# Patient Record
Sex: Male | Born: 1940 | Race: White | Hispanic: No | Marital: Married | Smoking: Never smoker
Health system: Southern US, Community
[De-identification: ages and names within clinical notes are randomized; demographics above are authoritative.]

## PROBLEM LIST (undated history)

## (undated) DIAGNOSIS — E7800 Pure hypercholesterolemia, unspecified: Secondary | ICD-10-CM

## (undated) DIAGNOSIS — L97921 Non-pressure chronic ulcer of unspecified part of left lower leg limited to breakdown of skin: Secondary | ICD-10-CM

## (undated) DIAGNOSIS — S81802D Unspecified open wound, left lower leg, subsequent encounter: Secondary | ICD-10-CM

## (undated) DIAGNOSIS — Z9289 Personal history of other medical treatment: Secondary | ICD-10-CM

## (undated) DIAGNOSIS — R609 Edema, unspecified: Secondary | ICD-10-CM

## (undated) DIAGNOSIS — I447 Left bundle-branch block, unspecified: Secondary | ICD-10-CM

## (undated) DIAGNOSIS — I10 Essential (primary) hypertension: Secondary | ICD-10-CM

## (undated) DIAGNOSIS — M545 Low back pain, unspecified: Secondary | ICD-10-CM

## (undated) DIAGNOSIS — M51369 Other intervertebral disc degeneration, lumbar region without mention of lumbar back pain or lower extremity pain: Secondary | ICD-10-CM

## (undated) DIAGNOSIS — I872 Venous insufficiency (chronic) (peripheral): Secondary | ICD-10-CM

## (undated) DIAGNOSIS — I4891 Unspecified atrial fibrillation: Secondary | ICD-10-CM

## (undated) DIAGNOSIS — IMO0001 Reserved for inherently not codable concepts without codable children: Secondary | ICD-10-CM

## (undated) DIAGNOSIS — N5082 Scrotal pain: Principal | ICD-10-CM

## (undated) DIAGNOSIS — M479 Spondylosis, unspecified: Secondary | ICD-10-CM

## (undated) DIAGNOSIS — M5136 Other intervertebral disc degeneration, lumbar region: Secondary | ICD-10-CM

## (undated) DIAGNOSIS — C801 Malignant (primary) neoplasm, unspecified: Secondary | ICD-10-CM

## (undated) DIAGNOSIS — I472 Ventricular tachycardia: Secondary | ICD-10-CM

## (undated) DIAGNOSIS — I2510 Atherosclerotic heart disease of native coronary artery without angina pectoris: Secondary | ICD-10-CM

## (undated) DIAGNOSIS — G4733 Obstructive sleep apnea (adult) (pediatric): Secondary | ICD-10-CM

## (undated) DIAGNOSIS — IMO0002 Reserved for concepts with insufficient information to code with codable children: Secondary | ICD-10-CM

## (undated) DIAGNOSIS — K5730 Diverticulosis of large intestine without perforation or abscess without bleeding: Secondary | ICD-10-CM

## (undated) DIAGNOSIS — K649 Unspecified hemorrhoids: Secondary | ICD-10-CM

## (undated) DIAGNOSIS — D1800 Hemangioma unspecified site: Secondary | ICD-10-CM

## (undated) DIAGNOSIS — D126 Benign neoplasm of colon, unspecified: Secondary | ICD-10-CM

## (undated) DIAGNOSIS — R05 Cough: Secondary | ICD-10-CM

## (undated) DIAGNOSIS — I679 Cerebrovascular disease, unspecified: Secondary | ICD-10-CM

## (undated) HISTORY — DX: Benign neoplasm of colon, unspecified: D12.6

## (undated) HISTORY — DX: Other intervertebral disc degeneration, lumbar region: M51.36

## (undated) HISTORY — DX: Edema, unspecified: R60.9

## (undated) HISTORY — DX: Pure hypercholesterolemia, unspecified: E78.00

## (undated) HISTORY — DX: Obstructive sleep apnea (adult) (pediatric): G47.33

## (undated) HISTORY — DX: Other intervertebral disc degeneration, lumbar region without mention of lumbar back pain or lower extremity pain: M51.369

## (undated) HISTORY — DX: Non-pressure chronic ulcer of unspecified part of left lower leg limited to breakdown of skin: L97.921

## (undated) HISTORY — DX: Low back pain: M54.5

## (undated) HISTORY — DX: Unspecified atrial fibrillation: I48.91

## (undated) HISTORY — DX: Scrotal pain: N50.82

## (undated) HISTORY — DX: Unspecified open wound, left lower leg, subsequent encounter: S81.802D

## (undated) HISTORY — DX: Cerebrovascular disease, unspecified: I67.9

## (undated) HISTORY — DX: Personal history of other medical treatment: Z92.89

## (undated) HISTORY — PX: COLONOSCOPY W/ BIOPSIES: SHX1374

## (undated) HISTORY — DX: Unspecified hemorrhoids: K64.9

## (undated) HISTORY — DX: Essential (primary) hypertension: I10

## (undated) HISTORY — PX: KNEE ARTHROSCOPY: SHX127

## (undated) HISTORY — DX: Cough: R05

## (undated) HISTORY — DX: Venous insufficiency (chronic) (peripheral): I87.2

## (undated) HISTORY — DX: Hemangioma unspecified site: D18.00

## (undated) HISTORY — DX: Diverticulosis of large intestine without perforation or abscess without bleeding: K57.30

## (undated) HISTORY — DX: Left bundle-branch block, unspecified: I44.7

## (undated) HISTORY — DX: Spondylosis, unspecified: M47.9

## (undated) HISTORY — DX: Atherosclerotic heart disease of native coronary artery without angina pectoris: I25.10

## (undated) HISTORY — DX: Ventricular tachycardia: I47.2

## (undated) HISTORY — DX: Reserved for concepts with insufficient information to code with codable children: IMO0002

## (undated) HISTORY — DX: Low back pain, unspecified: M54.50

---

## 2001-02-21 ENCOUNTER — Ambulatory Visit (HOSPITAL_COMMUNITY): Admission: RE | Admit: 2001-02-21 | Discharge: 2001-02-21 | Payer: Self-pay | Admitting: Cardiology

## 2001-12-13 DIAGNOSIS — D126 Benign neoplasm of colon, unspecified: Secondary | ICD-10-CM

## 2001-12-13 HISTORY — DX: Benign neoplasm of colon, unspecified: D12.6

## 2004-09-04 ENCOUNTER — Emergency Department (HOSPITAL_COMMUNITY): Admission: EM | Admit: 2004-09-04 | Discharge: 2004-09-04 | Payer: Self-pay

## 2005-03-02 ENCOUNTER — Ambulatory Visit: Payer: Self-pay | Admitting: Pulmonary Disease

## 2005-03-05 ENCOUNTER — Ambulatory Visit: Payer: Self-pay | Admitting: Pulmonary Disease

## 2005-06-05 ENCOUNTER — Encounter: Admission: RE | Admit: 2005-06-05 | Discharge: 2005-06-05 | Payer: Self-pay | Admitting: Neurosurgery

## 2005-06-13 ENCOUNTER — Encounter: Admission: RE | Admit: 2005-06-13 | Discharge: 2005-06-13 | Payer: Self-pay | Admitting: Neurosurgery

## 2005-07-07 ENCOUNTER — Ambulatory Visit (HOSPITAL_BASED_OUTPATIENT_CLINIC_OR_DEPARTMENT_OTHER): Admission: RE | Admit: 2005-07-07 | Discharge: 2005-07-07 | Payer: Self-pay | Admitting: Orthopedic Surgery

## 2005-07-07 ENCOUNTER — Ambulatory Visit (HOSPITAL_COMMUNITY): Admission: RE | Admit: 2005-07-07 | Discharge: 2005-07-07 | Payer: Self-pay | Admitting: Orthopedic Surgery

## 2005-12-08 ENCOUNTER — Ambulatory Visit: Payer: Self-pay | Admitting: Internal Medicine

## 2006-01-18 ENCOUNTER — Ambulatory Visit: Payer: Self-pay | Admitting: Pulmonary Disease

## 2006-02-03 ENCOUNTER — Ambulatory Visit: Payer: Self-pay | Admitting: Pulmonary Disease

## 2006-02-10 ENCOUNTER — Ambulatory Visit: Payer: Self-pay | Admitting: Internal Medicine

## 2006-08-13 HISTORY — PX: CARPAL TUNNEL RELEASE: SHX101

## 2006-10-10 ENCOUNTER — Ambulatory Visit: Payer: Self-pay | Admitting: Pulmonary Disease

## 2007-02-13 ENCOUNTER — Ambulatory Visit: Payer: Self-pay | Admitting: Pulmonary Disease

## 2007-02-13 LAB — CONVERTED CEMR LAB
AST: 17 units/L (ref 0–37)
Alkaline Phosphatase: 86 units/L (ref 39–117)
Basophils Absolute: 0 10*3/uL (ref 0.0–0.1)
Bilirubin, Direct: 0.2 mg/dL (ref 0.0–0.3)
Chloride: 107 meq/L (ref 96–112)
Creatinine, Ser: 0.9 mg/dL (ref 0.4–1.5)
Eosinophils Relative: 1.9 % (ref 0.0–5.0)
GFR calc non Af Amer: 89 mL/min
Glucose, Bld: 100 mg/dL — ABNORMAL HIGH (ref 70–99)
HCT: 45.7 % (ref 39.0–52.0)
Hemoglobin: 15.6 g/dL (ref 13.0–17.0)
Ketones, ur: NEGATIVE mg/dL
LDL Cholesterol: 119 mg/dL — ABNORMAL HIGH (ref 0–99)
MCV: 89.7 fL (ref 78.0–100.0)
Neutro Abs: 8.9 10*3/uL — ABNORMAL HIGH (ref 1.4–7.7)
Nitrite: NEGATIVE
PSA: 1.83 ng/mL (ref 0.10–4.00)
RBC: 5.09 M/uL (ref 4.22–5.81)
RDW: 12.6 % (ref 11.5–14.6)
Sodium: 142 meq/L (ref 135–145)
Specific Gravity, Urine: >=1.03 (ref 1.000–1.03)
TSH: 1.31 microintl units/mL (ref 0.35–5.50)
Total Bilirubin: 1.2 mg/dL (ref 0.3–1.2)
Total CHOL/HDL Ratio: 4.4
Total Protein: 6.3 g/dL (ref 6.0–8.3)
Triglycerides: 108 mg/dL (ref 0–149)
Urine Glucose: NEGATIVE mg/dL
Urobilinogen, UA: 0.2 (ref 0.0–1.0)
VLDL: 22 mg/dL (ref 0–40)

## 2007-09-14 ENCOUNTER — Ambulatory Visit: Payer: Self-pay | Admitting: Pulmonary Disease

## 2007-10-31 ENCOUNTER — Ambulatory Visit: Payer: Self-pay | Admitting: Pulmonary Disease

## 2007-12-26 DIAGNOSIS — K649 Unspecified hemorrhoids: Secondary | ICD-10-CM | POA: Insufficient documentation

## 2007-12-26 DIAGNOSIS — D126 Benign neoplasm of colon, unspecified: Secondary | ICD-10-CM

## 2007-12-26 DIAGNOSIS — I10 Essential (primary) hypertension: Secondary | ICD-10-CM

## 2007-12-26 DIAGNOSIS — E669 Obesity, unspecified: Secondary | ICD-10-CM

## 2007-12-26 DIAGNOSIS — M1990 Unspecified osteoarthritis, unspecified site: Secondary | ICD-10-CM | POA: Insufficient documentation

## 2007-12-26 DIAGNOSIS — I2510 Atherosclerotic heart disease of native coronary artery without angina pectoris: Secondary | ICD-10-CM

## 2007-12-26 DIAGNOSIS — E7800 Pure hypercholesterolemia, unspecified: Secondary | ICD-10-CM | POA: Insufficient documentation

## 2007-12-27 ENCOUNTER — Ambulatory Visit: Payer: Self-pay | Admitting: Pulmonary Disease

## 2007-12-27 DIAGNOSIS — K5730 Diverticulosis of large intestine without perforation or abscess without bleeding: Secondary | ICD-10-CM | POA: Insufficient documentation

## 2007-12-31 DIAGNOSIS — M479 Spondylosis, unspecified: Secondary | ICD-10-CM | POA: Insufficient documentation

## 2007-12-31 DIAGNOSIS — I679 Cerebrovascular disease, unspecified: Secondary | ICD-10-CM

## 2007-12-31 DIAGNOSIS — D1809 Hemangioma of other sites: Secondary | ICD-10-CM

## 2007-12-31 LAB — CONVERTED CEMR LAB
AST: 16 units/L (ref 0–37)
Albumin: 3.6 g/dL (ref 3.5–5.2)
Bacteria, UA: NEGATIVE
CO2: 28 meq/L (ref 19–32)
Cholesterol: 153 mg/dL (ref 0–200)
Creatinine, Ser: 0.9 mg/dL (ref 0.4–1.5)
Crystals: NEGATIVE
GFR calc Af Amer: 108 mL/min
Glucose, Bld: 106 mg/dL — ABNORMAL HIGH (ref 70–99)
HDL: 35.3 mg/dL — ABNORMAL LOW (ref >39.0)
Hemoglobin: 15.3 g/dL (ref 13.0–17.0)
LDL Cholesterol: 103 mg/dL — ABNORMAL HIGH (ref 0–99)
Lymphocytes Relative: 8.3 % — ABNORMAL LOW (ref 12.0–46.0)
MCV: 90.3 fL (ref 78.0–100.0)
Monocytes Absolute: 2.2 10*3/uL — ABNORMAL HIGH (ref 0.2–0.7)
Neutro Abs: 10.5 10*3/uL — ABNORMAL HIGH (ref 1.4–7.7)
Nitrite: NEGATIVE
PSA: 1.03 ng/mL (ref 0.10–4.00)
Potassium: 5.3 meq/L — ABNORMAL HIGH (ref 3.5–5.1)
RDW: 12.9 % (ref 11.5–14.6)
Sodium: 140 meq/L (ref 135–145)
Total Protein: 5.9 g/dL — ABNORMAL LOW (ref 6.0–8.3)
Urine Glucose: NEGATIVE mg/dL
WBC: 14.1 10*3/uL — ABNORMAL HIGH (ref 4.5–10.5)
pH: 6.5 (ref 5.0–8.0)

## 2008-02-09 ENCOUNTER — Telehealth: Payer: Self-pay | Admitting: Pulmonary Disease

## 2008-04-05 ENCOUNTER — Ambulatory Visit: Payer: Self-pay | Admitting: Internal Medicine

## 2008-04-13 ENCOUNTER — Emergency Department (HOSPITAL_COMMUNITY): Admission: EM | Admit: 2008-04-13 | Discharge: 2008-04-13 | Payer: Self-pay | Admitting: Emergency Medicine

## 2008-04-14 ENCOUNTER — Ambulatory Visit (HOSPITAL_COMMUNITY): Admission: RE | Admit: 2008-04-14 | Discharge: 2008-04-14 | Payer: Self-pay | Admitting: Emergency Medicine

## 2008-04-14 ENCOUNTER — Encounter (INDEPENDENT_AMBULATORY_CARE_PROVIDER_SITE_OTHER): Payer: Self-pay | Admitting: Emergency Medicine

## 2008-04-14 ENCOUNTER — Ambulatory Visit: Payer: Self-pay | Admitting: Surgery

## 2008-04-22 ENCOUNTER — Encounter: Admission: RE | Admit: 2008-04-22 | Discharge: 2008-04-22 | Payer: Self-pay | Admitting: Orthopedic Surgery

## 2008-08-02 ENCOUNTER — Telehealth (INDEPENDENT_AMBULATORY_CARE_PROVIDER_SITE_OTHER): Payer: Self-pay | Admitting: *Deleted

## 2008-09-13 ENCOUNTER — Encounter: Admission: RE | Admit: 2008-09-13 | Discharge: 2008-09-13 | Payer: Self-pay | Admitting: Orthopedic Surgery

## 2008-10-30 ENCOUNTER — Encounter: Payer: Self-pay | Admitting: Pulmonary Disease

## 2008-11-20 ENCOUNTER — Encounter: Payer: Self-pay | Admitting: Pulmonary Disease

## 2008-12-02 ENCOUNTER — Telehealth (INDEPENDENT_AMBULATORY_CARE_PROVIDER_SITE_OTHER): Payer: Self-pay | Admitting: *Deleted

## 2009-01-13 ENCOUNTER — Ambulatory Visit: Payer: Self-pay | Admitting: Pulmonary Disease

## 2009-01-13 DIAGNOSIS — M545 Low back pain: Secondary | ICD-10-CM

## 2009-01-15 ENCOUNTER — Ambulatory Visit: Payer: Self-pay | Admitting: Pulmonary Disease

## 2009-01-15 LAB — CONVERTED CEMR LAB
Fecal Occult Blood: NEGATIVE
OCCULT 1: NEGATIVE
OCCULT 3: NEGATIVE

## 2009-01-18 LAB — CONVERTED CEMR LAB
Albumin: 3.3 g/dL — ABNORMAL LOW (ref 3.5–5.2)
Alkaline Phosphatase: 70 units/L (ref 39–117)
Basophils Relative: 0 % (ref 0.0–3.0)
CO2: 28 meq/L (ref 19–32)
Calcium: 9.2 mg/dL (ref 8.4–10.5)
Cholesterol: 155 mg/dL (ref 0–200)
Creatinine, Ser: 0.8 mg/dL (ref 0.4–1.5)
Eosinophils Absolute: 0.1 10*3/uL (ref 0.0–0.7)
GFR calc Af Amer: 123 mL/min
Glucose, Bld: 101 mg/dL — ABNORMAL HIGH (ref 70–99)
Lymphocytes Relative: 13.6 % (ref 12.0–46.0)
MCHC: 33.4 g/dL (ref 30.0–36.0)
MCV: 91.7 fL (ref 78.0–100.0)
Monocytes Absolute: 0.5 10*3/uL (ref 0.1–1.0)
Monocytes Relative: 3.8 % (ref 3.0–12.0)
Neutro Abs: 10.3 10*3/uL — ABNORMAL HIGH (ref 1.4–7.7)
PSA: 0.81 ng/mL (ref 0.10–4.00)
Potassium: 4.7 meq/L (ref 3.5–5.1)
Sodium: 142 meq/L (ref 135–145)
TSH: 1.26 microintl units/mL (ref 0.35–5.50)
Total Bilirubin: 0.9 mg/dL (ref 0.3–1.2)
Total Protein: 5.6 g/dL — ABNORMAL LOW (ref 6.0–8.3)
Urine Glucose: NEGATIVE mg/dL
VLDL: 15 mg/dL (ref 0–40)

## 2009-02-03 ENCOUNTER — Encounter: Payer: Self-pay | Admitting: Pulmonary Disease

## 2009-05-21 ENCOUNTER — Encounter: Payer: Self-pay | Admitting: Pulmonary Disease

## 2009-06-19 ENCOUNTER — Encounter: Payer: Self-pay | Admitting: Pulmonary Disease

## 2009-08-12 ENCOUNTER — Telehealth: Payer: Self-pay | Admitting: Internal Medicine

## 2009-08-14 ENCOUNTER — Ambulatory Visit: Payer: Self-pay | Admitting: Internal Medicine

## 2009-08-14 DIAGNOSIS — K921 Melena: Secondary | ICD-10-CM | POA: Insufficient documentation

## 2009-08-19 ENCOUNTER — Ambulatory Visit: Payer: Self-pay | Admitting: Internal Medicine

## 2009-09-19 ENCOUNTER — Ambulatory Visit: Payer: Self-pay | Admitting: Pulmonary Disease

## 2009-09-19 DIAGNOSIS — R079 Chest pain, unspecified: Secondary | ICD-10-CM | POA: Insufficient documentation

## 2009-09-24 ENCOUNTER — Ambulatory Visit: Payer: Self-pay | Admitting: Cardiology

## 2009-09-24 ENCOUNTER — Encounter: Payer: Self-pay | Admitting: Cardiology

## 2009-09-29 LAB — CONVERTED CEMR LAB: Pro B Natriuretic peptide (BNP): 119.9 pg/mL — ABNORMAL HIGH (ref 0.0–100.0)

## 2009-10-08 ENCOUNTER — Telehealth (INDEPENDENT_AMBULATORY_CARE_PROVIDER_SITE_OTHER): Payer: Self-pay | Admitting: *Deleted

## 2009-10-09 ENCOUNTER — Encounter: Payer: Self-pay | Admitting: Cardiology

## 2009-10-09 ENCOUNTER — Ambulatory Visit: Payer: Self-pay

## 2009-10-09 ENCOUNTER — Ambulatory Visit: Payer: Self-pay | Admitting: Cardiovascular Disease

## 2009-10-09 ENCOUNTER — Ambulatory Visit (HOSPITAL_COMMUNITY): Admission: RE | Admit: 2009-10-09 | Discharge: 2009-10-09 | Payer: Self-pay | Admitting: Cardiology

## 2009-10-09 ENCOUNTER — Encounter (HOSPITAL_COMMUNITY): Admission: RE | Admit: 2009-10-09 | Discharge: 2009-12-12 | Payer: Self-pay | Admitting: Cardiology

## 2009-10-15 ENCOUNTER — Encounter: Payer: Self-pay | Admitting: Cardiology

## 2009-10-15 ENCOUNTER — Ambulatory Visit (HOSPITAL_BASED_OUTPATIENT_CLINIC_OR_DEPARTMENT_OTHER): Admission: RE | Admit: 2009-10-15 | Discharge: 2009-10-15 | Payer: Self-pay | Admitting: Cardiology

## 2009-10-15 ENCOUNTER — Ambulatory Visit: Payer: Self-pay | Admitting: Interventional Radiology

## 2009-10-15 ENCOUNTER — Encounter (INDEPENDENT_AMBULATORY_CARE_PROVIDER_SITE_OTHER): Payer: Self-pay | Admitting: *Deleted

## 2009-10-15 ENCOUNTER — Ambulatory Visit: Payer: Self-pay | Admitting: Cardiology

## 2009-10-16 ENCOUNTER — Inpatient Hospital Stay (HOSPITAL_BASED_OUTPATIENT_CLINIC_OR_DEPARTMENT_OTHER): Admission: RE | Admit: 2009-10-16 | Discharge: 2009-10-16 | Payer: Self-pay | Admitting: Cardiovascular Disease

## 2009-10-16 ENCOUNTER — Ambulatory Visit: Payer: Self-pay | Admitting: Cardiovascular Disease

## 2009-10-17 LAB — CONVERTED CEMR LAB
CO2: 25 meq/L (ref 19–32)
Calcium: 9.5 mg/dL (ref 8.4–10.5)
Eosinophils Relative: 1 % (ref 0–5)
HCT: 45.4 % (ref 39.0–52.0)
Hemoglobin: 14.4 g/dL (ref 13.0–17.0)
Lymphs Abs: 1.7 10*3/uL (ref 0.7–4.0)
Neutro Abs: 10.6 10*3/uL — ABNORMAL HIGH (ref 1.7–7.7)
Potassium: 4.3 meq/L (ref 3.5–5.3)
RBC: 4.76 M/uL (ref 4.22–5.81)
Sodium: 144 meq/L (ref 135–145)
WBC: 13.6 10*3/uL — ABNORMAL HIGH (ref 4.0–10.5)

## 2009-10-20 ENCOUNTER — Telehealth: Payer: Self-pay | Admitting: Cardiology

## 2009-10-20 ENCOUNTER — Ambulatory Visit: Payer: Self-pay | Admitting: Pulmonary Disease

## 2009-10-20 DIAGNOSIS — R0602 Shortness of breath: Secondary | ICD-10-CM

## 2009-10-21 ENCOUNTER — Ambulatory Visit: Payer: Self-pay | Admitting: Cardiology

## 2009-10-23 ENCOUNTER — Ambulatory Visit (HOSPITAL_COMMUNITY): Admission: RE | Admit: 2009-10-23 | Discharge: 2009-10-23 | Payer: Self-pay | Admitting: Pulmonary Disease

## 2009-10-29 ENCOUNTER — Telehealth: Payer: Self-pay | Admitting: Pulmonary Disease

## 2009-10-29 ENCOUNTER — Encounter: Payer: Self-pay | Admitting: Cardiology

## 2009-10-29 ENCOUNTER — Ambulatory Visit: Payer: Self-pay | Admitting: Cardiology

## 2009-11-11 ENCOUNTER — Encounter: Payer: Self-pay | Admitting: Cardiology

## 2009-11-11 ENCOUNTER — Ambulatory Visit (HOSPITAL_BASED_OUTPATIENT_CLINIC_OR_DEPARTMENT_OTHER): Admission: RE | Admit: 2009-11-11 | Discharge: 2009-11-11 | Payer: Self-pay | Admitting: Pulmonary Disease

## 2009-11-11 ENCOUNTER — Encounter: Payer: Self-pay | Admitting: Pulmonary Disease

## 2009-11-11 ENCOUNTER — Encounter: Payer: Self-pay | Admitting: Internal Medicine

## 2009-11-14 DIAGNOSIS — D72829 Elevated white blood cell count, unspecified: Secondary | ICD-10-CM | POA: Insufficient documentation

## 2009-11-14 LAB — CONVERTED CEMR LAB
Albumin ELP: 61.9 % (ref 55.8–66.1)
Albumin: 3.7 g/dL (ref 3.5–5.2)
Alkaline Phosphatase: 87 units/L (ref 39–117)
Alpha-1-Globulin: 5 % — ABNORMAL HIGH (ref 2.9–4.9)
BUN: 16 mg/dL (ref 6–23)
Calcium: 8.8 mg/dL (ref 8.4–10.5)
Chloride: 107 meq/L (ref 96–112)
Ferritin: 141 ng/mL (ref 22–322)
Gamma Globulin: 7.5 % — ABNORMAL LOW (ref 11.1–18.8)
Glucose, Bld: 118 mg/dL — ABNORMAL HIGH (ref 70–99)
HCT: 42.3 % (ref 39.0–52.0)
Hemoglobin: 13.8 g/dL (ref 13.0–17.0)
Leukocytes, UA: NEGATIVE
MCHC: 32.6 g/dL (ref 30.0–36.0)
Nitrite: NEGATIVE
Platelets: 236 10*3/uL (ref 150–400)
Protein, ur: NEGATIVE mg/dL
RBC: 4.6 M/uL (ref 4.22–5.81)
RDW: 13.3 % (ref 11.5–15.5)
Sodium: 143 meq/L (ref 135–145)
Total Bilirubin: 0.8 mg/dL (ref 0.3–1.2)
Total Protein: 5.3 g/dL — ABNORMAL LOW (ref 6.0–8.3)
Urobilinogen, UA: 0.2 (ref 0.0–1.0)
WBC: 13.4 10*3/uL — ABNORMAL HIGH (ref 4.0–10.5)
pH: 5.5 (ref 5.0–8.0)

## 2009-11-18 ENCOUNTER — Ambulatory Visit: Payer: Self-pay | Admitting: Oncology

## 2009-11-24 LAB — CONVERTED CEMR LAB
Alpha 1, Urine: DETECTED % — AB
Free Kappa Lt Chains,Ur: 297 mg/dL — ABNORMAL HIGH (ref 0.04–1.51)
Free Kappa/Lambda Ratio: 2475 — ABNORMAL HIGH (ref 0.46–4.00)
Free Lambda Lt Chains,Ur: 0.12 mg/dL (ref 0.08–1.01)
Time: 24

## 2009-11-26 ENCOUNTER — Telehealth (INDEPENDENT_AMBULATORY_CARE_PROVIDER_SITE_OTHER): Payer: Self-pay | Admitting: *Deleted

## 2009-11-27 ENCOUNTER — Ambulatory Visit: Payer: Self-pay | Admitting: Pulmonary Disease

## 2009-11-28 ENCOUNTER — Telehealth: Payer: Self-pay | Admitting: Pulmonary Disease

## 2009-11-28 ENCOUNTER — Ambulatory Visit: Payer: Self-pay | Admitting: Cardiology

## 2009-12-01 ENCOUNTER — Telehealth (INDEPENDENT_AMBULATORY_CARE_PROVIDER_SITE_OTHER): Payer: Self-pay | Admitting: *Deleted

## 2009-12-01 ENCOUNTER — Encounter: Payer: Self-pay | Admitting: Cardiology

## 2009-12-01 LAB — CBC WITH DIFFERENTIAL/PLATELET
BASO%: 0.7 % (ref 0.0–2.0)
HCT: 45.4 % (ref 38.4–49.9)
HGB: 15 g/dL (ref 13.0–17.1)
MCHC: 33 g/dL (ref 32.0–36.0)
MONO#: 0.8 10*3/uL (ref 0.1–0.9)
NEUT%: 79.1 % — ABNORMAL HIGH (ref 39.0–75.0)
WBC: 13.3 10*3/uL — ABNORMAL HIGH (ref 4.0–10.3)
lymph#: 1.8 10*3/uL (ref 0.9–3.3)

## 2009-12-01 LAB — MORPHOLOGY: PLT EST: ADEQUATE

## 2009-12-01 LAB — CHCC SMEAR

## 2009-12-02 ENCOUNTER — Other Ambulatory Visit: Admission: RE | Admit: 2009-12-02 | Discharge: 2009-12-02 | Payer: Self-pay | Admitting: Oncology

## 2009-12-02 ENCOUNTER — Ambulatory Visit (HOSPITAL_COMMUNITY): Admission: RE | Admit: 2009-12-02 | Discharge: 2009-12-02 | Payer: Self-pay | Admitting: Oncology

## 2009-12-02 LAB — FLOW CYTOMETRY

## 2009-12-03 LAB — COMPREHENSIVE METABOLIC PANEL WITH GFR
ALT: 13 U/L (ref 0–53)
AST: 13 U/L (ref 0–37)
Albumin: 3.7 g/dL (ref 3.5–5.2)
Alkaline Phosphatase: 97 U/L (ref 39–117)
BUN: 20 mg/dL (ref 6–23)
CO2: 29 meq/L (ref 19–32)
Calcium: 9.4 mg/dL (ref 8.4–10.5)
Chloride: 105 meq/L (ref 96–112)
Creatinine, Ser: 0.91 mg/dL (ref 0.40–1.50)
Glucose, Bld: 117 mg/dL — ABNORMAL HIGH (ref 70–99)
Potassium: 4.4 meq/L (ref 3.5–5.3)
Sodium: 143 meq/L (ref 135–145)
Total Bilirubin: 0.7 mg/dL (ref 0.3–1.2)
Total Protein: 5.7 g/dL — ABNORMAL LOW (ref 6.0–8.3)

## 2009-12-03 LAB — SEDIMENTATION RATE: Sed Rate: 5 mm/h (ref 0–16)

## 2009-12-03 LAB — KAPPA/LAMBDA LIGHT CHAINS

## 2009-12-03 LAB — PROTEIN ELECTROPHORESIS, SERUM
Beta 2: 4.6 % (ref 3.2–6.5)
Beta Globulin: 5.9 % (ref 4.7–7.2)
Gamma Globulin: 7.3 % — ABNORMAL LOW (ref 11.1–18.8)

## 2009-12-10 ENCOUNTER — Encounter: Payer: Self-pay | Admitting: Pulmonary Disease

## 2009-12-15 ENCOUNTER — Ambulatory Visit: Payer: Self-pay | Admitting: Oncology

## 2009-12-18 ENCOUNTER — Telehealth: Payer: Self-pay | Admitting: Cardiology

## 2009-12-19 ENCOUNTER — Ambulatory Visit: Payer: Self-pay | Admitting: Cardiology

## 2009-12-19 DIAGNOSIS — E859 Amyloidosis, unspecified: Secondary | ICD-10-CM | POA: Insufficient documentation

## 2010-01-05 ENCOUNTER — Encounter: Payer: Self-pay | Admitting: Cardiology

## 2010-01-05 ENCOUNTER — Encounter (INDEPENDENT_AMBULATORY_CARE_PROVIDER_SITE_OTHER): Payer: Self-pay | Admitting: *Deleted

## 2010-01-05 LAB — CBC WITH DIFFERENTIAL/PLATELET
Basophils Absolute: 0.1 10*3/uL (ref 0.0–0.1)
Eosinophils Absolute: 0 10*3/uL (ref 0.0–0.5)
LYMPH%: 11.7 % — ABNORMAL LOW (ref 14.0–49.0)
MCHC: 32.9 g/dL (ref 32.0–36.0)
MCV: 92.3 fL (ref 79.3–98.0)
NEUT%: 81.9 % — ABNORMAL HIGH (ref 39.0–75.0)
RDW: 13.5 % (ref 11.0–14.6)
WBC: 18.9 10*3/uL — ABNORMAL HIGH (ref 4.0–10.3)

## 2010-01-05 LAB — BASIC METABOLIC PANEL
BUN: 26 mg/dL — ABNORMAL HIGH (ref 6–23)
Calcium: 9.2 mg/dL (ref 8.4–10.5)
Creatinine, Ser: 0.87 mg/dL (ref 0.40–1.50)
Glucose, Bld: 99 mg/dL (ref 70–99)
Potassium: 4.5 mEq/L (ref 3.5–5.3)

## 2010-01-12 ENCOUNTER — Encounter (INDEPENDENT_AMBULATORY_CARE_PROVIDER_SITE_OTHER): Payer: Self-pay | Admitting: *Deleted

## 2010-01-12 ENCOUNTER — Encounter: Payer: Self-pay | Admitting: Cardiology

## 2010-01-12 LAB — CBC WITH DIFFERENTIAL/PLATELET
Basophils Absolute: 0 10*3/uL (ref 0.0–0.1)
EOS%: 0.6 % (ref 0.0–7.0)
Eosinophils Absolute: 0.1 10*3/uL (ref 0.0–0.5)
HCT: 43.6 % (ref 38.4–49.9)
HGB: 14.4 g/dL (ref 13.0–17.1)
MCV: 92 fL (ref 79.3–98.0)
MONO#: 0.7 10*3/uL (ref 0.1–0.9)
NEUT#: 15.9 10*3/uL — ABNORMAL HIGH (ref 1.5–6.5)
Platelets: 186 10*3/uL (ref 140–400)
RBC: 4.74 10*6/uL (ref 4.20–5.82)
RDW: 13.2 % (ref 11.0–14.6)
lymph#: 1.9 10*3/uL (ref 0.9–3.3)

## 2010-01-12 LAB — COMPREHENSIVE METABOLIC PANEL
Alkaline Phosphatase: 76 U/L (ref 39–117)
CO2: 25 mEq/L (ref 19–32)
Calcium: 8.2 mg/dL — ABNORMAL LOW (ref 8.4–10.5)
Chloride: 109 mEq/L (ref 96–112)
Potassium: 4.3 mEq/L (ref 3.5–5.3)
Sodium: 142 mEq/L (ref 135–145)
Total Bilirubin: 0.5 mg/dL (ref 0.3–1.2)
Total Protein: 4.9 g/dL — ABNORMAL LOW (ref 6.0–8.3)

## 2010-01-15 ENCOUNTER — Ambulatory Visit: Payer: Self-pay | Admitting: Oncology

## 2010-01-19 ENCOUNTER — Encounter: Payer: Self-pay | Admitting: Cardiology

## 2010-01-19 LAB — COMPREHENSIVE METABOLIC PANEL
ALT: 17 U/L (ref 0–53)
AST: 18 U/L (ref 0–37)
Albumin: 3.6 g/dL (ref 3.5–5.2)
Alkaline Phosphatase: 78 U/L (ref 39–117)
BUN: 16 mg/dL (ref 6–23)
CO2: 23 mEq/L (ref 19–32)
Calcium: 9.4 mg/dL (ref 8.4–10.5)
Chloride: 106 mEq/L (ref 96–112)
Creatinine, Ser: 0.81 mg/dL (ref 0.40–1.50)
Glucose, Bld: 126 mg/dL — ABNORMAL HIGH (ref 70–99)
Potassium: 4.4 mEq/L (ref 3.5–5.3)
Sodium: 141 mEq/L (ref 135–145)
Total Bilirubin: 0.5 mg/dL (ref 0.3–1.2)
Total Protein: 5.6 g/dL — ABNORMAL LOW (ref 6.0–8.3)

## 2010-01-19 LAB — CBC WITH DIFFERENTIAL/PLATELET
Basophils Absolute: 0 10*3/uL (ref 0.0–0.1)
Eosinophils Absolute: 0 10*3/uL (ref 0.0–0.5)
HCT: 43.2 % (ref 38.4–49.9)
HGB: 14.1 g/dL (ref 13.0–17.1)
MONO#: 0.1 10*3/uL (ref 0.1–0.9)
NEUT%: 95.7 % — ABNORMAL HIGH (ref 39.0–75.0)
WBC: 15.7 10*3/uL — ABNORMAL HIGH (ref 4.0–10.3)
lymph#: 0.6 10*3/uL — ABNORMAL LOW (ref 0.9–3.3)

## 2010-01-26 LAB — CBC WITH DIFFERENTIAL/PLATELET
BASO%: 0.1 % (ref 0.0–2.0)
Basophils Absolute: 0 10*3/uL (ref 0.0–0.1)
EOS%: 0 % (ref 0.0–7.0)
HGB: 14.3 g/dL (ref 13.0–17.1)
MCH: 30.4 pg (ref 27.2–33.4)
RDW: 13.9 % (ref 11.0–14.6)
lymph#: 0.6 10*3/uL — ABNORMAL LOW (ref 0.9–3.3)
nRBC: 0 % (ref 0–0)

## 2010-01-27 ENCOUNTER — Ambulatory Visit: Payer: Self-pay | Admitting: Cardiology

## 2010-01-27 ENCOUNTER — Ambulatory Visit (HOSPITAL_COMMUNITY): Admission: RE | Admit: 2010-01-27 | Discharge: 2010-01-27 | Payer: Self-pay | Admitting: Cardiology

## 2010-01-28 LAB — COMPREHENSIVE METABOLIC PANEL
ALT: 14 U/L (ref 0–53)
AST: 11 U/L (ref 0–37)
Albumin: 4.1 g/dL (ref 3.5–5.2)
Alkaline Phosphatase: 77 U/L (ref 39–117)
Glucose, Bld: 173 mg/dL — ABNORMAL HIGH (ref 70–99)
Potassium: 4.4 mEq/L (ref 3.5–5.3)
Sodium: 139 mEq/L (ref 135–145)
Total Protein: 6 g/dL (ref 6.0–8.3)

## 2010-02-02 LAB — CBC WITH DIFFERENTIAL/PLATELET
Basophils Absolute: 0 10*3/uL (ref 0.0–0.1)
EOS%: 0 % (ref 0.0–7.0)
Eosinophils Absolute: 0 10*3/uL (ref 0.0–0.5)
HCT: 41.8 % (ref 38.4–49.9)
HGB: 13.9 g/dL (ref 13.0–17.1)
MCH: 30.7 pg (ref 27.2–33.4)
MCV: 92.3 fL (ref 79.3–98.0)
MONO%: 0.1 % (ref 0.0–14.0)
NEUT%: 95.6 % — ABNORMAL HIGH (ref 39.0–75.0)
Platelets: 150 10*3/uL (ref 140–400)

## 2010-02-02 LAB — MAGNESIUM: Magnesium: 1.9 mg/dL (ref 1.5–2.5)

## 2010-02-02 LAB — COMPREHENSIVE METABOLIC PANEL
AST: 11 U/L (ref 0–37)
Alkaline Phosphatase: 82 U/L (ref 39–117)
BUN: 20 mg/dL (ref 6–23)
Calcium: 8.3 mg/dL — ABNORMAL LOW (ref 8.4–10.5)
Creatinine, Ser: 0.79 mg/dL (ref 0.40–1.50)
Glucose, Bld: 157 mg/dL — ABNORMAL HIGH (ref 70–99)

## 2010-02-09 LAB — BASIC METABOLIC PANEL
CO2: 25 mEq/L (ref 19–32)
Calcium: 9.4 mg/dL (ref 8.4–10.5)
Creatinine, Ser: 0.85 mg/dL (ref 0.40–1.50)

## 2010-02-09 LAB — CBC WITH DIFFERENTIAL/PLATELET
Basophils Absolute: 0 10*3/uL (ref 0.0–0.1)
EOS%: 0 % (ref 0.0–7.0)
HGB: 13.5 g/dL (ref 13.0–17.1)
MCH: 31 pg (ref 27.2–33.4)
NEUT#: 9.9 10*3/uL — ABNORMAL HIGH (ref 1.5–6.5)
RDW: 14.6 % (ref 11.0–14.6)
lymph#: 0.4 10*3/uL — ABNORMAL LOW (ref 0.9–3.3)

## 2010-02-16 ENCOUNTER — Ambulatory Visit: Payer: Self-pay | Admitting: Oncology

## 2010-02-16 LAB — CBC WITH DIFFERENTIAL/PLATELET
Eosinophils Absolute: 0 10*3/uL (ref 0.0–0.5)
HCT: 42 % (ref 38.4–49.9)
LYMPH%: 4.4 % — ABNORMAL LOW (ref 14.0–49.0)
MCV: 92.9 fL (ref 79.3–98.0)
MONO%: 0.2 % (ref 0.0–14.0)
NEUT#: 9.3 10*3/uL — ABNORMAL HIGH (ref 1.5–6.5)
NEUT%: 95.4 % — ABNORMAL HIGH (ref 39.0–75.0)
Platelets: 232 10*3/uL (ref 140–400)
RBC: 4.52 10*6/uL (ref 4.20–5.82)

## 2010-02-16 LAB — BASIC METABOLIC PANEL
BUN: 19 mg/dL (ref 6–23)
Chloride: 103 mEq/L (ref 96–112)
Glucose, Bld: 182 mg/dL — ABNORMAL HIGH (ref 70–99)
Potassium: 5.3 mEq/L (ref 3.5–5.3)

## 2010-02-23 LAB — CBC WITH DIFFERENTIAL/PLATELET
BASO%: 0.2 % (ref 0.0–2.0)
EOS%: 0.1 % (ref 0.0–7.0)
LYMPH%: 6 % — ABNORMAL LOW (ref 14.0–49.0)
MCH: 31.4 pg (ref 27.2–33.4)
MCHC: 33.3 g/dL (ref 32.0–36.0)
MCV: 94.2 fL (ref 79.3–98.0)
MONO#: 0.2 10*3/uL (ref 0.1–0.9)
MONO%: 1.5 % (ref 0.0–14.0)
NEUT%: 92.2 % — ABNORMAL HIGH (ref 39.0–75.0)
Platelets: 180 10*3/uL (ref 140–400)
RBC: 4.52 10*6/uL (ref 4.20–5.82)
WBC: 10.8 10*3/uL — ABNORMAL HIGH (ref 4.0–10.3)
nRBC: 0 % (ref 0–0)

## 2010-02-25 LAB — COMPREHENSIVE METABOLIC PANEL
ALT: 15 U/L (ref 0–53)
AST: 13 U/L (ref 0–37)
Alkaline Phosphatase: 70 U/L (ref 39–117)
BUN: 22 mg/dL (ref 6–23)
Creatinine, Ser: 0.82 mg/dL (ref 0.40–1.50)
Total Bilirubin: 0.5 mg/dL (ref 0.3–1.2)

## 2010-02-25 LAB — PROTEIN ELECTROPHORESIS, SERUM
Alpha-1-Globulin: 7.9 % — ABNORMAL HIGH (ref 2.9–4.9)
Beta 2: 3.3 % (ref 3.2–6.5)
Gamma Globulin: 6.5 % — ABNORMAL LOW (ref 11.1–18.8)

## 2010-02-25 LAB — IGG, IGA, IGM: IgM, Serum: 15 mg/dL — ABNORMAL LOW (ref 60–263)

## 2010-02-27 LAB — UIFE/LIGHT CHAINS/TP QN, 24-HR UR
Albumin, U: DETECTED
Alpha 1, Urine: DETECTED — AB
Beta, Urine: DETECTED — AB
Free Lambda Excretion/Day: 2.09 mg/d
Free Lambda Lt Chains,Ur: 0.09 mg/dL (ref 0.08–1.01)
Total Protein, Urine-Ur/day: 163 mg/d — ABNORMAL HIGH (ref 10–140)
Volume, Urine: 2325 mL

## 2010-03-09 LAB — CBC WITH DIFFERENTIAL/PLATELET
BASO%: 0.5 % (ref 0.0–2.0)
HCT: 40.8 % (ref 38.4–49.9)
MCHC: 33.3 g/dL (ref 32.0–36.0)
MONO#: 1.1 10*3/uL — ABNORMAL HIGH (ref 0.1–0.9)
RBC: 4.31 10*6/uL (ref 4.20–5.82)
WBC: 9.8 10*3/uL (ref 4.0–10.3)
lymph#: 1.5 10*3/uL (ref 0.9–3.3)

## 2010-03-16 LAB — CBC WITH DIFFERENTIAL/PLATELET
Basophils Absolute: 0 10*3/uL (ref 0.0–0.1)
Eosinophils Absolute: 0 10*3/uL (ref 0.0–0.5)
HGB: 15.3 g/dL (ref 13.0–17.1)
NEUT#: 12.6 10*3/uL — ABNORMAL HIGH (ref 1.5–6.5)
RDW: 14.8 % — ABNORMAL HIGH (ref 11.0–14.6)
lymph#: 0.7 10*3/uL — ABNORMAL LOW (ref 0.9–3.3)

## 2010-03-16 LAB — COMPREHENSIVE METABOLIC PANEL
Albumin: 4.2 g/dL (ref 3.5–5.2)
CO2: 22 mEq/L (ref 19–32)
Glucose, Bld: 134 mg/dL — ABNORMAL HIGH (ref 70–99)
Potassium: 4.4 mEq/L (ref 3.5–5.3)
Sodium: 142 mEq/L (ref 135–145)
Total Protein: 6 g/dL (ref 6.0–8.3)

## 2010-03-19 ENCOUNTER — Ambulatory Visit: Payer: Self-pay | Admitting: Oncology

## 2010-03-23 LAB — CBC WITH DIFFERENTIAL/PLATELET
BASO%: 0 % (ref 0.0–2.0)
Eosinophils Absolute: 0 10*3/uL (ref 0.0–0.5)
MCV: 95.4 fL (ref 79.3–98.0)
MONO#: 0.1 10*3/uL (ref 0.1–0.9)
MONO%: 0.6 % (ref 0.0–14.0)
NEUT#: 20.4 10*3/uL — ABNORMAL HIGH (ref 1.5–6.5)
RBC: 4.81 10*6/uL (ref 4.20–5.82)
RDW: 14.5 % (ref 11.0–14.6)
WBC: 21.2 10*3/uL — ABNORMAL HIGH (ref 4.0–10.3)

## 2010-03-23 LAB — COMPREHENSIVE METABOLIC PANEL
ALT: 27 U/L (ref 0–53)
BUN: 28 mg/dL — ABNORMAL HIGH (ref 6–23)
CO2: 21 mEq/L (ref 19–32)
Calcium: 9.3 mg/dL (ref 8.4–10.5)
Chloride: 104 mEq/L (ref 96–112)
Creatinine, Ser: 0.99 mg/dL (ref 0.40–1.50)
Glucose, Bld: 184 mg/dL — ABNORMAL HIGH (ref 70–99)

## 2010-03-30 ENCOUNTER — Encounter: Payer: Self-pay | Admitting: Pulmonary Disease

## 2010-03-30 LAB — CBC WITH DIFFERENTIAL/PLATELET
Basophils Absolute: 0 10*3/uL (ref 0.0–0.1)
EOS%: 0 % (ref 0.0–7.0)
HCT: 41.2 % (ref 38.4–49.9)
HGB: 13.8 g/dL (ref 13.0–17.1)
LYMPH%: 2.9 % — ABNORMAL LOW (ref 14.0–49.0)
MCH: 32.2 pg (ref 27.2–33.4)
MCV: 96.3 fL (ref 79.3–98.0)
MONO%: 0.3 % (ref 0.0–14.0)
NEUT%: 96.8 % — ABNORMAL HIGH (ref 39.0–75.0)
Platelets: 168 10*3/uL (ref 140–400)

## 2010-03-31 ENCOUNTER — Other Ambulatory Visit: Admission: RE | Admit: 2010-03-31 | Discharge: 2010-03-31 | Payer: Self-pay | Admitting: Oncology

## 2010-04-01 LAB — KAPPA/LAMBDA LIGHT CHAINS: Lambda Free Lght Chn: 0.58 mg/dL (ref 0.57–2.63)

## 2010-04-01 LAB — COMPREHENSIVE METABOLIC PANEL
ALT: 16 U/L (ref 0–53)
CO2: 23 mEq/L (ref 19–32)
Sodium: 141 mEq/L (ref 135–145)
Total Bilirubin: 0.8 mg/dL (ref 0.3–1.2)
Total Protein: 5.9 g/dL — ABNORMAL LOW (ref 6.0–8.3)

## 2010-04-01 LAB — PROTEIN ELECTROPHORESIS, SERUM
Beta 2: 4.7 % (ref 3.2–6.5)
Beta Globulin: 6.3 % (ref 4.7–7.2)
Total Protein, Serum Electrophoresis: 5.9 g/dL — ABNORMAL LOW (ref 6.0–8.3)

## 2010-04-02 ENCOUNTER — Telehealth: Payer: Self-pay | Admitting: Pulmonary Disease

## 2010-04-02 LAB — UIFE/LIGHT CHAINS/TP QN, 24-HR UR
Alpha 1, Urine: DETECTED — AB
Alpha 2, Urine: DETECTED — AB
Free Kappa Lt Chains,Ur: 5.17 mg/dL — ABNORMAL HIGH (ref 0.04–1.51)
Free Kappa/Lambda Ratio: 86.17 ratio — ABNORMAL HIGH (ref 0.46–4.00)
Gamma Globulin, Urine: DETECTED — AB

## 2010-04-08 ENCOUNTER — Ambulatory Visit: Admission: RE | Admit: 2010-04-08 | Discharge: 2010-04-08 | Payer: Self-pay | Admitting: Oncology

## 2010-04-23 ENCOUNTER — Ambulatory Visit: Payer: Self-pay | Admitting: Oncology

## 2010-05-13 HISTORY — PX: BONE MARROW TRANSPLANT: SHX200

## 2010-06-12 ENCOUNTER — Ambulatory Visit: Payer: Self-pay | Admitting: Oncology

## 2010-06-24 ENCOUNTER — Encounter: Payer: Self-pay | Admitting: Cardiology

## 2010-06-24 LAB — COMPREHENSIVE METABOLIC PANEL
ALT: 13 U/L (ref 0–53)
Albumin: 3.2 g/dL — ABNORMAL LOW (ref 3.5–5.2)
Alkaline Phosphatase: 78 U/L (ref 39–117)
CO2: 29 mEq/L (ref 19–32)
Glucose, Bld: 93 mg/dL (ref 70–99)
Potassium: 4.2 mEq/L (ref 3.5–5.3)
Sodium: 139 mEq/L (ref 135–145)
Total Bilirubin: 0.9 mg/dL (ref 0.3–1.2)
Total Protein: 6.1 g/dL (ref 6.0–8.3)

## 2010-06-24 LAB — CBC WITH DIFFERENTIAL/PLATELET
BASO%: 0.2 % (ref 0.0–2.0)
Eosinophils Absolute: 0 10*3/uL (ref 0.0–0.5)
LYMPH%: 25.7 % (ref 14.0–49.0)
MCHC: 33.6 g/dL (ref 32.0–36.0)
MONO#: 0.7 10*3/uL (ref 0.1–0.9)
MONO%: 16.4 % — ABNORMAL HIGH (ref 0.0–14.0)
NEUT#: 2.4 10*3/uL (ref 1.5–6.5)
Platelets: 329 10*3/uL (ref 140–400)
RBC: 3.19 10*6/uL — ABNORMAL LOW (ref 4.20–5.82)
RDW: 13.7 % (ref 11.0–14.6)
WBC: 4.1 10*3/uL (ref 4.0–10.3)

## 2010-07-01 LAB — CBC WITH DIFFERENTIAL/PLATELET
BASO%: 0.5 % (ref 0.0–2.0)
EOS%: 0 % (ref 0.0–7.0)
HCT: 34.4 % — ABNORMAL LOW (ref 38.4–49.9)
LYMPH%: 8.5 % — ABNORMAL LOW (ref 14.0–49.0)
MCH: 32.9 pg (ref 27.2–33.4)
MCHC: 34.1 g/dL (ref 32.0–36.0)
MONO%: 18.3 % — ABNORMAL HIGH (ref 0.0–14.0)
NEUT%: 72.7 % (ref 39.0–75.0)
Platelets: 420 10*3/uL — ABNORMAL HIGH (ref 140–400)
RBC: 3.56 10*6/uL — ABNORMAL LOW (ref 4.20–5.82)

## 2010-07-01 LAB — COMPREHENSIVE METABOLIC PANEL
ALT: <8 U/L (ref 0–53)
AST: 11 U/L (ref 0–37)
Alkaline Phosphatase: 74 U/L (ref 39–117)
Creatinine, Ser: 0.79 mg/dL (ref 0.40–1.50)
Sodium: 144 mEq/L (ref 135–145)
Total Bilirubin: 0.4 mg/dL (ref 0.3–1.2)

## 2010-07-01 LAB — MAGNESIUM: Magnesium: 1.8 mg/dL (ref 1.5–2.5)

## 2010-07-08 LAB — COMPREHENSIVE METABOLIC PANEL
AST: 10 U/L (ref 0–37)
BUN: 21 mg/dL (ref 6–23)
Calcium: 9.3 mg/dL (ref 8.4–10.5)
Chloride: 106 mEq/L (ref 96–112)
Creatinine, Ser: 0.89 mg/dL (ref 0.40–1.50)

## 2010-07-08 LAB — CBC WITH DIFFERENTIAL/PLATELET
Basophils Absolute: 0 10*3/uL (ref 0.0–0.1)
EOS%: 0.1 % (ref 0.0–7.0)
HCT: 37.1 % — ABNORMAL LOW (ref 38.4–49.9)
HGB: 12.4 g/dL — ABNORMAL LOW (ref 13.0–17.1)
LYMPH%: 8.7 % — ABNORMAL LOW (ref 14.0–49.0)
MCH: 32.3 pg (ref 27.2–33.4)
MCV: 97 fL (ref 79.3–98.0)
NEUT%: 72.4 % (ref 39.0–75.0)
Platelets: 243 10*3/uL (ref 140–400)
lymph#: 0.5 10*3/uL — ABNORMAL LOW (ref 0.9–3.3)

## 2010-07-08 LAB — MAGNESIUM: Magnesium: 1.9 mg/dL (ref 1.5–2.5)

## 2010-07-13 ENCOUNTER — Ambulatory Visit: Payer: Self-pay | Admitting: Oncology

## 2010-07-15 LAB — COMPREHENSIVE METABOLIC PANEL
AST: 10 U/L (ref 0–37)
Albumin: 3.6 g/dL (ref 3.5–5.2)
BUN: 15 mg/dL (ref 6–23)
Calcium: 9.1 mg/dL (ref 8.4–10.5)
Chloride: 107 mEq/L (ref 96–112)
Creatinine, Ser: 0.99 mg/dL (ref 0.40–1.50)
Glucose, Bld: 94 mg/dL (ref 70–99)

## 2010-07-15 LAB — CBC WITH DIFFERENTIAL/PLATELET
Basophils Absolute: 0 10*3/uL (ref 0.0–0.1)
EOS%: 0.6 % (ref 0.0–7.0)
Eosinophils Absolute: 0.1 10*3/uL (ref 0.0–0.5)
MCHC: 34.1 g/dL (ref 32.0–36.0)
MONO%: 10.3 % (ref 0.0–14.0)
NEUT%: 83.3 % — ABNORMAL HIGH (ref 39.0–75.0)
Platelets: 175 10*3/uL (ref 140–400)
WBC: 9.4 10*3/uL (ref 4.0–10.3)
lymph#: 0.5 10*3/uL — ABNORMAL LOW (ref 0.9–3.3)

## 2010-07-22 LAB — COMPREHENSIVE METABOLIC PANEL
Albumin: 3.5 g/dL (ref 3.5–5.2)
Alkaline Phosphatase: 89 U/L (ref 39–117)
BUN: 16 mg/dL (ref 6–23)
CO2: 25 mEq/L (ref 19–32)
Calcium: 9.1 mg/dL (ref 8.4–10.5)
Chloride: 111 mEq/L (ref 96–112)
Glucose, Bld: 105 mg/dL — ABNORMAL HIGH (ref 70–99)
Potassium: 4.3 mEq/L (ref 3.5–5.3)
Sodium: 145 mEq/L (ref 135–145)
Total Protein: 5.9 g/dL — ABNORMAL LOW (ref 6.0–8.3)

## 2010-07-22 LAB — CBC WITH DIFFERENTIAL/PLATELET
Basophils Absolute: 0 10*3/uL (ref 0.0–0.1)
Eosinophils Absolute: 0.1 10*3/uL (ref 0.0–0.5)
HGB: 12.8 g/dL — ABNORMAL LOW (ref 13.0–17.1)
MCV: 96.2 fL (ref 79.3–98.0)
MONO#: 1 10*3/uL — ABNORMAL HIGH (ref 0.1–0.9)
MONO%: 10.9 % (ref 0.0–14.0)
NEUT#: 7.8 10*3/uL — ABNORMAL HIGH (ref 1.5–6.5)
RBC: 3.95 10*6/uL — ABNORMAL LOW (ref 4.20–5.82)
RDW: 16.4 % — ABNORMAL HIGH (ref 11.0–14.6)
WBC: 9.6 10*3/uL (ref 4.0–10.3)
lymph#: 0.6 10*3/uL — ABNORMAL LOW (ref 0.9–3.3)

## 2010-07-22 LAB — MAGNESIUM: Magnesium: 1.8 mg/dL (ref 1.5–2.5)

## 2010-08-13 ENCOUNTER — Ambulatory Visit: Payer: Self-pay | Admitting: Oncology

## 2010-08-18 LAB — UIFE/LIGHT CHAINS/TP QN, 24-HR UR
Albumin, U: DETECTED
Alpha 2, Urine: DETECTED — AB
Free Kappa Lt Chains,Ur: 10.2 mg/dL — ABNORMAL HIGH (ref 0.04–1.51)
Free Lambda Excretion/Day: 1.56 mg/d
Free Lt Chn Excr Rate: 198.9 mg/d
Total Protein, Urine-Ur/day: 211 mg/d — ABNORMAL HIGH (ref 10–140)
Total Protein, Urine: 10.8 mg/dL

## 2010-09-14 ENCOUNTER — Ambulatory Visit: Payer: Self-pay | Admitting: Oncology

## 2010-09-17 ENCOUNTER — Encounter: Payer: Self-pay | Admitting: Pulmonary Disease

## 2010-09-17 LAB — CBC WITH DIFFERENTIAL/PLATELET
BASO%: 0.5 % (ref 0.0–2.0)
Basophils Absolute: 0 10*3/uL (ref 0.0–0.1)
Eosinophils Absolute: 0.1 10*3/uL (ref 0.0–0.5)
HGB: 14.2 g/dL (ref 13.0–17.1)
MONO#: 1 10*3/uL — ABNORMAL HIGH (ref 0.1–0.9)
RDW: 13.4 % (ref 11.0–14.6)
lymph#: 0.6 10*3/uL — ABNORMAL LOW (ref 0.9–3.3)
nRBC: 0 % (ref 0–0)

## 2010-09-24 LAB — COMPREHENSIVE METABOLIC PANEL
CO2: 23 mEq/L (ref 19–32)
Calcium: 9.1 mg/dL (ref 8.4–10.5)
Chloride: 107 mEq/L (ref 96–112)
Glucose, Bld: 82 mg/dL (ref 70–99)
Sodium: 140 mEq/L (ref 135–145)
Total Bilirubin: 0.4 mg/dL (ref 0.3–1.2)

## 2010-09-24 LAB — PROTEIN ELECTROPHORESIS, SERUM
Alpha-2-Globulin: 12.8 % — ABNORMAL HIGH (ref 7.1–11.8)
Beta 2: 3.4 % (ref 3.2–6.5)
Beta Globulin: 5.8 % (ref 4.7–7.2)

## 2010-09-24 LAB — KAPPA/LAMBDA LIGHT CHAINS
Kappa free light chain: 2.25 mg/dL — ABNORMAL HIGH (ref 0.33–1.94)
Kappa:Lambda Ratio: 2.92 — ABNORMAL HIGH (ref 0.26–1.65)

## 2010-10-01 ENCOUNTER — Encounter: Payer: Self-pay | Admitting: Pulmonary Disease

## 2010-10-01 LAB — MAGNESIUM: Magnesium: 1.8 mg/dL (ref 1.5–2.5)

## 2010-10-01 LAB — CBC WITH DIFFERENTIAL/PLATELET
HGB: 14.8 g/dL (ref 13.0–17.1)
LYMPH%: 13.4 % — ABNORMAL LOW (ref 14.0–49.0)
MCHC: 33.4 g/dL (ref 32.0–36.0)
MONO#: 0.8 10*3/uL (ref 0.1–0.9)
MONO%: 9.2 % (ref 0.0–14.0)
NEUT#: 6.7 10*3/uL — ABNORMAL HIGH (ref 1.5–6.5)
NEUT%: 76.4 % — ABNORMAL HIGH (ref 39.0–75.0)
WBC: 8.8 10*3/uL (ref 4.0–10.3)

## 2010-10-01 LAB — COMPREHENSIVE METABOLIC PANEL
AST: 13 U/L (ref 0–37)
Albumin: 3.7 g/dL (ref 3.5–5.2)
Alkaline Phosphatase: 77 U/L (ref 39–117)
BUN: 21 mg/dL (ref 6–23)
Chloride: 106 mEq/L (ref 96–112)
Creatinine, Ser: 1.04 mg/dL (ref 0.40–1.50)
Sodium: 140 mEq/L (ref 135–145)
Total Bilirubin: 0.5 mg/dL (ref 0.3–1.2)

## 2010-10-15 ENCOUNTER — Encounter: Payer: Self-pay | Admitting: Pulmonary Disease

## 2010-10-15 ENCOUNTER — Ambulatory Visit: Payer: Self-pay | Admitting: Oncology

## 2010-10-15 LAB — CBC WITH DIFFERENTIAL/PLATELET
Eosinophils Absolute: 0 10*3/uL (ref 0.0–0.5)
LYMPH%: 9.6 % — ABNORMAL LOW (ref 14.0–49.0)
MONO#: 0.7 10*3/uL (ref 0.1–0.9)
NEUT#: 6.5 10*3/uL (ref 1.5–6.5)
Platelets: 179 10*3/uL (ref 140–400)
RBC: 4.65 10*6/uL (ref 4.20–5.82)
RDW: 13.2 % (ref 11.0–14.6)
WBC: 8.1 10*3/uL (ref 4.0–10.3)
lymph#: 0.8 10*3/uL — ABNORMAL LOW (ref 0.9–3.3)

## 2010-10-16 LAB — COMPREHENSIVE METABOLIC PANEL
Albumin: 4 g/dL (ref 3.5–5.2)
CO2: 24 mEq/L (ref 19–32)
Chloride: 105 mEq/L (ref 96–112)
Glucose, Bld: 90 mg/dL (ref 70–99)
Potassium: 5 mEq/L (ref 3.5–5.3)
Sodium: 141 mEq/L (ref 135–145)
Total Protein: 6.3 g/dL (ref 6.0–8.3)

## 2010-10-16 LAB — KAPPA/LAMBDA LIGHT CHAINS
Kappa free light chain: 1.65 mg/dL (ref 0.33–1.94)
Lambda Free Lght Chn: <0.47 mg/dL — ABNORMAL LOW (ref 0.57–2.63)

## 2010-10-29 ENCOUNTER — Encounter: Payer: Self-pay | Admitting: Cardiology

## 2010-10-29 LAB — CBC WITH DIFFERENTIAL/PLATELET
Basophils Absolute: 0 10*3/uL (ref 0.0–0.1)
EOS%: 0.5 % (ref 0.0–7.0)
LYMPH%: 10.3 % — ABNORMAL LOW (ref 14.0–49.0)
MCH: 31.4 pg (ref 27.2–33.4)
MCV: 95 fL (ref 79.3–98.0)
MONO%: 8.3 % (ref 0.0–14.0)
Platelets: 155 10*3/uL (ref 140–400)
RBC: 4.42 10*6/uL (ref 4.20–5.82)
RDW: 13.5 % (ref 11.0–14.6)
nRBC: 0 % (ref 0–0)

## 2010-10-29 LAB — COMPREHENSIVE METABOLIC PANEL
Alkaline Phosphatase: 73 U/L (ref 39–117)
Creatinine, Ser: 0.93 mg/dL (ref 0.40–1.50)
Glucose, Bld: 94 mg/dL (ref 70–99)
Sodium: 142 mEq/L (ref 135–145)
Total Bilirubin: 0.5 mg/dL (ref 0.3–1.2)
Total Protein: 5.6 g/dL — ABNORMAL LOW (ref 6.0–8.3)

## 2010-11-13 ENCOUNTER — Telehealth (INDEPENDENT_AMBULATORY_CARE_PROVIDER_SITE_OTHER): Payer: Self-pay | Admitting: *Deleted

## 2010-11-24 ENCOUNTER — Ambulatory Visit (HOSPITAL_BASED_OUTPATIENT_CLINIC_OR_DEPARTMENT_OTHER): Payer: Medicare Other | Admitting: Oncology

## 2010-12-13 HISTORY — PX: BACK SURGERY: SHX140

## 2011-01-02 ENCOUNTER — Encounter: Payer: Self-pay | Admitting: Internal Medicine

## 2011-01-03 ENCOUNTER — Encounter: Payer: Self-pay | Admitting: Oncology

## 2011-01-12 NOTE — Letter (Signed)
Summary: [facility]  [facility]   Imported By: Lennie Odor 10/23/2010 13:55:17  _____________________________________________________________________  External Attachment:    Type:   Image     Comment:   External Document

## 2011-01-12 NOTE — Letter (Signed)
[facility]   Imported By: Sherian Rein 04/17/2010 08:32:43  _____________________________________________________________________  External Attachment:    Type:   Image     Comment:   External Document

## 2011-01-12 NOTE — Letter (Signed)
[facility]   Imported By: Roderic Ovens 07/13/2010 14:37:38  _____________________________________________________________________  External Attachment:    Type:   Image     Comment:   External Document

## 2011-01-12 NOTE — Letter (Signed)
Summary: [facility]  [facility]   Imported By: Sherian Rein 01/01/2010 15:05:07  _____________________________________________________________________  External Attachment:    Type:   Image     Comment:   External Document

## 2011-01-12 NOTE — Letter (Signed)
[facility]   Imported By: Roderic Ovens 02/06/2010 11:14:59  _____________________________________________________________________  External Attachment:    Type:   Image     Comment:   External Document

## 2011-01-12 NOTE — Letter (Signed)
[facility]   Imported By: Roderic Ovens 02/06/2010 11:14:37  _____________________________________________________________________  External Attachment:    Type:   Image     Comment:   External Document

## 2011-01-12 NOTE — Letter (Signed)
Summary: [facility]  [facility]   Imported By: Sherian Rein 10/23/2010 09:45:58  _____________________________________________________________________  External Attachment:    Type:   Image     Comment:   External Document

## 2011-01-12 NOTE — Miscellaneous (Signed)
Summary: Orders Update  Clinical Lists Changes DR CRENSHAW SPOKE WITH DR HA, PT TO HAVE CARDIAC MRI FOR AMYLOID Deliah Goody, RN  January 05, 2010 2:09 PM  Orders: Added new Referral order of Cardiac MRI (Cardiac MRI) - Signed

## 2011-01-12 NOTE — Letter (Signed)
Summary: Appointment - Cardiac MRI  [facility]     [place], [place]    Phone:   Fax:       January 12, 2010 MRN: [id number]   Johnny Mitchell [address] [place], [place]  [address]   Dear Mr. Lord,   We have scheduled the above patient for an appointment for a Cardiac MRI on 01-16-2010  at 2:00 p.m  .  Please refer to the below information for the location and instructions for this test:  Location:     [facility]       [address]       [place], [place]  [address] Instructions:    Wilmon Arms at [facility] Outpatient Registration 45 minutes prior to your appointment time.  This will ensure you are in the Radiology Department 30 minutes prior to your appointment.    There are no restrictions for this test you may eat and take medications as usual.  If you need to reschedule this appointment please call at the number listed above.     Sincerely,      Lorne Skeens  [facility] Scheduling Team

## 2011-01-12 NOTE — Assessment & Plan Note (Signed)
Summary: ROV/DM   Referring Provider:  Alroy Dust, MD Primary Provider:  Alroy Dust, MD   History of Present Illness: Pleasant gentleman I recently saw for exertional dyspnea.  A Myoview showed poor exercise tolerance and he exercised only for 3 minutes. The perfusion was normal. Ejection fraction was 56%. Echocardiogram performed on October 28 revealed normal LV function, pseudo-normal filling pattern, and biatrial enlargement. A BNP was mildly elevated at 120. I saw the patient back in early November and his dyspnea continued. We added a diuretic as there was edema. We scheduled a cardiac catheterization which revealed normal filling pressures and a 70-80 % OM which is unchanged compared to previous. A CBC showed a mildly elevated white blood cell count. A CT scan revealed small pleural effusions and chest wall edema. I last saw him on November 17. At that time we ordered more tests. 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 A bone marrow biopsy apparently showed amyloid. Since I last saw him there is mild dyspnea on exertion relieved with rest. It is not associated with chest pain. There is no orthopnea or PND. His pedal edema has mildly improved. He has not had syncope.  Current Medications (verified): 1)  Adult Aspirin Ec Low Strength 81 Mg  Tbec (Aspirin) .... Take 1 Tablet By Mouth Once A Day 2)  Atenolol 100 Mg  Tabs (Atenolol) .... Take 1 Tablet By Mouth Once A Day 3)  Amlodipine Besylate 5 Mg  Tabs (Amlodipine Besylate) .... Take 1 Tablet By Mouth Once A Day 4)  Diovan 160 Mg  Tabs (Valsartan) .... Take 1 Tablet By Mouth Once A Day 5)  Furosemide 20 Mg Tabs (Furosemide) .... Take One Tablet By Mouth Daily. 6)  Nitrostat 0.4 Mg Subl (Nitroglycerin) .... Place 1 Tab Under Tongue As Needed For Chest Pain...  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 S/P Knee Arthroscopy S/P Carpal Tunnel Release 9/07 by DrReynolds at [facility]--Bilateral  Social History: Reviewed history from 09/24/2009 and no changes required. Married 2 childern Patient has never smoked.  Alcohol Use - yes Patient does not get regular exercise.  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 Remaining systems are negative.   Vital Signs:  Patient profile:   70 year old male Weight:      258 pounds Pulse rate:   72 / minute Pulse rhythm:   regular BP sitting:   130 / 76  (left arm) Cuff size:   regular  Vitals Entered By: Kem Parkinson (December 19, 2009 4:31 PM)  Physical Exam  General:  Well-developed well-nourished in no acute distress.  Skin is warm and dry.  HEENT is normal.  Neck is supple. No thyromegaly.  Chest is clear to auscultation with normal expansion.  Cardiovascular exam is regular rate and rhythm.  Abdominal exam nontender or distended. No  masses palpated. Extremities show trace edema. 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 He has been seen by oncology. He has been  referred to [facility]. He will undergo chemotherapy and then consideration of bone marrow transplant. We discussed this at length today.  Problem # 2:  HYPERTENSION (ICD-401.9) Blood pressure controlled on present medications. Will continue. His updated medication list for this problem includes:    Adult Aspirin Ec Low Strength 81 Mg Tbec (Aspirin) .Marland Kitchen... Take 1 tablet by mouth once a day    Atenolol 100 Mg Tabs (Atenolol) .Marland Kitchen... Take 1 tablet by mouth once a day    Amlodipine Besylate 5 Mg Tabs (Amlodipine besylate) .Marland Kitchen... Take 1 tablet by mouth once a day    Diovan 160 Mg Tabs (Valsartan) .Marland Kitchen... Take 1 tablet by mouth once a day    Furosemide 20 Mg Tabs (Furosemide) .Marland Kitchen... Take one tablet by mouth daily.  Problem # 3:  CAD (ICD-414.00) Continue aspirin, beta blocker and statin. His updated medication list for this problem includes:    Adult Aspirin Ec Low Strength 81 Mg Tbec (Aspirin) .Marland Kitchen... Take 1 tablet by mouth once a day    Atenolol 100 Mg Tabs (Atenolol) .Marland Kitchen... Take 1 tablet by mouth once a day    Amlodipine Besylate 5 Mg Tabs (Amlodipine besylate) .Marland Kitchen... Take 1 tablet by mouth once a day    Nitrostat 0.4 Mg Subl (Nitroglycerin) .Marland Kitchen... Place 1 tab under tongue as needed for chest pain...  Problem # 4:  HYPERCHOLESTEROLEMIA (ICD-272.0) Continue statin. Followup lipids and liver with his primary care. The following medications were removed from the medication list:    Pravastatin Sodium 40 Mg Tabs (Pravastatin sodium) .Marland Kitchen... Take one tablet by mouth daily at bedtime

## 2011-01-12 NOTE — Progress Notes (Signed)
Summary: pt wants to be seen and wants refills  Phone Note Call from Patient Call back at Home Phone [phone]   Caller: Patient Call For: Leanora Murin Reason for Call: Refill Medication, Talk to Nurse Summary of Call: patient wants to see tammy today to get hayfever shot. patient demanding to be seen today, i informed the patient we have nothing avaliable. i offered him an apt for next week, and he did not want to be seen next week. patient came in but has left wainting on nurses call.  also needs atenolol, diovan amd amlodipine refilled. wanted to talk to nurse about these first.  prescription solutions Initial call taken by: Valinda Hoar,  April 02, 2010 12:57 PM  Follow-up for Phone Call        Spoke with pt.  He c/o watery eyes and runny nose and is requesting "allergy shot"- states that he gets this once a year for his allergy symptoms.  He states that he has not tried anything OTC for these symptoms.  Would like ov today but I again advised him there are no openings today.  Offered ov with TP for next wk but he refused, stating that he would be out of town all wk.  Please advise thanks NKDA Follow-up by: Vernie Murders,  April 02, 2010 1:31 PM  Additional Follow-up for Phone Call Additional follow up Details #1::        ok for pt to have depo---just have pt come up to the office please.  thanks Randell Loop CMA  April 02, 2010 2:22 PM   pt advised. Carron Curie CMA  April 02, 2010 2:35 PM

## 2011-01-12 NOTE — Progress Notes (Signed)
Summary: req to be seen asap today-sn or tp  Phone Note Call from Patient Call back at [facility] Phone [phone]   Caller: Patient Call For: nadel Summary of Call: pt wants to be seen asap today w/ either sn or tp for chest congestion/ cough/ sore throat x 2 days. pt has had a bone marrow transplant and wants to be seen. [facility] Initial call taken by: Tivis Ringer, CNA,  November 13, 2010 11:42 AM  Follow-up for Phone Call        spoke with pt and he c/o productive cough with brown phlegm, sore throat, chest congestion, runny nose, eyes watery x 2 days.  Please advise.Carron Curie CMA  November 13, 2010 12:38 PM allergies: NKDA  Additional Follow-up for Phone Call Additional follow up Details #1::        SN off this afternoon and TP is booked; per SN okay to give Augmentin 875 #20 take 1 by mouth two times a day no refills, Mucinex OTC 2 by mouth two times a day with plenty of fluids, tussionex #4oz. 1 teaspoon every 12 hours as needed cough no refills. Spoke with [facility] verbal and then they called back-Medicare will not cover cough syrups-have pt to get OTC cough syrup instead.Reynaldo Minium CMA  November 13, 2010 2:52 PM

## 2011-01-12 NOTE — Letter (Signed)
[facility]   Imported By: Roderic Ovens 02/06/2010 11:15:14  _____________________________________________________________________  External Attachment:    Type:   Image     Comment:   External Document

## 2011-01-12 NOTE — Letter (Signed)
[facility]   Imported By: Roderic Ovens 01/05/2010 10:14:53  _____________________________________________________________________  External Attachment:    Type:   Image     Comment:   External Document

## 2011-01-12 NOTE — Letter (Signed)
Summary: [facility]  [facility]   Imported By: Sherian Rein 10/05/2010 14:16:35  _____________________________________________________________________  External Attachment:    Type:   Image     Comment:   External Document

## 2011-01-12 NOTE — Letter (Signed)
Summary: [facility]  [facility]   Imported By: Lennie Odor 11/06/2010 11:16:41  _____________________________________________________________________  External Attachment:    Type:   Image     Comment:   External Document

## 2011-01-13 DIAGNOSIS — R000 Tachycardia, unspecified: Secondary | ICD-10-CM

## 2011-01-13 DIAGNOSIS — I472 Ventricular tachycardia: Secondary | ICD-10-CM

## 2011-01-13 HISTORY — DX: Tachycardia, unspecified: R00.0

## 2011-01-13 HISTORY — DX: Ventricular tachycardia: I47.2

## 2011-01-17 ENCOUNTER — Encounter (HOSPITAL_COMMUNITY): Payer: Self-pay | Admitting: Radiology

## 2011-01-17 ENCOUNTER — Emergency Department (HOSPITAL_COMMUNITY): Payer: Medicare Other

## 2011-01-17 ENCOUNTER — Inpatient Hospital Stay (HOSPITAL_COMMUNITY)
Admission: EM | Admit: 2011-01-17 | Discharge: 2011-01-18 | DRG: 281 | Disposition: A | Discharge status: Home / Self Care | Payer: Medicare Other | Attending: Cardiology | Admitting: Cardiology

## 2011-01-17 ENCOUNTER — Encounter: Payer: Self-pay | Admitting: Internal Medicine

## 2011-01-17 DIAGNOSIS — R000 Tachycardia, unspecified: Secondary | ICD-10-CM

## 2011-01-17 DIAGNOSIS — Z96659 Presence of unspecified artificial knee joint: Secondary | ICD-10-CM

## 2011-01-17 DIAGNOSIS — E859 Amyloidosis, unspecified: Secondary | ICD-10-CM | POA: Diagnosis present

## 2011-01-17 DIAGNOSIS — I2510 Atherosclerotic heart disease of native coronary artery without angina pectoris: Secondary | ICD-10-CM | POA: Diagnosis present

## 2011-01-17 DIAGNOSIS — I214 Non-ST elevation (NSTEMI) myocardial infarction: Secondary | ICD-10-CM | POA: Diagnosis present

## 2011-01-17 DIAGNOSIS — Z7982 Long term (current) use of aspirin: Secondary | ICD-10-CM

## 2011-01-17 DIAGNOSIS — I498 Other specified cardiac arrhythmias: Principal | ICD-10-CM | POA: Diagnosis present

## 2011-01-17 DIAGNOSIS — M1990 Unspecified osteoarthritis, unspecified site: Secondary | ICD-10-CM | POA: Diagnosis present

## 2011-01-17 DIAGNOSIS — E785 Hyperlipidemia, unspecified: Secondary | ICD-10-CM | POA: Diagnosis present

## 2011-01-17 DIAGNOSIS — I10 Essential (primary) hypertension: Secondary | ICD-10-CM | POA: Diagnosis present

## 2011-01-17 LAB — CK TOTAL AND CKMB (NOT AT ARMC)
Relative Index: 11.6 — ABNORMAL HIGH (ref 0.0–2.5)
Total CK: 114 U/L (ref 7–232)

## 2011-01-17 LAB — POCT CARDIAC MARKERS
CKMB, poc: 2.5 ng/mL (ref 1.0–8.0)
Troponin i, poc: <0.05 ng/mL (ref 0.00–0.09)

## 2011-01-17 LAB — CBC
HCT: 41.8 % (ref 39.0–52.0)
Hemoglobin: 13.6 g/dL (ref 13.0–17.0)
RDW: 13.5 % (ref 11.5–15.5)
WBC: 10.2 10*3/uL (ref 4.0–10.5)

## 2011-01-17 LAB — COMPREHENSIVE METABOLIC PANEL
ALT: 38 U/L (ref 0–53)
AST: 49 U/L — ABNORMAL HIGH (ref 0–37)
Alkaline Phosphatase: 78 U/L (ref 39–117)
CO2: 26 mEq/L (ref 19–32)
GFR calc Af Amer: >60 mL/min (ref >60)
Glucose, Bld: 125 mg/dL — ABNORMAL HIGH (ref 70–99)
Potassium: 4 mEq/L (ref 3.5–5.1)
Sodium: 142 mEq/L (ref 135–145)
Total Protein: 5.4 g/dL — ABNORMAL LOW (ref 6.0–8.3)

## 2011-01-17 LAB — TROPONIN I: Troponin I: 1.1 ng/mL (ref 0.00–0.06)

## 2011-01-17 LAB — CARDIAC PANEL(CRET KIN+CKTOT+MB+TROPI): Total CK: 87 U/L (ref 7–232)

## 2011-01-17 LAB — TSH: TSH: 1.167 u[IU]/mL (ref 0.350–4.500)

## 2011-01-17 LAB — MRSA PCR SCREENING: MRSA by PCR: NEGATIVE

## 2011-01-18 ENCOUNTER — Encounter: Payer: Self-pay | Admitting: Internal Medicine

## 2011-01-18 DIAGNOSIS — I471 Supraventricular tachycardia, unspecified: Secondary | ICD-10-CM

## 2011-01-18 DIAGNOSIS — I369 Nonrheumatic tricuspid valve disorder, unspecified: Secondary | ICD-10-CM

## 2011-01-18 LAB — CARDIAC PANEL(CRET KIN+CKTOT+MB+TROPI)
CK, MB: 6.4 ng/mL (ref 0.3–4.0)
CK, MB: 2.5 ng/mL (ref 0.3–4.0)
Relative Index: INVALID (ref 0.0–2.5)
Total CK: 69 U/L (ref 7–232)
Total CK: 51 U/L (ref 7–232)
Troponin I: 1.01 ng/mL (ref 0.00–0.06)
Troponin I: 0.64 ng/mL (ref 0.00–0.06)

## 2011-01-21 ENCOUNTER — Encounter: Payer: Self-pay | Admitting: Cardiology

## 2011-01-21 ENCOUNTER — Encounter (HOSPITAL_BASED_OUTPATIENT_CLINIC_OR_DEPARTMENT_OTHER): Payer: Medicare Other | Admitting: Oncology

## 2011-01-21 DIAGNOSIS — E8589 Other amyloidosis: Secondary | ICD-10-CM

## 2011-01-21 DIAGNOSIS — Z5112 Encounter for antineoplastic immunotherapy: Secondary | ICD-10-CM

## 2011-01-21 LAB — CBC WITH DIFFERENTIAL/PLATELET
Basophils Absolute: 0 10*3/uL (ref 0.0–0.1)
EOS%: 0.5 % (ref 0.0–7.0)
HCT: 43.7 % (ref 38.4–49.9)
HGB: 14.7 g/dL (ref 13.0–17.1)
MONO#: 0.8 10*3/uL (ref 0.1–0.9)
NEUT#: 6.6 10*3/uL — ABNORMAL HIGH (ref 1.5–6.5)
NEUT%: 79.5 % — ABNORMAL HIGH (ref 39.0–75.0)
RDW: 13.2 % (ref 11.0–14.6)
WBC: 8.3 10*3/uL (ref 4.0–10.3)
lymph#: 0.8 10*3/uL — ABNORMAL LOW (ref 0.9–3.3)

## 2011-01-25 LAB — COMPREHENSIVE METABOLIC PANEL
AST: 16 U/L (ref 0–37)
Albumin: 3.9 g/dL (ref 3.5–5.2)
Alkaline Phosphatase: 90 U/L (ref 39–117)
BUN: 20 mg/dL (ref 6–23)
Calcium: 9.1 mg/dL (ref 8.4–10.5)
Chloride: 105 mEq/L (ref 96–112)
Glucose, Bld: 104 mg/dL — ABNORMAL HIGH (ref 70–99)
Potassium: 4.8 mEq/L (ref 3.5–5.3)
Sodium: 141 mEq/L (ref 135–145)
Total Protein: 6.1 g/dL (ref 6.0–8.3)

## 2011-01-25 LAB — PROTEIN ELECTROPHORESIS, SERUM
Albumin ELP: 59 % (ref 55.8–66.1)
Alpha-1-Globulin: 3.8 % (ref 2.9–4.9)
Beta 2: 5 % (ref 3.2–6.5)
Beta Globulin: 5.2 % (ref 4.7–7.2)
Gamma Globulin: 13.9 % (ref 11.1–18.8)

## 2011-01-25 LAB — KAPPA/LAMBDA LIGHT CHAINS
Kappa free light chain: 2.25 mg/dL — ABNORMAL HIGH (ref 0.33–1.94)
Lambda Free Lght Chn: 0.86 mg/dL (ref 0.57–2.63)

## 2011-01-27 NOTE — Consult Note (Signed)
NAME:  Johnny Mitchell, Johnny Mitchell NO.:  [id number]  MEDICAL RECORD NO.:  [id number]           PATIENT TYPE:  I  LOCATION:  2927                         FACILITY:  [facility]  PHYSICIAN:  Hillis Range, MD       DATE OF BIRTH:  May 05, 1941  DATE OF CONSULTATION: DATE OF DISCHARGE:  01/18/2011                                CONSULTATION   REQUESTING PHYSICIAN:  Rollene Rotunda, MD, [facility]  REASON FOR CONSULTATION:  Wide complex tachycardia.  HISTORY OF PRESENT ILLNESS:  Johnny Mitchell is a pleasant 70 year old gentleman with a history of medically-managed coronary artery disease, preserved ejection fraction and left bundle-branch block with amyloidosis for which he has previously undergone autologous stem cell transplant at [facility] with subsequent recurrence who now presents with a symptomatic wide complex tachycardia.  The patient reports being in his usual health state until waking in the middle of the night early Sunday morning with symptoms of "indigestion."  He describes chest tightness and diaphoresis.  EMS was called and upon their arrival, he was found to have a wide complex tachycardia at 210 beats per minute.  He was treated with intravenous amiodarone and converted to sinus rhythm.  He was brought to [facility] for further evaluation.  He has been observed without recurrent tachycardia since that time.  Presently, he is resting comfortably.  He denies any further chest pain, shortness of breath, palpitations, presyncope, or syncope.  He is unaware of any prior episodes of arrhythmias.  He is presently returned to his usual health state and like to discharge.  PAST MEDICAL HISTORY: 1. Left bundle-branch block. 2. Coronary artery disease, medically managed.  Last cath on October 16, 2009. 3. Hypertension. 4. Hyperlipidemia. 5. Amyloidosis, status post autologous stem cell transplant at [facility]     with subsequent recurrence, now planning to start  chemotherapy.  PAST SURGICAL HISTORY: 1. New arthroscopy. 2. Carpal tunnels surgery. 3. Bone marrow transplant.  MEDICATIONS: 1. Atenolol 100 mg daily. 2. Acyclovir 800 mg b.i.d. 3. Spironolactone 25 mg daily. 4. Oxycodone p.r.n.  ALLERGIES:  None.  SOCIAL HISTORY:  The patient lives in [place] with his spouse.  He denies tobacco and rarely drinks alcohol.  FAMILY HISTORY:  Notable for no early coronary artery disease.  REVIEW OF SYSTEMS:  All systems were reviewed and negative except as outlined in the HPI above.  PHYSICAL EXAMINATION:  Telemetry reveals sinus rhythm with a left bundle- branch block with no arrhythmias. VITAL SIGNS:  Blood pressure 131/76, heart rate 59, respirations 18, sats 97% on room air, afebrile. GENERAL:  The patient is a well-appearing male in no acute distress.  He is alert and oriented x3. HEENT:  Normocephalic, atraumatic.  Sclerae clear.  Conjunctivae pink. Oropharynx clear. NECK:  Supple.  No thyromegaly, JVD or bruits. LUNGS:  Clear to auscultation bilaterally. HEART:  Regular rate and rhythm.  No murmurs, rubs or gallops. GI:  Soft, nontender, and nondistended.  Positive bowel sounds. EXTREMITIES:  No clubbing, cyanosis or edema. SKIN:  No ecchymoses or lacerations. MUSCULOSKELETAL:  No deformity or atrophy. PSYCHIATRY:  Euthymic mood.  Full affect.  EKG  today reveals sinus rhythm with a left bundle-branch block. Echocardiogram today is pending, though he did have an echocardiogram on October 09, 2009, which revealed a normal left ventricular ejection fraction with no wall motion abnormalities.  The right ventricle was also normal at that time.  LABORATORY DATA:  Reviewed, included TSH of 1.167, peak troponin 1.69, peak CK-MB 10.9, which are both now resolving.  IMPRESSION:  Johnny Mitchell is a pleasant 70 year old gentleman who was admitted with a symptomatic wide-complex tachycardia.  His QRS morphology during tachycardia was  identical to sinus rhythm.  I therefore suspect that he most likely had supraventricular tachycardia with aberrancy over a left bundle-branch block.  This differential could include atrioventricular nodal reentrant tachycardia and an atrial tachycardia.  He has had multiple premature atrial contractions subsequently which are therefore suggestive potentially of an atrial tachycardia.  He also could have bundle branch reentry though I think this is less likely.  The patient has been treated with intravenous amiodarone drip over the past 24 hours and his rhythm remains quiescent. We are presently waiting for his repeat echocardiogram.  He did have a very small elevation in his cardiac markers in the setting of supraventricular tachycardia with known coronary artery disease.  He has no symptoms of ischemia now that his arrhythmia has resolved.  PLAN:  I think that we should treat the patient conservatively at this time.  I would stop intravenous amiodarone.  We will continue oral amiodarone 200 mg twice daily for 4 weeks and then plan to discontinue amiodarone at that time if he has had no further arrhythmias.  We will also continue atenolol long-term.  If he has recurrent symptomatic arrhythmias, then I think that EP study and radiofrequency ablation would be a reasonable strategy at that time.  The patient agrees with this plan and would like to avoid EP study presently.  We will follow the patient during his hospital stay.     Hillis Range, MD     JA/MEDQ  D:  01/18/2011  T:  01/19/2011  Job:  [id number]  cc:   Madolyn Frieze. Jens Som, MD, [facility] Rollene Rotunda, MD, [facility]  Electronically Signed by Hillis Range MD on 01/27/2011 10:21:17 PM

## 2011-01-27 NOTE — Discharge Summary (Signed)
NAME:  Johnny Mitchell, Johnny Mitchell NO.:  [id number]  MEDICAL RECORD NO.:  [id number]           PATIENT TYPE:  I  LOCATION:  2927                         FACILITY:  [facility]  PHYSICIAN:  Hillis Range, MD       DATE OF BIRTH:  21-Nov-1941  DATE OF ADMISSION:  01/17/2011 DATE OF DISCHARGE:  01/18/2011                              DISCHARGE SUMMARY   PRIMARY CARDIOLOGIST:  Madolyn Frieze. Jens Som, MD, [facility]  ELECTROPHYSIOLOGIST:  Hillis Range, MD  PRIMARY CARE PHYSICIAN:  Lonzo Cloud. Kriste Basque, MD  DISCHARGE DIAGNOSES: 1. Supraventricular tachycardia with aberrancy.     a.     Wide complex tachycardia evaluated by Electrophysiology,      deemed most likely to be supraventricular tachycardia with      aberrancy and less likely to be bundle-branch block, reentrant      tachycardia.     b.     Continued beta-blockade therapy with atenolol and 4-week      antiarrhythmic therapy with amiodarone 200 mg p.o. b.i.d.     c.     Follow up with Hillis Range in 4 weeks (the patient to be      contacted). 2. Type 2 non-ST elevation myocardial infarction (secondary to     demand).     a.     Point-of-care markers with troponin of 0.36, first full set      of enzymes, troponin 1.10, third set troponin 1.69, fourth set      1.01, fourth set troponin 0.64, fifth set troponin 0.52.  First      full set of enzymes CK 114, MB 13.2, relative index 11.6, not      significant on followup cardiac enzymes.  SECONDARY DIAGNOSES: 1. Coronary artery disease noncritical, small vessel.     a.     Cardiac catheterization October 16, 2009, with only      significant stenosis in obtuse marginal 2, 70-80% stenosis, 2-mm      vessel, stable from cath in 2002. 2. Hypertension. 3. Hyperlipidemia. 4. Amyloidosis.     a.     Status post stem cell transplant. 5. Degenerative joint disease. 6. History of hemorrhoids. 7. Status post knee arthroscopy. 8. Status post carpal tunnel surgery.  ALLERGIES:   NKDA.  PROCEDURES: 1. EKG January 17, 2011:  NSR, multifocal premature atrial     contractions, LBBB, and LAD. 2. Chest x-ray January 17, 2011:  Stable to slightly improved chest.     Minimal increased prominence in the right hilum.  Cardiomegaly. 3. A 2-D echocardiogram January 18, 2011:  LV with septal dyssynergy,     technically limited study, cavity size normal, moderate LVH, EF     50%, regional wall motion abnormalities cannot be excluded.  RV     size mildly dilated, systolic function mildly reduced, RV systolic     pressure 53 mmHg.  Tricuspid valve with mild regurgitation.  PA and     peak pressure 53 mmHg. 4. EKG January 18, 2011, sinus bradycardia, 55 bpm, otherwise no     significant changes from prior tracing.  HISTORY OF PRESENT ILLNESS:  Johnny Mitchell is  a pleasant 70 year old Caucasian gentleman with the above-noted medical history who was in his usual state of health until he awoke on the morning of presentation with a sensation of epigastric discomfort and heartburn.  He felt clammy and was chilled, sweaty, did not have chest pressure, neck or arm discomfort nor did he notice palpitations, presyncope/syncope.  He was mildly short of breath.  He denies any recent PND or orthopnea.  EMS was contacted and he was noted to be in a wide complex tachycardia at a rate of approximately 220 bpm.  He was treated with IV amiodarone and converted to sinus rhythm with premature atrial contractions.  Of note, he does have a known history of left bundle-branch block.  When he was evaluated by Cardiology in the emergency department, the patient was no longer symptomatic.  HOSPITAL COURSE:  The patient was admitted and due to the question as to the etiology of his wide complex tachycardia that was symptomatic, he received an electrophysiology consult as well as a 2-D echocardiogram. He was noted in the interim to have cardiac enzymes.  They were initially mildly elevated and then  significantly so but then down trending as several sets were checked.  Electrophysiologist saw the patient and determined that if his 2-D echocardiogram was normal, he did not require any further evaluation in the hospital.  It was determination by electrophysiologist Hillis Range that his wide complex tachycardia was likely SVT with aberrancy and less likely to be bundle- branch block reentrant tachycardia.  The plan going forward will be to continue amiodarone initially given IV at 200 mg p.o. b.i.d. x4 weeks. He will then see Dr. Hillis Range in approximately 4 weeks (office closed, message left, office will contact the patient).  Should the patient have recurrent arrhythmias, may consider all caps EPS/RFA. Other than the addition of his amiodarone therapy, no changes were made to his preadmission medications except for low-dose enteric-coated aspirin.  At the time of discharge, the patient received his new medication list, prescriptions, followup instructions, and all questions and concerns were addressed prior to leaving the hospital.  DISCHARGE LABS:  WBC is 10.2, HGB 13.6, HCT 41.8, PLT count 165,000. Sodium 142, potassium 4.0, chloride 106, bicarb 26, BUN 18, creatinine 0.96, glucose 125.  Liver function tests within normal limits except for AST of 49, magnesium 1.9, albumin 3.0, calcium 8.8.  First set of point- of-care markers with troponin of 0.36, second full set CK 104, MB 13.2, relative index 11.6, troponin-I 1.10.  Second full set of enzymes CK 87, MB 10.9, troponin I 1.69.  Third full set CK 69, MB 6.4, troponin 1.01. Fourth full set CK 55, MB 3.9, troponin-I 0.64.  Final set CK 51, MB 2.5, troponin 0.52.  TSH 1.67.  FOLLOWUP PLANS AND APPOINTMENTS:  Please see hospital course.  DISCHARGE MEDICATIONS: 1. Enteric-coated aspirin 81 mg p.o. daily. 2. Amiodarone 200 mg p.o. b.i.d. 3. Acyclovir 800 mg 1 tablet p.o. b.i.d. 4. Atenolol 100 mg p.o. q.a.m. 5. Oxycodone CR 10  mg 1 tablet p.o. q.a.m. 6. Spironolactone 25 mg 1 tablet p.o. q.a.m.  DURATION OF DISCHARGE ENCOUNTER INCLUDING PHYSICIAN TIME:  35 minutes.     Jarrett Ables, PAC   ______________________________ Hillis Range, MD    MS/MEDQ  D:  01/18/2011  T:  01/19/2011  Job:  [id number]  cc:   Madolyn Frieze. Jens Som, MD, [facility] Hillis Range, MD  Electronically Signed by Jarrett Ables PAC on 01/20/2011 03:27:14 PM Electronically Signed by Hillis Range MD on  01/27/2011 10:21:10 PM

## 2011-01-29 ENCOUNTER — Other Ambulatory Visit: Payer: Self-pay | Admitting: Oncology

## 2011-01-29 ENCOUNTER — Encounter (HOSPITAL_BASED_OUTPATIENT_CLINIC_OR_DEPARTMENT_OTHER): Payer: Medicare Other | Admitting: Oncology

## 2011-01-29 DIAGNOSIS — Z5112 Encounter for antineoplastic immunotherapy: Secondary | ICD-10-CM

## 2011-01-29 DIAGNOSIS — E8589 Other amyloidosis: Secondary | ICD-10-CM

## 2011-01-29 LAB — CBC WITH DIFFERENTIAL/PLATELET
BASO%: 0.2 % (ref 0.0–2.0)
Basophils Absolute: 0 10*3/uL (ref 0.0–0.1)
EOS%: 1.5 % (ref 0.0–7.0)
HCT: 42.6 % (ref 38.4–49.9)
HGB: 14.6 g/dL (ref 13.0–17.1)
LYMPH%: 10.1 % — ABNORMAL LOW (ref 14.0–49.0)
MCH: 32.2 pg (ref 27.2–33.4)
MCHC: 34.2 g/dL (ref 32.0–36.0)
MCV: 94.1 fL (ref 79.3–98.0)
MONO%: 9.5 % (ref 0.0–14.0)
NEUT%: 78.7 % — ABNORMAL HIGH (ref 39.0–75.0)

## 2011-01-29 LAB — BASIC METABOLIC PANEL
BUN: 21 mg/dL (ref 6–23)
Calcium: 8.7 mg/dL (ref 8.4–10.5)
Chloride: 105 mEq/L (ref 96–112)
Creatinine, Ser: 0.96 mg/dL (ref 0.40–1.50)

## 2011-01-29 LAB — MAGNESIUM: Magnesium: 1.8 mg/dL (ref 1.5–2.5)

## 2011-02-03 NOTE — Consult Note (Signed)
Summary: [facility] Consultation Report  [facility] Consultation Report   Imported By: Earl Many 01/26/2011 15:00:23  _____________________________________________________________________  External Attachment:    Type:   Image     Comment:   External Document

## 2011-02-03 NOTE — Letter (Signed)
Summary: [facility] Discharge Summary  [facility] Discharge Summary   Imported By: Earl Many 01/26/2011 15:05:39  _____________________________________________________________________  External Attachment:    Type:   Image     Comment:   External Document

## 2011-02-04 ENCOUNTER — Other Ambulatory Visit: Payer: Self-pay | Admitting: Oncology

## 2011-02-04 ENCOUNTER — Encounter (HOSPITAL_BASED_OUTPATIENT_CLINIC_OR_DEPARTMENT_OTHER): Payer: Medicare Other | Admitting: Oncology

## 2011-02-04 DIAGNOSIS — E8589 Other amyloidosis: Secondary | ICD-10-CM

## 2011-02-04 DIAGNOSIS — Z5112 Encounter for antineoplastic immunotherapy: Secondary | ICD-10-CM

## 2011-02-04 LAB — CBC WITH DIFFERENTIAL/PLATELET
Basophils Absolute: 0 10*3/uL (ref 0.0–0.1)
Eosinophils Absolute: 0.1 10*3/uL (ref 0.0–0.5)
HCT: 41.7 % (ref 38.4–49.9)
LYMPH%: 13.5 % — ABNORMAL LOW (ref 14.0–49.0)
MCV: 93.5 fL (ref 79.3–98.0)
MONO#: 0.9 10*3/uL (ref 0.1–0.9)
MONO%: 9.5 % (ref 0.0–14.0)
NEUT#: 7.3 10*3/uL — ABNORMAL HIGH (ref 1.5–6.5)
NEUT%: 76.2 % — ABNORMAL HIGH (ref 39.0–75.0)
Platelets: 196 10*3/uL (ref 140–400)
RBC: 4.46 10*6/uL (ref 4.20–5.82)
WBC: 9.6 10*3/uL (ref 4.0–10.3)
nRBC: 0 % (ref 0–0)

## 2011-02-04 LAB — MAGNESIUM: Magnesium: 2 mg/dL (ref 1.5–2.5)

## 2011-02-04 LAB — COMPREHENSIVE METABOLIC PANEL
ALT: 23 U/L (ref 0–53)
Albumin: 3.8 g/dL (ref 3.5–5.2)
CO2: 24 mEq/L (ref 19–32)
Chloride: 107 mEq/L (ref 96–112)
Potassium: 4.9 mEq/L (ref 3.5–5.3)
Sodium: 139 mEq/L (ref 135–145)
Total Bilirubin: 0.4 mg/dL (ref 0.3–1.2)
Total Protein: 6.3 g/dL (ref 6.0–8.3)

## 2011-02-11 ENCOUNTER — Encounter: Payer: Self-pay | Admitting: Internal Medicine

## 2011-02-11 ENCOUNTER — Encounter (INDEPENDENT_AMBULATORY_CARE_PROVIDER_SITE_OTHER): Payer: Medicare Other | Admitting: Internal Medicine

## 2011-02-11 DIAGNOSIS — I471 Supraventricular tachycardia: Secondary | ICD-10-CM

## 2011-02-11 DIAGNOSIS — I447 Left bundle-branch block, unspecified: Secondary | ICD-10-CM | POA: Insufficient documentation

## 2011-02-11 DIAGNOSIS — I10 Essential (primary) hypertension: Secondary | ICD-10-CM

## 2011-02-11 DIAGNOSIS — I5033 Acute on chronic diastolic (congestive) heart failure: Secondary | ICD-10-CM

## 2011-02-17 ENCOUNTER — Other Ambulatory Visit: Payer: Self-pay | Admitting: Oncology

## 2011-02-17 ENCOUNTER — Telehealth: Payer: Self-pay | Admitting: Cardiology

## 2011-02-17 ENCOUNTER — Encounter (HOSPITAL_BASED_OUTPATIENT_CLINIC_OR_DEPARTMENT_OTHER): Payer: Medicare Other | Admitting: Oncology

## 2011-02-17 ENCOUNTER — Telehealth (INDEPENDENT_AMBULATORY_CARE_PROVIDER_SITE_OTHER): Payer: Self-pay | Admitting: *Deleted

## 2011-02-17 DIAGNOSIS — E8589 Other amyloidosis: Secondary | ICD-10-CM

## 2011-02-17 DIAGNOSIS — G4733 Obstructive sleep apnea (adult) (pediatric): Secondary | ICD-10-CM | POA: Insufficient documentation

## 2011-02-17 DIAGNOSIS — Z5112 Encounter for antineoplastic immunotherapy: Secondary | ICD-10-CM

## 2011-02-17 LAB — CBC WITH DIFFERENTIAL/PLATELET
BASO%: 0 % (ref 0.0–2.0)
EOS%: 0.5 % (ref 0.0–7.0)
HCT: 41.2 % (ref 38.4–49.9)
LYMPH%: 11.7 % — ABNORMAL LOW (ref 14.0–49.0)
MCH: 32 pg (ref 27.2–33.4)
MCHC: 33.9 g/dL (ref 32.0–36.0)
NEUT%: 81.7 % — ABNORMAL HIGH (ref 39.0–75.0)
RBC: 4.36 10*6/uL (ref 4.20–5.82)
lymph#: 1 10*3/uL (ref 0.9–3.3)

## 2011-02-17 LAB — COMPREHENSIVE METABOLIC PANEL
ALT: 26 U/L (ref 0–53)
AST: 25 U/L (ref 0–37)
Chloride: 106 mEq/L (ref 96–112)
Creatinine, Ser: 1.03 mg/dL (ref 0.40–1.50)
Sodium: 140 mEq/L (ref 135–145)
Total Bilirubin: 0.6 mg/dL (ref 0.3–1.2)

## 2011-02-18 NOTE — Assessment & Plan Note (Signed)
Summary: EPH   Visit Type:  Follow-up Referring Provider:  Alroy Dust, MD Primary Provider:  Alroy Dust, MD   History of Present Illness: Mr Johnny Mitchell presents today for follow-up after his recent hospitalization for wide complex tachycardia, felt to most likely be SVT.  He has done well since that time, without any further symptomatic arrhythmias.  He has tolerated amiodarone reasonably well.   He continues to have SOB as well as bilateral lower extremety edema.  He denies chest pain, palpitations, presyncope, or syncope.  Current Medications (verified): 1)  Atenolol 100 Mg  Tabs (Atenolol) .... Take 1 Tablet By Mouth Once A Day 2)  Spironolactone 25 Mg Tabs (Spironolactone) .... Once Daily 3)  Amiodarone Hcl 200 Mg Tabs (Amiodarone Hcl) .... Take One Tablet By Mouth Twice A Day 4)  Oxycodone Hcl 10 Mg Tabs (Oxycodone Hcl) .... As Needed 5)  Acyclovir 800 Mg Tabs (Acyclovir) .... 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 S/P Knee Arthroscopy S/P Carpal Tunnel Release 9/07 by DrReynolds at [facility]--Bilateral  Social History: Reviewed history from 09/24/2009 and no changes required. Married 2 childern Patient has never smoked.  Alcohol Use - yes Patient does not get regular exercise.  Occupation: Semi-Retired, pine [employer]  Review of Systems       All  systems are reviewed and negative except as listed in the HPI.   Vital Signs:  Patient profile:   70 year old male Height:      71 inches Weight:      260 pounds BMI:     36.39 Pulse rate:   51 / minute BP sitting:   130 / 60  (left arm)  Vitals Entered By: Laurance Flatten CMA (February 11, 2011 12:13 PM)  Physical Exam  General:  Well developed, well nourished, in no acute distress. Head:  normocephalic and atraumatic Eyes:  PERRLA/EOM intact; conjunctiva and lids normal. Mouth:  Teeth, gums and palate normal. Oral mucosa normal. Neck:  supple Lungs:  Clear bilaterally to auscultation and percussion. Heart:  RRR, no m/r/g Abdomen:  Bowel sounds positive; abdomen soft and non-tender without masses, organomegaly, or hernias noted. No hepatosplenomegaly. Msk:  Back normal, normal gait. Muscle strength and tone normal. Extremities:  2+ BLE edema Neurologic:  Alert and oriented x 3. Skin:  Intact without lesions or rashes. Psych:  Normal affect.   EKG  Procedure date:  02/11/2011  Findings:      sinus bradycardia 51 bpm, PR 234, LBBB, Qtc 495  Echocardiogram  Procedure date:  01/18/2011  Findings:         - Left ventricle: There is septal dyssynergy. The study is     technically limited. The cavity size was normal. Wall thickness     was increased in a pattern of moderate LVH. The estimated ejection     fraction was 50%. Regional wall motion abnormalities cannot be     excluded.   - Right ventricle: The cavity size was mildly dilated. Systolic     function was mildly reduced.  RV systolic pressure: 53mm Hg (S,     est).   - Tricuspid valve: Mild regurgitation.   - Pulmonary arteries: PA peak pressure: 53mm Hg (S).  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  He was placed on amiodarone and has had no  further arrhythmias since that time. I am concerned about the use of amoidarone longterm.  I will therefore stop amiodarone today and continue atenolol. If he has further tachycardia, then he may require EPS/RFA.  Given his 1st degree AV block and LBBB, risks for requiring a pacemaker would be increased.  We will try to avoid ablation unless his arrhythmias recur.  Problem # 2:  DYSPNEA (ICD-786.05) The patient has progressive dyspnea.  His recent echo reveals EF 50%.  I am concerned that his dypsnea may represent progression of his amyloidosis.  I have instructed him to follow-up with his oncologist at [facility].  In addition, he will return to see Dr Jens Som in the next weeks.  He will contact our office if he worsens in the interim.  Problem # 3:  HYPERTENSION (ICD-401.9) stable  Problem # 4:  LBBB (ICD-426.3) I am concerned that first degree AV block and LBBB may represent cardiac involvement of amyloidosis.  He has no sypmtoms of AV block thus far.  He will follow-up with oncology at [facility].  Depending on his longterm prognosis, pacemaker implantation may be required.  We will discuss this further after he sees his oncologist upon return.  Other Orders: EKG w/ Interpretation (93000)  Patient Instructions: 1)  Your physician recommends that you schedule a follow-up appointment in: 2 months with Dr Johney Frame and 3 weeks Dr Jens Som 2)  Your physician has recommended you make the following change in your medication: stop Amiodarone

## 2011-02-18 NOTE — Progress Notes (Deleted)
Summary: [facility]: Office Visit   [facility]: Office Visit   Imported By: Earl Many 02/12/2011 17:57:25  _____________________________________________________________________  External Attachment:    Type:   Image     Comment:   External Document

## 2011-02-19 LAB — PROTEIN ELECTROPHORESIS, SERUM
Albumin ELP: 58.9 % (ref 55.8–66.1)
Alpha-1-Globulin: 4.1 % (ref 2.9–4.9)
Alpha-2-Globulin: 13 % — ABNORMAL HIGH (ref 7.1–11.8)
Beta Globulin: 5.6 % (ref 4.7–7.2)
Total Protein, Serum Electrophoresis: 6 g/dL (ref 6.0–8.3)

## 2011-02-19 LAB — KAPPA/LAMBDA LIGHT CHAINS: Kappa:Lambda Ratio: 1.94 — ABNORMAL HIGH (ref 0.26–1.65)

## 2011-02-23 ENCOUNTER — Telehealth (INDEPENDENT_AMBULATORY_CARE_PROVIDER_SITE_OTHER): Payer: Self-pay | Admitting: *Deleted

## 2011-02-23 NOTE — Progress Notes (Signed)
Summary: difficulty breathing - go to ED  Phone Note Call from Patient Call back at Home Phone [phone]   Caller: Patient Call For: nadel Summary of Call: pt wants to be seen today- c/o SOB.  Initial call taken by: Tivis Ringer, CNA,  February 17, 2011 10:58 AM  Follow-up for Phone Call        Called, spoke with pt.  States he is having a difficult time getting a deep breath.  Sxs worse when laying down.  Started x 2-3 wks ago but "getting quite worse."  Requesting to be seen today "before I quit breathing."  Advised pt if he feels his sxs are this urgent he needs to call 911 to go to ED for eval.  He declined this stating " I do not want to go to the hospital.  Lets see what Dr. Kriste Basque can do for me first."    [facility] - verified [facility]  **Note:  Pt was last seen 10/2009. Follow-up by: Gweneth Dimitri RN,  February 17, 2011 11:09 AM  Additional Follow-up for Phone Call Additional follow up Details #1::        per SN-----we have no openings today----he will need to go to the ER for eval----could be his heart and needs this evaluated.  thanks Randell Loop CMA  February 17, 2011 11:57 AM   [facility] Gweneth Dimitri RN  February 17, 2011 12:04 PM   New Problems: OBSTRUCTIVE SLEEP APNEA (ICD-327.23)   Additional Follow-up for Phone Call Additional follow up Details #2::    pt called back again. no answer in triage (lunchtime). since i felt pt didn't need to wait for a return call i advised pt re: dr Jodelle Green recs to go to ER or call 911 re: eval-heart as dr Kriste Basque would not be able to see pt today. pt said "alright- thank you" and hung up. Tivis Ringer, CNA  February 17, 2011 1:24 PM    Additional Follow-up for Phone Call Additional follow up Details #3:: Details for Additional Follow-up Action Taken: Spoke with pt and notified that needs to go to ED for eval.  He verbalized understanding.  He is now requesting order for "CPAP to be started now, like, today" for his OSA.  He states that  he had PSG per SN which showed OSA and need for CPAP back in 2010, and he did not think "it was a big deal until now".  He is demanding that this be started today.  I advised will forward to SN.  Pls advise thanks  LMOMTCB Vernie Murders  February 17, 2011 2:25 PM  Spoke with pt and notified of the above. He verbalized understanding. Consult with [facility] sched for 3/23at 10:15 am Vernie Murders  February 17, 2011 3:15 PM  Additional Follow-up by: Vernie Murders,  February 17, 2011 1:40 PM  New Problems: OBSTRUCTIVE SLEEP APNEA (ICD-327.23) per SN----we will send order for cpap set up--setting at 10---this has been sent to [facility]----he will need sleep consult with Dr. Craige Cotta ---thanks Randell Loop CMA  February 17, 2011 2:19 PM

## 2011-02-23 NOTE — Progress Notes (Signed)
Summary: pt SOB and needs a callback asap  Phone Note Call from Patient Call back at Home Phone [phone]   Caller: Patient Reason for Call: Talk to Nurse, Talk to Doctor Summary of Call: Pt request call / pt has SOB and thinks he needs to be admitted to hospital Initial call taken by: Judie Grieve,  February 17, 2011 1:48 PM  Follow-up for Phone Call        [facility] for patient to call back  Patient has been f/u with Dr. Jodelle Green office and it seems that CPAP was ordered per Alroy Dust from reading the phone note. Will try to call patient again but it seems like this has been addressed. Trish and ER notified. Whitney Maeola Sarah RN  February 17, 2011 4:18 PM  Follow-up by: Dessie Coma  LPN,  February 17, 2011 4:10 PM

## 2011-02-24 ENCOUNTER — Encounter: Payer: Self-pay | Admitting: *Deleted

## 2011-02-24 ENCOUNTER — Other Ambulatory Visit: Payer: Self-pay | Admitting: Oncology

## 2011-02-24 ENCOUNTER — Encounter (HOSPITAL_BASED_OUTPATIENT_CLINIC_OR_DEPARTMENT_OTHER): Payer: Medicare Other | Admitting: Oncology

## 2011-02-24 DIAGNOSIS — E8589 Other amyloidosis: Secondary | ICD-10-CM

## 2011-02-24 DIAGNOSIS — Z5112 Encounter for antineoplastic immunotherapy: Secondary | ICD-10-CM

## 2011-02-24 LAB — CBC WITH DIFFERENTIAL/PLATELET
Basophils Absolute: 0 10*3/uL (ref 0.0–0.1)
Eosinophils Absolute: 0 10*3/uL (ref 0.0–0.5)
HCT: 44.1 % (ref 38.4–49.9)
HGB: 14.6 g/dL (ref 13.0–17.1)
LYMPH%: 11.4 % — ABNORMAL LOW (ref 14.0–49.0)
MONO#: 0.9 10*3/uL (ref 0.1–0.9)
NEUT#: 6.7 10*3/uL — ABNORMAL HIGH (ref 1.5–6.5)
Platelets: 178 10*3/uL (ref 140–400)
RBC: 4.69 10*6/uL (ref 4.20–5.82)
WBC: 8.7 10*3/uL (ref 4.0–10.3)
nRBC: 0 % (ref 0–0)

## 2011-02-24 LAB — BASIC METABOLIC PANEL
CO2: 27 mEq/L (ref 19–32)
Chloride: 105 mEq/L (ref 96–112)
Creatinine, Ser: 1.09 mg/dL (ref 0.40–1.50)
Glucose, Bld: 106 mg/dL — ABNORMAL HIGH (ref 70–99)

## 2011-02-25 NOTE — H&P (Signed)
NAME:  Johnny Mitchell, Johnny Mitchell NO.:  [id number]  MEDICAL RECORD NO.:  [id number]           PATIENT TYPE:  E  LOCATION:  [facility]                         FACILITY:  [facility]  PHYSICIAN:  Rollene Rotunda, MD, FACCDATE OF BIRTH:  Jun 01, 1941  DATE OF ADMISSION:  01/17/2011 DATE OF DISCHARGE:                             HISTORY & PHYSICAL   PRIMARY CARE PHYSICIAN:  Lonzo Cloud. Kriste Basque, MD  CARDIOLOGIST:  Madolyn Frieze. Jens Som, MD, [facility]  REASON FOR PRESENTATION:  Evaluate the patient with wide complex tachycardia.  HISTORY OF PRESENT ILLNESS:  The patient is very pleasant 70 year old white gentleman.  He has a history of small vessel coronary disease as described below.  This has been managed medically and has not been symptomatic.  He was evaluated by Dr. Jens Som for dyspnea.  He had a kappa light chain that was elevated and was subsequently found to have amyloidosis.  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  He did have a cardiac workup to include an MRI, which was nondiagnostic for evidence of amyloid.  He had been doing very well since his transplant and has had no cardiac issues.  However, this morning he awoke with a sensation of epigastric discomfort and "heartburn."  He felt clammy.  He was chilled, yet sweaty.  He did not have any chest pressure, neck, or arm discomfort.  He did not notice any palpitations and had no presyncope or syncope.  He was mildly short of breath, but not excessively so.  He has not been having any PND or orthopnea.  He called EMS and was noted to have wide complex tachycardia with a rate of approximately 220.  There is left axis.  He was treated with IV amiodarone.  He converted to sinus rhythm with premature atrial contractions.  He does have an underlying left bundle-branch block.  He previously had an interventricular conduction delay.  He is not quite comfortable.  Point-of-care markers thus far x1 have been negative.  PAST  MEDICAL HISTORY:  Hypertension, coronary artery disease (70-80% OM stenosis 2011), degenerative joint disease, hyperlipidemia, IVCD, amyloidosis, status post stem cell transplant, previous hemorrhoids.  PAST SURGICAL HISTORY:  Knee arthroscopy, carpal tunnel surgery, bone marrow transplant.  ALLERGIES/INTOLERANCES:  None.  MEDICATIONS: 1. Atenolol 100 mg p.o. daily. 2. Acyclovir 800 mg b.i.d. 3. Spironolactone 25 mg daily. 4. Oxycodone.  SOCIAL HISTORY:  The patient is married.  Never smoked cigarettes. Drinks occasional alcohol.  FAMILY HISTORY:  Noncontributory for early coronary disease.  REVIEW OF SYSTEMS:  As stated in the HPI, and otherwise negative for all other systems.  PHYSICAL EXAMINATION:  GENERAL:  The patient is pleasant and in no distress. VITAL SIGNS:  Blood pressure 107/68, heart rate 77 and regular, afebrile. HEENT:  Eyelids unremarkable, pupils equal, round, react to light, fundi not visualized, oral mucosa unremarkable. NECK:  No jugular venous distention at 45 degrees.  Carotid upstrokebrisk and symmetric.  No bruits.  No thyromegaly. LYMPHATICS:  No cervical, axillary, or inguinal adenopathy. LUNGS:  Clear to auscultation bilaterally. BACK:  No costovertebral tenderness. CHEST:  Unremarkable. HEART:  PMI not displaced or  sustained.  S1 and S2 within normal limits. No S3, no S4.  No clicks, no rubs, no murmurs. ABDOMEN:  Obese, positive bowel sounds, normal in frequency and pitch, no bruits, no rebound, no guarding, no midline pulsatile mass, no hepatomegaly, no splenomegaly. SKIN:  No rashes, no nodules. EXTREMITIES:  Pulses 2+ throughout, trace bilateral lower extremity edema. NEURO:  Oriented to person, place, time.  Cranial nerves II through XII grossly intact, motor grossly intact.  EKG, sinus rhythm with multifocal premature atrial contractions, left bundle-branch block, left axis deviation.  LABORATORY DATA:  WBC 10.2, hemoglobin 13.6,  platelets 165.  Sodium 142, potassium 4.0, BUN 18, creatinine 0.96.  ASSESSMENT AND PLAN: 1. Tachycardia.  The patient had a very rapid wide complex rhythm.     Though the morphology is very similar to his underlying conduction     defect, I still suspect ventricular tachycardia.  I will continue     the amiodarone.  I will check TSH and magnesium.  He will get an     echocardiogram.  I will cycle cardiac enzymes.  EP consultation     will be indicated. 2. Amyloidosis.  The patient is concerned because he has had some     elevation of his kappa free light chains, and he is to undergo     chemotherapy on Thursday.  If this evaluation interferes with that,     we will need to coordinate with the oncologist. 3. Hypertension.  His blood pressure is controlled on current regimen.     He will continue this. 4. Coronary disease as above.  I will cycle enzymes, but doubt any     acute coronary involvement.     Rollene Rotunda, MD, [facility]     JH/MEDQ  D:  01/17/2011  T:  01/17/2011  Job:  [id number]  Electronically Signed by Rollene Rotunda MD [facility] on 02/25/2011 09:54:30 AM

## 2011-03-02 LAB — BONE MARROW EXAM

## 2011-03-02 NOTE — Progress Notes (Signed)
Summary: status of cpap set up  Phone Note Call from Patient Call back at Home Phone [phone]   Caller: Patient Call For: nadel Summary of Call: Checking on the status of his cpap set up. Initial call taken by: Darletta Moll,  February 23, 2011 12:48 PM  Follow-up for Phone Call        Order was given to [facility] for CPAP on 02/17/11 and pt ha not heard from them.  Will forward to [facility]. Pls advise thanks Vernie Murders  February 23, 2011 2:32 PM   lmtcb with lecretia! [facility]/pt will be called today for this cpap set up appt  Follow-up by: Oneita Jolly,  February 23, 2011 3:50 PM

## 2011-03-03 ENCOUNTER — Telehealth: Payer: Self-pay | Admitting: Pulmonary Disease

## 2011-03-03 ENCOUNTER — Encounter (HOSPITAL_BASED_OUTPATIENT_CLINIC_OR_DEPARTMENT_OTHER): Payer: Medicare Other | Admitting: Oncology

## 2011-03-03 ENCOUNTER — Other Ambulatory Visit: Payer: Self-pay | Admitting: Oncology

## 2011-03-03 DIAGNOSIS — E8589 Other amyloidosis: Secondary | ICD-10-CM

## 2011-03-03 DIAGNOSIS — Z5112 Encounter for antineoplastic immunotherapy: Secondary | ICD-10-CM

## 2011-03-03 LAB — CREATININE, SERUM
Creatinine, Ser: 1.12 mg/dL (ref 0.4–1.5)
GFR calc Af Amer: >60 mL/min (ref >60)

## 2011-03-03 LAB — CBC WITH DIFFERENTIAL/PLATELET
Basophils Absolute: 0 10*3/uL (ref 0.0–0.1)
Eosinophils Absolute: 0 10*3/uL (ref 0.0–0.5)
HGB: 14.7 g/dL (ref 13.0–17.1)
MCV: 94 fL (ref 79.3–98.0)
MONO#: 0.7 10*3/uL (ref 0.1–0.9)
MONO%: 7.7 % (ref 0.0–14.0)
NEUT#: 7.4 10*3/uL — ABNORMAL HIGH (ref 1.5–6.5)
RBC: 4.66 10*6/uL (ref 4.20–5.82)
RDW: 13.9 % (ref 11.0–14.6)
WBC: 9.4 10*3/uL (ref 4.0–10.3)
lymph#: 1.2 10*3/uL (ref 0.9–3.3)

## 2011-03-03 LAB — MAGNESIUM: Magnesium: 1.8 mg/dL (ref 1.5–2.5)

## 2011-03-03 LAB — BASIC METABOLIC PANEL
Calcium: 9.2 mg/dL (ref 8.4–10.5)
Sodium: 141 mEq/L (ref 135–145)

## 2011-03-03 NOTE — Telephone Encounter (Signed)
Order was given to [facility] 02/19/11 nad they are asking for ff notes pt is aware Johnny Mitchell

## 2011-03-03 NOTE — Telephone Encounter (Signed)
lmomtcb x1 looks like [facility] was sent his information on 02/23/11  [place], [place]

## 2011-03-03 NOTE — Telephone Encounter (Signed)
Pt called back and states [facility] states they never received the paper work for pt to be set up on cpap machine. I advised pt that our records show it was sent on 02/23/11. Pt states he spoke with [facility] on Friday and states they have received anything from our office and requested this information a 2nd time. Will send to [facility] to see if they have received a 2nd request on this patient.  [place], [place]

## 2011-03-05 ENCOUNTER — Encounter: Payer: Self-pay | Admitting: Pulmonary Disease

## 2011-03-05 ENCOUNTER — Ambulatory Visit (INDEPENDENT_AMBULATORY_CARE_PROVIDER_SITE_OTHER): Payer: Medicare Other | Admitting: Pulmonary Disease

## 2011-03-05 VITALS — BP 130/88 | HR 61 | Temp 98.1°F | Ht 71.0 in | Wt 263.8 lb

## 2011-03-05 DIAGNOSIS — G4733 Obstructive sleep apnea (adult) (pediatric): Secondary | ICD-10-CM

## 2011-03-05 NOTE — Progress Notes (Signed)
Subjective:    Patient ID: Johnny Mitchell, male    DOB: September 16, 1941, 70 y.o.   MRN: [id number]  HPI 70 yo male for sleep evaluation.  He has sleep test in 2010 which showed moderate sleep apnea.  He has never been on therapy for this.    He snores, and has been told he stops breathing while asleep.  He wakes up with a choking sensation at times, and has more trouble on his back.  He goes to bed usually around 9pm.  He does not use anything to help sleep.  He wakes up once to use the bathroom.  He gets out of bed at 7am.  He feels okay in the morning and denies headaches.  He does not take naps.  He is not using anything to help him stay awake.  He denies sleep walking, sleep talking, nightmares, or bruxism.  There is no history of restless legs.  He denies sleep hallucinations, sleep paralysis, or cataplexy.  There is no history of thyroid disease.  His weight has been steady.  Past Medical History  Diagnosis Date  . Hypertension   . Coronary atherosclerosis of unspecified type of vessel, native or graft   . Cerebrovascular disease, unspecified   . Pure hypercholesterolemia   . Diverticulosis of colon (without mention of hemorrhage)   . Benign neoplasm of colon   . Unspecified hemorrhoids without mention of complication   . Degenerative disc disease   . 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 Low back pain   . Hemangioma   . Amyloidosis   . LBBB (left bundle branch block)   . 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 OSA (obstructive sleep apnea)      Family History  Problem Relation Age of Onset  . Cancer Mother     Lung  . Cancer Father     Melanoma     History   Social History  . Marital Status: Married    Spouse Name: N/A    Number of Children: 2  . Years of Education: N/A   Occupational History  . PRESIDENT     [employer]   Social History Main Topics  . Smoking status: Never Smoker   .  Smokeless tobacco: Not on file  . Alcohol Use: Yes  . Drug Use: Not on file  . Sexually Active: Not on file   Other Topics Concern  . Not on file   Social History Narrative  . No narrative on file     No Known Allergies   Outpatient Prescriptions Prior to Visit  Medication Sig Dispense Refill  . acyclovir (ZOVIRAX) 800 MG tablet Take 800 mg by mouth 2 (two) times daily.        Marland Kitchen atenolol (TENORMIN) 100 MG tablet Take 100 mg by mouth daily.        Marland Kitchen oxyCODONE (OXYCONTIN) 10 MG 12 hr tablet Take as needed       . spironolactone (ALDACTONE) 25 MG tablet Take 25 mg by mouth daily.             Review of Systems  Constitutional: Negative for fever, appetite change, fatigue and unexpected weight change.  HENT: Negative for congestion, sore throat, rhinorrhea, mouth sores, dental problem and postnasal drip.   Eyes: Negative for itching.  Respiratory: Positive for apnea and shortness of breath. Negative for cough.   Cardiovascular: Negative for chest pain  and leg swelling.  Gastrointestinal: Negative for abdominal pain.  Musculoskeletal: Positive for back pain.  Skin: Negative for rash.  Neurological: Negative for headaches.  Hematological: Negative for adenopathy.  Psychiatric/Behavioral: Positive for sleep disturbance.       Objective:   Physical Exam  Constitutional: He is oriented to person, place, and time. He appears well-developed and well-nourished. No distress.  HENT:  Nose: Nose normal.  Mouth/Throat: Oropharynx is clear and moist. No oropharyngeal exudate.       MP 4  Eyes: EOM are normal. Pupils are equal, round, and reactive to light. Left eye exhibits no discharge. No scleral icterus.  Neck: Neck supple. No JVD present. No tracheal deviation present. No thyromegaly present.  Cardiovascular: Normal rate, regular rhythm, normal heart sounds and intact distal pulses.   No murmur heard. Pulmonary/Chest: Effort normal and breath sounds normal.  Abdominal: Soft.  Bowel sounds are normal.  Musculoskeletal: He exhibits no edema.  Lymphadenopathy:    He has no cervical adenopathy.  Neurological: He is alert and oriented to person, place, and time. No cranial nerve deficit.  Skin: Skin is warm and dry.  Psychiatric: He has a normal mood and affect.          Assessment & Plan:   OBSTRUCTIVE SLEEP APNEA He has moderate sleep apnea.  He has history of sleep disruption and cardiovascular disease.  I reviewed his sleep test.  Treatment options were discussed.  Explained the importance of weight loss and driving precautions were discussed.  Will arrange for auto-CPAP titration at home.  If this is unsuccessful, then he will need an in lab study.    Updated Medication List Outpatient Encounter Prescriptions as of 03/05/2011  Medication Sig Dispense Refill  . acyclovir (ZOVIRAX) 800 MG tablet Take 800 mg by mouth 2 (two) times daily.        Marland Kitchen atenolol (TENORMIN) 100 MG tablet Take 100 mg by mouth daily.        Marland Kitchen oxyCODONE (OXYCONTIN) 10 MG 12 hr tablet Take as needed       . spironolactone (ALDACTONE) 25 MG tablet Take 25 mg by mouth daily.

## 2011-03-05 NOTE — Patient Instructions (Signed)
Will set up CPAP at home.

## 2011-03-05 NOTE — Assessment & Plan Note (Signed)
He has moderate sleep apnea.  He has history of sleep disruption and cardiovascular disease.  I reviewed his sleep test.  Treatment options were discussed.  Explained the importance of weight loss and driving precautions were discussed.  Will arrange for auto-CPAP titration at home.  If this is unsuccessful, then he will need an in lab study.

## 2011-03-10 ENCOUNTER — Telehealth: Payer: Self-pay | Admitting: Pulmonary Disease

## 2011-03-10 DIAGNOSIS — G4733 Obstructive sleep apnea (adult) (pediatric): Secondary | ICD-10-CM

## 2011-03-10 NOTE — Telephone Encounter (Signed)
Dr. Kriste Basque put in order for CPAP 10 cm H2O, but this is not the correct order.  The correct order should be auto-CPAP with pressure range 5 to 20 cm H2O with heated humidity and mask of choice.

## 2011-03-10 NOTE — Telephone Encounter (Signed)
Called, spoke with pt.  States cpap was supposed to be set on auto however the order to [facility] was sent for a set pressure of 10.  Pt requesting correct order to be sent to [facility].  Dr. Craige Cotta, pls clarify if order to [facility] was supposed to be for auto.

## 2011-03-10 NOTE — Telephone Encounter (Signed)
New order sent to [facility]. Left a detailed msg to inform the patient.

## 2011-03-11 NOTE — Telephone Encounter (Signed)
Thanks

## 2011-03-14 ENCOUNTER — Encounter: Payer: Self-pay | Admitting: Cardiology

## 2011-03-15 LAB — CHROMOSOME ANALYSIS, BONE MARROW

## 2011-03-15 LAB — TISSUE HYBRIDIZATION (BONE MARROW)-NCBH

## 2011-03-17 ENCOUNTER — Encounter (HOSPITAL_BASED_OUTPATIENT_CLINIC_OR_DEPARTMENT_OTHER): Payer: Medicare Other | Admitting: Oncology

## 2011-03-17 ENCOUNTER — Other Ambulatory Visit: Payer: Self-pay | Admitting: Oncology

## 2011-03-17 DIAGNOSIS — M545 Low back pain: Secondary | ICD-10-CM

## 2011-03-17 DIAGNOSIS — E8589 Other amyloidosis: Secondary | ICD-10-CM

## 2011-03-17 DIAGNOSIS — Z5112 Encounter for antineoplastic immunotherapy: Secondary | ICD-10-CM

## 2011-03-17 LAB — CBC WITH DIFFERENTIAL/PLATELET
BASO%: 0.4 % (ref 0.0–2.0)
EOS%: 0.7 % (ref 0.0–7.0)
HCT: 45.2 % (ref 38.4–49.9)
LYMPH%: 15.1 % (ref 14.0–49.0)
MCH: 31.5 pg (ref 27.2–33.4)
MCHC: 33.4 g/dL (ref 32.0–36.0)
MONO#: 1.1 10*3/uL — ABNORMAL HIGH (ref 0.1–0.9)
NEUT%: 71.5 % (ref 39.0–75.0)
RBC: 4.79 10*6/uL (ref 4.20–5.82)
WBC: 9.1 10*3/uL (ref 4.0–10.3)
lymph#: 1.4 10*3/uL (ref 0.9–3.3)
nRBC: 0 % (ref 0–0)

## 2011-03-17 LAB — POCT I-STAT 3, ART BLOOD GAS (G3+)
O2 Saturation: 93 %
TCO2: 28 mmol/L (ref 0–100)
pCO2 arterial: 42.5 mmHg (ref 35.0–45.0)
pH, Arterial: 7.41 (ref 7.350–7.450)

## 2011-03-17 LAB — POCT I-STAT 3, VENOUS BLOOD GAS (G3P V)
pCO2, Ven: 42.2 mmHg — ABNORMAL LOW (ref 45.0–50.0)
pH, Ven: 7.367 — ABNORMAL HIGH (ref 7.250–7.300)

## 2011-03-24 ENCOUNTER — Other Ambulatory Visit: Payer: Self-pay | Admitting: Oncology

## 2011-03-24 ENCOUNTER — Encounter (HOSPITAL_BASED_OUTPATIENT_CLINIC_OR_DEPARTMENT_OTHER): Payer: Medicare Other | Admitting: Oncology

## 2011-03-24 DIAGNOSIS — Z5112 Encounter for antineoplastic immunotherapy: Secondary | ICD-10-CM

## 2011-03-24 DIAGNOSIS — D72829 Elevated white blood cell count, unspecified: Secondary | ICD-10-CM

## 2011-03-24 LAB — CBC WITH DIFFERENTIAL/PLATELET
Basophils Absolute: 0 10*3/uL (ref 0.0–0.1)
Eosinophils Absolute: 0 10*3/uL (ref 0.0–0.5)
HCT: 41.1 % (ref 38.4–49.9)
HGB: 13.8 g/dL (ref 13.0–17.1)
LYMPH%: 8.8 % — ABNORMAL LOW (ref 14.0–49.0)
MONO#: 0.8 10*3/uL (ref 0.1–0.9)
NEUT%: 81.2 % — ABNORMAL HIGH (ref 39.0–75.0)
Platelets: 146 10*3/uL (ref 140–400)
WBC: 8.8 10*3/uL (ref 4.0–10.3)
lymph#: 0.8 10*3/uL — ABNORMAL LOW (ref 0.9–3.3)

## 2011-03-24 LAB — BASIC METABOLIC PANEL
BUN: 20 mg/dL (ref 6–23)
CO2: 27 mEq/L (ref 19–32)
Chloride: 106 mEq/L (ref 96–112)
Creatinine, Ser: 0.91 mg/dL (ref 0.40–1.50)
Glucose, Bld: 93 mg/dL (ref 70–99)

## 2011-03-25 ENCOUNTER — Ambulatory Visit (INDEPENDENT_AMBULATORY_CARE_PROVIDER_SITE_OTHER): Payer: Medicare Other | Admitting: Cardiology

## 2011-03-25 ENCOUNTER — Encounter: Payer: Self-pay | Admitting: Cardiology

## 2011-03-25 DIAGNOSIS — I10 Essential (primary) hypertension: Secondary | ICD-10-CM

## 2011-03-25 DIAGNOSIS — E859 Amyloidosis, unspecified: Secondary | ICD-10-CM

## 2011-03-25 DIAGNOSIS — I2510 Atherosclerotic heart disease of native coronary artery without angina pectoris: Secondary | ICD-10-CM

## 2011-03-25 DIAGNOSIS — I471 Supraventricular tachycardia: Secondary | ICD-10-CM

## 2011-03-25 DIAGNOSIS — E7800 Pure hypercholesterolemia, unspecified: Secondary | ICD-10-CM

## 2011-03-25 MED ORDER — PRAVASTATIN SODIUM 40 MG PO TABS: 40.0000 mg | ORAL_TABLET | Freq: Every evening | ORAL | Status: DC

## 2011-03-25 NOTE — Assessment & Plan Note (Signed)
Resume enteric-coated aspirin 81 mg daily. We'll also add statin. Begin Pravachol 40 mg daily and check lipids and liver in 6 weeks.

## 2011-03-25 NOTE — Progress Notes (Signed)
HPI: Pleasant gentleman with h/o SVT, Amyloid (s/p transplant), nonobstructive CAD for f/u. Cardiac catheterization in November of 2010 revealed normal filling pressures and a 70-80 % OM which is unchanged compared to previous. Cardiac MRI in January of 2011 showed an ejection fraction of 57%, mild biatrial enlargement, mild right ventricular enlargement. Cardiac amyloid could not be definitively excluded. Echocardiogram in February of 2012 showed an ejection fraction of 50% and a mildly dilated right ventricle. Patient admitted in February of 2012 with a wide complex tachycardia. Seen by electrophysiology and felt most likely SVT with aberrancy. Treated transiently with amiodarone but now on beta blockade. It was felt that if symptoms recur ablation would be warranted. He also was noted to have some dyspnea at that time. Since he was last seen, the patient has dyspnea with more extreme activities but not with routine activities. It is relieved with rest. It is not associated with chest pain. There is no orthopnea, PND. There is no syncope or palpitations. There is no exertional chest pain. There is mild pedal edema.   Current Outpatient Prescriptions  Medication Sig Dispense Refill  . acyclovir (ZOVIRAX) 800 MG tablet 400 mg. 1/2 tab po qd      . atenolol (TENORMIN) 100 MG tablet Take 100 mg by mouth daily.        Marland Kitchen oxyCODONE (OXYCONTIN) 10 MG 12 hr tablet Take as needed       . spironolactone (ALDACTONE) 25 MG tablet Take 25 mg by mouth daily.        Marland Kitchen aspirin EC 81 MG EC tablet Take 1 tablet (81 mg total) by mouth daily.  150 tablet  2  . pravastatin (PRAVACHOL) 40 MG tablet Take 1 tablet (40 mg total) by mouth every evening.  30 tablet  11     Past Medical History  Diagnosis Date  . Hypertension   . Coronary atherosclerosis of unspecified type of vessel, native or graft   . Cerebrovascular disease, unspecified   . Pure hypercholesterolemia   . Diverticulosis of colon (without mention of  hemorrhage)   . Benign neoplasm of colon   . Unspecified hemorrhoids without mention of complication   . Degenerative disc disease   . 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 Low back pain   . Hemangioma   . Amyloidosis   . LBBB (left bundle branch block)   . 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 OSA (obstructive sleep apnea)     Past Surgical History  Procedure Date  . Knee arthroscopy   . Carpal tunnel release 08/2006    Dr Thad Ranger at [facility] - Bilateral  . 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 Marital Status: Married    Spouse Name: N/A    Number of Children: 2  . Years of Education: N/A   Occupational History  . PRESIDENT     [employer]   Social History Main Topics  . Smoking status: Never Smoker   . Smokeless tobacco: Not on file  . Alcohol Use: Yes  . Drug Use: Not on file  . Sexually Active: Not on file   Other Topics Concern  . Not on file   Social History Narrative  . No narrative on file    ROS: Problems with low back pain but no fevers or chills, productive cough, hemoptysis, dysphasia, odynophagia, melena, hematochezia, dysuria, hematuria, rash, seizure activity,  orthopnea, PND, claudication. Remaining systems are negative.  Physical Exam: Well-developed well-nourished in no acute distress.  Skin is warm and dry.  HEENT is normal.  Neck is supple. No thyromegaly.  Chest is clear to auscultation with normal expansion.  Cardiovascular exam is regular rate and rhythm.  Abdominal exam nontender or distended. No masses palpated. Extremities show 1+ edema. neuro grossly intact

## 2011-03-25 NOTE — Assessment & Plan Note (Signed)
Continue beta blocker. Will refer for ablation if he has recurrent symptoms despite beta blockade.

## 2011-03-25 NOTE — Assessment & Plan Note (Signed)
Blood pressure controlled with present medications. Will continue. 

## 2011-03-25 NOTE — Assessment & Plan Note (Signed)
Resume statin. Lipids and liver in 6 weeks.

## 2011-03-25 NOTE — Patient Instructions (Addendum)
Your physician recommends that you schedule a follow-up appointment in: 6 months  LIPID/LIVER CHECK IN 6 WEEKS=MAY 21ST   RESTART ASPIRIN 81MG  ONCE DAILY  START PRAVASTATIN 40MG  ONCE DAILY

## 2011-03-25 NOTE — Assessment & Plan Note (Signed)
Managed by hematology oncology. 

## 2011-03-31 ENCOUNTER — Encounter: Payer: Self-pay | Admitting: Cardiology

## 2011-03-31 ENCOUNTER — Other Ambulatory Visit: Payer: Self-pay | Admitting: Oncology

## 2011-03-31 ENCOUNTER — Encounter (HOSPITAL_BASED_OUTPATIENT_CLINIC_OR_DEPARTMENT_OTHER): Payer: Medicare Other | Admitting: Oncology

## 2011-03-31 DIAGNOSIS — E8589 Other amyloidosis: Secondary | ICD-10-CM

## 2011-03-31 DIAGNOSIS — Z5112 Encounter for antineoplastic immunotherapy: Secondary | ICD-10-CM

## 2011-03-31 LAB — CBC WITH DIFFERENTIAL/PLATELET
Basophils Absolute: 0 10*3/uL (ref 0.0–0.1)
EOS%: 0.4 % (ref 0.0–7.0)
HCT: 42 % (ref 38.4–49.9)
HGB: 14.1 g/dL (ref 13.0–17.1)
MCH: 32.4 pg (ref 27.2–33.4)
NEUT%: 80.9 % — ABNORMAL HIGH (ref 39.0–75.0)
lymph#: 0.7 10*3/uL — ABNORMAL LOW (ref 0.9–3.3)

## 2011-03-31 LAB — BASIC METABOLIC PANEL
BUN: 18 mg/dL (ref 6–23)
CO2: 28 mEq/L (ref 19–32)
Chloride: 107 mEq/L (ref 96–112)
Creatinine, Ser: 1.09 mg/dL (ref 0.40–1.50)

## 2011-04-01 ENCOUNTER — Encounter: Payer: Medicare Other | Admitting: Oncology

## 2011-04-05 LAB — PROTEIN ELECTROPHORESIS, SERUM
Albumin ELP: 60.4 % (ref 55.8–66.1)
Beta 2: 4.5 % (ref 3.2–6.5)
Gamma Globulin: 13 % (ref 11.1–18.8)

## 2011-04-06 ENCOUNTER — Encounter (HOSPITAL_BASED_OUTPATIENT_CLINIC_OR_DEPARTMENT_OTHER): Payer: Medicare Other | Admitting: Oncology

## 2011-04-06 ENCOUNTER — Other Ambulatory Visit (HOSPITAL_COMMUNITY)
Admission: RE | Admit: 2011-04-06 | Discharge: 2011-04-06 | Disposition: A | Discharge status: Home / Self Care | Payer: Medicare Other | Source: Ambulatory Visit | Attending: Oncology | Admitting: Oncology

## 2011-04-06 ENCOUNTER — Other Ambulatory Visit: Payer: Self-pay | Admitting: Oncology

## 2011-04-06 DIAGNOSIS — E859 Amyloidosis, unspecified: Secondary | ICD-10-CM | POA: Insufficient documentation

## 2011-04-06 LAB — DIFFERENTIAL
Eosinophils Absolute: 0.1 10*3/uL (ref 0.0–0.7)
Eosinophils Relative: 1 % (ref 0–5)
Lymphs Abs: 1.2 10*3/uL (ref 0.7–4.0)
Monocytes Relative: 11 % (ref 3–12)

## 2011-04-06 LAB — CBC
MCH: 31.3 pg (ref 26.0–34.0)
MCV: 96.3 fL (ref 78.0–100.0)
Platelets: 168 10*3/uL (ref 150–400)
RDW: 13.6 % (ref 11.5–15.5)

## 2011-04-14 ENCOUNTER — Other Ambulatory Visit: Payer: Self-pay | Admitting: Oncology

## 2011-04-14 ENCOUNTER — Encounter (HOSPITAL_BASED_OUTPATIENT_CLINIC_OR_DEPARTMENT_OTHER): Payer: Medicare Other | Admitting: Oncology

## 2011-04-14 DIAGNOSIS — Z5112 Encounter for antineoplastic immunotherapy: Secondary | ICD-10-CM

## 2011-04-14 DIAGNOSIS — E8589 Other amyloidosis: Secondary | ICD-10-CM

## 2011-04-14 LAB — CBC WITH DIFFERENTIAL/PLATELET
BASO%: 0 % (ref 0.0–2.0)
EOS%: 0.4 % (ref 0.0–7.0)
HCT: 43.1 % (ref 38.4–49.9)
MCH: 32.5 pg (ref 27.2–33.4)
MCHC: 33.7 g/dL (ref 32.0–36.0)
MONO%: 9.6 % (ref 0.0–14.0)
NEUT%: 80.4 % — ABNORMAL HIGH (ref 39.0–75.0)
RDW: 14.7 % — ABNORMAL HIGH (ref 11.0–14.6)
lymph#: 0.9 10*3/uL (ref 0.9–3.3)

## 2011-04-14 NOTE — Letter (Signed)
Summary: [facility]  [facility]: Office Visit   Imported By: Earl Many 02/12/2011 17:57:25  _____________________________________________________________________  External Attachment:    Type:   Image     Comment:   External Document

## 2011-04-16 LAB — PROTEIN ELECTROPHORESIS, SERUM
Albumin ELP: 58.6 % (ref 55.8–66.1)
Alpha-1-Globulin: 4.1 % (ref 2.9–4.9)
Beta 2: 5 % (ref 3.2–6.5)
Total Protein, Serum Electrophoresis: 6.1 g/dL (ref 6.0–8.3)

## 2011-04-16 LAB — COMPREHENSIVE METABOLIC PANEL
Albumin: 3.9 g/dL (ref 3.5–5.2)
Alkaline Phosphatase: 90 U/L (ref 39–117)
BUN: 27 mg/dL — ABNORMAL HIGH (ref 6–23)
CO2: 22 mEq/L (ref 19–32)
Chloride: 108 mEq/L (ref 96–112)
Glucose, Bld: 92 mg/dL (ref 70–99)
Sodium: 141 mEq/L (ref 135–145)
Total Bilirubin: 0.6 mg/dL (ref 0.3–1.2)

## 2011-04-16 LAB — KAPPA/LAMBDA LIGHT CHAINS
Kappa free light chain: 1.73 mg/dL (ref 0.33–1.94)
Kappa:Lambda Ratio: 1.7 — ABNORMAL HIGH (ref 0.26–1.65)
Lambda Free Lght Chn: 1.02 mg/dL (ref 0.57–2.63)

## 2011-04-21 ENCOUNTER — Other Ambulatory Visit: Payer: Self-pay | Admitting: Oncology

## 2011-04-21 ENCOUNTER — Encounter (HOSPITAL_BASED_OUTPATIENT_CLINIC_OR_DEPARTMENT_OTHER): Payer: Medicare Other | Admitting: Oncology

## 2011-04-21 DIAGNOSIS — E8589 Other amyloidosis: Secondary | ICD-10-CM

## 2011-04-21 DIAGNOSIS — Z5112 Encounter for antineoplastic immunotherapy: Secondary | ICD-10-CM

## 2011-04-21 LAB — CBC WITH DIFFERENTIAL/PLATELET
BASO%: 0.1 % (ref 0.0–2.0)
EOS%: 0.3 % (ref 0.0–7.0)
HCT: 42.1 % (ref 38.4–49.9)
MCH: 32.7 pg (ref 27.2–33.4)
MCHC: 33.6 g/dL (ref 32.0–36.0)
MONO#: 0.6 10*3/uL (ref 0.1–0.9)
RBC: 4.33 10*6/uL (ref 4.20–5.82)
RDW: 14.1 % (ref 11.0–14.6)
WBC: 8.2 10*3/uL (ref 4.0–10.3)
lymph#: 0.6 10*3/uL — ABNORMAL LOW (ref 0.9–3.3)

## 2011-04-21 LAB — BASIC METABOLIC PANEL
Calcium: 8.8 mg/dL (ref 8.4–10.5)
Glucose, Bld: 114 mg/dL — ABNORMAL HIGH (ref 70–99)
Potassium: 4.3 mEq/L (ref 3.5–5.3)
Sodium: 143 mEq/L (ref 135–145)

## 2011-04-28 ENCOUNTER — Encounter (HOSPITAL_BASED_OUTPATIENT_CLINIC_OR_DEPARTMENT_OTHER): Payer: Medicare Other | Admitting: Oncology

## 2011-04-28 ENCOUNTER — Other Ambulatory Visit: Payer: Self-pay | Admitting: Oncology

## 2011-04-28 DIAGNOSIS — Z5111 Encounter for antineoplastic chemotherapy: Secondary | ICD-10-CM

## 2011-04-28 DIAGNOSIS — E8589 Other amyloidosis: Secondary | ICD-10-CM

## 2011-04-28 DIAGNOSIS — E859 Amyloidosis, unspecified: Secondary | ICD-10-CM

## 2011-04-28 DIAGNOSIS — Z5112 Encounter for antineoplastic immunotherapy: Secondary | ICD-10-CM

## 2011-04-28 LAB — CBC WITH DIFFERENTIAL/PLATELET
BASO%: 0.7 % (ref 0.0–2.0)
Eosinophils Absolute: 0 10*3/uL (ref 0.0–0.5)
HCT: 43.3 % (ref 38.4–49.9)
MCHC: 33.2 g/dL (ref 32.0–36.0)
MONO#: 0.1 10*3/uL (ref 0.1–0.9)
NEUT#: 5.5 10*3/uL (ref 1.5–6.5)
NEUT%: 67.9 % (ref 39.0–75.0)
WBC: 8.1 10*3/uL (ref 4.0–10.3)
lymph#: 2.5 10*3/uL (ref 0.9–3.3)

## 2011-04-28 LAB — BASIC METABOLIC PANEL
CO2: 26 mEq/L (ref 19–32)
Chloride: 105 mEq/L (ref 96–112)
Creatinine, Ser: 0.85 mg/dL (ref 0.40–1.50)
Glucose, Bld: 76 mg/dL (ref 70–99)
Sodium: 139 mEq/L (ref 135–145)

## 2011-04-30 NOTE — Op Note (Signed)
NAME:  Johnny Mitchell, Johnny Mitchell NO.:  [id number]   MEDICAL RECORD NO.:  [id number]          PATIENT TYPE:  AMB   LOCATION:  DSC                          FACILITY:  [facility]   PHYSICIAN:  Mila Homer. Sherlean Foot, M.D. DATE OF BIRTH:  13-Dec-1941   DATE OF PROCEDURE:  07/07/2005  DATE OF DISCHARGE:                                 OPERATIVE REPORT   PREOPERATIVE DIAGNOSIS:  Right knee lateral meniscal tear and  osteoarthritis.   POSTOPERATIVE DIAGNOSIS:  Right knee lateral meniscal tear, medial meniscal  tear, and osteoarthritis.   OPERATION PERFORMED:   SURGEON:  Mila Homer. Sherlean Foot, M.D.   ASSISTANT:  None.   ANESTHESIA:  MAC.   INDICATIONS FOR PROCEDURE:  The patient is a 70 year old white male with  _____ symptoms and MRI showing lateral meniscus complex tear.  Informed  consent was obtained.   DESCRIPTION OF PROCEDURE:  The patient was taken to the operating room and  administered MAC anesthesia.  The right lower extremity was prepped and  draped in the usual sterile fashion.  Inferolateral and inferomedial portals  were created with a #11 blade, blunt trocar and cannula.  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  As we went into the medial compartment, there was a 1 x 2 cm  area of grade 2 and 3 chondromalacia.  This was debrided with a small Great  White shaver and a microfracture performed in its trough.  There were a  couple of small radial posterior horn medial meniscal tears and a straight  basket forceps and Great White shaver was used to perform partial medial  meniscectomy.  I then went into the figure-4 position.  There were some  grade 2 and 3 chondromalacia on the lateral femoral condyle and  chondroplasty was performed here with a small Great White shaver.  There was  a loose body in this joint which was the articular surface of the posterior  portion of the lateral tibial plateau approximately 1 x 1 cm  in diameter.  This was removed with a grasping forceps.  Chondroplasty was performed here  as well.  There was a very complex tear of the lateral meniscus.  I  basically had to remove a portion of the lateral meniscus and the popliteus  hiatus.  I then completed the lateral meniscus and then lavaged the knee.  I  closed with interrupted 4-0 nylon sutures, dressed with Xeroform dressing  sponges, sterile Webril and an Ace wrap.   COMPLICATIONS:  None.   DRAINS:  None.     SDL/MEDQ  D:  07/07/2005  T:  07/07/2005  Job:  [id number]

## 2011-04-30 NOTE — Op Note (Signed)
NAME:  Johnny Mitchell, DINI NO.:  [id number]   MEDICAL RECORD NO.:  [id number]          PATIENT TYPE:  OUT   LOCATION:  DFTL                         FACILITY:  [facility]   PHYSICIAN:  Mila Homer. Sherlean Foot, M.D. DATE OF BIRTH:  15-Aug-1941   DATE OF PROCEDURE:  07/07/2005  DATE OF DISCHARGE:  07/07/2005                                 OPERATIVE REPORT   SURGEON:  Mila Homer. Sherlean Foot, M.D.   ASSISTANT:  None.   ANESTHESIA:  MAC.   PREOPERATIVE DIAGNOSIS:  Right knee osteoarthritis and lateral meniscus  tear.   POSTOPERATIVE DIAGNOSIS:  Right knee osteoarthritis and lateral meniscus  tear.   PROCEDURE:  Right knee arthroscopy with partial lateral meniscectomy and  chondroplasty.   INDICATION FOR PROCEDURE:  The patient is a 70 year old with mechanical  symptoms and more evidence of a lateral meniscus tear.  Informed consent was  obtained.   DESCRIPTION OF PROCEDURE:  The patient was laid supine and administered MAC  anesthesia.  The right lower extremity was prepped and draped in the usual  sterile fashion.  Inferolateral and inferomedial portals were created with a  #11 blade, blunt trocar and cannula.  Diagnostic arthroscopy revealed some  chondromalacia of the patella and medial compartments.  Chondroplasty was  performed with a great white shaver.  I then went into the lateral  compartment in the figure-of-four position and found an anterior horn  lateral meniscus tear.  A straight basket forceps and a great white shaver  were used to perform a partial lateral meniscectomy.  I then lavaged, closed  with 4-0 nylon sutures, dressed with Adaptic, 4 x 4's, sterile Webril and an  Ace wrap.   COMPLICATIONS:  None.   DRAINS:  None.           ______________________________  Mila Homer. Sherlean Foot, M.D.     SDL/MEDQ  D:  08/02/2005  T:  08/02/2005  Job:  [id number]

## 2011-04-30 NOTE — Cardiovascular Report (Signed)
[facility]  Patient:    Johnny Mitchell, Johnny Mitchell                     MRN: [id number] Proc. Date: 03/24/01 Adm. Date:  [id number] Disc. Date: [id number] Attending:  Learta Codding CC:         Lonzo Cloud. Kriste Basque, M.D. [facility]   Cardiac Catheterization  REFERRING PHYSICIAN:  Lonzo Cloud. Kriste Basque, M.D.  CARDIOLOGIST:  Lewayne Bunting, M.D.  PROCEDURES PERFORMED:  Left heart catheterization and ventriculography.  DIAGNOSES:  Single-vessel coronary artery disease with a 90% stenosis of a small second obtuse marginal branch.  HISTORY:  The patient is a 70 year old, white male with a history of recurrent substernal chest pain.  Recent noninvasive testing raised the possibility of possible flow-limiting coronary artery disease.  The patient was scheduled for cardiac catheterization to assess coronary anatomy.  DESCRIPTION OF PROCEDURE:  After informed consent was obtained, the patient was brought to the catheterization laboratory.  The right groin was sterilely prepped and draped.  Lidocaine 1% was used to infiltrate the right groin, and a 6 French arterial sheath was placed using the modified Seldinger technique. Subsequently, a 6 [place] and JR4 catheter was used to engage the left and right coronary arteries.  Selective coronary angiography was performed in various projections using manual injections of contrast.  After selective coronary angiography ventriculography was performed using a 6 French pigtail catheter which was advanced into the left ventricular cavity.  Appropriate hemodynamics were obtained.  Subsequently, ventriculography was performed in single plane RAO projections with power injection of contrast.  At the termination of the case, the catheters and sheath were removed and manual pressure applied until adequate hemostasis was achieved.  The patient tolerated the procedure well and was transferred to the floor in  stable condition.  FINDINGS:  HEMODYNAMICS:  Left ventricular pressure 129/17 mmHg, aortic pressure 139/89 mmHg.  VENTRICULOGRAPHY:  Ejection fraction 60% with no segmental wall motion abnormalities.  SELECTIVE CORONARY ANGIOGRAPHY: 1. Left main coronary was a large caliber vessel with no flow-limiting    coronary artery disease. 2. Left anterior descending artery is a large caliber vessel with 20-30%    plaque in the proximal left anterior descending artery.  The remainder    of the vessel was free of disease. 3. Left circumflex coronary artery was, in its proximal portion, a moderate    caliber vessel.  A small second obtuse marginal branch, which was less    than 2 mm in diameter, had a 90% stenosis. 4. Right coronary artery had no evidence of flow-limiting coronary artery    disease, and is a large caliber vessel.  CONCLUSIONS: 1. Single-vessel coronary artery disease of a small second obtuse marginal    branch. 2. Normal left ventricular systolic function.  RECOMMENDATIONS:  The results were discussed with the patient and his wife. Dr. Kriste Basque was notified about these results.  They were also reviewed with Dr. Gerri Spore and the plan was to proceed with medical therapy including aspirin, beta blocker, and ACE inhibitor, as well as aggressive lipid-lowering therapy, and further risk factor modification.  The second obtuse marginal vessel appeared to be to small to ensure successful long-term result with angioplasty. DD:  03/24/01 TD:  03/24/01 Job: [id number]

## 2011-04-30 NOTE — Letter (Signed)
October 13, 2006    Kermit Balo. Conrad La Playa, MD  [facility]  Department of Neurology  [facility], [place] [address]   Via FAX at [phone]   RE:  Johnny Mitchell, Johnny Mitchell  MRN:  [id number]  /  DOB:  10-17-1941   Dear Dr. Conrad La Porte,   Johnny Mitchell is a  70 year old patient of mine whom I see for general  medical purposes.  He has a history of carpal tunnel symptoms worse on the  right side that date back almost a year.  He was seen by orthopedic hand  surgeons here in [place] and had nerve conductions in February and again  in August 2007, and copies of their notes are included for your review.  The  nerve conductions confirm the carpal tunnel syndrome diagnosis and  orthopedic surgeon had recommended surgery for carpal tunnel release.  The  patient, however, preferred to have several carpal tunnel injections first  and these were performed over the summer by Dr. Merlyn Lot here in [place].  His symptoms worsened however, and a followup nerve conduction was abnormal.  He was seen at [facility] by your  surgeons and had a carpal tunnel release on August 18, 2006.  He was  followed up and has had persistent numbness and discomfort in the median  nerve distribution of the right hand.  He has not gotten satisfactory  explanation from the surgeons and was desirous of having a neurological  consultation regarding his persistent symptoms.  Your evaluation is greatly  appreciated.   As noted, I see Johnny Mitchell for general medical purposes.  He has  hypertension and a history of coronary disease.  He is on 1 buffered aspirin  daily, Atenolol 100 mg daily, Avapro 300 mg daily and Norvasc 5 mg daily.  He has a history of diverticulosis, colon polyps and hemorrhoids.  He had a  followup colonoscopy this summer and repeats every 5 years.  He has a  history of  hypercholesterolemia which he  prefers to treat with diet along being  intolerant to Lipitor in the past.  He has known degenerative arthritis with  a previous right knee arthroscopy.   Thank you very much for your evaluation.  I look forward to hearing your  comments.    Sincerely,      Johnny Cloud. Kriste Basque, MD  Electronically Signed    SMN/MedQ  DD: 10/13/2006  DT: 10/13/2006  Job #: [id number]

## 2011-05-03 ENCOUNTER — Telehealth: Payer: Self-pay | Admitting: Pulmonary Disease

## 2011-05-03 ENCOUNTER — Ambulatory Visit: Payer: Medicare Other | Admitting: Internal Medicine

## 2011-05-03 DIAGNOSIS — G4733 Obstructive sleep apnea (adult) (pediatric): Secondary | ICD-10-CM

## 2011-05-03 NOTE — Telephone Encounter (Signed)
Auto-CPAP from 03/17/11 to 03/27/11>>Average use 4hrs .  95% percentile pressure 11 cmH2O with average AHI 6.  Will have my nurse call to schedule ROV to discuss status of sleep apnea.

## 2011-05-04 NOTE — Telephone Encounter (Signed)
Spoke with Johnny Mitchell and he did not want to schedule an appointment to come in to discuss status of sleep apnea. Johnny Mitchell states he would like for Dr. Craige Cotta to call him personally. Please advise Dr. Craige Cotta. Thanks  Carver Fila, CMA

## 2011-05-05 NOTE — Telephone Encounter (Signed)
Pt states he was having trouble with his machine, and didn't think he should come in.  I told him that based on medicare requirements he needs to be evaluated 30 to 90 days after initial CPAP set up.  He understands this, and is now agreeable to come in for evaluation.  Will have my nurse call him to schedule ROV in 2 to 3 weeks.

## 2011-05-06 NOTE — Telephone Encounter (Signed)
Pt is coming in 6/4 at 10:45 to follow up on cpap

## 2011-05-11 ENCOUNTER — Encounter: Payer: Self-pay | Admitting: Pulmonary Disease

## 2011-05-12 ENCOUNTER — Other Ambulatory Visit: Payer: Self-pay | Admitting: Oncology

## 2011-05-12 ENCOUNTER — Encounter (HOSPITAL_BASED_OUTPATIENT_CLINIC_OR_DEPARTMENT_OTHER): Payer: Medicare Other | Admitting: Oncology

## 2011-05-12 DIAGNOSIS — Z5112 Encounter for antineoplastic immunotherapy: Secondary | ICD-10-CM

## 2011-05-12 DIAGNOSIS — E8589 Other amyloidosis: Secondary | ICD-10-CM

## 2011-05-12 LAB — CBC WITH DIFFERENTIAL/PLATELET
BASO%: 0 % (ref 0.0–2.0)
Eosinophils Absolute: 0 10*3/uL (ref 0.0–0.5)
LYMPH%: 9.7 % — ABNORMAL LOW (ref 14.0–49.0)
MCHC: 33.6 g/dL (ref 32.0–36.0)
MCV: 96.9 fL (ref 79.3–98.0)
MONO#: 0.6 10*3/uL (ref 0.1–0.9)
MONO%: 8.6 % (ref 0.0–14.0)
NEUT#: 6 10*3/uL (ref 1.5–6.5)
RBC: 4.31 10*6/uL (ref 4.20–5.82)
RDW: 13.9 % (ref 11.0–14.6)
WBC: 7.4 10*3/uL (ref 4.0–10.3)

## 2011-05-14 LAB — COMPREHENSIVE METABOLIC PANEL
ALT: 14 U/L (ref 0–53)
AST: 16 U/L (ref 0–37)
Albumin: 3.7 g/dL (ref 3.5–5.2)
Alkaline Phosphatase: 77 U/L (ref 39–117)
Glucose, Bld: 96 mg/dL (ref 70–99)
Potassium: 4.6 mEq/L (ref 3.5–5.3)
Sodium: 142 mEq/L (ref 135–145)
Total Bilirubin: 0.7 mg/dL (ref 0.3–1.2)
Total Protein: 5.7 g/dL — ABNORMAL LOW (ref 6.0–8.3)

## 2011-05-14 LAB — PROTEIN ELECTROPHORESIS, SERUM
Beta 2: 4.7 % (ref 3.2–6.5)
Beta Globulin: 5.4 % (ref 4.7–7.2)
Gamma Globulin: 12 % (ref 11.1–18.8)

## 2011-05-14 LAB — KAPPA/LAMBDA LIGHT CHAINS: Kappa:Lambda Ratio: 2.27 — ABNORMAL HIGH (ref 0.26–1.65)

## 2011-05-17 ENCOUNTER — Ambulatory Visit (INDEPENDENT_AMBULATORY_CARE_PROVIDER_SITE_OTHER): Payer: Medicare Other | Admitting: Pulmonary Disease

## 2011-05-17 ENCOUNTER — Encounter: Payer: Self-pay | Admitting: Pulmonary Disease

## 2011-05-17 VITALS — BP 120/78 | HR 63 | Temp 97.6°F | Ht 71.0 in | Wt 265.0 lb

## 2011-05-17 DIAGNOSIS — G4733 Obstructive sleep apnea (adult) (pediatric): Secondary | ICD-10-CM

## 2011-05-17 NOTE — Patient Instructions (Signed)
Follow-up in one year.

## 2011-05-17 NOTE — Assessment & Plan Note (Signed)
He is doing well with CPAP.  Encouraged him to maintain his compliance.

## 2011-05-17 NOTE — Progress Notes (Signed)
Subjective:    Patient ID: Johnny Mitchell, male    DOB: [birth year], 70 y.o.   MRN: [id number]  HPI CC: Johnny Mitchell  70 yo male with OSA using CPAP.  He has been doing well with CPAP.  He is sleeping better, and has more energy during the day.  He has nasal mask.  He gets occasional mouth dryness.  He is using his machine about 6 hours per night.  Past Medical History  Diagnosis Date  . Hypertension   . Coronary atherosclerosis of unspecified type of vessel, native or graft   . Cerebrovascular disease, unspecified   . Pure hypercholesterolemia   . Diverticulosis of colon (without mention of hemorrhage)   . Benign neoplasm of colon   . Unspecified hemorrhoids without mention of complication   . Degenerative disc disease   . 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 Low back pain   . Hemangioma   . Amyloidosis   . LBBB (left bundle branch block)   . 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 OSA (obstructive sleep apnea)      No Known Allergies   Outpatient Prescriptions Prior to Visit  Medication Sig Dispense Refill  . acyclovir (ZOVIRAX) 800 MG tablet 400 mg. 1/2 tab po qd      . atenolol (TENORMIN) 100 MG tablet Take 100 mg by mouth daily.        Marland Kitchen oxyCODONE (OXYCONTIN) 10 MG 12 hr tablet Take as needed       . spironolactone (ALDACTONE) 25 MG tablet Take 25 mg by mouth daily.        Marland Kitchen aspirin EC 81 MG EC tablet Take 1 tablet (81 mg total) by mouth daily.  150 tablet  2  . pravastatin (PRAVACHOL) 40 MG tablet Take 1 tablet (40 mg total) by mouth every evening.  30 tablet  11   Review of Systems     Objective:   Physical Exam  BP 120/78  Pulse 63  Temp(Src) 97.6 F (36.4 C) (Oral)  Ht 5\' 11"  (1.803 m)  Wt 265 lb (120.203 kg)  BMI 36.96 kg/m2  SpO2 95%   Constitutional: He is oriented to person, place, and time. He appears well-developed and well-nourished. No distress.  HENT:  Nose: Nose normal.    Mouth/Throat: Oropharynx is clear and moist. No oropharyngeal exudate.  MP 4  Eyes: EOM are normal. Pupils are equal, round, and reactive to light. Left eye exhibits no discharge. No scleral icterus.  Neck: Neck supple. No JVD present. No tracheal deviation present. No thyromegaly present.  Cardiovascular: Normal rate, regular rhythm, normal heart sounds and intact distal pulses.  No murmur heard.  Pulmonary/Chest: Effort normal and breath sounds normal.  Abdominal: Soft. Bowel sounds are normal.  Musculoskeletal: He exhibits no edema.  Lymphadenopathy:  He has no cervical adenopathy.  Neurological: He is alert and oriented to person, place, and time. No cranial nerve deficit.  Skin: Skin is warm and dry.  Psychiatric: He has a normal mood and affect.       Assessment & Plan:   OBSTRUCTIVE SLEEP APNEA He is doing well with CPAP.  Encouraged him to maintain his compliance.      Updated Medication List Outpatient Encounter Prescriptions as of 05/17/2011  Medication Sig Dispense Refill  . acyclovir (ZOVIRAX) 800 MG tablet 400 mg. 1/2 tab po qd      . atenolol (TENORMIN) 100 MG  tablet Take 100 mg by mouth daily.        Marland Kitchen oxyCODONE (OXYCONTIN) 10 MG 12 hr tablet Take as needed       . spironolactone (ALDACTONE) 25 MG tablet Take 25 mg by mouth daily.        Marland Kitchen DISCONTD: aspirin EC 81 MG EC tablet Take 1 tablet (81 mg total) by mouth daily.  150 tablet  2  . DISCONTD: pravastatin (PRAVACHOL) 40 MG tablet Take 1 tablet (40 mg total) by mouth every evening.  30 tablet  11

## 2011-05-19 ENCOUNTER — Other Ambulatory Visit: Payer: Self-pay | Admitting: Oncology

## 2011-05-19 ENCOUNTER — Encounter (HOSPITAL_BASED_OUTPATIENT_CLINIC_OR_DEPARTMENT_OTHER): Payer: Medicare Other | Admitting: Oncology

## 2011-05-19 DIAGNOSIS — E859 Amyloidosis, unspecified: Secondary | ICD-10-CM

## 2011-05-19 DIAGNOSIS — Z5112 Encounter for antineoplastic immunotherapy: Secondary | ICD-10-CM

## 2011-05-19 DIAGNOSIS — E8589 Other amyloidosis: Secondary | ICD-10-CM

## 2011-05-19 DIAGNOSIS — Z5111 Encounter for antineoplastic chemotherapy: Secondary | ICD-10-CM

## 2011-05-19 LAB — CBC WITH DIFFERENTIAL/PLATELET
BASO%: 0.1 % (ref 0.0–2.0)
Basophils Absolute: 0 10*3/uL (ref 0.0–0.1)
EOS%: 0.6 % (ref 0.0–7.0)
HCT: 44.2 % (ref 38.4–49.9)
HGB: 14.9 g/dL (ref 13.0–17.1)
LYMPH%: 9.4 % — ABNORMAL LOW (ref 14.0–49.0)
MCH: 32.2 pg (ref 27.2–33.4)
MCHC: 33.6 g/dL (ref 32.0–36.0)
NEUT%: 80 % — ABNORMAL HIGH (ref 39.0–75.0)
Platelets: 159 10*3/uL (ref 140–400)
lymph#: 0.9 10*3/uL (ref 0.9–3.3)

## 2011-05-19 LAB — BASIC METABOLIC PANEL
Glucose, Bld: 92 mg/dL (ref 70–99)
Potassium: 4.8 mEq/L (ref 3.5–5.3)
Sodium: 141 mEq/L (ref 135–145)

## 2011-05-26 ENCOUNTER — Other Ambulatory Visit: Payer: Self-pay | Admitting: Oncology

## 2011-05-26 ENCOUNTER — Encounter (HOSPITAL_BASED_OUTPATIENT_CLINIC_OR_DEPARTMENT_OTHER): Payer: Medicare Other | Admitting: Oncology

## 2011-05-26 DIAGNOSIS — Z5112 Encounter for antineoplastic immunotherapy: Secondary | ICD-10-CM

## 2011-05-26 DIAGNOSIS — Z5111 Encounter for antineoplastic chemotherapy: Secondary | ICD-10-CM

## 2011-05-26 DIAGNOSIS — E8589 Other amyloidosis: Secondary | ICD-10-CM

## 2011-05-26 LAB — BASIC METABOLIC PANEL
BUN: 21 mg/dL (ref 6–23)
CO2: 26 mEq/L (ref 19–32)
Calcium: 8.6 mg/dL (ref 8.4–10.5)
Creatinine, Ser: 0.9 mg/dL (ref 0.50–1.35)
Glucose, Bld: 85 mg/dL (ref 70–99)

## 2011-05-26 LAB — CBC WITH DIFFERENTIAL/PLATELET
BASO%: 0 % (ref 0.0–2.0)
Basophils Absolute: 0 10*3/uL (ref 0.0–0.1)
Eosinophils Absolute: 0 10*3/uL (ref 0.0–0.5)
HCT: 42.7 % (ref 38.4–49.9)
HGB: 14.3 g/dL (ref 13.0–17.1)
LYMPH%: 10.1 % — ABNORMAL LOW (ref 14.0–49.0)
MCHC: 33.4 g/dL (ref 32.0–36.0)
MONO#: 0.9 10*3/uL (ref 0.1–0.9)
NEUT%: 79 % — ABNORMAL HIGH (ref 39.0–75.0)
Platelets: 154 10*3/uL (ref 140–400)
WBC: 8.2 10*3/uL (ref 4.0–10.3)

## 2011-05-31 ENCOUNTER — Encounter: Payer: Self-pay | Admitting: Pulmonary Disease

## 2011-06-01 ENCOUNTER — Other Ambulatory Visit: Payer: Self-pay | Admitting: Neurological Surgery

## 2011-06-01 DIAGNOSIS — M545 Low back pain: Secondary | ICD-10-CM

## 2011-06-05 ENCOUNTER — Other Ambulatory Visit: Payer: Medicare Other

## 2011-06-08 ENCOUNTER — Ambulatory Visit
Admission: RE | Admit: 2011-06-08 | Discharge: 2011-06-08 | Disposition: A | Discharge status: Home / Self Care | Payer: Medicare Other | Source: Ambulatory Visit | Attending: Neurological Surgery | Admitting: Neurological Surgery

## 2011-06-08 DIAGNOSIS — M545 Low back pain: Secondary | ICD-10-CM

## 2011-06-09 ENCOUNTER — Other Ambulatory Visit: Payer: Self-pay | Admitting: Oncology

## 2011-06-09 ENCOUNTER — Encounter (HOSPITAL_BASED_OUTPATIENT_CLINIC_OR_DEPARTMENT_OTHER): Payer: Medicare Other | Admitting: Oncology

## 2011-06-09 DIAGNOSIS — E8589 Other amyloidosis: Secondary | ICD-10-CM

## 2011-06-09 DIAGNOSIS — Z5112 Encounter for antineoplastic immunotherapy: Secondary | ICD-10-CM

## 2011-06-09 DIAGNOSIS — Z5111 Encounter for antineoplastic chemotherapy: Secondary | ICD-10-CM

## 2011-06-09 LAB — CBC WITH DIFFERENTIAL/PLATELET
BASO%: 0 % (ref 0.0–2.0)
EOS%: 0.2 % (ref 0.0–7.0)
MCH: 32.3 pg (ref 27.2–33.4)
MCHC: 33.7 g/dL (ref 32.0–36.0)
MONO#: 0.7 10*3/uL (ref 0.1–0.9)
RBC: 4.34 10*6/uL (ref 4.20–5.82)
RDW: 13.5 % (ref 11.0–14.6)
WBC: 8.6 10*3/uL (ref 4.0–10.3)
lymph#: 0.8 10*3/uL — ABNORMAL LOW (ref 0.9–3.3)

## 2011-06-10 ENCOUNTER — Other Ambulatory Visit: Payer: Self-pay | Admitting: *Deleted

## 2011-06-10 MED ORDER — ATENOLOL 100 MG PO TABS: 100.0000 mg | ORAL_TABLET | Freq: Every day | ORAL | Status: DC

## 2011-06-14 LAB — PROTEIN ELECTROPHORESIS, SERUM
Alpha-1-Globulin: 4.3 % (ref 2.9–4.9)
Alpha-2-Globulin: 13.9 % — ABNORMAL HIGH (ref 7.1–11.8)
Beta 2: 4.9 % (ref 3.2–6.5)
Beta Globulin: 5.5 % (ref 4.7–7.2)
Gamma Globulin: 12.6 % (ref 11.1–18.8)

## 2011-06-14 LAB — COMPREHENSIVE METABOLIC PANEL
ALT: 17 U/L (ref 0–53)
AST: 17 U/L (ref 0–37)
Alkaline Phosphatase: 77 U/L (ref 39–117)
BUN: 24 mg/dL — ABNORMAL HIGH (ref 6–23)
Calcium: 8.8 mg/dL (ref 8.4–10.5)
Chloride: 106 mEq/L (ref 96–112)
Creatinine, Ser: 1.09 mg/dL (ref 0.50–1.35)
Total Bilirubin: 0.6 mg/dL (ref 0.3–1.2)

## 2011-06-14 LAB — KAPPA/LAMBDA LIGHT CHAINS: Kappa free light chain: 3.72 mg/dL — ABNORMAL HIGH (ref 0.33–1.94)

## 2011-06-17 ENCOUNTER — Other Ambulatory Visit: Payer: Self-pay | Admitting: Oncology

## 2011-06-17 ENCOUNTER — Encounter (HOSPITAL_BASED_OUTPATIENT_CLINIC_OR_DEPARTMENT_OTHER): Payer: Medicare Other | Admitting: Oncology

## 2011-06-17 DIAGNOSIS — Z5111 Encounter for antineoplastic chemotherapy: Secondary | ICD-10-CM

## 2011-06-17 DIAGNOSIS — Z5112 Encounter for antineoplastic immunotherapy: Secondary | ICD-10-CM

## 2011-06-17 DIAGNOSIS — E8589 Other amyloidosis: Secondary | ICD-10-CM

## 2011-06-17 DIAGNOSIS — R809 Proteinuria, unspecified: Secondary | ICD-10-CM

## 2011-06-17 LAB — CBC WITH DIFFERENTIAL/PLATELET
Basophils Absolute: 0 10*3/uL (ref 0.0–0.1)
Eosinophils Absolute: 0 10*3/uL (ref 0.0–0.5)
HCT: 42.9 % (ref 38.4–49.9)
HGB: 14.1 g/dL (ref 13.0–17.1)
LYMPH%: 10.3 % — ABNORMAL LOW (ref 14.0–49.0)
MCV: 95.3 fL (ref 79.3–98.0)
MONO#: 1.1 10*3/uL — ABNORMAL HIGH (ref 0.1–0.9)
MONO%: 13.4 % (ref 0.0–14.0)
NEUT#: 6.1 10*3/uL (ref 1.5–6.5)
Platelets: 168 10*3/uL (ref 140–400)
RBC: 4.5 10*6/uL (ref 4.20–5.82)
WBC: 8.1 10*3/uL (ref 4.0–10.3)

## 2011-06-24 ENCOUNTER — Encounter (HOSPITAL_BASED_OUTPATIENT_CLINIC_OR_DEPARTMENT_OTHER): Payer: Medicare Other | Admitting: Oncology

## 2011-06-24 ENCOUNTER — Other Ambulatory Visit (HOSPITAL_COMMUNITY): Payer: Self-pay | Admitting: Oncology

## 2011-06-24 DIAGNOSIS — Z5112 Encounter for antineoplastic immunotherapy: Secondary | ICD-10-CM

## 2011-06-24 DIAGNOSIS — Z5111 Encounter for antineoplastic chemotherapy: Secondary | ICD-10-CM

## 2011-06-24 DIAGNOSIS — E8589 Other amyloidosis: Secondary | ICD-10-CM

## 2011-06-24 LAB — CBC WITH DIFFERENTIAL/PLATELET
Eosinophils Absolute: 0.1 10*3/uL (ref 0.0–0.5)
HCT: 44.4 % (ref 38.4–49.9)
LYMPH%: 13.6 % — ABNORMAL LOW (ref 14.0–49.0)
MCHC: 33.6 g/dL (ref 32.0–36.0)
MCV: 94.7 fL (ref 79.3–98.0)
MONO#: 0.9 10*3/uL (ref 0.1–0.9)
MONO%: 9.9 % (ref 0.0–14.0)
NEUT#: 6.7 10*3/uL — ABNORMAL HIGH (ref 1.5–6.5)
NEUT%: 75.5 % — ABNORMAL HIGH (ref 39.0–75.0)
Platelets: 176 10*3/uL (ref 140–400)
WBC: 8.8 10*3/uL (ref 4.0–10.3)

## 2011-07-08 ENCOUNTER — Other Ambulatory Visit: Payer: Self-pay | Admitting: Oncology

## 2011-07-08 ENCOUNTER — Encounter (HOSPITAL_BASED_OUTPATIENT_CLINIC_OR_DEPARTMENT_OTHER): Payer: Medicare Other | Admitting: Oncology

## 2011-07-08 DIAGNOSIS — Z5111 Encounter for antineoplastic chemotherapy: Secondary | ICD-10-CM

## 2011-07-08 DIAGNOSIS — Z5112 Encounter for antineoplastic immunotherapy: Secondary | ICD-10-CM

## 2011-07-08 DIAGNOSIS — E8589 Other amyloidosis: Secondary | ICD-10-CM

## 2011-07-08 LAB — CBC WITH DIFFERENTIAL/PLATELET
BASO%: 0.3 % (ref 0.0–2.0)
EOS%: 0.4 % (ref 0.0–7.0)
Eosinophils Absolute: 0 10*3/uL (ref 0.0–0.5)
LYMPH%: 10.5 % — ABNORMAL LOW (ref 14.0–49.0)
MCH: 31 pg (ref 27.2–33.4)
MCHC: 32.9 g/dL (ref 32.0–36.0)
MCV: 94.1 fL (ref 79.3–98.0)
MONO%: 10.8 % (ref 0.0–14.0)
Platelets: 178 10*3/uL (ref 140–400)
RBC: 4.55 10*6/uL (ref 4.20–5.82)
RDW: 12.8 % (ref 11.0–14.6)

## 2011-07-12 LAB — PROTEIN ELECTROPHORESIS, SERUM
Albumin ELP: 59.9 % (ref 55.8–66.1)
Beta 2: 4.5 % (ref 3.2–6.5)
Gamma Globulin: 13.1 % (ref 11.1–18.8)

## 2011-07-12 LAB — COMPREHENSIVE METABOLIC PANEL
AST: 21 U/L (ref 0–37)
Alkaline Phosphatase: 84 U/L (ref 39–117)
Glucose, Bld: 124 mg/dL — ABNORMAL HIGH (ref 70–99)
Potassium: 4.3 mEq/L (ref 3.5–5.3)
Sodium: 142 mEq/L (ref 135–145)
Total Bilirubin: 0.4 mg/dL (ref 0.3–1.2)
Total Protein: 5.6 g/dL — ABNORMAL LOW (ref 6.0–8.3)

## 2011-07-15 ENCOUNTER — Encounter (HOSPITAL_BASED_OUTPATIENT_CLINIC_OR_DEPARTMENT_OTHER): Payer: Medicare Other | Admitting: Oncology

## 2011-07-15 ENCOUNTER — Encounter: Payer: Self-pay | Admitting: Pulmonary Disease

## 2011-07-15 ENCOUNTER — Other Ambulatory Visit (HOSPITAL_COMMUNITY): Payer: Self-pay | Admitting: Oncology

## 2011-07-15 DIAGNOSIS — Z5111 Encounter for antineoplastic chemotherapy: Secondary | ICD-10-CM

## 2011-07-15 DIAGNOSIS — E8589 Other amyloidosis: Secondary | ICD-10-CM

## 2011-07-15 DIAGNOSIS — Z5112 Encounter for antineoplastic immunotherapy: Secondary | ICD-10-CM

## 2011-07-15 LAB — CBC WITH DIFFERENTIAL/PLATELET
BASO%: 0.3 % (ref 0.0–2.0)
EOS%: 0.5 % (ref 0.0–7.0)
HCT: 43.6 % (ref 38.4–49.9)
LYMPH%: 14.5 % (ref 14.0–49.0)
MCH: 31.7 pg (ref 27.2–33.4)
MCHC: 33.5 g/dL (ref 32.0–36.0)
MCV: 94.8 fL (ref 79.3–98.0)
NEUT%: 75.4 % — ABNORMAL HIGH (ref 39.0–75.0)
Platelets: 147 10*3/uL (ref 140–400)

## 2011-07-19 ENCOUNTER — Other Ambulatory Visit: Payer: Self-pay | Admitting: Oncology

## 2011-07-21 LAB — UIFE/LIGHT CHAINS/TP QN, 24-HR UR
Alpha 1, Urine: DETECTED — AB
Alpha 2, Urine: DETECTED — AB
Free Kappa Lt Chains,Ur: 8.04 mg/dL — ABNORMAL HIGH (ref 0.14–2.42)
Free Kappa/Lambda Ratio: 114.86 ratio — ABNORMAL HIGH (ref 2.04–10.37)
Gamma Globulin, Urine: DETECTED — AB

## 2011-07-22 ENCOUNTER — Encounter (HOSPITAL_BASED_OUTPATIENT_CLINIC_OR_DEPARTMENT_OTHER): Payer: Medicare Other | Admitting: Oncology

## 2011-07-22 ENCOUNTER — Other Ambulatory Visit: Payer: Self-pay | Admitting: Oncology

## 2011-07-22 DIAGNOSIS — Z5111 Encounter for antineoplastic chemotherapy: Secondary | ICD-10-CM

## 2011-07-22 DIAGNOSIS — E8589 Other amyloidosis: Secondary | ICD-10-CM

## 2011-07-22 DIAGNOSIS — Z5112 Encounter for antineoplastic immunotherapy: Secondary | ICD-10-CM

## 2011-07-22 LAB — CBC WITH DIFFERENTIAL/PLATELET
Basophils Absolute: 0 10*3/uL (ref 0.0–0.1)
Eosinophils Absolute: 0 10*3/uL (ref 0.0–0.5)
HGB: 14.3 g/dL (ref 13.0–17.1)
MCV: 97.3 fL (ref 79.3–98.0)
MONO#: 0.9 10*3/uL (ref 0.1–0.9)
MONO%: 10.8 % (ref 0.0–14.0)
NEUT#: 6.2 10*3/uL (ref 1.5–6.5)
Platelets: 165 10*3/uL (ref 140–400)
RDW: 13.2 % (ref 11.0–14.6)
WBC: 8.3 10*3/uL (ref 4.0–10.3)

## 2011-08-05 ENCOUNTER — Other Ambulatory Visit: Payer: Self-pay | Admitting: Oncology

## 2011-08-05 ENCOUNTER — Encounter (HOSPITAL_BASED_OUTPATIENT_CLINIC_OR_DEPARTMENT_OTHER): Payer: Medicare Other | Admitting: Oncology

## 2011-08-05 DIAGNOSIS — Z5111 Encounter for antineoplastic chemotherapy: Secondary | ICD-10-CM

## 2011-08-05 DIAGNOSIS — D72829 Elevated white blood cell count, unspecified: Secondary | ICD-10-CM

## 2011-08-05 DIAGNOSIS — R609 Edema, unspecified: Secondary | ICD-10-CM

## 2011-08-05 DIAGNOSIS — R0989 Other specified symptoms and signs involving the circulatory and respiratory systems: Secondary | ICD-10-CM

## 2011-08-05 DIAGNOSIS — E8589 Other amyloidosis: Secondary | ICD-10-CM

## 2011-08-05 DIAGNOSIS — Z5112 Encounter for antineoplastic immunotherapy: Secondary | ICD-10-CM

## 2011-08-05 LAB — CBC WITH DIFFERENTIAL/PLATELET
Basophils Absolute: 0 10*3/uL (ref 0.0–0.1)
Eosinophils Absolute: 0 10*3/uL (ref 0.0–0.5)
HCT: 39.9 % (ref 38.4–49.9)
HGB: 13.6 g/dL (ref 13.0–17.1)
LYMPH%: 9.1 % — ABNORMAL LOW (ref 14.0–49.0)
MCHC: 34 g/dL (ref 32.0–36.0)
MONO#: 0.7 10*3/uL (ref 0.1–0.9)
NEUT%: 81.1 % — ABNORMAL HIGH (ref 39.0–75.0)
Platelets: 173 10*3/uL (ref 140–400)
WBC: 7.7 10*3/uL (ref 4.0–10.3)
lymph#: 0.7 10*3/uL — ABNORMAL LOW (ref 0.9–3.3)

## 2011-08-05 LAB — COMPREHENSIVE METABOLIC PANEL
ALT: 19 U/L (ref 0–53)
AST: 19 U/L (ref 0–37)
Creatinine, Ser: 0.85 mg/dL (ref 0.50–1.35)
Total Bilirubin: 0.6 mg/dL (ref 0.3–1.2)

## 2011-08-05 LAB — BRAIN NATRIURETIC PEPTIDE: Brain Natriuretic Peptide: 169.4 pg/mL — ABNORMAL HIGH (ref 0.0–100.0)

## 2011-08-05 LAB — MAGNESIUM: Magnesium: 1.8 mg/dL (ref 1.5–2.5)

## 2011-08-06 LAB — KAPPA/LAMBDA LIGHT CHAINS: Kappa:Lambda Ratio: 4.04 — ABNORMAL HIGH (ref 0.26–1.65)

## 2011-08-12 ENCOUNTER — Other Ambulatory Visit: Payer: Self-pay | Admitting: Oncology

## 2011-08-12 ENCOUNTER — Ambulatory Visit (HOSPITAL_COMMUNITY)
Admission: RE | Admit: 2011-08-12 | Discharge: 2011-08-12 | Disposition: A | Discharge status: Home / Self Care | Payer: Medicare Other | Source: Ambulatory Visit | Attending: Oncology | Admitting: Oncology

## 2011-08-12 ENCOUNTER — Encounter (HOSPITAL_BASED_OUTPATIENT_CLINIC_OR_DEPARTMENT_OTHER): Payer: Medicare Other | Admitting: Oncology

## 2011-08-12 DIAGNOSIS — R0602 Shortness of breath: Secondary | ICD-10-CM | POA: Insufficient documentation

## 2011-08-12 DIAGNOSIS — R0989 Other specified symptoms and signs involving the circulatory and respiratory systems: Secondary | ICD-10-CM | POA: Insufficient documentation

## 2011-08-12 DIAGNOSIS — C801 Malignant (primary) neoplasm, unspecified: Secondary | ICD-10-CM

## 2011-08-12 DIAGNOSIS — E8589 Other amyloidosis: Secondary | ICD-10-CM

## 2011-08-12 DIAGNOSIS — Z5112 Encounter for antineoplastic immunotherapy: Secondary | ICD-10-CM

## 2011-08-12 DIAGNOSIS — Z5111 Encounter for antineoplastic chemotherapy: Secondary | ICD-10-CM

## 2011-08-12 LAB — CBC WITH DIFFERENTIAL/PLATELET
Basophils Absolute: 0 10*3/uL (ref 0.0–0.1)
Eosinophils Absolute: 0 10*3/uL (ref 0.0–0.5)
HGB: 14.3 g/dL (ref 13.0–17.1)
LYMPH%: 10.7 % — ABNORMAL LOW (ref 14.0–49.0)
MCV: 95.1 fL (ref 79.3–98.0)
MONO#: 0.8 10*3/uL (ref 0.1–0.9)
MONO%: 10 % (ref 0.0–14.0)
NEUT#: 6.2 10*3/uL (ref 1.5–6.5)
Platelets: 175 10*3/uL (ref 140–400)
RBC: 4.41 10*6/uL (ref 4.20–5.82)
RDW: 13.9 % (ref 11.0–14.6)
WBC: 7.8 10*3/uL (ref 4.0–10.3)

## 2011-08-12 LAB — BASIC METABOLIC PANEL
BUN: 18 mg/dL (ref 6–23)
Calcium: 9.4 mg/dL (ref 8.4–10.5)
Creatinine, Ser: 0.84 mg/dL (ref 0.50–1.35)

## 2011-08-19 ENCOUNTER — Encounter (HOSPITAL_BASED_OUTPATIENT_CLINIC_OR_DEPARTMENT_OTHER): Payer: Medicare Other | Admitting: Oncology

## 2011-08-19 ENCOUNTER — Other Ambulatory Visit: Payer: Self-pay | Admitting: Oncology

## 2011-08-19 DIAGNOSIS — E8589 Other amyloidosis: Secondary | ICD-10-CM

## 2011-08-19 DIAGNOSIS — Z5112 Encounter for antineoplastic immunotherapy: Secondary | ICD-10-CM

## 2011-08-19 DIAGNOSIS — Z5111 Encounter for antineoplastic chemotherapy: Secondary | ICD-10-CM

## 2011-08-19 LAB — BASIC METABOLIC PANEL
CO2: 31 mEq/L (ref 19–32)
Chloride: 101 mEq/L (ref 96–112)
Glucose, Bld: 95 mg/dL (ref 70–99)
Potassium: 4.3 mEq/L (ref 3.5–5.3)
Sodium: 141 mEq/L (ref 135–145)

## 2011-08-19 LAB — CBC WITH DIFFERENTIAL/PLATELET
Eosinophils Absolute: 0.1 10*3/uL (ref 0.0–0.5)
MONO#: 1.1 10*3/uL — ABNORMAL HIGH (ref 0.1–0.9)
MONO%: 12.5 % (ref 0.0–14.0)
NEUT#: 6.3 10*3/uL (ref 1.5–6.5)
RBC: 4.61 10*6/uL (ref 4.20–5.82)
RDW: 13.7 % (ref 11.0–14.6)
WBC: 8.6 10*3/uL (ref 4.0–10.3)
lymph#: 1.2 10*3/uL (ref 0.9–3.3)

## 2011-09-02 ENCOUNTER — Encounter (HOSPITAL_BASED_OUTPATIENT_CLINIC_OR_DEPARTMENT_OTHER): Payer: Medicare Other | Admitting: Oncology

## 2011-09-02 ENCOUNTER — Other Ambulatory Visit (HOSPITAL_COMMUNITY): Payer: Self-pay | Admitting: Oncology

## 2011-09-02 DIAGNOSIS — E859 Amyloidosis, unspecified: Secondary | ICD-10-CM

## 2011-09-02 DIAGNOSIS — Z5112 Encounter for antineoplastic immunotherapy: Secondary | ICD-10-CM

## 2011-09-02 DIAGNOSIS — E8589 Other amyloidosis: Secondary | ICD-10-CM

## 2011-09-02 DIAGNOSIS — Z5111 Encounter for antineoplastic chemotherapy: Secondary | ICD-10-CM

## 2011-09-02 LAB — CBC WITH DIFFERENTIAL/PLATELET
BASO%: 0.2 % (ref 0.0–2.0)
HCT: 44.5 % (ref 38.4–49.9)
LYMPH%: 10.3 % — ABNORMAL LOW (ref 14.0–49.0)
MCHC: 33.5 g/dL (ref 32.0–36.0)
MONO#: 1 10*3/uL — ABNORMAL HIGH (ref 0.1–0.9)
NEUT%: 80.8 % — ABNORMAL HIGH (ref 39.0–75.0)
Platelets: 173 10*3/uL (ref 140–400)
WBC: 12.2 10*3/uL — ABNORMAL HIGH (ref 4.0–10.3)

## 2011-09-02 LAB — TECHNOLOGIST REVIEW

## 2011-09-03 LAB — COMPREHENSIVE METABOLIC PANEL
ALT: 13 U/L (ref 0–53)
AST: 18 U/L (ref 0–37)
Calcium: 9.1 mg/dL (ref 8.4–10.5)
Chloride: 103 mEq/L (ref 96–112)
Creatinine, Ser: 1.05 mg/dL (ref 0.50–1.35)
Potassium: 4.3 mEq/L (ref 3.5–5.3)

## 2011-09-09 ENCOUNTER — Encounter (HOSPITAL_BASED_OUTPATIENT_CLINIC_OR_DEPARTMENT_OTHER): Payer: Medicare Other | Admitting: Oncology

## 2011-09-09 ENCOUNTER — Other Ambulatory Visit: Payer: Self-pay | Admitting: Oncology

## 2011-09-09 DIAGNOSIS — Z5112 Encounter for antineoplastic immunotherapy: Secondary | ICD-10-CM

## 2011-09-09 DIAGNOSIS — E8589 Other amyloidosis: Secondary | ICD-10-CM

## 2011-09-09 DIAGNOSIS — Z5111 Encounter for antineoplastic chemotherapy: Secondary | ICD-10-CM

## 2011-09-09 LAB — BASIC METABOLIC PANEL
BUN: 35 mg/dL — ABNORMAL HIGH (ref 6–23)
CO2: 28 mEq/L (ref 19–32)
Calcium: 9.3 mg/dL (ref 8.4–10.5)
Creatinine, Ser: 1.16 mg/dL (ref 0.50–1.35)
Glucose, Bld: 89 mg/dL (ref 70–99)

## 2011-09-09 LAB — CBC WITH DIFFERENTIAL/PLATELET
Basophils Absolute: 0 10*3/uL (ref 0.0–0.1)
Eosinophils Absolute: 0 10*3/uL (ref 0.0–0.5)
HCT: 43.3 % (ref 38.4–49.9)
LYMPH%: 12.2 % — ABNORMAL LOW (ref 14.0–49.0)
MCHC: 34.6 g/dL (ref 32.0–36.0)
MONO#: 1 10*3/uL — ABNORMAL HIGH (ref 0.1–0.9)
NEUT#: 7.4 10*3/uL — ABNORMAL HIGH (ref 1.5–6.5)
NEUT%: 77.2 % — ABNORMAL HIGH (ref 39.0–75.0)
Platelets: 169 10*3/uL (ref 140–400)
WBC: 9.6 10*3/uL (ref 4.0–10.3)

## 2011-09-16 ENCOUNTER — Other Ambulatory Visit: Payer: Self-pay | Admitting: Oncology

## 2011-09-16 ENCOUNTER — Encounter (HOSPITAL_BASED_OUTPATIENT_CLINIC_OR_DEPARTMENT_OTHER): Payer: Medicare Other | Admitting: Oncology

## 2011-09-16 DIAGNOSIS — Z5112 Encounter for antineoplastic immunotherapy: Secondary | ICD-10-CM

## 2011-09-16 DIAGNOSIS — R809 Proteinuria, unspecified: Secondary | ICD-10-CM

## 2011-09-16 DIAGNOSIS — E8589 Other amyloidosis: Secondary | ICD-10-CM

## 2011-09-16 DIAGNOSIS — Z5111 Encounter for antineoplastic chemotherapy: Secondary | ICD-10-CM

## 2011-09-16 LAB — CBC WITH DIFFERENTIAL/PLATELET
BASO%: 0.1 % (ref 0.0–2.0)
Eosinophils Absolute: 0 10*3/uL (ref 0.0–0.5)
HCT: 42.2 % (ref 38.4–49.9)
HGB: 14.3 g/dL (ref 13.0–17.1)
MCHC: 33.9 g/dL (ref 32.0–36.0)
MONO#: 0.9 10*3/uL (ref 0.1–0.9)
NEUT#: 6.4 10*3/uL (ref 1.5–6.5)
NEUT%: 75.1 % — ABNORMAL HIGH (ref 39.0–75.0)
WBC: 8.5 10*3/uL (ref 4.0–10.3)
lymph#: 1.2 10*3/uL (ref 0.9–3.3)

## 2011-09-16 LAB — BASIC METABOLIC PANEL
CO2: 29 mEq/L (ref 19–32)
Chloride: 102 mEq/L (ref 96–112)
Creatinine, Ser: 1.27 mg/dL (ref 0.50–1.35)
Glucose, Bld: 82 mg/dL (ref 70–99)

## 2011-09-21 ENCOUNTER — Emergency Department (HOSPITAL_COMMUNITY)
Admission: EM | Admit: 2011-09-21 | Discharge: 2011-09-22 | Disposition: A | Discharge status: Home / Self Care | Payer: Medicare Other | Attending: Emergency Medicine | Admitting: Emergency Medicine

## 2011-09-21 DIAGNOSIS — IMO0002 Reserved for concepts with insufficient information to code with codable children: Secondary | ICD-10-CM | POA: Insufficient documentation

## 2011-09-21 DIAGNOSIS — Z79899 Other long term (current) drug therapy: Secondary | ICD-10-CM | POA: Insufficient documentation

## 2011-09-21 DIAGNOSIS — I10 Essential (primary) hypertension: Secondary | ICD-10-CM | POA: Insufficient documentation

## 2011-09-21 DIAGNOSIS — D649 Anemia, unspecified: Secondary | ICD-10-CM | POA: Insufficient documentation

## 2011-09-21 DIAGNOSIS — Y849 Medical procedure, unspecified as the cause of abnormal reaction of the patient, or of later complication, without mention of misadventure at the time of the procedure: Secondary | ICD-10-CM | POA: Insufficient documentation

## 2011-09-22 LAB — COMPREHENSIVE METABOLIC PANEL
Albumin: 2.5 g/dL — ABNORMAL LOW (ref 3.5–5.2)
BUN: 31 mg/dL — ABNORMAL HIGH (ref 6–23)
Calcium: 9 mg/dL (ref 8.4–10.5)
Creatinine, Ser: 1.01 mg/dL (ref 0.50–1.35)
Potassium: 4.4 mEq/L (ref 3.5–5.1)
Total Protein: 6.2 g/dL (ref 6.0–8.3)

## 2011-09-22 LAB — CBC
HCT: 31.6 % — ABNORMAL LOW (ref 39.0–52.0)
MCHC: 32.9 g/dL (ref 30.0–36.0)
MCV: 97.8 fL (ref 78.0–100.0)
RDW: 13.2 % (ref 11.5–15.5)

## 2011-09-22 LAB — DIFFERENTIAL
Basophils Absolute: 0 10*3/uL (ref 0.0–0.1)
Eosinophils Relative: 1 % (ref 0–5)
Lymphocytes Relative: 6 % — ABNORMAL LOW (ref 12–46)
Lymphs Abs: 0.8 10*3/uL (ref 0.7–4.0)
Monocytes Absolute: 1.6 10*3/uL — ABNORMAL HIGH (ref 0.1–1.0)

## 2011-09-23 ENCOUNTER — Other Ambulatory Visit: Payer: Self-pay | Admitting: Oncology

## 2011-09-23 ENCOUNTER — Encounter (HOSPITAL_BASED_OUTPATIENT_CLINIC_OR_DEPARTMENT_OTHER): Payer: Medicare Other | Admitting: Oncology

## 2011-09-23 DIAGNOSIS — E8589 Other amyloidosis: Secondary | ICD-10-CM

## 2011-09-23 DIAGNOSIS — E859 Amyloidosis, unspecified: Secondary | ICD-10-CM

## 2011-09-23 LAB — CBC WITH DIFFERENTIAL/PLATELET
Eosinophils Absolute: 0 10*3/uL (ref 0.0–0.5)
HCT: 32.6 % — ABNORMAL LOW (ref 38.4–49.9)
HGB: 10.8 g/dL — ABNORMAL LOW (ref 13.0–17.1)
LYMPH%: 7.3 % — ABNORMAL LOW (ref 14.0–49.0)
MONO#: 1.1 10*3/uL — ABNORMAL HIGH (ref 0.1–0.9)
NEUT#: 9.6 10*3/uL — ABNORMAL HIGH (ref 1.5–6.5)
NEUT%: 82.9 % — ABNORMAL HIGH (ref 39.0–75.0)
Platelets: 215 10*3/uL (ref 140–400)
WBC: 11.6 10*3/uL — ABNORMAL HIGH (ref 4.0–10.3)

## 2011-09-27 ENCOUNTER — Encounter (HOSPITAL_BASED_OUTPATIENT_CLINIC_OR_DEPARTMENT_OTHER): Payer: Medicare Other | Admitting: Oncology

## 2011-09-27 ENCOUNTER — Other Ambulatory Visit: Payer: Self-pay | Admitting: Oncology

## 2011-09-27 DIAGNOSIS — R609 Edema, unspecified: Secondary | ICD-10-CM

## 2011-09-27 DIAGNOSIS — Z5111 Encounter for antineoplastic chemotherapy: Secondary | ICD-10-CM

## 2011-09-27 DIAGNOSIS — E8589 Other amyloidosis: Secondary | ICD-10-CM

## 2011-09-27 DIAGNOSIS — Z5112 Encounter for antineoplastic immunotherapy: Secondary | ICD-10-CM

## 2011-09-27 LAB — CBC WITH DIFFERENTIAL/PLATELET
BASO%: 0.1 % (ref 0.0–2.0)
Basophils Absolute: 0 10*3/uL (ref 0.0–0.1)
EOS%: 1.2 % (ref 0.0–7.0)
HCT: 33.2 % — ABNORMAL LOW (ref 38.4–49.9)
HGB: 11.3 g/dL — ABNORMAL LOW (ref 13.0–17.1)
LYMPH%: 8.3 % — ABNORMAL LOW (ref 14.0–49.0)
MCH: 32.6 pg (ref 27.2–33.4)
MCHC: 33.8 g/dL (ref 32.0–36.0)
MONO#: 0.7 10*3/uL (ref 0.1–0.9)
NEUT%: 84.1 % — ABNORMAL HIGH (ref 39.0–75.0)
Platelets: 311 10*3/uL (ref 140–400)

## 2011-09-27 LAB — COMPREHENSIVE METABOLIC PANEL
ALT: 35 U/L (ref 0–53)
BUN: 31 mg/dL — ABNORMAL HIGH (ref 6–23)
CO2: 29 mEq/L (ref 19–32)
Calcium: 8.8 mg/dL (ref 8.4–10.5)
Creatinine, Ser: 1.05 mg/dL (ref 0.50–1.35)
Total Bilirubin: 0.8 mg/dL (ref 0.3–1.2)

## 2011-09-27 LAB — PROTEIN ELECTROPHORESIS, SERUM
Alpha-1-Globulin: 9.3 % — ABNORMAL HIGH (ref 2.9–4.9)
Alpha-2-Globulin: 20 % — ABNORMAL HIGH (ref 7.1–11.8)
Beta Globulin: 5.1 % (ref 4.7–7.2)
Total Protein, Serum Electrophoresis: 5.8 g/dL — ABNORMAL LOW (ref 6.0–8.3)

## 2011-09-27 LAB — KAPPA/LAMBDA LIGHT CHAINS
Kappa free light chain: 5.58 mg/dL — ABNORMAL HIGH (ref 0.33–1.94)
Kappa:Lambda Ratio: 5.07 — ABNORMAL HIGH (ref 0.26–1.65)
Lambda Free Lght Chn: 1.1 mg/dL (ref 0.57–2.63)

## 2011-09-29 LAB — UIFE/LIGHT CHAINS/TP QN, 24-HR UR
Free Kappa Lt Chains,Ur: 2.68 mg/dL — ABNORMAL HIGH (ref 0.14–2.42)
Free Kappa/Lambda Ratio: 89.33 ratio — ABNORMAL HIGH (ref 2.04–10.37)
Free Lambda Excretion/Day: 0.9 mg/d
Free Lambda Lt Chains,Ur: 0.03 mg/dL (ref 0.02–0.67)
Free Lt Chn Excr Rate: 80.4 mg/d
Gamma Globulin, Urine: DETECTED — AB
Time: 24 hours
Total Protein, Urine: 3.2 mg/dL
Volume, Urine: 3000 mL

## 2011-09-30 ENCOUNTER — Encounter (HOSPITAL_BASED_OUTPATIENT_CLINIC_OR_DEPARTMENT_OTHER): Payer: Medicare Other | Admitting: Oncology

## 2011-09-30 ENCOUNTER — Other Ambulatory Visit: Payer: Self-pay | Admitting: Oncology

## 2011-09-30 DIAGNOSIS — R0989 Other specified symptoms and signs involving the circulatory and respiratory systems: Secondary | ICD-10-CM

## 2011-09-30 DIAGNOSIS — Z5111 Encounter for antineoplastic chemotherapy: Secondary | ICD-10-CM

## 2011-09-30 DIAGNOSIS — E8589 Other amyloidosis: Secondary | ICD-10-CM

## 2011-09-30 DIAGNOSIS — R0602 Shortness of breath: Secondary | ICD-10-CM

## 2011-09-30 DIAGNOSIS — Z5112 Encounter for antineoplastic immunotherapy: Secondary | ICD-10-CM

## 2011-09-30 DIAGNOSIS — R609 Edema, unspecified: Secondary | ICD-10-CM

## 2011-09-30 DIAGNOSIS — R0609 Other forms of dyspnea: Secondary | ICD-10-CM

## 2011-09-30 LAB — CBC WITH DIFFERENTIAL/PLATELET
EOS%: 0.7 % (ref 0.0–7.0)
Eosinophils Absolute: 0.1 10*3/uL (ref 0.0–0.5)
LYMPH%: 7.6 % — ABNORMAL LOW (ref 14.0–49.0)
MCH: 32.2 pg (ref 27.2–33.4)
MCV: 97.2 fL (ref 79.3–98.0)
MONO%: 6.5 % (ref 0.0–14.0)
NEUT#: 9.7 10*3/uL — ABNORMAL HIGH (ref 1.5–6.5)
Platelets: 341 10*3/uL (ref 140–400)
RBC: 3.55 10*6/uL — ABNORMAL LOW (ref 4.20–5.82)
RDW: 13.5 % (ref 11.0–14.6)

## 2011-10-01 LAB — PROTHROMBIN TIME
INR: 1.02 (ref <1.50)
Prothrombin Time: 13.8 seconds (ref 11.6–15.2)

## 2011-10-01 LAB — COMPREHENSIVE METABOLIC PANEL
AST: 16 U/L (ref 0–37)
Alkaline Phosphatase: 105 U/L (ref 39–117)
BUN: 16 mg/dL (ref 6–23)
Glucose, Bld: 98 mg/dL (ref 70–99)
Potassium: 4.5 mEq/L (ref 3.5–5.3)
Sodium: 141 mEq/L (ref 135–145)
Total Bilirubin: 0.3 mg/dL (ref 0.3–1.2)
Total Protein: 6.2 g/dL (ref 6.0–8.3)

## 2011-10-01 LAB — KAPPA/LAMBDA LIGHT CHAINS
Kappa:Lambda Ratio: 3.43 — ABNORMAL HIGH (ref 0.26–1.65)
Lambda Free Lght Chn: 1.44 mg/dL (ref 0.57–2.63)

## 2011-10-07 ENCOUNTER — Other Ambulatory Visit (HOSPITAL_COMMUNITY): Payer: Self-pay | Admitting: Oncology

## 2011-10-07 ENCOUNTER — Encounter (HOSPITAL_BASED_OUTPATIENT_CLINIC_OR_DEPARTMENT_OTHER): Payer: Medicare Other | Admitting: Oncology

## 2011-10-07 DIAGNOSIS — E859 Amyloidosis, unspecified: Secondary | ICD-10-CM

## 2011-10-07 DIAGNOSIS — E8589 Other amyloidosis: Secondary | ICD-10-CM

## 2011-10-07 DIAGNOSIS — R609 Edema, unspecified: Secondary | ICD-10-CM

## 2011-10-07 DIAGNOSIS — Z5112 Encounter for antineoplastic immunotherapy: Secondary | ICD-10-CM

## 2011-10-07 DIAGNOSIS — Z5111 Encounter for antineoplastic chemotherapy: Secondary | ICD-10-CM

## 2011-10-07 LAB — CBC WITH DIFFERENTIAL/PLATELET
BASO%: 0.6 % (ref 0.0–2.0)
EOS%: 0.2 % (ref 0.0–7.0)
MCH: 30.9 pg (ref 27.2–33.4)
MCHC: 32.6 g/dL (ref 32.0–36.0)
MONO#: 1.1 10*3/uL — ABNORMAL HIGH (ref 0.1–0.9)
RDW: 13.2 % (ref 11.0–14.6)
WBC: 12.6 10*3/uL — ABNORMAL HIGH (ref 4.0–10.3)
lymph#: 1.3 10*3/uL (ref 0.9–3.3)
nRBC: 0 % (ref 0–0)

## 2011-10-15 ENCOUNTER — Other Ambulatory Visit (HOSPITAL_COMMUNITY): Payer: Self-pay | Admitting: Oncology

## 2011-10-15 ENCOUNTER — Encounter (HOSPITAL_BASED_OUTPATIENT_CLINIC_OR_DEPARTMENT_OTHER): Payer: BLUE CROSS/BLUE SHIELD | Admitting: Oncology

## 2011-10-15 DIAGNOSIS — Z5111 Encounter for antineoplastic chemotherapy: Secondary | ICD-10-CM

## 2011-10-15 DIAGNOSIS — E8589 Other amyloidosis: Secondary | ICD-10-CM

## 2011-10-15 DIAGNOSIS — Z5112 Encounter for antineoplastic immunotherapy: Secondary | ICD-10-CM

## 2011-10-15 LAB — CBC WITH DIFFERENTIAL/PLATELET
Eosinophils Absolute: 0.1 10*3/uL (ref 0.0–0.5)
MCV: 95.4 fL (ref 79.3–98.0)
MONO#: 1.2 10*3/uL — ABNORMAL HIGH (ref 0.1–0.9)
MONO%: 10.4 % (ref 0.0–14.0)
NEUT#: 8.5 10*3/uL — ABNORMAL HIGH (ref 1.5–6.5)
RBC: 4.14 10*6/uL — ABNORMAL LOW (ref 4.20–5.82)
RDW: 13.6 % (ref 11.0–14.6)
WBC: 11.2 10*3/uL — ABNORMAL HIGH (ref 4.0–10.3)
lymph#: 1.4 10*3/uL (ref 0.9–3.3)
nRBC: 0 % (ref 0–0)

## 2011-10-28 ENCOUNTER — Ambulatory Visit (HOSPITAL_BASED_OUTPATIENT_CLINIC_OR_DEPARTMENT_OTHER): Payer: Medicare Other

## 2011-10-28 ENCOUNTER — Other Ambulatory Visit: Payer: Self-pay | Admitting: Oncology

## 2011-10-28 VITALS — BP 147/85 | HR 77 | Temp 97.3°F

## 2011-10-28 DIAGNOSIS — Z5112 Encounter for antineoplastic immunotherapy: Secondary | ICD-10-CM

## 2011-10-28 DIAGNOSIS — E859 Amyloidosis, unspecified: Secondary | ICD-10-CM

## 2011-10-28 DIAGNOSIS — E8589 Other amyloidosis: Secondary | ICD-10-CM

## 2011-10-28 LAB — CBC WITH DIFFERENTIAL/PLATELET
BASO%: 0.1 % (ref 0.0–2.0)
HCT: 40.3 % (ref 38.4–49.9)
MCHC: 33 g/dL (ref 32.0–36.0)
MONO#: 0.6 10*3/uL (ref 0.1–0.9)
NEUT#: 7.7 10*3/uL — ABNORMAL HIGH (ref 1.5–6.5)
NEUT%: 83.4 % — ABNORMAL HIGH (ref 39.0–75.0)
WBC: 9.3 10*3/uL (ref 4.0–10.3)
lymph#: 0.9 10*3/uL (ref 0.9–3.3)

## 2011-10-28 MED ORDER — ONDANSETRON HCL 8 MG PO TABS
8.0000 mg | ORAL_TABLET | Freq: Once | ORAL | Status: AC
  Administered 2011-10-28: 8 mg via ORAL

## 2011-10-28 MED ORDER — BORTEZOMIB CHEMO SQ INJECTION 3.5 MG (2.5MG/ML)
3.1000 mg | Freq: Once | INTRAMUSCULAR | Status: AC
  Administered 2011-10-28: 3 mg via SUBCUTANEOUS
  Filled 2011-10-28: qty 3

## 2011-10-29 LAB — KAPPA/LAMBDA LIGHT CHAINS: Kappa free light chain: 3.43 mg/dL — ABNORMAL HIGH (ref 0.33–1.94)

## 2011-10-29 LAB — COMPREHENSIVE METABOLIC PANEL
ALT: 17 U/L (ref 0–53)
Albumin: 3.8 g/dL (ref 3.5–5.2)
CO2: 32 mEq/L (ref 19–32)
Calcium: 9.4 mg/dL (ref 8.4–10.5)
Chloride: 103 mEq/L (ref 96–112)
Creatinine, Ser: 0.98 mg/dL (ref 0.50–1.35)
Potassium: 4 mEq/L (ref 3.5–5.3)
Total Protein: 6.2 g/dL (ref 6.0–8.3)

## 2011-11-05 ENCOUNTER — Other Ambulatory Visit (HOSPITAL_BASED_OUTPATIENT_CLINIC_OR_DEPARTMENT_OTHER): Payer: Medicare Other | Admitting: Lab

## 2011-11-05 ENCOUNTER — Other Ambulatory Visit: Payer: Self-pay | Admitting: Oncology

## 2011-11-05 ENCOUNTER — Ambulatory Visit (HOSPITAL_BASED_OUTPATIENT_CLINIC_OR_DEPARTMENT_OTHER): Payer: Medicare Other

## 2011-11-05 VITALS — BP 138/72 | HR 61 | Temp 97.9°F

## 2011-11-05 DIAGNOSIS — E8589 Other amyloidosis: Secondary | ICD-10-CM

## 2011-11-05 DIAGNOSIS — Z5112 Encounter for antineoplastic immunotherapy: Secondary | ICD-10-CM

## 2011-11-05 DIAGNOSIS — E859 Amyloidosis, unspecified: Secondary | ICD-10-CM

## 2011-11-05 LAB — CBC WITH DIFFERENTIAL/PLATELET
Basophils Absolute: 0 10*3/uL (ref 0.0–0.1)
Eosinophils Absolute: 0.1 10*3/uL (ref 0.0–0.5)
HGB: 13.2 g/dL (ref 13.0–17.1)
MONO#: 1 10*3/uL — ABNORMAL HIGH (ref 0.1–0.9)
NEUT#: 8.1 10*3/uL — ABNORMAL HIGH (ref 1.5–6.5)
RDW: 13.7 % (ref 11.0–14.6)
WBC: 10.8 10*3/uL — ABNORMAL HIGH (ref 4.0–10.3)
lymph#: 1.6 10*3/uL (ref 0.9–3.3)

## 2011-11-05 MED ORDER — BORTEZOMIB CHEMO SQ INJECTION 3.5 MG (2.5MG/ML)
3.1000 mg | Freq: Once | INTRAMUSCULAR | Status: AC
  Administered 2011-11-05: 3 mg via SUBCUTANEOUS
  Filled 2011-11-05: qty 3

## 2011-11-05 MED ORDER — ONDANSETRON HCL 8 MG PO TABS
8.0000 mg | ORAL_TABLET | Freq: Once | ORAL | Status: AC
  Administered 2011-11-05: 8 mg via ORAL

## 2011-11-10 ENCOUNTER — Other Ambulatory Visit: Payer: Self-pay | Admitting: *Deleted

## 2011-11-10 DIAGNOSIS — R600 Localized edema: Secondary | ICD-10-CM

## 2011-11-10 MED ORDER — FUROSEMIDE 40 MG PO TABS: 40.0000 mg | ORAL_TABLET | Freq: Two times a day (BID) | ORAL | Status: DC

## 2011-11-11 ENCOUNTER — Other Ambulatory Visit: Payer: Self-pay | Admitting: *Deleted

## 2011-11-11 ENCOUNTER — Telehealth: Payer: Self-pay | Admitting: Oncology

## 2011-11-11 ENCOUNTER — Ambulatory Visit (HOSPITAL_BASED_OUTPATIENT_CLINIC_OR_DEPARTMENT_OTHER): Payer: Medicare Other

## 2011-11-11 ENCOUNTER — Other Ambulatory Visit: Payer: Self-pay | Admitting: Oncology

## 2011-11-11 ENCOUNTER — Other Ambulatory Visit (HOSPITAL_BASED_OUTPATIENT_CLINIC_OR_DEPARTMENT_OTHER): Payer: Medicare Other | Admitting: Lab

## 2011-11-11 VITALS — BP 133/76 | HR 53 | Temp 97.1°F

## 2011-11-11 DIAGNOSIS — E859 Amyloidosis, unspecified: Secondary | ICD-10-CM

## 2011-11-11 DIAGNOSIS — Z5112 Encounter for antineoplastic immunotherapy: Secondary | ICD-10-CM

## 2011-11-11 DIAGNOSIS — E8589 Other amyloidosis: Secondary | ICD-10-CM

## 2011-11-11 LAB — COMPREHENSIVE METABOLIC PANEL
Albumin: 3.7 g/dL (ref 3.5–5.2)
Alkaline Phosphatase: 87 U/L (ref 39–117)
BUN: 18 mg/dL (ref 6–23)
CO2: 29 mEq/L (ref 19–32)
Glucose, Bld: 100 mg/dL — ABNORMAL HIGH (ref 70–99)
Potassium: 4.6 mEq/L (ref 3.5–5.3)

## 2011-11-11 LAB — CBC WITH DIFFERENTIAL/PLATELET
Basophils Absolute: 0 10*3/uL (ref 0.0–0.1)
Eosinophils Absolute: 0 10*3/uL (ref 0.0–0.5)
HGB: 12.9 g/dL — ABNORMAL LOW (ref 13.0–17.1)
LYMPH%: 10.8 % — ABNORMAL LOW (ref 14.0–49.0)
MCV: 96.9 fL (ref 79.3–98.0)
MONO#: 0.7 10*3/uL (ref 0.1–0.9)
MONO%: 8.6 % (ref 0.0–14.0)
NEUT#: 6.7 10*3/uL — ABNORMAL HIGH (ref 1.5–6.5)
Platelets: 162 10*3/uL (ref 140–400)
RDW: 14.7 % — ABNORMAL HIGH (ref 11.0–14.6)

## 2011-11-11 MED ORDER — BORTEZOMIB CHEMO SQ INJECTION 3.5 MG (2.5MG/ML)
3.1000 mg | Freq: Once | INTRAMUSCULAR | Status: AC
  Administered 2011-11-11: 3 mg via SUBCUTANEOUS
  Filled 2011-11-11: qty 3

## 2011-11-11 MED ORDER — ONDANSETRON HCL 8 MG PO TABS
8.0000 mg | ORAL_TABLET | Freq: Once | ORAL | Status: AC
  Administered 2011-11-11: 8 mg via ORAL

## 2011-11-15 ENCOUNTER — Encounter: Payer: Self-pay | Admitting: *Deleted

## 2011-11-18 ENCOUNTER — Telehealth: Payer: Self-pay | Admitting: Pulmonary Disease

## 2011-11-18 ENCOUNTER — Other Ambulatory Visit (HOSPITAL_BASED_OUTPATIENT_CLINIC_OR_DEPARTMENT_OTHER): Payer: Medicare Other | Admitting: Lab

## 2011-11-18 DIAGNOSIS — E859 Amyloidosis, unspecified: Secondary | ICD-10-CM

## 2011-11-18 LAB — CBC WITH DIFFERENTIAL/PLATELET
Basophils Absolute: 0 10*3/uL (ref 0.0–0.1)
Eosinophils Absolute: 0 10*3/uL (ref 0.0–0.5)
HGB: 13.4 g/dL (ref 13.0–17.1)
LYMPH%: 8.4 % — ABNORMAL LOW (ref 14.0–49.0)
MCV: 96.5 fL (ref 79.3–98.0)
MONO%: 9.9 % (ref 0.0–14.0)
NEUT#: 7.6 10*3/uL — ABNORMAL HIGH (ref 1.5–6.5)
Platelets: 172 10*3/uL (ref 140–400)

## 2011-11-18 MED ORDER — AZITHROMYCIN 250 MG PO TABS: ORAL_TABLET | ORAL | Status: AC

## 2011-11-18 NOTE — Telephone Encounter (Signed)
Rx sent to pharm for zpack and spoke with pt and notified that this was done.

## 2011-11-18 NOTE — Telephone Encounter (Signed)
Spoke with pt. He c/o dry cough, sneezing and sore throat x 2 days- requests zpack. Please advise if okay to send this in thanks! No Known Allergies

## 2011-11-18 NOTE — Telephone Encounter (Signed)
Per SN--ok for zpak #1  Take as directed with no refills.

## 2011-11-19 LAB — COMPREHENSIVE METABOLIC PANEL
Alkaline Phosphatase: 79 U/L (ref 39–117)
BUN: 28 mg/dL — ABNORMAL HIGH (ref 6–23)
Glucose, Bld: 80 mg/dL (ref 70–99)
Sodium: 142 mEq/L (ref 135–145)
Total Bilirubin: 0.5 mg/dL (ref 0.3–1.2)

## 2011-11-25 ENCOUNTER — Ambulatory Visit (HOSPITAL_BASED_OUTPATIENT_CLINIC_OR_DEPARTMENT_OTHER): Payer: Medicare Other | Admitting: Oncology

## 2011-11-25 ENCOUNTER — Ambulatory Visit: Payer: Medicare Other

## 2011-11-25 ENCOUNTER — Other Ambulatory Visit: Payer: Self-pay | Admitting: Oncology

## 2011-11-25 ENCOUNTER — Other Ambulatory Visit (HOSPITAL_BASED_OUTPATIENT_CLINIC_OR_DEPARTMENT_OTHER): Payer: Medicare Other | Admitting: Lab

## 2011-11-25 ENCOUNTER — Ambulatory Visit (HOSPITAL_BASED_OUTPATIENT_CLINIC_OR_DEPARTMENT_OTHER): Payer: Medicare Other

## 2011-11-25 DIAGNOSIS — E8589 Other amyloidosis: Secondary | ICD-10-CM

## 2011-11-25 DIAGNOSIS — E859 Amyloidosis, unspecified: Secondary | ICD-10-CM

## 2011-11-25 DIAGNOSIS — R0609 Other forms of dyspnea: Secondary | ICD-10-CM

## 2011-11-25 DIAGNOSIS — I10 Essential (primary) hypertension: Secondary | ICD-10-CM

## 2011-11-25 DIAGNOSIS — R609 Edema, unspecified: Secondary | ICD-10-CM

## 2011-11-25 DIAGNOSIS — R0989 Other specified symptoms and signs involving the circulatory and respiratory systems: Secondary | ICD-10-CM

## 2011-11-25 DIAGNOSIS — Z5112 Encounter for antineoplastic immunotherapy: Secondary | ICD-10-CM

## 2011-11-25 LAB — CBC WITH DIFFERENTIAL/PLATELET
BASO%: 1.1 % (ref 0.0–2.0)
LYMPH%: 20.9 % (ref 14.0–49.0)
MCHC: 32.5 g/dL (ref 32.0–36.0)
MONO#: 1.1 10*3/uL — ABNORMAL HIGH (ref 0.1–0.9)
MONO%: 17.5 % — ABNORMAL HIGH (ref 0.0–14.0)
Platelets: 175 10*3/uL (ref 140–400)
RBC: 4.34 10*6/uL (ref 4.20–5.82)
WBC: 6.3 10*3/uL (ref 4.0–10.3)
nRBC: 0 % (ref 0–0)

## 2011-11-25 LAB — COMPREHENSIVE METABOLIC PANEL
ALT: 38 U/L (ref 0–53)
AST: 42 U/L — ABNORMAL HIGH (ref 0–37)
Albumin: 3.6 g/dL (ref 3.5–5.2)
Alkaline Phosphatase: 91 U/L (ref 39–117)
BUN: 24 mg/dL — ABNORMAL HIGH (ref 6–23)
Calcium: 8.8 mg/dL (ref 8.4–10.5)
Chloride: 102 mEq/L (ref 96–112)
Potassium: 4.1 mEq/L (ref 3.5–5.3)
Sodium: 141 mEq/L (ref 135–145)
Total Protein: 6 g/dL (ref 6.0–8.3)

## 2011-11-25 MED ORDER — ONDANSETRON HCL 8 MG PO TABS
8.0000 mg | ORAL_TABLET | Freq: Once | ORAL | Status: AC
  Administered 2011-11-25: 8 mg via ORAL

## 2011-11-25 MED ORDER — BORTEZOMIB CHEMO SQ INJECTION 3.5 MG (2.5MG/ML)
3.1000 mg | Freq: Once | INTRAMUSCULAR | Status: AC
  Administered 2011-11-25: 3 mg via SUBCUTANEOUS
  Filled 2011-11-25: qty 3

## 2011-11-25 NOTE — Progress Notes (Signed)
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  Negative cytogenetics and FISH myeloma panel.  Cardiac MRI showed no sign of cardiac dysfunction.  PAST TREATMENT: Velcade, dexamethasone, melphalan x 3 cycles.  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  CURRENT THERAPY:  started salvage Velcade for persistent serum free light chain on 09/17/2010 (3 weeks on, 1 week off).  INTERVAL HISTORY: Johnny Mitchell 70 y.o. male returns for regular follow up.  He recently has URI symptoms with nasal congestion without productive cough, fever, chest pain.  He has recently decreased his Lasix to qday as opposed to BID.  He went to [employer] and just came back yesterday.  He noticed that he had DOE walking more than 2-3 blocks.  He denied CP, PND, orhopnea.  His pedal edema is stable.  His back pain from OA was stable with pain meds.   Patient denies fatigue, headache, visual changes, confusion, drenching night sweats, palpable lymph node swelling, mucositis, odynophagia, dysphagia, nausea vomiting, jaundice, chest pain, palpitation, shortness of breath, dyspnea on exertion, productive cough, gum bleeding, epistaxis, hematemesis, hemoptysis, abdominal pain, abdominal swelling, early satiety, melena, hematochezia, hematuria, skin rash, spontaneous bleeding, joint swelling, joint pain, heat or cold intolerance, bowel bladder incontinence, focal motor weakness, paresthesia, depression, suicidal or homocidal ideation, feeling hopelessness.   MEDICAL HISTORY: Past Medical History  Diagnosis Date  . Hypertension   . Coronary atherosclerosis of unspecified type of vessel, native or graft   . Cerebrovascular disease, unspecified   . Pure hypercholesterolemia   . Diverticulosis of colon  (without mention of hemorrhage)   . Benign neoplasm of colon   . Unspecified hemorrhoids without mention of complication   . Degenerative disc disease   . 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 Low back pain   . Hemangioma   . Amyloidosis   . LBBB (left bundle branch block)   . 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 OSA (obstructive sleep apnea)     SURGICAL HISTORY:  Past Surgical History  Procedure Date  . Knee arthroscopy   . Carpal tunnel release 08/2006    Dr Johnny Mitchell at [facility] - Bilateral  . 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 acyclovir (ZOVIRAX) 400 MG tablet Take 400 mg by mouth 2 (two) times daily.       Marland Kitchen atenolol (TENORMIN) 100 MG tablet Take 1 tablet (100 mg total) by mouth daily.  90 tablet  3  . furosemide (LASIX) 40 MG tablet Take 40 mg by mouth 2 (two) times daily. Once or twice a day        No current facility-administered medications for this visit.   Facility-Administered Medications Ordered in Other Visits  Medication Dose Route Frequency Provider Last Rate Last Dose  . bortezomib SQ (VELCADE) chemo injection 3 mg  3 mg Subcutaneous Once Johnny Bolus, MD      . ondansetron [facility]) tablet 8 mg  8 mg Oral Once Johnny Bolus, MD   8 mg at 11/25/11 1121    ALLERGIES:   has no known allergies.  REVIEW OF SYSTEMS:  The rest of the 14-point review of system was negative.  Filed Vitals:   11/25/11 1032  BP: 128/77  Pulse: 69  Temp: 97 F (36.1 C)   Wt Readings from Last 3 Encounters:  11/25/11 268 lb 9.6 oz (121.836 kg)  09/30/11 266 lb 11.2 oz (120.974 kg)  05/17/11 265 lb (120.203 kg)   ECOG Performance status: 1  PHYSICAL EXAMINATION:   General:  mildly obese man in no acute distress.  Eyes:  no scleral icterus.  ENT:  He had an ulceration in the right oropharynx, however, I do not feel this is consistent with oral  thrush, more in the line of ulcerations from the recent intubation. ENT:  There were no oropharyngeal lesions.  Neck was without thyromegaly.  Lymphatics:  Negative cervical, supraclavicular or axillary adenopathy.  Respiratory: lungs were clear bilaterally without wheezing or crackles.  Cardiovascular:  Regular rate and rhythm, S1/S2, without murmur, rub or gallop.  He has 1+ bilateral pedal edema to mid shin GI:  abdomen was soft, flat, nontender, nondistended, without organomegaly.  Muscoloskeletal:  no spinal tenderness of palpation of vertebral spine.  He had bilateral incisional scars around the lower lumbar area that are well-healed and intact without erythema or pain to palpation.Skin exam was without echymosis, petichae.  Neuro exam was nonfocal.  Patient was able to get on and off exam table without assistance.  Gait was normal.  Patient was alerted and oriented.  Attention was good.   Language was appropriate.  Mood was normal without depression.  Speech was not pressured.  Thought content was not tangential.     LABORATORY/RADIOLOGY DATA:  Lab Results  Component Value Date   WBC 6.3 11/25/2011   HGB 13.5 11/25/2011   HCT 41.6 11/25/2011   PLT 175 11/25/2011   GLUCOSE 80 11/18/2011   CHOL 155 01/15/2009   TRIG 77 01/15/2009   HDL 39.9 01/15/2009   LDLCALC 100* 01/15/2009   ALT 17 11/18/2011   AST 15 11/18/2011   NA 142 11/18/2011   K 4.7 11/18/2011   CL 105 11/18/2011   CREATININE 1.06 11/18/2011   BUN 28* 11/18/2011   CO2 31 11/18/2011   TSH 1.167 01/17/2011   PSA 0.81 01/15/2009   INR 1.02 09/30/2011   INR 1.02 09/30/2011     ASSESSMENT AND PLAN:   1. History of amyloidosis:  I discussed with [name] that both serum light chain slightly increased, but this minimal.  He has not had dose-limiting toxicity such has fatigue, cytopenia, neuropathy. I do not advocate switching to carfilzomib since it's only approved at this time for myeloma; and much less data for amyloidosis.  In addition, the  light chain has been quite stable.  In the future, if his light chain significantly increased, carfilzomib maybe an option.  However, I defer the final decision to Dr. Shellee Milo.  I recommended to continue weekly Velcade SQ 3 wks on, 1 wk off.   2. DOE:  He had a cardiac MRI at [facility] last week.  Result is pending.  Unclear is there is a component of URI and Lasix only daily. 3. Pedal edema.  Possibly secondary to venous stasis.  It's stable today without much symptoms.  I advised him to increase his Lasix back up to BID.  We'll check BMET regularly while he increases his Lasix.  4. Hypertension.  Well controlled on atenolol and Lasix per Dr. Jens Som. 5. Chronic lower back pain:  recent operation with Ortho from [place] with significant improvement.  He is on pain meds sporadically per her report.  6. Follow up:  Weekly CBC, BMET and Velcade SQ.  He will come back on the week off 12/16/11 for light chain assessment.

## 2011-11-26 ENCOUNTER — Telehealth: Payer: Self-pay | Admitting: Oncology

## 2011-11-26 NOTE — Telephone Encounter (Signed)
called pt with 12/20 12/27 1/3 and 1/10 appts,  Also faxed to [phone]/[facility]

## 2011-11-29 ENCOUNTER — Other Ambulatory Visit: Payer: Self-pay | Admitting: Oncology

## 2011-11-29 ENCOUNTER — Telehealth: Payer: Self-pay | Admitting: *Deleted

## 2011-11-29 NOTE — Telephone Encounter (Signed)
Message on voicemail from pt stating that he needs appt on Thursday 12/02/11 moved to Friday 12/03/11 due to having an appt at [facility] on 12/02/11 with Dr. Donnie Coffin. Notified MD who states he will move orders to 12/03/11. Will notify scheduling dept to change appt d/t

## 2011-12-02 ENCOUNTER — Ambulatory Visit: Payer: Medicare Other

## 2011-12-02 ENCOUNTER — Other Ambulatory Visit: Payer: Medicare Other | Admitting: Lab

## 2011-12-03 ENCOUNTER — Ambulatory Visit (HOSPITAL_BASED_OUTPATIENT_CLINIC_OR_DEPARTMENT_OTHER): Payer: Medicare Other

## 2011-12-03 ENCOUNTER — Other Ambulatory Visit (HOSPITAL_BASED_OUTPATIENT_CLINIC_OR_DEPARTMENT_OTHER): Payer: Medicare Other | Admitting: Lab

## 2011-12-03 VITALS — BP 125/72 | HR 65 | Temp 98.0°F

## 2011-12-03 DIAGNOSIS — E8589 Other amyloidosis: Secondary | ICD-10-CM

## 2011-12-03 DIAGNOSIS — E859 Amyloidosis, unspecified: Secondary | ICD-10-CM

## 2011-12-03 DIAGNOSIS — Z5112 Encounter for antineoplastic immunotherapy: Secondary | ICD-10-CM

## 2011-12-03 LAB — CBC WITH DIFFERENTIAL/PLATELET
BASO%: 0.2 % (ref 0.0–2.0)
Basophils Absolute: 0 10e3/uL (ref 0.0–0.1)
EOS%: 0.7 % (ref 0.0–7.0)
Eosinophils Absolute: 0.1 10e3/uL (ref 0.0–0.5)
HCT: 44.3 % (ref 38.4–49.9)
HGB: 14.4 g/dL (ref 13.0–17.1)
LYMPH%: 8.9 % — ABNORMAL LOW (ref 14.0–49.0)
MCH: 31.2 pg (ref 27.2–33.4)
MCHC: 32.5 g/dL (ref 32.0–36.0)
MCV: 95.9 fL (ref 79.3–98.0)
MONO#: 0.7 10e3/uL (ref 0.1–0.9)
MONO%: 6.7 % (ref 0.0–14.0)
NEUT#: 8.7 10e3/uL — ABNORMAL HIGH (ref 1.5–6.5)
NEUT%: 83.5 % — ABNORMAL HIGH (ref 39.0–75.0)
Platelets: 216 10e3/uL (ref 140–400)
RBC: 4.62 10e6/uL (ref 4.20–5.82)
RDW: 14 % (ref 11.0–14.6)
WBC: 10.5 10e3/uL — ABNORMAL HIGH (ref 4.0–10.3)
lymph#: 0.9 10e3/uL (ref 0.9–3.3)

## 2011-12-03 LAB — BASIC METABOLIC PANEL
BUN: 16 mg/dL (ref 6–23)
Creatinine, Ser: 1.12 mg/dL (ref 0.50–1.35)
Potassium: 4.8 mEq/L (ref 3.5–5.3)

## 2011-12-03 MED ORDER — BORTEZOMIB CHEMO SQ INJECTION 3.5 MG (2.5MG/ML)
3.1000 mg | Freq: Once | INTRAMUSCULAR | Status: AC
  Administered 2011-12-03: 3 mg via SUBCUTANEOUS
  Filled 2011-12-03: qty 3

## 2011-12-03 MED ORDER — ONDANSETRON HCL 8 MG PO TABS
8.0000 mg | ORAL_TABLET | Freq: Once | ORAL | Status: AC
  Administered 2011-12-03: 8 mg via ORAL

## 2011-12-09 ENCOUNTER — Other Ambulatory Visit (HOSPITAL_BASED_OUTPATIENT_CLINIC_OR_DEPARTMENT_OTHER): Payer: Medicare Other | Admitting: Lab

## 2011-12-09 ENCOUNTER — Ambulatory Visit (HOSPITAL_BASED_OUTPATIENT_CLINIC_OR_DEPARTMENT_OTHER): Payer: Medicare Other

## 2011-12-09 VITALS — BP 114/69 | HR 67 | Temp 98.0°F

## 2011-12-09 DIAGNOSIS — Z5112 Encounter for antineoplastic immunotherapy: Secondary | ICD-10-CM

## 2011-12-09 DIAGNOSIS — E859 Amyloidosis, unspecified: Secondary | ICD-10-CM

## 2011-12-09 DIAGNOSIS — E8589 Other amyloidosis: Secondary | ICD-10-CM

## 2011-12-09 LAB — CBC WITH DIFFERENTIAL/PLATELET
BASO%: 0.3 % (ref 0.0–2.0)
Basophils Absolute: 0 10*3/uL (ref 0.0–0.1)
EOS%: 0.7 % (ref 0.0–7.0)
HGB: 14.3 g/dL (ref 13.0–17.1)
MCH: 31.9 pg (ref 27.2–33.4)
MCHC: 33.4 g/dL (ref 32.0–36.0)
MCV: 95.3 fL (ref 79.3–98.0)
MONO%: 9.7 % (ref 0.0–14.0)
RBC: 4.49 10*6/uL (ref 4.20–5.82)
RDW: 14.1 % (ref 11.0–14.6)
lymph#: 0.9 10*3/uL (ref 0.9–3.3)

## 2011-12-09 LAB — BASIC METABOLIC PANEL
BUN: 29 mg/dL — ABNORMAL HIGH (ref 6–23)
CO2: 33 mEq/L — ABNORMAL HIGH (ref 19–32)
Calcium: 9.1 mg/dL (ref 8.4–10.5)
Chloride: 102 mEq/L (ref 96–112)
Creatinine, Ser: 1.18 mg/dL (ref 0.50–1.35)

## 2011-12-09 MED ORDER — ONDANSETRON HCL 8 MG PO TABS
8.0000 mg | ORAL_TABLET | Freq: Once | ORAL | Status: AC
  Administered 2011-12-09: 8 mg via ORAL

## 2011-12-09 MED ORDER — BORTEZOMIB CHEMO SQ INJECTION 3.5 MG (2.5MG/ML)
3.1000 mg | Freq: Once | INTRAMUSCULAR | Status: AC
  Administered 2011-12-09: 3 mg via SUBCUTANEOUS
  Filled 2011-12-09: qty 3

## 2011-12-16 ENCOUNTER — Other Ambulatory Visit (HOSPITAL_BASED_OUTPATIENT_CLINIC_OR_DEPARTMENT_OTHER): Payer: Medicare Other | Admitting: Lab

## 2011-12-16 DIAGNOSIS — E8589 Other amyloidosis: Secondary | ICD-10-CM | POA: Diagnosis not present

## 2011-12-17 ENCOUNTER — Other Ambulatory Visit: Payer: Self-pay | Admitting: Oncology

## 2011-12-17 DIAGNOSIS — E859 Amyloidosis, unspecified: Secondary | ICD-10-CM | POA: Diagnosis not present

## 2011-12-17 LAB — KAPPA/LAMBDA LIGHT CHAINS
Kappa free light chain: 3.95 mg/dL — ABNORMAL HIGH (ref 0.33–1.94)
Kappa:Lambda Ratio: 2.6 — ABNORMAL HIGH (ref 0.26–1.65)
Lambda Free Lght Chn: 1.52 mg/dL (ref 0.57–2.63)

## 2011-12-17 LAB — COMPREHENSIVE METABOLIC PANEL
ALT: 20 U/L (ref 0–53)
AST: 28 U/L (ref 0–37)
Alkaline Phosphatase: 88 U/L (ref 39–117)
BUN: 17 mg/dL (ref 6–23)
Calcium: 8.9 mg/dL (ref 8.4–10.5)
Chloride: 107 mEq/L (ref 96–112)
Creatinine, Ser: 0.92 mg/dL (ref 0.50–1.35)
Total Bilirubin: 0.4 mg/dL (ref 0.3–1.2)

## 2011-12-17 LAB — CREATININE CLEARANCE, URINE, 24 HOUR
Collection Interval-CRCL: 24 hours
Creatinine, 24H Ur: 2204 mg/d — ABNORMAL HIGH (ref 800–2000)
Creatinine, Urine: 71.1 mg/dL

## 2011-12-20 ENCOUNTER — Other Ambulatory Visit: Payer: Self-pay | Admitting: Oncology

## 2011-12-21 ENCOUNTER — Telehealth: Payer: Self-pay | Admitting: Oncology

## 2011-12-21 NOTE — Telephone Encounter (Signed)
S/w the pt and he is aware of his appts on 09/2012 and to pick up an appt calendar at that time

## 2011-12-23 ENCOUNTER — Ambulatory Visit (HOSPITAL_BASED_OUTPATIENT_CLINIC_OR_DEPARTMENT_OTHER): Payer: Medicare Other

## 2011-12-23 ENCOUNTER — Other Ambulatory Visit: Payer: Medicare Other | Admitting: Lab

## 2011-12-23 ENCOUNTER — Telehealth: Payer: Self-pay | Admitting: Oncology

## 2011-12-23 ENCOUNTER — Ambulatory Visit: Payer: Medicare Other | Admitting: Oncology

## 2011-12-23 VITALS — BP 125/70 | HR 66 | Temp 97.0°F | Ht 71.0 in | Wt 271.2 lb

## 2011-12-23 DIAGNOSIS — E859 Amyloidosis, unspecified: Secondary | ICD-10-CM | POA: Diagnosis not present

## 2011-12-23 LAB — CBC WITH DIFFERENTIAL/PLATELET
BASO%: 0.4 % (ref 0.0–2.0)
LYMPH%: 12.8 % — ABNORMAL LOW (ref 14.0–49.0)
MCHC: 32.5 g/dL (ref 32.0–36.0)
MONO#: 0.8 10*3/uL (ref 0.1–0.9)
MONO%: 9.3 % (ref 0.0–14.0)
NEUT#: 6.9 10*3/uL — ABNORMAL HIGH (ref 1.5–6.5)
Platelets: 189 10*3/uL (ref 140–400)
RBC: 4.36 10*6/uL (ref 4.20–5.82)
RDW: 13.7 % (ref 11.0–14.6)
WBC: 9 10*3/uL (ref 4.0–10.3)
nRBC: 0 % (ref 0–0)

## 2011-12-23 MED ORDER — BORTEZOMIB CHEMO SQ INJECTION 3.5 MG (2.5MG/ML)
3.1000 mg | Freq: Once | INTRAMUSCULAR | Status: AC
  Administered 2011-12-23: 3 mg via SUBCUTANEOUS
  Filled 2011-12-23: qty 3

## 2011-12-23 MED ORDER — ONDANSETRON HCL 8 MG PO TABS
8.0000 mg | ORAL_TABLET | Freq: Once | ORAL | Status: AC
  Administered 2011-12-23: 8 mg via ORAL

## 2011-12-23 NOTE — Progress Notes (Signed)
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  Negative cytogenetics and FISH myeloma panel.  Cardiac MRI showed no sign of cardiac dysfunction.  PAST TREATMENT: Velcade, dexamethasone, melphalan x 3 cycles.  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  CURRENT THERAPY:  started salvage Velcade for persistent serum free light chain on 09/17/2010 (3 weeks on, 1 week off).  INTERVAL HISTORY: Johnny Mitchell 71 y.o. male returns for regular follow up.  He reports feeling well.  He has lower extremity edema which he takes Lasix 1-2x/day with improvement.  He only has SOB and DOE with very heavy activity.  He still has chronic lower back pain despite ortho surgery last year.  He still takes pain med for that.  He denies worsening of this back pain, lower extremity paresthesia, weakness, or bowel/bladder incontinence.  He is tolerating Velcade well without skin rash, neuropathy, fever, infection, fatigue.    Patient denies fatigue, headache, visual changes, confusion, drenching night sweats, palpable lymph node swelling, mucositis, odynophagia, dysphagia, nausea vomiting, jaundice, chest pain, palpitation, shortness of breath, dyspnea on exertion, productive cough, gum bleeding, epistaxis, hematemesis, hemoptysis, abdominal pain, abdominal swelling, early satiety, melena, hematochezia, hematuria, skin rash, spontaneous bleeding, joint swelling, joint pain, heat or cold intolerance, bowel bladder incontinence, focal motor weakness, paresthesia, depression, suicidal or homocidal ideation, feeling hopelessness.   MEDICAL HISTORY: Past Medical History  Diagnosis Date  . Hypertension   . Coronary atherosclerosis of unspecified type of vessel, native or graft   . Cerebrovascular  disease, unspecified   . Pure hypercholesterolemia   . Diverticulosis of colon (without mention of hemorrhage)   . Benign neoplasm of colon   . Unspecified hemorrhoids without mention of complication   . Degenerative disc disease   . 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 Low back pain   . Hemangioma   . Amyloidosis   . LBBB (left bundle branch block)   . 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 OSA (obstructive sleep apnea)     SURGICAL HISTORY:  Past Surgical History  Procedure Date  . Knee arthroscopy   . Carpal tunnel release 08/2006    Dr Thad Ranger at [facility] - Bilateral  . 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 acyclovir (ZOVIRAX) 400 MG tablet Take 400 mg by mouth daily.       Marland Kitchen atenolol (TENORMIN) 100 MG tablet Take 1 tablet (100 mg total) by mouth daily.  90 tablet  3  . furosemide (LASIX) 40 MG tablet Take 40 mg by mouth 2 (two) times daily. Once or twice a day        No current facility-administered medications for this visit.   Facility-Administered Medications Ordered in Other Visits  Medication Dose Route Frequency Provider Last Rate Last Dose  . bortezomib SQ (VELCADE) chemo injection 3 mg  3 mg Subcutaneous Once Jethro Bolus, MD   3 mg at 12/23/11 1521  . ondansetron (ZOFRAN) tablet 8 mg  8 mg Oral Once Jethro Bolus, MD   8 mg at 12/23/11 1500    ALLERGIES:   has no known allergies.  REVIEW OF SYSTEMS:  The rest of the 14-point  review of system was negative.   Filed Vitals:   12/23/11 1408  BP: 125/70  Pulse: 66  Temp: 97 F (36.1 C)   Wt Readings from Last 3 Encounters:  12/23/11 271 lb 3.2 oz (123.016 kg)  11/25/11 268 lb 9.6 oz (121.836 kg)  09/30/11 266 lb 11.2 oz (120.974 kg)   ECOG Performance status: 1  PHYSICAL EXAMINATION:   General:  mildly obese man in no acute distress.  Eyes:  no scleral icterus.  ENT:  He had  an ulceration in the right oropharynx, however, I do not feel this is consistent with oral thrush, more in the line of ulcerations from the recent intubation. ENT:  There were no oropharyngeal lesions.  Neck was without thyromegaly.  Lymphatics:  Negative cervical, supraclavicular or axillary adenopathy.  Respiratory: lungs were clear bilaterally without wheezing or crackles.  Cardiovascular:  Regular rate and rhythm, S1/S2, without murmur, rub or gallop.  He has 1+ bilateral pedal edema to mid shin GI:  abdomen was soft, flat, nontender, nondistended, without organomegaly.  Muscoloskeletal:  no spinal tenderness of palpation of vertebral spine.  He had bilateral incisional scars around the lower lumbar area that are well-healed and intact without erythema or pain to palpation.Skin exam was without echymosis, petichae.  Neuro exam was nonfocal.  Patient was able to get on and off exam table without assistance.  Gait was normal.  Patient was alerted and oriented.  Attention was good.   Language was appropriate.  Mood was normal without depression.  Speech was not pressured.  Thought content was not tangential.     LABORATORY/RADIOLOGY DATA:  Lab Results  Component Value Date   WBC 9.0 12/23/2011   HGB 13.3 12/23/2011   HCT 40.9 12/23/2011   PLT 189 12/23/2011   GLUCOSE 79 12/16/2011   CHOL 155 01/15/2009   TRIG 77 01/15/2009   HDL 39.9 01/15/2009   LDLCALC 100* 01/15/2009   ALT 20 12/16/2011   AST 28 12/16/2011   NA 144 12/16/2011   K 4.8 12/16/2011   CL 107 12/16/2011   CREATININE 0.92 12/17/2011   BUN 17 12/16/2011   CO2 29 12/16/2011   TSH 1.167 01/17/2011   PSA 0.81 01/15/2009   INR 1.02 09/30/2011   INR 1.02 09/30/2011     ASSESSMENT AND PLAN:   1. History of amyloidosis:  He is doing very well.  His light chain has remained very stable.  He has no side effects of Velcade.  I recommended to continue with Velcade SQ once a week, 3 wks on, 1 wk off.  After 2 years of this regimen, in Oct 2013, we will readdress  whether to continue or stop.  He is on Acyclovir for prophy while on Velcade.  2. DOE:  Symptoms controlled with Lasix.  He is euvolumic on exam today.  3. Pedal edema.  Possibly secondary to venous stasis.  It's stable today without much symptoms. He was on BID of Lasix with improvement of his symptoms.  He wants to go back to once daily now.  I will continue to check his BMET once every 3-4 wks.  4. Hypertension.  Well controlled on atenolol and Lasix per Dr. Jens Som. 5. Chronic lower back pain:  recent operation with Ortho from [place] with significant improvement.  He is on pain meds sporadically per her report. 6. Follow up:  Weekly CBC and Velcade SQ.  We will check his light chain with the 1st week of each cycle.  Since he  is doing well, I will see him again in 8 weeks.    The length of time of the face-to-face encounter was 15 minutes. More than 50% of time was spent counseling and coordination of care.

## 2011-12-23 NOTE — Telephone Encounter (Signed)
appts made and printed for weekly lab inj and lab md on 3/7     aom

## 2011-12-23 NOTE — Patient Instructions (Signed)
Call md for problems.  D Tila Millirons rn 

## 2011-12-30 ENCOUNTER — Other Ambulatory Visit (HOSPITAL_BASED_OUTPATIENT_CLINIC_OR_DEPARTMENT_OTHER): Payer: Medicare Other | Admitting: Lab

## 2011-12-30 ENCOUNTER — Ambulatory Visit (HOSPITAL_BASED_OUTPATIENT_CLINIC_OR_DEPARTMENT_OTHER): Payer: Medicare Other

## 2011-12-30 VITALS — BP 122/65 | HR 77 | Temp 98.7°F

## 2011-12-30 DIAGNOSIS — E8589 Other amyloidosis: Secondary | ICD-10-CM

## 2011-12-30 DIAGNOSIS — E859 Amyloidosis, unspecified: Secondary | ICD-10-CM

## 2011-12-30 DIAGNOSIS — Z5112 Encounter for antineoplastic immunotherapy: Secondary | ICD-10-CM | POA: Diagnosis not present

## 2011-12-30 LAB — CBC WITH DIFFERENTIAL/PLATELET
BASO%: 0.5 % (ref 0.0–2.0)
Basophils Absolute: 0.1 10*3/uL (ref 0.0–0.1)
EOS%: 0.4 % (ref 0.0–7.0)
HCT: 42.1 % (ref 38.4–49.9)
HGB: 13.9 g/dL (ref 13.0–17.1)
MCH: 31 pg (ref 27.2–33.4)
MCHC: 33 g/dL (ref 32.0–36.0)
MCV: 93.8 fL (ref 79.3–98.0)
MONO%: 10.6 % (ref 0.0–14.0)
NEUT%: 77.4 % — ABNORMAL HIGH (ref 39.0–75.0)

## 2011-12-30 MED ORDER — ONDANSETRON HCL 8 MG PO TABS
8.0000 mg | ORAL_TABLET | Freq: Once | ORAL | Status: AC
  Administered 2011-12-30: 8 mg via ORAL

## 2011-12-30 MED ORDER — BORTEZOMIB CHEMO SQ INJECTION 3.5 MG (2.5MG/ML)
3.1000 mg | Freq: Once | INTRAMUSCULAR | Status: AC
  Administered 2011-12-30: 3 mg via SUBCUTANEOUS
  Filled 2011-12-30: qty 3

## 2011-12-30 NOTE — Patient Instructions (Signed)
Patient aware of next appointment; discharged home with no c/o pain. 

## 2012-01-06 ENCOUNTER — Ambulatory Visit (HOSPITAL_BASED_OUTPATIENT_CLINIC_OR_DEPARTMENT_OTHER): Payer: Medicare Other

## 2012-01-06 ENCOUNTER — Other Ambulatory Visit: Payer: Medicare Other

## 2012-01-06 VITALS — BP 156/92 | HR 52 | Temp 98.0°F

## 2012-01-06 DIAGNOSIS — E859 Amyloidosis, unspecified: Secondary | ICD-10-CM | POA: Diagnosis not present

## 2012-01-06 DIAGNOSIS — Z5112 Encounter for antineoplastic immunotherapy: Secondary | ICD-10-CM

## 2012-01-06 LAB — CBC WITH DIFFERENTIAL/PLATELET
BASO%: 0.3 % (ref 0.0–2.0)
EOS%: 0.4 % (ref 0.0–7.0)
LYMPH%: 11.3 % — ABNORMAL LOW (ref 14.0–49.0)
MCH: 30.8 pg (ref 27.2–33.4)
MCHC: 32.9 g/dL (ref 32.0–36.0)
MCV: 93.4 fL (ref 79.3–98.0)
MONO%: 8 % (ref 0.0–14.0)
Platelets: 182 10*3/uL (ref 140–400)
RBC: 4.26 10*6/uL (ref 4.20–5.82)
RDW: 13.8 % (ref 11.0–14.6)
nRBC: 0 % (ref 0–0)

## 2012-01-06 MED ORDER — BORTEZOMIB CHEMO SQ INJECTION 3.5 MG (2.5MG/ML)
3.1000 mg | Freq: Once | INTRAMUSCULAR | Status: AC
  Administered 2012-01-06: 3 mg via SUBCUTANEOUS
  Filled 2012-01-06: qty 3

## 2012-01-06 MED ORDER — ONDANSETRON HCL 8 MG PO TABS
8.0000 mg | ORAL_TABLET | Freq: Once | ORAL | Status: AC
  Administered 2012-01-06: 8 mg via ORAL

## 2012-01-19 DIAGNOSIS — M47817 Spondylosis without myelopathy or radiculopathy, lumbosacral region: Secondary | ICD-10-CM | POA: Diagnosis not present

## 2012-01-19 DIAGNOSIS — M48061 Spinal stenosis, lumbar region without neurogenic claudication: Secondary | ICD-10-CM | POA: Diagnosis not present

## 2012-01-19 DIAGNOSIS — M4310 Spondylolisthesis, site unspecified: Secondary | ICD-10-CM | POA: Diagnosis not present

## 2012-01-19 DIAGNOSIS — M5137 Other intervertebral disc degeneration, lumbosacral region: Secondary | ICD-10-CM | POA: Diagnosis not present

## 2012-01-20 ENCOUNTER — Ambulatory Visit (HOSPITAL_BASED_OUTPATIENT_CLINIC_OR_DEPARTMENT_OTHER): Payer: Medicare Other

## 2012-01-20 ENCOUNTER — Other Ambulatory Visit: Payer: Self-pay | Admitting: Oncology

## 2012-01-20 ENCOUNTER — Other Ambulatory Visit (HOSPITAL_BASED_OUTPATIENT_CLINIC_OR_DEPARTMENT_OTHER): Payer: Medicare Other | Admitting: Lab

## 2012-01-20 VITALS — BP 142/78 | HR 65 | Temp 98.0°F

## 2012-01-20 DIAGNOSIS — E859 Amyloidosis, unspecified: Secondary | ICD-10-CM

## 2012-01-20 DIAGNOSIS — Z5112 Encounter for antineoplastic immunotherapy: Secondary | ICD-10-CM

## 2012-01-20 LAB — CBC WITH DIFFERENTIAL/PLATELET
Basophils Absolute: 0 10*3/uL (ref 0.0–0.1)
EOS%: 1.1 % (ref 0.0–7.0)
HCT: 42.4 % (ref 38.4–49.9)
HGB: 13.9 g/dL (ref 13.0–17.1)
MCH: 30.9 pg (ref 27.2–33.4)
MCV: 94.3 fL (ref 79.3–98.0)
MONO%: 8.6 % (ref 0.0–14.0)
NEUT%: 81.1 % — ABNORMAL HIGH (ref 39.0–75.0)
Platelets: 178 10*3/uL (ref 140–400)

## 2012-01-20 LAB — COMPREHENSIVE METABOLIC PANEL
AST: 20 U/L (ref 0–37)
Alkaline Phosphatase: 100 U/L (ref 39–117)
BUN: 15 mg/dL (ref 6–23)
Calcium: 9 mg/dL (ref 8.4–10.5)
Creatinine, Ser: 0.85 mg/dL (ref 0.50–1.35)

## 2012-01-20 MED ORDER — BORTEZOMIB CHEMO SQ INJECTION 3.5 MG (2.5MG/ML)
3.1000 mg | Freq: Once | INTRAMUSCULAR | Status: AC
  Administered 2012-01-20: 3 mg via SUBCUTANEOUS
  Filled 2012-01-20: qty 1.2

## 2012-01-20 NOTE — Patient Instructions (Signed)
Call md for problems.  D Chamika Cunanan rn 

## 2012-01-21 LAB — KAPPA/LAMBDA LIGHT CHAINS
Kappa:Lambda Ratio: 2.64 — ABNORMAL HIGH (ref 0.26–1.65)
Lambda Free Lght Chn: 1.34 mg/dL (ref 0.57–2.63)

## 2012-01-22 ENCOUNTER — Other Ambulatory Visit: Payer: Self-pay | Admitting: Oncology

## 2012-01-27 ENCOUNTER — Other Ambulatory Visit: Payer: Medicare Other | Admitting: Lab

## 2012-01-27 ENCOUNTER — Ambulatory Visit: Payer: Medicare Other

## 2012-01-28 ENCOUNTER — Other Ambulatory Visit: Payer: Self-pay | Admitting: Oncology

## 2012-01-28 ENCOUNTER — Telehealth: Payer: Self-pay | Admitting: *Deleted

## 2012-01-28 ENCOUNTER — Other Ambulatory Visit: Payer: Medicare Other

## 2012-01-28 ENCOUNTER — Ambulatory Visit (HOSPITAL_BASED_OUTPATIENT_CLINIC_OR_DEPARTMENT_OTHER): Payer: Medicare Other

## 2012-01-28 ENCOUNTER — Ambulatory Visit: Payer: Medicare Other | Admitting: Lab

## 2012-01-28 VITALS — BP 145/86 | HR 77 | Temp 98.6°F

## 2012-01-28 DIAGNOSIS — Z5112 Encounter for antineoplastic immunotherapy: Secondary | ICD-10-CM | POA: Diagnosis not present

## 2012-01-28 DIAGNOSIS — E859 Amyloidosis, unspecified: Secondary | ICD-10-CM

## 2012-01-28 LAB — CBC WITH DIFFERENTIAL/PLATELET
Basophils Absolute: 0 10*3/uL (ref 0.0–0.1)
Eosinophils Absolute: 0 10*3/uL (ref 0.0–0.5)
HGB: 14.6 g/dL (ref 13.0–17.1)
MCV: 91.8 fL (ref 79.3–98.0)
MONO#: 0.8 10*3/uL (ref 0.1–0.9)
MONO%: 10.2 % (ref 0.0–14.0)
NEUT#: 6.2 10*3/uL (ref 1.5–6.5)
RBC: 4.77 10*6/uL (ref 4.20–5.82)
RDW: 13.3 % (ref 11.0–14.6)
WBC: 8.2 10*3/uL (ref 4.0–10.3)
lymph#: 1.1 10*3/uL (ref 0.9–3.3)

## 2012-01-28 MED ORDER — ONDANSETRON HCL 8 MG PO TABS
8.0000 mg | ORAL_TABLET | Freq: Once | ORAL | Status: AC
Start: 2012-01-28 — End: 2012-01-28
  Administered 2012-01-28: 8 mg via ORAL

## 2012-01-28 MED ORDER — BORTEZOMIB CHEMO SQ INJECTION 3.5 MG (2.5MG/ML)
3.1000 mg | Freq: Once | INTRAMUSCULAR | Status: AC
  Administered 2012-01-28: 3 mg via SUBCUTANEOUS
  Filled 2012-01-28: qty 1.2

## 2012-01-28 MED ORDER — ONDANSETRON HCL 8 MG PO TABS: 8.0000 mg | ORAL_TABLET | Freq: Once | ORAL | Status: DC

## 2012-01-28 NOTE — Patient Instructions (Signed)
Patient aware of next appointment; discharged home with no complaints. 

## 2012-02-03 ENCOUNTER — Other Ambulatory Visit: Payer: Medicare Other | Admitting: Lab

## 2012-02-03 ENCOUNTER — Ambulatory Visit (HOSPITAL_BASED_OUTPATIENT_CLINIC_OR_DEPARTMENT_OTHER): Payer: Medicare Other

## 2012-02-03 VITALS — BP 156/72 | HR 57 | Temp 97.1°F

## 2012-02-03 DIAGNOSIS — E859 Amyloidosis, unspecified: Secondary | ICD-10-CM | POA: Diagnosis not present

## 2012-02-03 DIAGNOSIS — Z5112 Encounter for antineoplastic immunotherapy: Secondary | ICD-10-CM

## 2012-02-03 LAB — CBC WITH DIFFERENTIAL/PLATELET
BASO%: 0.4 % (ref 0.0–2.0)
HCT: 45.9 % (ref 38.4–49.9)
HGB: 15.1 g/dL (ref 13.0–17.1)
MCHC: 32.9 g/dL (ref 32.0–36.0)
MONO#: 1.1 10*3/uL — ABNORMAL HIGH (ref 0.1–0.9)
NEUT%: 71.1 % (ref 39.0–75.0)
RDW: 13.5 % (ref 11.0–14.6)
WBC: 8.9 10*3/uL (ref 4.0–10.3)
lymph#: 1.4 10*3/uL (ref 0.9–3.3)

## 2012-02-03 MED ORDER — BORTEZOMIB CHEMO SQ INJECTION 3.5 MG (2.5MG/ML)
3.1000 mg | Freq: Once | INTRAMUSCULAR | Status: AC
  Administered 2012-02-03: 3 mg via SUBCUTANEOUS
  Filled 2012-02-03: qty 3

## 2012-02-03 MED ORDER — ONDANSETRON HCL 8 MG PO TABS
8.0000 mg | ORAL_TABLET | Freq: Once | ORAL | Status: AC
  Administered 2012-02-03: 8 mg via ORAL

## 2012-02-03 NOTE — Patient Instructions (Signed)
[facility] Discharge Instructions for Patients Receiving Chemotherapy  Today you received the following chemotherapy agents Velcade.  To help prevent nausea and vomiting after your treatment, we encourage you to take your nausea medication as prescribed by your physician.  If you develop nausea and vomiting that is not controlled by your nausea medication, call the clinic. If it is after clinic hours your family physician or the after hours number for the clinic or go to the Emergency Department.   BELOW ARE SYMPTOMS THAT SHOULD BE REPORTED IMMEDIATELY:  *FEVER GREATER THAN 100.5 F  *CHILLS WITH OR WITHOUT FEVER  NAUSEA AND VOMITING THAT IS NOT CONTROLLED WITH YOUR NAUSEA MEDICATION  *UNUSUAL SHORTNESS OF BREATH  *UNUSUAL BRUISING OR BLEEDING  TENDERNESS IN MOUTH AND THROAT WITH OR WITHOUT PRESENCE OF ULCERS  *URINARY PROBLEMS  *BOWEL PROBLEMS  UNUSUAL RASH Items with * indicate a potential emergency and should be followed up as soon as possible.  Feel free to call the clinic you have any questions or concerns. The clinic phone number is [phone].   I have been informed and understand all the instructions given to me. I know to contact the clinic, my physician, or go to the Emergency Department if any problems should occur. I do not have any questions at this time, but understand that I may call the clinic during office hours   should I have any questions or need assistance in obtaining follow up care.    __________________________________________  _____________  __________ Signature of Patient or Authorized Representative            Date                   Time    __________________________________________ Nurse's Signature

## 2012-02-17 ENCOUNTER — Ambulatory Visit (HOSPITAL_BASED_OUTPATIENT_CLINIC_OR_DEPARTMENT_OTHER): Payer: Medicare Other

## 2012-02-17 ENCOUNTER — Telehealth: Payer: Self-pay | Admitting: Oncology

## 2012-02-17 ENCOUNTER — Other Ambulatory Visit (HOSPITAL_BASED_OUTPATIENT_CLINIC_OR_DEPARTMENT_OTHER): Payer: Medicare Other

## 2012-02-17 ENCOUNTER — Ambulatory Visit (HOSPITAL_BASED_OUTPATIENT_CLINIC_OR_DEPARTMENT_OTHER): Payer: Medicare Other | Admitting: Oncology

## 2012-02-17 VITALS — BP 143/69 | HR 61 | Temp 97.2°F | Ht 71.0 in | Wt 263.8 lb

## 2012-02-17 DIAGNOSIS — R609 Edema, unspecified: Secondary | ICD-10-CM

## 2012-02-17 DIAGNOSIS — R0989 Other specified symptoms and signs involving the circulatory and respiratory systems: Secondary | ICD-10-CM | POA: Diagnosis not present

## 2012-02-17 DIAGNOSIS — E859 Amyloidosis, unspecified: Secondary | ICD-10-CM

## 2012-02-17 DIAGNOSIS — Z5112 Encounter for antineoplastic immunotherapy: Secondary | ICD-10-CM

## 2012-02-17 DIAGNOSIS — R0609 Other forms of dyspnea: Secondary | ICD-10-CM | POA: Diagnosis not present

## 2012-02-17 LAB — CBC WITH DIFFERENTIAL/PLATELET
Basophils Absolute: 0 10*3/uL (ref 0.0–0.1)
Eosinophils Absolute: 0 10*3/uL (ref 0.0–0.5)
HCT: 42.7 % (ref 38.4–49.9)
HGB: 14.1 g/dL (ref 13.0–17.1)
LYMPH%: 10.2 % — ABNORMAL LOW (ref 14.0–49.0)
MONO#: 0.9 10*3/uL (ref 0.1–0.9)
NEUT#: 7 10*3/uL — ABNORMAL HIGH (ref 1.5–6.5)
Platelets: 185 10*3/uL (ref 140–400)
RBC: 4.56 10*6/uL (ref 4.20–5.82)
WBC: 8.8 10*3/uL (ref 4.0–10.3)

## 2012-02-17 MED ORDER — ONDANSETRON HCL 8 MG PO TABS
8.0000 mg | ORAL_TABLET | Freq: Once | ORAL | Status: AC
  Administered 2012-02-17: 8 mg via ORAL

## 2012-02-17 MED ORDER — BORTEZOMIB CHEMO SQ INJECTION 3.5 MG (2.5MG/ML)
3.1000 mg | Freq: Once | INTRAMUSCULAR | Status: AC
  Administered 2012-02-17: 3 mg via SUBCUTANEOUS
  Filled 2012-02-17: qty 3

## 2012-02-17 NOTE — Progress Notes (Signed)
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  Negative cytogenetics and FISH myeloma panel.  Cardiac MRI showed no sign of cardiac dysfunction.  PAST TREATMENT: Velcade, dexamethasone, melphalan x 3 cycles.  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  CURRENT THERAPY:  started salvage Velcade for persistent serum free light chain on 09/17/2010 (3 weeks on, 1 week off).  INTERVAL HISTORY: Johnny Mitchell 71 y.o. male returns for regular follow up.  He reports feeling well.  He has been using his CPAP machine more regularly for OSA.  He noticed that his SOB, DOE have improved significantly.  He has been tolerating Velcade well without major problem such as skin rash, nausea vomiting, cytopenia, infection. He still has slight bilateral pedal edema for which he takes Lasix as needed. His strength is good and he is still working full-time without major fatigue.  Patient denies fatigue, headache, visual changes, confusion, drenching night sweats, palpable lymph node swelling, mucositis, odynophagia, dysphagia, nausea vomiting, jaundice, chest pain, productive cough, gum bleeding, epistaxis, hematemesis, hemoptysis, abdominal pain, abdominal swelling, early satiety, melena, hematochezia, hematuria, skin rash, spontaneous bleeding, joint swelling, joint pain, heat or cold intolerance, bowel bladder incontinence, back pain, focal motor weakness, paresthesia, depression, suicidal or homocidal ideation, feeling hopelessness.  MEDICAL HISTORY: Past Medical History  Diagnosis Date  . Hypertension   . Coronary atherosclerosis of unspecified type of vessel, native or graft   . Cerebrovascular disease, unspecified   . Pure hypercholesterolemia   . Diverticulosis of colon (without  mention of hemorrhage)   . Benign neoplasm of colon   . Unspecified hemorrhoids without mention of complication   . Degenerative disc disease   . 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 Low back pain   . Hemangioma   . Amyloidosis   . LBBB (left bundle branch block)   . 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 OSA (obstructive sleep apnea)     SURGICAL HISTORY:  Past Surgical History  Procedure Date  . Knee arthroscopy   . Carpal tunnel release 08/2006    Dr Thad Ranger at [facility] - Bilateral  . 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 acyclovir (ZOVIRAX) 400 MG tablet Take 400 mg by mouth daily.       Marland Kitchen atenolol (TENORMIN) 100 MG tablet Take 1 tablet (100 mg total) by mouth daily.  90 tablet  3  . furosemide (LASIX) 40 MG tablet Take 40 mg by mouth 2 (two) times daily. Once or twice a day        No current facility-administered medications for this visit.   Facility-Administered Medications Ordered in Other Visits  Medication Dose Route Frequency Provider Last Rate Last Dose  . bortezomib SQ (VELCADE) chemo injection 3 mg  3 mg Subcutaneous Once Jethro Bolus, MD   3 mg at 02/17/12 1114  . ondansetron (ZOFRAN) tablet 8 mg  8 mg Oral Once Jethro Bolus, MD   8 mg at 02/17/12 1055    ALLERGIES:   has no known allergies.  REVIEW OF SYSTEMS:  The rest of the 14-point review of system was negative.   Filed Vitals:   02/17/12 1027  BP: 143/69  Pulse: 61  Temp: 97.2 F (36.2 C)   Wt Readings from Last 3 Encounters:  02/17/12 263 lb 12.8 oz (119.659 kg)  12/23/11 271 lb 3.2 oz (123.016 kg)  11/25/11 268 lb 9.6 oz (121.836 kg)   ECOG Performance status: 1  PHYSICAL EXAMINATION:   General:  mildly obese man in no acute distress.  Eyes:  no scleral icterus.  ENT:  He had an ulceration in the right oropharynx, however, I do not feel this is consistent with  oral thrush, more in the line of ulcerations from the recent intubation. ENT:  There were no oropharyngeal lesions.  Neck was without thyromegaly.  Lymphatics:  Negative cervical, supraclavicular or axillary adenopathy.  Respiratory: lungs were clear bilaterally without wheezing or crackles.  Cardiovascular:  Regular rate and rhythm, S1/S2, without murmur, rub or gallop.  He has 1+ bilateral pedal edema to mid shin GI:  abdomen was soft, flat, nontender, nondistended, without organomegaly.  Muscoloskeletal:  no spinal tenderness of palpation of vertebral spine.  He had bilateral incisional scars around the lower lumbar area that are well-healed and intact without erythema or pain to palpation.Skin exam was without echymosis, petichae.  Neuro exam was nonfocal.  Patient was able to get on and off exam table without assistance.  Gait was normal.  Patient was alerted and oriented.  Attention was good.   Language was appropriate.  Mood was normal without depression.  Speech was not pressured.  Thought content was not tangential.     LABORATORY/RADIOLOGY DATA:  Lab Results  Component Value Date   WBC 8.8 02/17/2012   HGB 14.1 02/17/2012   HCT 42.7 02/17/2012   PLT 185 02/17/2012   GLUCOSE 102* 01/20/2012   CHOL 155 01/15/2009   TRIG 77 01/15/2009   HDL 39.9 01/15/2009   LDLCALC 100* 01/15/2009   ALT 15 01/20/2012   AST 20 01/20/2012   NA 139 01/20/2012   K 3.8 01/20/2012   CL 103 01/20/2012   CREATININE 0.85 01/20/2012   BUN 15 01/20/2012   CO2 30 01/20/2012   TSH 1.167 01/17/2011   PSA 0.81 01/15/2009   INR 1.02 09/30/2011   INR 1.02 09/30/2011     ASSESSMENT AND PLAN:   1. History of amyloidosis:  He has no side effects of Velcade.  There is no sign of disease recurrence.  His serum free light chain has been stable.  I recommended to continue with Velcade SQ once a week, 3 wks on, 1 wk off.  As he is doing well, there is no indication for dose modification. After 2 years of this regimen, in Oct 2013, we will readdress  whether to continue or stop.  He is on Acyclovir for prophy while on Velcade.  2. DOE:  Symptoms controlled with Lasix.  He is euvolumic on exam today.  3. Pedal edema.  Possibly secondary to venous stasis vs right sided heart failure from OSA.   It's stable today without much symptoms. He is on daily Lasix prn.  4. Hypertension.  Well controlled on atenolol and Lasix per Dr. Jens Som. 5. Chronic lower back pain:  recent operation with Ortho from [place] with significant improvement.  He is on oxycone qday prn.  6. Follow up:  Weekly CBC and Velcade SQ (3wks on, 1 wk off).  I'll see in two cycles from on on 04/13/2012.   The length of time of the face-to-face encounter was 15 minutes. More than 50% of time was spent counseling and  coordination of care.

## 2012-02-17 NOTE — Telephone Encounter (Signed)
called pt and informed him of appt on 0314. pt will pick scheduled for march-may on 03/14

## 2012-02-18 LAB — COMPREHENSIVE METABOLIC PANEL
Albumin: 3.8 g/dL (ref 3.5–5.2)
BUN: 22 mg/dL (ref 6–23)
CO2: 28 mEq/L (ref 19–32)
Glucose, Bld: 97 mg/dL (ref 70–99)
Potassium: 4.4 mEq/L (ref 3.5–5.3)
Sodium: 142 mEq/L (ref 135–145)
Total Bilirubin: 0.5 mg/dL (ref 0.3–1.2)
Total Protein: 6.3 g/dL (ref 6.0–8.3)

## 2012-02-18 LAB — KAPPA/LAMBDA LIGHT CHAINS
Kappa free light chain: 5.28 mg/dL — ABNORMAL HIGH (ref 0.33–1.94)
Lambda Free Lght Chn: 1.95 mg/dL (ref 0.57–2.63)

## 2012-02-24 ENCOUNTER — Ambulatory Visit (HOSPITAL_BASED_OUTPATIENT_CLINIC_OR_DEPARTMENT_OTHER): Payer: Medicare Other

## 2012-02-24 ENCOUNTER — Other Ambulatory Visit (HOSPITAL_BASED_OUTPATIENT_CLINIC_OR_DEPARTMENT_OTHER): Payer: Medicare Other

## 2012-02-24 VITALS — BP 135/78 | HR 110 | Temp 98.4°F

## 2012-02-24 DIAGNOSIS — E8589 Other amyloidosis: Secondary | ICD-10-CM

## 2012-02-24 DIAGNOSIS — Z5112 Encounter for antineoplastic immunotherapy: Secondary | ICD-10-CM

## 2012-02-24 DIAGNOSIS — E859 Amyloidosis, unspecified: Secondary | ICD-10-CM

## 2012-02-24 LAB — CBC WITH DIFFERENTIAL/PLATELET
BASO%: 0.3 % (ref 0.0–2.0)
MCHC: 33.3 g/dL (ref 32.0–36.0)
MONO#: 0.7 10*3/uL (ref 0.1–0.9)
NEUT#: 7.1 10*3/uL — ABNORMAL HIGH (ref 1.5–6.5)
RBC: 4.4 10*6/uL (ref 4.20–5.82)
WBC: 9.1 10*3/uL (ref 4.0–10.3)
lymph#: 1.1 10*3/uL (ref 0.9–3.3)
nRBC: 0 % (ref 0–0)

## 2012-02-24 MED ORDER — BORTEZOMIB CHEMO SQ INJECTION 3.5 MG (2.5MG/ML)
3.1000 mg | Freq: Once | INTRAMUSCULAR | Status: AC
  Administered 2012-02-24: 3 mg via SUBCUTANEOUS
  Filled 2012-02-24: qty 3

## 2012-02-24 MED ORDER — ONDANSETRON HCL 8 MG PO TABS
8.0000 mg | ORAL_TABLET | Freq: Once | ORAL | Status: AC
  Administered 2012-02-24: 8 mg via ORAL

## 2012-02-24 NOTE — Patient Instructions (Signed)
[facility] Discharge Instructions for Patients Receiving Chemotherapy  Today you received the following chemotherapy agents Velcade.   To help prevent nausea and vomiting after your treatment, we encourage you to take your nausea medication as prescribed by your physician.  If you develop nausea and vomiting that is not controlled by your nausea medication, call the clinic. If it is after clinic hours your family physician or the after hours number for the clinic or go to the Emergency Department.   BELOW ARE SYMPTOMS THAT SHOULD BE REPORTED IMMEDIATELY:  *FEVER GREATER THAN 100.5 F  *CHILLS WITH OR WITHOUT FEVER  NAUSEA AND VOMITING THAT IS NOT CONTROLLED WITH YOUR NAUSEA MEDICATION  *UNUSUAL SHORTNESS OF BREATH  *UNUSUAL BRUISING OR BLEEDING  TENDERNESS IN MOUTH AND THROAT WITH OR WITHOUT PRESENCE OF ULCERS  *URINARY PROBLEMS  *BOWEL PROBLEMS  UNUSUAL RASH Items with * indicate a potential emergency and should be followed up as soon as possible.  Feel free to call the clinic you have any questions or concerns. The clinic phone number is [phone].   I have been informed and understand all the instructions given to me. I know to contact the clinic, my physician, or go to the Emergency Department if any problems should occur. I do not have any questions at this time, but understand that I may call the clinic during office hours   should I have any questions or need assistance in obtaining follow up care.    __________________________________________  _____________  __________ Signature of Patient or Authorized Representative            Date                   Time    __________________________________________ Nurse's Signature    

## 2012-03-02 ENCOUNTER — Ambulatory Visit (HOSPITAL_BASED_OUTPATIENT_CLINIC_OR_DEPARTMENT_OTHER): Payer: Medicare Other

## 2012-03-02 ENCOUNTER — Other Ambulatory Visit (HOSPITAL_BASED_OUTPATIENT_CLINIC_OR_DEPARTMENT_OTHER): Payer: Medicare Other | Admitting: Lab

## 2012-03-02 VITALS — BP 150/88 | HR 88 | Temp 97.3°F

## 2012-03-02 DIAGNOSIS — Z5112 Encounter for antineoplastic immunotherapy: Secondary | ICD-10-CM

## 2012-03-02 DIAGNOSIS — E859 Amyloidosis, unspecified: Secondary | ICD-10-CM | POA: Diagnosis not present

## 2012-03-02 LAB — CBC WITH DIFFERENTIAL/PLATELET
Basophils Absolute: 0 10*3/uL (ref 0.0–0.1)
EOS%: 0.5 % (ref 0.0–7.0)
Eosinophils Absolute: 0 10*3/uL (ref 0.0–0.5)
HGB: 14.4 g/dL (ref 13.0–17.1)
MONO#: 1.1 10*3/uL — ABNORMAL HIGH (ref 0.1–0.9)
NEUT#: 6.2 10*3/uL (ref 1.5–6.5)
RDW: 14.2 % (ref 11.0–14.6)
WBC: 8.6 10*3/uL (ref 4.0–10.3)
lymph#: 1.3 10*3/uL (ref 0.9–3.3)

## 2012-03-02 MED ORDER — ONDANSETRON HCL 8 MG PO TABS
8.0000 mg | ORAL_TABLET | Freq: Once | ORAL | Status: AC
  Administered 2012-03-02: 8 mg via ORAL

## 2012-03-02 MED ORDER — BORTEZOMIB CHEMO SQ INJECTION 3.5 MG (2.5MG/ML)
3.1000 mg | Freq: Once | INTRAMUSCULAR | Status: AC
  Administered 2012-03-02: 3 mg via SUBCUTANEOUS
  Filled 2012-03-02: qty 3

## 2012-03-15 DIAGNOSIS — I079 Rheumatic tricuspid valve disease, unspecified: Secondary | ICD-10-CM | POA: Diagnosis not present

## 2012-03-15 DIAGNOSIS — I10 Essential (primary) hypertension: Secondary | ICD-10-CM | POA: Diagnosis not present

## 2012-03-15 DIAGNOSIS — E8589 Other amyloidosis: Secondary | ICD-10-CM | POA: Diagnosis not present

## 2012-03-15 DIAGNOSIS — Z9481 Bone marrow transplant status: Secondary | ICD-10-CM | POA: Diagnosis not present

## 2012-03-15 DIAGNOSIS — E859 Amyloidosis, unspecified: Secondary | ICD-10-CM | POA: Diagnosis not present

## 2012-03-15 DIAGNOSIS — I517 Cardiomegaly: Secondary | ICD-10-CM | POA: Diagnosis not present

## 2012-03-15 DIAGNOSIS — Z9484 Stem cells transplant status: Secondary | ICD-10-CM | POA: Diagnosis not present

## 2012-03-15 DIAGNOSIS — Z79899 Other long term (current) drug therapy: Secondary | ICD-10-CM | POA: Diagnosis not present

## 2012-03-15 DIAGNOSIS — E878 Other disorders of electrolyte and fluid balance, not elsewhere classified: Secondary | ICD-10-CM | POA: Diagnosis not present

## 2012-03-15 DIAGNOSIS — Z0189 Encounter for other specified special examinations: Secondary | ICD-10-CM | POA: Diagnosis not present

## 2012-03-16 ENCOUNTER — Ambulatory Visit: Payer: Medicare Other

## 2012-03-16 ENCOUNTER — Other Ambulatory Visit: Payer: Medicare Other | Admitting: Lab

## 2012-03-17 ENCOUNTER — Ambulatory Visit (HOSPITAL_BASED_OUTPATIENT_CLINIC_OR_DEPARTMENT_OTHER): Payer: Medicare Other

## 2012-03-17 ENCOUNTER — Other Ambulatory Visit: Payer: Self-pay | Admitting: Oncology

## 2012-03-17 VITALS — BP 134/81 | HR 69 | Temp 98.3°F

## 2012-03-17 DIAGNOSIS — E859 Amyloidosis, unspecified: Secondary | ICD-10-CM | POA: Diagnosis not present

## 2012-03-17 DIAGNOSIS — Z5112 Encounter for antineoplastic immunotherapy: Secondary | ICD-10-CM | POA: Diagnosis not present

## 2012-03-17 LAB — CBC WITH DIFFERENTIAL/PLATELET
Basophils Absolute: 0 10*3/uL (ref 0.0–0.1)
EOS%: 0.2 % (ref 0.0–7.0)
Eosinophils Absolute: 0 10*3/uL (ref 0.0–0.5)
HGB: 14.3 g/dL (ref 13.0–17.1)
LYMPH%: 11.9 % — ABNORMAL LOW (ref 14.0–49.0)
MCH: 30.9 pg (ref 27.2–33.4)
MCV: 92.9 fL (ref 79.3–98.0)
MONO%: 9.2 % (ref 0.0–14.0)
NEUT#: 7.6 10*3/uL — ABNORMAL HIGH (ref 1.5–6.5)
Platelets: 192 10*3/uL (ref 140–400)
RBC: 4.63 10*6/uL (ref 4.20–5.82)

## 2012-03-17 MED ORDER — ONDANSETRON HCL 8 MG PO TABS
8.0000 mg | ORAL_TABLET | Freq: Once | ORAL | Status: AC
  Administered 2012-03-17: 8 mg via ORAL

## 2012-03-17 MED ORDER — BORTEZOMIB CHEMO SQ INJECTION 3.5 MG (2.5MG/ML)
3.1000 mg | Freq: Once | INTRAMUSCULAR | Status: AC
  Administered 2012-03-17: 3 mg via SUBCUTANEOUS
  Filled 2012-03-17: qty 3

## 2012-03-22 DIAGNOSIS — Z9484 Stem cells transplant status: Secondary | ICD-10-CM | POA: Diagnosis not present

## 2012-03-22 DIAGNOSIS — Z9481 Bone marrow transplant status: Secondary | ICD-10-CM | POA: Diagnosis not present

## 2012-03-22 DIAGNOSIS — Z79899 Other long term (current) drug therapy: Secondary | ICD-10-CM | POA: Diagnosis not present

## 2012-03-22 DIAGNOSIS — I10 Essential (primary) hypertension: Secondary | ICD-10-CM | POA: Diagnosis not present

## 2012-03-22 DIAGNOSIS — E8589 Other amyloidosis: Secondary | ICD-10-CM | POA: Diagnosis not present

## 2012-03-22 DIAGNOSIS — E859 Amyloidosis, unspecified: Secondary | ICD-10-CM | POA: Diagnosis not present

## 2012-03-23 ENCOUNTER — Ambulatory Visit: Payer: Medicare Other | Admitting: Oncology

## 2012-03-23 ENCOUNTER — Other Ambulatory Visit (HOSPITAL_BASED_OUTPATIENT_CLINIC_OR_DEPARTMENT_OTHER): Payer: Medicare Other

## 2012-03-23 ENCOUNTER — Other Ambulatory Visit: Payer: Self-pay | Admitting: *Deleted

## 2012-03-23 ENCOUNTER — Other Ambulatory Visit: Payer: Self-pay | Admitting: Oncology

## 2012-03-23 ENCOUNTER — Ambulatory Visit (HOSPITAL_BASED_OUTPATIENT_CLINIC_OR_DEPARTMENT_OTHER): Payer: Medicare Other

## 2012-03-23 DIAGNOSIS — Z5111 Encounter for antineoplastic chemotherapy: Secondary | ICD-10-CM | POA: Diagnosis not present

## 2012-03-23 DIAGNOSIS — E859 Amyloidosis, unspecified: Secondary | ICD-10-CM

## 2012-03-23 LAB — CBC WITH DIFFERENTIAL/PLATELET
BASO%: 0.3 % (ref 0.0–2.0)
EOS%: 0.5 % (ref 0.0–7.0)
LYMPH%: 10.7 % — ABNORMAL LOW (ref 14.0–49.0)
MCH: 30.9 pg (ref 27.2–33.4)
MCHC: 33.2 g/dL (ref 32.0–36.0)
MCV: 93 fL (ref 79.3–98.0)
MONO%: 11.4 % (ref 0.0–14.0)
Platelets: 165 10*3/uL (ref 140–400)
RBC: 4.57 10*6/uL (ref 4.20–5.82)

## 2012-03-23 MED ORDER — DEXAMETHASONE 2 MG PO TABS: 20.0000 mg | ORAL_TABLET | ORAL | Status: DC

## 2012-03-23 MED ORDER — LENALIDOMIDE 5 MG PO CAPS: 5.0000 mg | ORAL_CAPSULE | Freq: Every day | ORAL | Status: DC

## 2012-03-23 MED ORDER — DEXAMETHASONE 2 MG PO TABS: 10.0000 mg | ORAL_TABLET | ORAL | Status: AC

## 2012-03-23 MED ORDER — ONDANSETRON HCL 8 MG PO TABS
8.0000 mg | ORAL_TABLET | Freq: Once | ORAL | Status: AC
  Administered 2012-03-23: 8 mg via ORAL

## 2012-03-23 MED ORDER — DEXAMETHASONE 4 MG PO TABS: 2.0000 mg | ORAL_TABLET | Freq: Two times a day (BID) | ORAL | Status: DC

## 2012-03-23 MED ORDER — BORTEZOMIB CHEMO SQ INJECTION 3.5 MG (2.5MG/ML)
3.1000 mg | Freq: Once | INTRAMUSCULAR | Status: AC
  Administered 2012-03-23: 3 mg via SUBCUTANEOUS
  Filled 2012-03-23: qty 3

## 2012-03-23 NOTE — Progress Notes (Signed)
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  Negative cytogenetics and FISH myeloma panel.  Cardiac MRI showed no sign of cardiac dysfunction.  PAST TREATMENT: Velcade, dexamethasone, melphalan x 3 cycles.  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  CURRENT THERAPY:  started salvage Velcade for persistent serum free light chain on 09/17/2010 (3 weeks on, 1 week off).  INTERVAL HISTORY: Johnny Mitchell 71 y.o. male returns for regular follow up.  He had follow up with Dr. Barbaraann Boys, [facility] yesterday.  He noticed that his tongue is getting bigger and he feels full in the mouth.  His other manifestations of amyloidosis are stable such as SOB, DOE, pedal edema.  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  He still has right lower back pain for OA and he takes Oxycodone prn.   With respect to being of Velcade, he denies fatigue, neuropathy, skin rash, fever, infection.  Patient denies fatigue, headache, visual changes, confusion, drenching night sweats, palpable lymph node swelling, mucositis, odynophagia, dysphagia, nausea vomiting, jaundice, chest pain, palpitation, productive cough, gum bleeding, epistaxis, hematemesis, hemoptysis, abdominal pain, abdominal swelling, early satiety, melena, hematochezia, hematuria, skin rash, spontaneous bleeding, joint swelling, joint pain, heat or cold intolerance, bowel bladder incontinence, back pain, focal motor weakness, paresthesia, depression, suicidal or homocidal ideation, feeling hopelessness.    MEDICAL HISTORY: Past Medical History  Diagnosis Date  . Hypertension   . Coronary atherosclerosis of unspecified type of vessel, native or graft   . Cerebrovascular disease, unspecified   . Pure  hypercholesterolemia   . Diverticulosis of colon (without mention of hemorrhage)   . Benign neoplasm of colon   . Unspecified hemorrhoids without mention of complication   . Degenerative disc disease   . 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 Low back pain   . Hemangioma   . Amyloidosis   . LBBB (left bundle branch block)   . 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 OSA (obstructive sleep apnea)     SURGICAL HISTORY:  Past Surgical History  Procedure Date  . Knee arthroscopy   . Carpal tunnel release 08/2006    Dr Thad Ranger at [facility] - Bilateral  . 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 acyclovir (ZOVIRAX) 400 MG tablet Take 400 mg by mouth daily.       Marland Kitchen atenolol (TENORMIN) 100 MG tablet Take 1 tablet (100 mg total) by mouth daily.  90 tablet  3  . dexamethasone (DECADRON) 2 MG tablet Take 5 tablets (10 mg total) by mouth once a week.  100 tablet  6  . furosemide (LASIX) 40 MG tablet Take 40 mg by mouth 2 (two) times daily. Once or twice a day       . lenalidomide (REVLIMID) 5 MG capsule Take 1 capsule (5 mg total) by mouth daily.  21 capsule  0  . DISCONTD: furosemide (LASIX) 40 MG tablet Take 1 tablet (40 mg total) by mouth 2 (two) times daily.  60 tablet  3   No current facility-administered medications for this visit.   Facility-Administered Medications Ordered in Other Visits  Medication Dose Route Frequency Provider  Last Rate Last Dose  . bortezomib SQ (VELCADE) chemo injection 3 mg  3 mg Subcutaneous Once Exie Parody, MD   3 mg at 03/23/12 1313  . ondansetron (ZOFRAN) tablet 8 mg  8 mg Oral Once Exie Parody, MD   8 mg at 03/23/12 1255    ALLERGIES:   has no known allergies.  REVIEW OF SYSTEMS:  The rest of the 14-point review of system was negative.   Filed Vitals:   03/23/12 1249  BP: 148/79  Pulse: 63  Temp: 97.8 F (36.6 C)   Wt  Readings from Last 3 Encounters:  03/23/12 259 lb (117.482 kg)  02/17/12 263 lb 12.8 oz (119.659 kg)  12/23/11 271 lb 3.2 oz (123.016 kg)   ECOG Performance status: 1  PHYSICAL EXAMINATION:   General:  mildly obese man in no acute distress.  Eyes:  no scleral icterus. He had bruises in his bilateral upper eyelids.  ENT: no dental indentation on the tongue.   ENT:  There were no oropharyngeal lesions.  Neck was without thyromegaly.  Lymphatics:  Negative cervical, supraclavicular or axillary adenopathy.  Respiratory: lungs were clear bilaterally without wheezing or crackles.  Cardiovascular:  Regular rate and rhythm, S1/S2, without murmur, rub or gallop.  He has 1+ bilateral pedal edema to mid shin GI:  abdomen was soft, flat, nontender, nondistended, without organomegaly.  Muscoloskeletal:  no spinal tenderness of palpation of vertebral spine.  Skin exam was without echymosis, petichae.  Neuro exam was nonfocal.  Patient was able to get on and off exam table without assistance.  Gait was normal.  Patient was alerted and oriented.  Attention was good.   Language was appropriate.  Mood was normal without depression.  Speech was not pressured.  Thought content was not tangential.     LABORATORY/RADIOLOGY DATA:  Lab Results  Component Value Date   WBC 7.8 03/23/2012   HGB 14.1 03/23/2012   HCT 42.5 03/23/2012   PLT 165 03/23/2012   GLUCOSE 97 02/17/2012   CHOL 155 01/15/2009   TRIG 77 01/15/2009   HDL 39.9 01/15/2009   LDLCALC 100* 01/15/2009   ALT 16 02/17/2012   AST 20 02/17/2012   NA 142 02/17/2012   K 4.4 02/17/2012   CL 106 02/17/2012   CREATININE 0.92 02/17/2012   BUN 22 02/17/2012   CO2 28 02/17/2012   TSH 1.167 01/17/2011   PSA 0.81 01/15/2009   INR 1.02 09/30/2011   INR 1.02 09/30/2011     ASSESSMENT AND PLAN:   1. History of amyloidosis:  He still has positive light chain in the serum and 24 hour urine collection.  He now has subjective fullness of his tongue.  I agreed with Dr. Lillia Abed  recommendation to adding low dose Revlimid and Dexamethasone to his current regimen of Velcade.  I discussed with patient that there are phase II data supporting the use of Revlimid in amyloidosis and this triple regimen is very commonly used in myeloma which is a plasma cell disorder related to amyloidosis.  Side effects of Velcade/Revlimid/Dexamethasone include but not limited to alopecia, fatigue, GERD, insomnia, mucositis, cytopenia, bleeding, infection, thrombosis, skin rash, neuropathy, secondary cancer, electrolyte abnormality, constipation, diarrhea, birth defects.  Per Dr. Lillia Abed recommendation, Revlimid will be 5mg  PO daily d1-21 with 1 week off; Velcade will be continued SQ at current dose weekly, 3 weeks on, 1 week off.  He has not had much problem with Velcade.  Dexamethasone will be 10mg  PO weekly even during  the week off of chemo.  Mr. Lindner expressed informed understanding and wished to proceed with stated recommendations.  We spent time to go over the Revlimid REMS forms in detail and he electronically signed it.  I will delay starting Revlimd until 04/13/2012 to synchronize it with Velcade which will be started with the next cycle on that date.  I advised him to go ahead with weekly Dexamethasone as soon as he has it filled.  We discussed different options for DVT prophy.  He does not want to be on Coumadin or Lovenox.  As the Revlimid dose is low, and he does not have very high M-spike burden, I agreed with his wish to be on ASA 81mg  PO daily for now.  I educated him on ssx of DVT, PE.  He is still on acylcovir prophy.  If he develops prolonged leukopenia, I will add on PCP prophy with Bactrim.  2. DOE:  Symptoms controlled with Lasix.  He is euvolumic on exam today.  3. Pedal edema.  Possibly secondary to venous stasis vs right sided heart failure from OSA.   It's stable today without much symptoms. He is on daily Lasix prn.  4. Hypertension.  Well controlled on atenolol and Lasix per Dr.  Jens Som. 5. Chronic lower back pain:  recent operation with Ortho from [place] with significant improvement.  He is on oxycone qday prn.  6. Follow up:  Weekly CBC and Velcade SQ (3wks on, 1 wk off).  I'll see in with next cycle on 04/13/2012 to see if he has any last minutes questions before starting Revlimid.     The length of time of the face-to-face encounter was 25 minutes. More than 50% of time was spent counseling and coordination of care.

## 2012-03-23 NOTE — Progress Notes (Signed)
Pt enrolled in [facility] for new Revlimid Rx per Dr. Gaylyn Rong.    Survey completed and Berkley Harvey #[id number].   Rx given to Thelma Barge in [facility] Dept for prior Auth..  Rx to Start on Apr 13, 2012 w/ next cycle of Velcade.

## 2012-03-29 DIAGNOSIS — M47817 Spondylosis without myelopathy or radiculopathy, lumbosacral region: Secondary | ICD-10-CM | POA: Diagnosis not present

## 2012-03-29 DIAGNOSIS — M5137 Other intervertebral disc degeneration, lumbosacral region: Secondary | ICD-10-CM | POA: Diagnosis not present

## 2012-03-29 DIAGNOSIS — M48061 Spinal stenosis, lumbar region without neurogenic claudication: Secondary | ICD-10-CM | POA: Diagnosis not present

## 2012-03-29 DIAGNOSIS — M4310 Spondylolisthesis, site unspecified: Secondary | ICD-10-CM | POA: Diagnosis not present

## 2012-03-30 ENCOUNTER — Ambulatory Visit (HOSPITAL_BASED_OUTPATIENT_CLINIC_OR_DEPARTMENT_OTHER): Payer: Medicare Other

## 2012-03-30 ENCOUNTER — Other Ambulatory Visit (HOSPITAL_BASED_OUTPATIENT_CLINIC_OR_DEPARTMENT_OTHER): Payer: Medicare Other | Admitting: Lab

## 2012-03-30 VITALS — BP 138/80 | HR 67 | Temp 97.5°F

## 2012-03-30 DIAGNOSIS — E859 Amyloidosis, unspecified: Secondary | ICD-10-CM | POA: Diagnosis not present

## 2012-03-30 DIAGNOSIS — Z5112 Encounter for antineoplastic immunotherapy: Secondary | ICD-10-CM

## 2012-03-30 LAB — CBC WITH DIFFERENTIAL/PLATELET
BASO%: 0.1 % (ref 0.0–2.0)
EOS%: 0 % (ref 0.0–7.0)
HCT: 45.3 % (ref 38.4–49.9)
LYMPH%: 6 % — ABNORMAL LOW (ref 14.0–49.0)
MCH: 30.6 pg (ref 27.2–33.4)
MCHC: 33.1 g/dL (ref 32.0–36.0)
NEUT%: 93.1 % — ABNORMAL HIGH (ref 39.0–75.0)
Platelets: 183 10*3/uL (ref 140–400)
RBC: 4.9 10*6/uL (ref 4.20–5.82)
nRBC: 0 % (ref 0–0)

## 2012-03-30 MED ORDER — BORTEZOMIB CHEMO SQ INJECTION 3.5 MG (2.5MG/ML)
3.1000 mg | Freq: Once | INTRAMUSCULAR | Status: AC
Start: 2012-03-30 — End: 2012-03-30
  Administered 2012-03-30: 3 mg via SUBCUTANEOUS
  Filled 2012-03-30: qty 3

## 2012-03-30 MED ORDER — ONDANSETRON HCL 8 MG PO TABS
8.0000 mg | ORAL_TABLET | Freq: Once | ORAL | Status: AC
  Administered 2012-03-30: 8 mg via ORAL

## 2012-03-30 NOTE — Patient Instructions (Signed)
[facility] Discharge Instructions for Patients Receiving Chemotherapy  Today you received the following chemotherapy agents Velcade To help prevent nausea and vomiting after your treatment, we encourage you to take your nausea medication Begin taking it at 7pm and take it as often as prescribed for the next 24-72 hours.   If you develop nausea and vomiting that is not controlled by your nausea medication, call the clinic. If it is after clinic hours your family physician or the after hours number for the clinic or go to the Emergency Department.   BELOW ARE SYMPTOMS THAT SHOULD BE REPORTED IMMEDIATELY:  *FEVER GREATER THAN 100.5 F  *CHILLS WITH OR WITHOUT FEVER  NAUSEA AND VOMITING THAT IS NOT CONTROLLED WITH YOUR NAUSEA MEDICATION  *UNUSUAL SHORTNESS OF BREATH  *UNUSUAL BRUISING OR BLEEDING  TENDERNESS IN MOUTH AND THROAT WITH OR WITHOUT PRESENCE OF ULCERS  *URINARY PROBLEMS  *BOWEL PROBLEMS  UNUSUAL RASH Items with * indicate a potential emergency and should be followed up as soon as possible.  One of the nurses will contact you 24 hours after your treatment. Please let the nurse know about any problems that you may have experienced. Feel free to call the clinic you have any questions or concerns. The clinic phone number is [phone].   I have been informed and understand all the instructions given to me. I know to contact the clinic, my physician, or go to the Emergency Department if any problems should occur. I do not have any questions at this time, but understand that I may call the clinic during office hours   should I have any questions or need assistance in obtaining follow up care.    __________________________________________  _____________  __________ Signature of Patient or Authorized Representative            Date                   Time    __________________________________________ Nurse's Signature    

## 2012-04-05 ENCOUNTER — Encounter: Payer: Self-pay | Admitting: Oncology

## 2012-04-05 NOTE — Progress Notes (Addendum)
03/24/12 FAXED INFO TO DEBBY KARL @ [facility].  SHE SAID THAT HIS DX IS NOT COVERED AND HE WILL HAVE TO COMPLETE THE APPLICATION FOR PATIENT ASSISTANCE.    04/05/12 HE BROUGHT HIS FORM AND HIS TAX INFO IN AND BEVERLY SENT IT TO DEBBY.  I SENT IN THE DR'S SECTION.  DEBBY WILL CALL BACK AS SOON AS SHE KNOWS ANYTHING.  04/06/12 CHANGED HIS AVERAGE MONTHLY GROSS INCOME TO REFLECT HIS WIFE'S SOCIAL SECURITY.  04/07/12 HE QUALIFIES FOR FREE REVLIMID DUE TO HIS DX.  I FAXED THE RX TO [facility] [phone]. THEIR PHONE NUMBER IS [phone].  04/10/12 FAX FROM [facility] STATING THEY RECEIVED TH RX.

## 2012-04-11 ENCOUNTER — Encounter: Payer: Self-pay | Admitting: *Deleted

## 2012-04-11 NOTE — Progress Notes (Signed)
RECEIVED A FAX FROM [facility] CONCERNING A CONFIRMATION OF PRESCRIPTION SHIPMENT FOR REVLIMID. 

## 2012-04-13 ENCOUNTER — Ambulatory Visit (HOSPITAL_BASED_OUTPATIENT_CLINIC_OR_DEPARTMENT_OTHER): Payer: Medicare Other | Admitting: Oncology

## 2012-04-13 ENCOUNTER — Ambulatory Visit (HOSPITAL_BASED_OUTPATIENT_CLINIC_OR_DEPARTMENT_OTHER): Payer: Medicare Other

## 2012-04-13 ENCOUNTER — Telehealth: Payer: Self-pay | Admitting: Oncology

## 2012-04-13 ENCOUNTER — Encounter: Payer: Self-pay | Admitting: Oncology

## 2012-04-13 ENCOUNTER — Other Ambulatory Visit (HOSPITAL_BASED_OUTPATIENT_CLINIC_OR_DEPARTMENT_OTHER): Payer: Medicare Other | Admitting: Lab

## 2012-04-13 ENCOUNTER — Telehealth: Payer: Self-pay | Admitting: *Deleted

## 2012-04-13 ENCOUNTER — Ambulatory Visit: Payer: Medicare Other

## 2012-04-13 VITALS — BP 118/53 | HR 66 | Temp 97.9°F | Ht 71.0 in | Wt 265.9 lb

## 2012-04-13 DIAGNOSIS — I10 Essential (primary) hypertension: Secondary | ICD-10-CM | POA: Diagnosis not present

## 2012-04-13 DIAGNOSIS — E859 Amyloidosis, unspecified: Secondary | ICD-10-CM | POA: Diagnosis not present

## 2012-04-13 DIAGNOSIS — Z5112 Encounter for antineoplastic immunotherapy: Secondary | ICD-10-CM

## 2012-04-13 DIAGNOSIS — G589 Mononeuropathy, unspecified: Secondary | ICD-10-CM | POA: Diagnosis not present

## 2012-04-13 LAB — CBC WITH DIFFERENTIAL/PLATELET
BASO%: 0.3 % (ref 0.0–2.0)
Basophils Absolute: 0 10*3/uL (ref 0.0–0.1)
EOS%: 0 % (ref 0.0–7.0)
MCH: 31.4 pg (ref 27.2–33.4)
MCHC: 33.2 g/dL (ref 32.0–36.0)
MCV: 94.7 fL (ref 79.3–98.0)
MONO%: 0.6 % (ref 0.0–14.0)
RBC: 4.55 10*6/uL (ref 4.20–5.82)
RDW: 14.5 % (ref 11.0–14.6)
nRBC: 0 % (ref 0–0)

## 2012-04-13 MED ORDER — BORTEZOMIB CHEMO SQ INJECTION 3.5 MG (2.5MG/ML)
3.1000 mg | Freq: Once | INTRAMUSCULAR | Status: AC
  Administered 2012-04-13: 3 mg via SUBCUTANEOUS
  Filled 2012-04-13: qty 3

## 2012-04-13 MED ORDER — ONDANSETRON HCL 8 MG PO TABS
8.0000 mg | ORAL_TABLET | Freq: Once | ORAL | Status: AC
  Administered 2012-04-13: 8 mg via ORAL

## 2012-04-13 NOTE — Telephone Encounter (Signed)
sent email to michele to scheduled chemo for 05/30-06/06

## 2012-04-13 NOTE — Patient Instructions (Signed)
[facility] Discharge Instructions for Patients Receiving Chemotherapy  Today you received the following chemotherapy agents Velcade  To help prevent nausea and vomiting after your treatment, we encourage you to take your nausea medication. Begin taking it at 7pm and take it as often as prescribed for the next 24-48 hours.   If you develop nausea and vomiting that is not controlled by your nausea medication, call the clinic. If it is after clinic hours your family physician or the after hours number for the clinic or go to the Emergency Department.   BELOW ARE SYMPTOMS THAT SHOULD BE REPORTED IMMEDIATELY:  *FEVER GREATER THAN 100.5 F  *CHILLS WITH OR WITHOUT FEVER  NAUSEA AND VOMITING THAT IS NOT CONTROLLED WITH YOUR NAUSEA MEDICATION  *UNUSUAL SHORTNESS OF BREATH  *UNUSUAL BRUISING OR BLEEDING  TENDERNESS IN MOUTH AND THROAT WITH OR WITHOUT PRESENCE OF ULCERS  *URINARY PROBLEMS  *BOWEL PROBLEMS  UNUSUAL RASH Items with * indicate a potential emergency and should be followed up as soon as possible.  One of the nurses will contact you 24 hours after your treatment. Please let the nurse know about any problems that you may have experienced. Feel free to call the clinic you have any questions or concerns. The clinic phone number is [phone].   I have been informed and understand all the instructions given to me. I know to contact the clinic, my physician, or go to the Emergency Department if any problems should occur. I do not have any questions at this time, but understand that I may call the clinic during office hours   should I have any questions or need assistance in obtaining follow up care.    __________________________________________  _____________  __________ Signature of Patient or Authorized Representative            Date                   Time    __________________________________________ Nurse's Signature    

## 2012-04-13 NOTE — Progress Notes (Signed)
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  Negative cytogenetics and FISH myeloma panel.  Cardiac MRI showed no sign of cardiac dysfunction.  PAST TREATMENT: Velcade, dexamethasone, melphalan x 3 cycles.  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 He started salvage Velcade for persistent serum free light chain on 09/17/2010 (3 weeks on, 1 week off).  CURRENT THERAPY: Here to begin cycle 1 day 1 of Velcade, Dexamethasone, and Revlimid   INTERVAL HISTORY: Johnny Mitchell 71 y.o. male returns for regular follow up by himself. He is here to begin his first cycle of Velcade, Dexamethasone, and Revlimid today. At his last visit, he noticed that his tongue is getting bigger and he feels full in the mouth.  His other manifestations of amyloidosis are stable such as SOB, DOE, pedal edema.  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  He reports that his back pain is improved. He denies fatigue, neuropathy, skin rash, fever, infection. He is taking his Acyclovir as directed. He has not yet started an 81 mg ASA as reccommended at his last visit  Patient denies fatigue, headache, visual changes, confusion, drenching night sweats, palpable lymph node swelling, mucositis, odynophagia, dysphagia, nausea vomiting, jaundice, chest pain, palpitation, productive cough, gum bleeding, epistaxis, hematemesis, hemoptysis, abdominal pain, abdominal swelling, early satiety, melena, hematochezia, hematuria, skin rash, spontaneous bleeding, joint swelling, joint pain, heat or cold intolerance, bowel bladder incontinence, back pain, focal motor weakness, paresthesia, depression, suicidal or homocidal ideation, feeling hopelessness.    MEDICAL HISTORY: Past Medical History    Diagnosis Date  . Hypertension   . Coronary atherosclerosis of unspecified type of vessel, native or graft   . Cerebrovascular disease, unspecified   . Pure hypercholesterolemia   . Diverticulosis of colon (without mention of hemorrhage)   . Benign neoplasm of colon   . Unspecified hemorrhoids without mention of complication   . Degenerative disc disease   . 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 Low back pain   . Hemangioma   . Amyloidosis   . LBBB (left bundle branch block)   . 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 OSA (obstructive sleep apnea)     SURGICAL HISTORY:  Past Surgical History  Procedure Date  . Knee arthroscopy   . Carpal tunnel release 08/2006    Dr Thad Ranger at [facility] - Bilateral  . 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 acyclovir (ZOVIRAX) 400 MG tablet Take 400 mg by mouth daily.       Marland Kitchen aspirin 81 MG tablet Take 81 mg by mouth daily.      Marland Kitchen atenolol (TENORMIN) 100 MG tablet Take 1 tablet (100 mg total) by mouth daily.  90 tablet  3  . dexamethasone (DECADRON) 2 MG tablet Take 10 mg by mouth once a week.      . furosemide (LASIX) 40 MG tablet Take 40 mg by mouth 2 (two) times daily. Once or twice a day       . lenalidomide (REVLIMID) 5 MG capsule Take 1 capsule (5 mg total) by mouth daily.  21 capsule  0  . DISCONTD: furosemide (LASIX) 40  MG tablet Take 1 tablet (40 mg total) by mouth 2 (two) times daily.  60 tablet  3   No current facility-administered medications for this visit.   Facility-Administered Medications Ordered in Other Visits  Medication Dose Route Frequency Provider Last Rate Last Dose  . bortezomib SQ (VELCADE) chemo injection 3 mg  3 mg Subcutaneous Once Exie Parody, MD   3 mg at 04/13/12 1227  . ondansetron (ZOFRAN) tablet 8 mg  8 mg Oral Once Exie Parody, MD   8 mg at 04/13/12 1203    ALLERGIES:   has  no known allergies.  REVIEW OF SYSTEMS:  The rest of the 14-point review of system was negative.   Filed Vitals:   04/13/12 1115  BP: 118/53  Pulse: 66  Temp: 97.9 F (36.6 C)   Wt Readings from Last 3 Encounters:  04/13/12 265 lb 14.4 oz (120.611 kg)  03/23/12 259 lb (117.482 kg)  02/17/12 263 lb 12.8 oz (119.659 kg)   ECOG Performance status: 1  PHYSICAL EXAMINATION:   General:  mildly obese man in no acute distress.  Eyes:  no scleral icterus. He had bruises in his bilateral upper eyelids.  ENT: no dental indentation on the tongue.   ENT:  There were no oropharyngeal lesions.  Neck was without thyromegaly.  Lymphatics:  Negative cervical, supraclavicular or axillary adenopathy.  Respiratory: lungs were clear bilaterally without wheezing or crackles.  Cardiovascular:  Regular rate and rhythm, S1/S2, without murmur, rub or gallop.  He has 1+ bilateral pedal edema to mid shin GI:  abdomen was soft, flat, nontender, nondistended, without organomegaly.  Muscoloskeletal:  no spinal tenderness of palpation of vertebral spine.  Skin exam was without echymosis, petichae.  Neuro exam was nonfocal.  Patient was able to get on and off exam table without assistance.  Gait was normal.  Patient was alerted and oriented.  Attention was good.   Language was appropriate.  Mood was normal without depression.  Speech was not pressured.  Thought content was not tangential.     LABORATORY/RADIOLOGY DATA:  Lab Results  Component Value Date   WBC 14.1* 04/13/2012   HGB 14.3 04/13/2012   HCT 43.1 04/13/2012   PLT 174 04/13/2012   GLUCOSE 97 02/17/2012   CHOL 155 01/15/2009   TRIG 77 01/15/2009   HDL 39.9 01/15/2009   LDLCALC 100* 01/15/2009   ALT 16 02/17/2012   AST 20 02/17/2012   NA 142 02/17/2012   K 4.4 02/17/2012   CL 106 02/17/2012   CREATININE 0.92 02/17/2012   BUN 22 02/17/2012   CO2 28 02/17/2012   TSH 1.167 01/17/2011   PSA 0.81 01/15/2009   INR 1.02 09/30/2011   INR 1.02 09/30/2011     ASSESSMENT AND PLAN:    1. History of amyloidosis:  He still has positive light chain in the serum and 24 hour urine collection with subjective fullness of his tongue. Dr. Lillia Abed recommended addition of adding low dose Revlimid and Dexamethasone to his current regimen of Velcade.  Dr Gaylyn Rong has discussed with the patient at his prior visit that there are phase II data supporting the use of Revlimid in amyloidosis and this triple regimen is very commonly used in myeloma which is a plasma cell disorder related to amyloidosis.  Side effects of Velcade/Revlimid/Dexamethasone include but not limited to alopecia, fatigue, GERD, insomnia, mucositis, cytopenia, bleeding, infection, thrombosis, skin rash, neuropathy, secondary cancer, electrolyte abnormality, constipation, diarrhea, birth defects.  Per Dr. Lillia Abed recommendation, Revlimid  will be 5mg  PO daily d1-21 with 1 week off; Velcade will be continued SQ at current dose weekly, 3 weeks on, 1 week off.  Dexamethasone will be 10mg  PO weekly even during the week off of chemo.  Mr. Mault expressed informed understanding and wished to proceed with stated recommendations.  The patient took his first dose of Revlimid this morning. He is still on acylcovir prophy.  If he develops prolonged leukopenia, I will add on PCP prophy with Bactrim.  2. DVT prophylaxis:  As the Revlimid dose is low, and he does not have very high M-spike burden. The patient expressed desire to take ASA 81 mg daily. I have again reminded him to begin this medication. I have reviewed the ssx of DVT, PE.  DOE:  Symptoms controlled with Lasix.  He is euvolumic on exam today.  3. Pedal edema.  Possibly secondary to venous stasis vs right sided heart failure from OSA.   It's stable today without much symptoms. He is on daily Lasix prn.  4. Hypertension.  Well controlled on atenolol and Lasix per Dr. Jens Som. 5. Chronic lower back pain:  recent operation with Ortho from [place] with significant improvement.  He is on  oxycone qday prn. 6. Follow up:  Weekly CBC, BMET and Velcade SQ (3wks on, 1 wk off). The patient will have a CBC, CMET, SPEP, and light chains checked on 05/04/12 per his request (1 week prior to next cycle of Velcade).  The patient was seen and examined with Dr Gaylyn Rong. >90% of plan of care developed by Dr Gaylyn Rong. The length of time of the face-to-face encounter was 15 minutes. More than 50% of time was spent counseling and coordination of care.

## 2012-04-13 NOTE — Telephone Encounter (Signed)
Per staff message, I have scheduled the patient for treatments. Scheduler aware.  JMW

## 2012-04-13 NOTE — Patient Instructions (Signed)
Take a baby aspirin (81 mg) every day.   Continue Velcade weekly x 3 weeks (one week off). Continue Revlimid 5 mg daily for 21 days then 7 days off. Continue Dexamethasone 10 mg weekly.

## 2012-04-13 NOTE — Telephone Encounter (Signed)
called pt and provided appt for 05/09 informed pt to pick up may and june appts at that time

## 2012-04-17 LAB — COMPREHENSIVE METABOLIC PANEL
AST: 23 U/L (ref 0–37)
Alkaline Phosphatase: 77 U/L (ref 39–117)
Glucose, Bld: 142 mg/dL — ABNORMAL HIGH (ref 70–99)
Sodium: 140 mEq/L (ref 135–145)
Total Bilirubin: 0.6 mg/dL (ref 0.3–1.2)
Total Protein: 6.3 g/dL (ref 6.0–8.3)

## 2012-04-17 LAB — KAPPA/LAMBDA LIGHT CHAINS: Kappa:Lambda Ratio: 3.7 — ABNORMAL HIGH (ref 0.26–1.65)

## 2012-04-20 ENCOUNTER — Other Ambulatory Visit (HOSPITAL_BASED_OUTPATIENT_CLINIC_OR_DEPARTMENT_OTHER): Payer: Medicare Other

## 2012-04-20 ENCOUNTER — Ambulatory Visit (HOSPITAL_BASED_OUTPATIENT_CLINIC_OR_DEPARTMENT_OTHER): Payer: Medicare Other

## 2012-04-20 VITALS — BP 147/89 | HR 85 | Temp 97.5°F

## 2012-04-20 DIAGNOSIS — Z5112 Encounter for antineoplastic immunotherapy: Secondary | ICD-10-CM

## 2012-04-20 DIAGNOSIS — E859 Amyloidosis, unspecified: Secondary | ICD-10-CM

## 2012-04-20 LAB — CBC WITH DIFFERENTIAL/PLATELET
Eosinophils Absolute: 0 10*3/uL (ref 0.0–0.5)
MONO#: 0.1 10*3/uL (ref 0.1–0.9)
NEUT#: 11.5 10*3/uL — ABNORMAL HIGH (ref 1.5–6.5)
Platelets: 158 10*3/uL (ref 140–400)
RBC: 4.63 10*6/uL (ref 4.20–5.82)
RDW: 14.7 % — ABNORMAL HIGH (ref 11.0–14.6)
WBC: 12.2 10*3/uL — ABNORMAL HIGH (ref 4.0–10.3)
lymph#: 0.5 10*3/uL — ABNORMAL LOW (ref 0.9–3.3)
nRBC: 0 % (ref 0–0)

## 2012-04-20 LAB — BASIC METABOLIC PANEL
CO2: 25 mEq/L (ref 19–32)
Chloride: 104 mEq/L (ref 96–112)
Sodium: 139 mEq/L (ref 135–145)

## 2012-04-20 MED ORDER — BORTEZOMIB CHEMO SQ INJECTION 3.5 MG (2.5MG/ML)
3.1000 mg | Freq: Once | INTRAMUSCULAR | Status: AC
  Administered 2012-04-20: 3 mg via SUBCUTANEOUS
  Filled 2012-04-20: qty 3

## 2012-04-20 MED ORDER — ONDANSETRON HCL 8 MG PO TABS
8.0000 mg | ORAL_TABLET | Freq: Once | ORAL | Status: AC
  Administered 2012-04-20: 8 mg via ORAL

## 2012-04-27 ENCOUNTER — Other Ambulatory Visit (HOSPITAL_BASED_OUTPATIENT_CLINIC_OR_DEPARTMENT_OTHER): Payer: Medicare Other | Admitting: Lab

## 2012-04-27 ENCOUNTER — Ambulatory Visit (HOSPITAL_BASED_OUTPATIENT_CLINIC_OR_DEPARTMENT_OTHER): Payer: Medicare Other

## 2012-04-27 VITALS — BP 135/77 | HR 68 | Temp 97.8°F

## 2012-04-27 DIAGNOSIS — E859 Amyloidosis, unspecified: Secondary | ICD-10-CM | POA: Diagnosis not present

## 2012-04-27 DIAGNOSIS — Z5112 Encounter for antineoplastic immunotherapy: Secondary | ICD-10-CM | POA: Diagnosis not present

## 2012-04-27 LAB — CBC WITH DIFFERENTIAL/PLATELET
Basophils Absolute: 0 10*3/uL (ref 0.0–0.1)
EOS%: 0 % (ref 0.0–7.0)
Eosinophils Absolute: 0 10*3/uL (ref 0.0–0.5)
HCT: 43.7 % (ref 38.4–49.9)
HGB: 14.4 g/dL (ref 13.0–17.1)
MCH: 31.8 pg (ref 27.2–33.4)
MCV: 96.1 fL (ref 79.3–98.0)
MONO%: 1.3 % (ref 0.0–14.0)
NEUT#: 11.2 10*3/uL — ABNORMAL HIGH (ref 1.5–6.5)
NEUT%: 93.4 % — ABNORMAL HIGH (ref 39.0–75.0)
lymph#: 0.6 10*3/uL — ABNORMAL LOW (ref 0.9–3.3)

## 2012-04-27 LAB — BASIC METABOLIC PANEL
Calcium: 8.6 mg/dL (ref 8.4–10.5)
Creatinine, Ser: 1.3 mg/dL (ref 0.50–1.35)
Sodium: 138 mEq/L (ref 135–145)

## 2012-04-27 MED ORDER — BORTEZOMIB CHEMO SQ INJECTION 3.5 MG (2.5MG/ML)
3.1000 mg | Freq: Once | INTRAMUSCULAR | Status: AC
  Administered 2012-04-27: 3 mg via SUBCUTANEOUS
  Filled 2012-04-27: qty 3

## 2012-04-27 MED ORDER — ONDANSETRON HCL 8 MG PO TABS
8.0000 mg | ORAL_TABLET | Freq: Once | ORAL | Status: AC
  Administered 2012-04-27: 8 mg via ORAL

## 2012-04-27 NOTE — Patient Instructions (Signed)
[facility] Discharge Instructions for Patients Receiving Chemotherapy  Today you received the following chemotherapy agents Velcade  To help prevent nausea and vomiting after your treatment, we encourage you to take your nausea medication. Begin taking it at 7pm and take it as often as prescribed for the next 24-48 hours.   If you develop nausea and vomiting that is not controlled by your nausea medication, call the clinic. If it is after clinic hours your family physician or the after hours number for the clinic or go to the Emergency Department.   BELOW ARE SYMPTOMS THAT SHOULD BE REPORTED IMMEDIATELY:  *FEVER GREATER THAN 100.5 F  *CHILLS WITH OR WITHOUT FEVER  NAUSEA AND VOMITING THAT IS NOT CONTROLLED WITH YOUR NAUSEA MEDICATION  *UNUSUAL SHORTNESS OF BREATH  *UNUSUAL BRUISING OR BLEEDING  TENDERNESS IN MOUTH AND THROAT WITH OR WITHOUT PRESENCE OF ULCERS  *URINARY PROBLEMS  *BOWEL PROBLEMS  UNUSUAL RASH Items with * indicate a potential emergency and should be followed up as soon as possible.  One of the nurses will contact you 24 hours after your treatment. Please let the nurse know about any problems that you may have experienced. Feel free to call the clinic you have any questions or concerns. The clinic phone number is [phone].   I have been informed and understand all the instructions given to me. I know to contact the clinic, my physician, or go to the Emergency Department if any problems should occur. I do not have any questions at this time, but understand that I may call the clinic during office hours   should I have any questions or need assistance in obtaining follow up care.    __________________________________________  _____________  __________ Signature of Patient or Authorized Representative            Date                   Time    __________________________________________ Nurse's Signature    

## 2012-05-01 ENCOUNTER — Other Ambulatory Visit: Payer: Self-pay | Admitting: Certified Registered Nurse Anesthetist

## 2012-05-02 DIAGNOSIS — Z79899 Other long term (current) drug therapy: Secondary | ICD-10-CM | POA: Diagnosis not present

## 2012-05-02 DIAGNOSIS — E859 Amyloidosis, unspecified: Secondary | ICD-10-CM | POA: Diagnosis not present

## 2012-05-02 DIAGNOSIS — I10 Essential (primary) hypertension: Secondary | ICD-10-CM | POA: Diagnosis not present

## 2012-05-02 DIAGNOSIS — Z9484 Stem cells transplant status: Secondary | ICD-10-CM | POA: Diagnosis not present

## 2012-05-02 DIAGNOSIS — E878 Other disorders of electrolyte and fluid balance, not elsewhere classified: Secondary | ICD-10-CM | POA: Diagnosis not present

## 2012-05-02 DIAGNOSIS — Z9481 Bone marrow transplant status: Secondary | ICD-10-CM | POA: Diagnosis not present

## 2012-05-02 DIAGNOSIS — E8589 Other amyloidosis: Secondary | ICD-10-CM | POA: Diagnosis not present

## 2012-05-04 ENCOUNTER — Other Ambulatory Visit: Payer: Self-pay | Admitting: Oncology

## 2012-05-04 ENCOUNTER — Other Ambulatory Visit (HOSPITAL_BASED_OUTPATIENT_CLINIC_OR_DEPARTMENT_OTHER): Payer: Medicare Other | Admitting: Lab

## 2012-05-04 DIAGNOSIS — E859 Amyloidosis, unspecified: Secondary | ICD-10-CM

## 2012-05-04 DIAGNOSIS — Z5112 Encounter for antineoplastic immunotherapy: Secondary | ICD-10-CM | POA: Diagnosis not present

## 2012-05-04 LAB — CBC WITH DIFFERENTIAL/PLATELET
Basophils Absolute: 0 10*3/uL (ref 0.0–0.1)
EOS%: 0.5 % (ref 0.0–7.0)
Eosinophils Absolute: 0 10*3/uL (ref 0.0–0.5)
HGB: 14 g/dL (ref 13.0–17.1)
MCH: 31.4 pg (ref 27.2–33.4)
MONO#: 0.9 10*3/uL (ref 0.1–0.9)
NEUT#: 4.3 10*3/uL (ref 1.5–6.5)
RDW: 14.6 % (ref 11.0–14.6)
WBC: 6.1 10*3/uL (ref 4.0–10.3)
lymph#: 0.8 10*3/uL — ABNORMAL LOW (ref 0.9–3.3)

## 2012-05-05 ENCOUNTER — Other Ambulatory Visit: Payer: Self-pay | Admitting: *Deleted

## 2012-05-05 ENCOUNTER — Encounter: Payer: Self-pay | Admitting: *Deleted

## 2012-05-05 MED ORDER — LENALIDOMIDE 5 MG PO CAPS: 5.0000 mg | ORAL_CAPSULE | Freq: Every day | ORAL | Status: DC

## 2012-05-05 NOTE — Progress Notes (Signed)
Fax notification from [employer] that referral has been received for Revlimid through the [employer] patient support program.

## 2012-05-09 LAB — COMPREHENSIVE METABOLIC PANEL
Albumin: 3.6 g/dL (ref 3.5–5.2)
Alkaline Phosphatase: 84 U/L (ref 39–117)
Chloride: 108 mEq/L (ref 96–112)
Glucose, Bld: 96 mg/dL (ref 70–99)
Potassium: 4.3 mEq/L (ref 3.5–5.3)
Sodium: 144 mEq/L (ref 135–145)
Total Protein: 5.7 g/dL — ABNORMAL LOW (ref 6.0–8.3)

## 2012-05-09 LAB — SPEP & IFE WITH QIG
Albumin ELP: 57.9 % (ref 55.8–66.1)
Alpha-1-Globulin: 4.2 % (ref 2.9–4.9)
IgM, Serum: 26 mg/dL — ABNORMAL LOW (ref 41–251)

## 2012-05-09 LAB — KAPPA/LAMBDA LIGHT CHAINS: Lambda Free Lght Chn: 1.21 mg/dL (ref 0.57–2.63)

## 2012-05-10 NOTE — Telephone Encounter (Signed)
[employer] afxed confirmation of Revlimid prescription shipment.  Revlimid shipped on 05-09-12 with next business day delivery.

## 2012-05-11 ENCOUNTER — Other Ambulatory Visit: Payer: Medicare Other | Admitting: Lab

## 2012-05-11 ENCOUNTER — Ambulatory Visit (HOSPITAL_BASED_OUTPATIENT_CLINIC_OR_DEPARTMENT_OTHER): Payer: Medicare Other | Admitting: Oncology

## 2012-05-11 ENCOUNTER — Encounter: Payer: Self-pay | Admitting: Oncology

## 2012-05-11 ENCOUNTER — Ambulatory Visit (HOSPITAL_BASED_OUTPATIENT_CLINIC_OR_DEPARTMENT_OTHER): Payer: Medicare Other

## 2012-05-11 ENCOUNTER — Telehealth: Payer: Self-pay | Admitting: Internal Medicine

## 2012-05-11 VITALS — BP 140/74 | HR 72 | Temp 98.1°F | Ht 71.0 in | Wt 269.0 lb

## 2012-05-11 DIAGNOSIS — Z5112 Encounter for antineoplastic immunotherapy: Secondary | ICD-10-CM | POA: Diagnosis not present

## 2012-05-11 DIAGNOSIS — E859 Amyloidosis, unspecified: Secondary | ICD-10-CM

## 2012-05-11 DIAGNOSIS — R600 Localized edema: Secondary | ICD-10-CM

## 2012-05-11 DIAGNOSIS — M545 Low back pain: Secondary | ICD-10-CM | POA: Diagnosis not present

## 2012-05-11 DIAGNOSIS — I10 Essential (primary) hypertension: Secondary | ICD-10-CM | POA: Diagnosis not present

## 2012-05-11 DIAGNOSIS — R609 Edema, unspecified: Secondary | ICD-10-CM | POA: Diagnosis not present

## 2012-05-11 MED ORDER — DEXAMETHASONE 2 MG PO TABS: 6.0000 mg | ORAL_TABLET | ORAL | Status: DC

## 2012-05-11 MED ORDER — BORTEZOMIB CHEMO SQ INJECTION 3.5 MG (2.5MG/ML)
3.1000 mg | Freq: Once | INTRAMUSCULAR | Status: AC
  Administered 2012-05-11: 3 mg via SUBCUTANEOUS
  Filled 2012-05-11: qty 3

## 2012-05-11 MED ORDER — FUROSEMIDE 40 MG PO TABS: 40.0000 mg | ORAL_TABLET | Freq: Two times a day (BID) | ORAL | Status: DC

## 2012-05-11 MED ORDER — ONDANSETRON HCL 8 MG PO TABS
8.0000 mg | ORAL_TABLET | Freq: Once | ORAL | Status: AC
  Administered 2012-05-11: 8 mg via ORAL

## 2012-05-11 NOTE — Telephone Encounter (Signed)
gv pt wife appt schedule for June/july.

## 2012-05-11 NOTE — Patient Instructions (Signed)
[facility] Discharge Instructions for Patients Receiving Chemotherapy  Today you received the following chemotherapy agents Velcade. To help prevent nausea and vomiting after your treatment, we encourage you to take your nausea medication Compazine. Begin taking it tonight and take it as often as prescribed for any nausea and/or vomiting.   If you develop nausea and vomiting that is not controlled by your nausea medication, call the clinic. If it is after clinic hours your family physician or the after hours number for the clinic or go to the Emergency Department.   BELOW ARE SYMPTOMS THAT SHOULD BE REPORTED IMMEDIATELY:  *FEVER GREATER THAN 100.5 F  *CHILLS WITH OR WITHOUT FEVER  NAUSEA AND VOMITING THAT IS NOT CONTROLLED WITH YOUR NAUSEA MEDICATION  *UNUSUAL SHORTNESS OF BREATH  *UNUSUAL BRUISING OR BLEEDING  TENDERNESS IN MOUTH AND THROAT WITH OR WITHOUT PRESENCE OF ULCERS  *URINARY PROBLEMS  *BOWEL PROBLEMS  UNUSUAL RASH Items with * indicate a potential emergency and should be followed up as soon as possible.  Please let the nurse know about any problems that you may have experienced. Feel free to call the clinic you have any questions or concerns. The clinic phone number is [phone].   I have been informed and understand all the instructions given to me. I know to contact the clinic, my physician, or go to the Emergency Department if any problems should occur. I do not have any questions at this time, but understand that I may call the clinic during office hours   should I have any questions or need assistance in obtaining follow up care.    __________________________________________  _____________  __________ Signature of Patient or Authorized Representative            Date                   Time    __________________________________________ Nurse's Signature

## 2012-05-11 NOTE — Progress Notes (Signed)
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 Negative cytogenetics and FISH myeloma panel. Cardiac MRI showed no sign of cardiac dysfunction.   PAST TREATMENT: Velcade, dexamethasone, melphalan x 3 cycles. 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   CURRENT THERAPY: started salvage Velcade for persistent serum free light chain on 09/17/2010 (3 weeks on, 1 week off).  His regimen was modified to include Revlimid 5mg  PO d1-21 every 4 weeks and Dexamathasone 10mg  PO qweek in April 2013 to due slightly elevated light chain.   INTERVAL HISTORY: Johnny Mitchell 71 y.o. male returns for regular follow up with his wife.  He reported that after about 10 days of Revlimid 5mg  PO daily, he developed numbness and tingling of his toes in addition to worsened edema.  He stopped Revlimid and had resolution of these symptoms despite continuation of dexamethasone.   Patient denies fatigue, headache, visual changes, confusion, drenching night sweats, palpable lymph node swelling, mucositis, odynophagia, dysphagia, nausea vomiting, jaundice, chest pain, palpitation, shortness of breath, dyspnea on exertion, productive cough, gum bleeding, epistaxis, hematemesis, hemoptysis, abdominal pain, abdominal swelling, early satiety, melena, hematochezia, hematuria, skin rash, spontaneous bleeding, joint swelling, joint pain, heat or cold intolerance, bowel bladder incontinence, back pain, focal motor weakness, depression.   Past Medical History  Diagnosis Date  . Hypertension   . Coronary atherosclerosis of unspecified type of vessel, native or graft   . Cerebrovascular disease, unspecified   . Pure hypercholesterolemia   .  Diverticulosis of colon (without mention of hemorrhage)   . Benign neoplasm of colon   . Unspecified hemorrhoids without mention of complication   . Degenerative disc disease   . 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 Low back pain   . Hemangioma   . Amyloidosis   . LBBB (left bundle branch block)   . 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 OSA (obstructive sleep apnea)   . Pedal edema 05/11/2012    Past Surgical History  Procedure Date  . Knee arthroscopy   . Carpal tunnel release 08/2006    [facility] Thad Ranger at [facility] - Bilateral  . Bone marrow transplant june 2011    Current Outpatient Prescriptions  Medication Sig Dispense Refill  . acyclovir (ZOVIRAX) 400 MG tablet Take 400 mg by mouth daily.       Marland Kitchen aspirin 81 MG tablet Take 81 mg by mouth daily.      Marland Kitchen atenolol (TENORMIN) 100 MG tablet Take 1 tablet (100 mg total) by mouth daily.  90 tablet  3  . furosemide (LASIX) 40 MG tablet Take 1 tablet (40 mg total) by mouth 2 (two) times daily. Once or twice a day  120 tablet  3  . lenalidomide (REVLIMID) 5 MG capsule Take 1 capsule (5 mg total) by mouth daily.  21 capsule  0  . DISCONTD: furosemide (LASIX) 40 MG tablet Take 40 mg by mouth 2 (two) times daily. Once or twice a day       . dexamethasone (DECADRON) 2 MG tablet Take 3 tablets (6 mg total) by mouth once a week.  100 tablet  3  . DISCONTD: furosemide (LASIX) 40 MG tablet  Take 1 tablet (40 mg total) by mouth 2 (two) times daily.  60 tablet  3   No current facility-administered medications for this visit.   Facility-Administered Medications Ordered in Other Visits  Medication Dose Route Frequency Provider Last Rate Last Dose  . bortezomib SQ (VELCADE) chemo injection 3 mg  3 mg Subcutaneous Once Exie Parody, MD   3 mg at 05/11/12 1057  . ondansetron (ZOFRAN) tablet 8 mg  8 mg Oral Once Exie Parody, MD   8 mg at 05/11/12 1027    ALLERGIES:   has no known allergies.  REVIEW  OF SYSTEMS:  The rest of the 14-point review of system was negative.   Filed Vitals:   05/11/12 0927  BP: 140/74  Pulse: 72  Temp: 98.1 F (36.7 C)   Wt Readings from Last 3 Encounters:  05/11/12 269 lb (122.018 kg)  04/13/12 265 lb 14.4 oz (120.611 kg)  03/23/12 259 lb (117.482 kg)   ECOG Performance status: 0-1  PHYSICAL EXAMINATION:   General: mildly obese man in no acute distress. Eyes: no scleral icterus. He had bruises in his bilateral upper eyelids. ENT: no dental indentation on the tongue. ENT: There were no oropharyngeal lesions. Neck was without thyromegaly. Lymphatics: Negative cervical, supraclavicular or axillary adenopathy. Respiratory: lungs were clear bilaterally without wheezing or crackles. Cardiovascular: Regular rate and rhythm, S1/S2, without murmur, rub or gallop. He has 1+ bilateral pedal edema to mid shin GI: abdomen was soft, flat, nontender, nondistended, without organomegaly. Muscoloskeletal: no spinal tenderness of palpation of vertebral spine. Skin exam showed some echymosis on his extensor surfaces of bilateral arms from his dog's scratching him.  Neuro exam was nonfocal. Patient was able to get on and off exam table without assistance. Gait was normal. Patient was alerted and oriented. Attention was good. Language was appropriate. Mood was normal without depression. Speech was not pressured. Thought content was not tangential.     LABORATORY/RADIOLOGY DATA:  Lab Results  Component Value Date   WBC 6.1 05/04/2012   HGB 14.0 05/04/2012   HCT 42.9 05/04/2012   PLT 166 05/04/2012   GLUCOSE 96 05/04/2012   CHOL 155 01/15/2009   TRIG 77 01/15/2009   HDL 39.9 01/15/2009   LDLCALC 100* 01/15/2009   ALKPHOS 84 05/04/2012   ALT 27 05/04/2012   AST 18 05/04/2012   NA 144 05/04/2012   K 4.3 05/04/2012   CL 108 05/04/2012   CREATININE 1.12 05/04/2012   BUN 22 05/04/2012   CO2 27 05/04/2012   PSA 0.81 01/15/2009   INR 1.02 09/30/2011   INR 1.02 09/30/2011    ASSESSMENT AND  PLAN:   1. History of amyloidosis: He has been on maintenance Velcade with the addition of Revlimid and Dexamethasone in April 2013.  He developed grade 1 neuropathy and edema after 10 days of Revlmid.  He has not had dose limiting toxicity.  He would like to try this triple regimen again. I then advised him to decrease Revlimid from 5mg  PO daily to 5mg  PO every other day from day 1-21 of every 4 weeks.  I decreased his dexamethasone down to 6mg  PO qweek.  I will keep Velcade the same.  I do not recommend switching to pamolidomide or Carfilzomib until he has definitive diease progression.  2. DOE: Symptoms controlled with Lasix. He is euvolumic on exam today.   3. Pedal edema. Possibly secondary to venous stasis vs right sided heart failure from OSA. It's stable today without much  symptoms. He is on Lasix BID which I refilled today.  4. Hypertension. Well controlled on atenolol and Lasix per Dr. Jens Som. 5. Chronic lower back pain: recent operation with Ortho from [place] with significant improvement. He is on oxycone qday prn.  6. Follow up: Weekly CBC and Velcade SQ (3wks on, 1 wk off).       The length of time of the face-to-face encounter was 25 minutes. More than 50% of time was spent counseling and coordination of care.

## 2012-05-17 ENCOUNTER — Ambulatory Visit: Payer: Medicare Other | Admitting: Pulmonary Disease

## 2012-05-18 ENCOUNTER — Other Ambulatory Visit (HOSPITAL_BASED_OUTPATIENT_CLINIC_OR_DEPARTMENT_OTHER): Payer: Medicare Other

## 2012-05-18 ENCOUNTER — Ambulatory Visit (HOSPITAL_BASED_OUTPATIENT_CLINIC_OR_DEPARTMENT_OTHER): Payer: Medicare Other

## 2012-05-18 VITALS — BP 136/74 | HR 61 | Temp 97.7°F

## 2012-05-18 DIAGNOSIS — Z5112 Encounter for antineoplastic immunotherapy: Secondary | ICD-10-CM

## 2012-05-18 DIAGNOSIS — R600 Localized edema: Secondary | ICD-10-CM

## 2012-05-18 DIAGNOSIS — E859 Amyloidosis, unspecified: Secondary | ICD-10-CM

## 2012-05-18 LAB — CBC WITH DIFFERENTIAL/PLATELET
Basophils Absolute: 0 10*3/uL (ref 0.0–0.1)
Eosinophils Absolute: 0 10*3/uL (ref 0.0–0.5)
HGB: 15 g/dL (ref 13.0–17.1)
MONO#: 0.1 10*3/uL (ref 0.1–0.9)
NEUT#: 10.5 10*3/uL — ABNORMAL HIGH (ref 1.5–6.5)
RBC: 4.75 10*6/uL (ref 4.20–5.82)
RDW: 13.9 % (ref 11.0–14.6)
WBC: 11.3 10*3/uL — ABNORMAL HIGH (ref 4.0–10.3)
lymph#: 0.7 10*3/uL — ABNORMAL LOW (ref 0.9–3.3)
nRBC: 0 % (ref 0–0)

## 2012-05-18 MED ORDER — ONDANSETRON HCL 8 MG PO TABS
8.0000 mg | ORAL_TABLET | Freq: Once | ORAL | Status: AC
  Administered 2012-05-18: 8 mg via ORAL

## 2012-05-18 MED ORDER — BORTEZOMIB CHEMO SQ INJECTION 3.5 MG (2.5MG/ML)
3.1000 mg | Freq: Once | INTRAMUSCULAR | Status: AC
  Administered 2012-05-18: 3 mg via SUBCUTANEOUS
  Filled 2012-05-18: qty 3

## 2012-05-18 NOTE — Patient Instructions (Signed)
[facility] Discharge Instructions for Patients Receiving Chemotherapy  Today you received the following chemotherapy agent Velcade.  To help prevent nausea and vomiting after your treatment, we encourage you to take your nausea medication. Begin taking it as often as prescribed for by your physician.    If you develop nausea and vomiting that is not controlled by your nausea medication, call the clinic. If it is after clinic hours your family physician or the after hours number for the clinic or go to the Emergency Department.   BELOW ARE SYMPTOMS THAT SHOULD BE REPORTED IMMEDIATELY:  *FEVER GREATER THAN 100.5 F  *CHILLS WITH OR WITHOUT FEVER  NAUSEA AND VOMITING THAT IS NOT CONTROLLED WITH YOUR NAUSEA MEDICATION  *UNUSUAL SHORTNESS OF BREATH  *UNUSUAL BRUISING OR BLEEDING  TENDERNESS IN MOUTH AND THROAT WITH OR WITHOUT PRESENCE OF ULCERS  *URINARY PROBLEMS  *BOWEL PROBLEMS  UNUSUAL RASH Items with * indicate a potential emergency and should be followed up as soon as possible.  One of the nurses will contact you 24 hours after your treatment. Please let the nurse know about any problems that you may have experienced. Feel free to call the clinic you have any questions or concerns. The clinic phone number is [phone].   I have been informed and understand all the instructions given to me. I know to contact the clinic, my physician, or go to the Emergency Department if any problems should occur. I do not have any questions at this time, but understand that I may call the clinic during office hours   should I have any questions or need assistance in obtaining follow up care.    __________________________________________  _____________  __________ Signature of Patient or Authorized Representative            Date                   Time    __________________________________________ Nurse's Signature   Bortezomib injection What is this  medicine? BORTEZOMIB (bor TEZ oh mib) is a chemotherapy drug. It slows the growth of cancer cells. This medicine is used to treat multiple myeloma, lymphoma, and other cancers. This medicine may be used for other purposes; ask your health care provider or pharmacist if you have questions. What should I tell my health care provider before I take this medicine? They need to know if you have any of these conditions: -heart disease -irregular heartbeat -liver disease -low blood counts, like low white blood cells, platelets, or hemoglobin -peripheral neuropathy -taking medicine for blood pressure -an unusual or allergic reaction to bortezomib, mannitol, boron, other medicines, foods, dyes, or preservatives -pregnant or trying to get pregnant -breast-feeding How should I use this medicine? This medicine is for injection into a vein or for injection under the skin. It is given by a health care professional in a hospital or clinic setting. Talk to your pediatrician regarding the use of this medicine in children. Special care may be needed. Overdosage: If you think you have taken too much of this medicine contact a poison control center or emergency room at once. NOTE: This medicine is only for you. Do not share this medicine with others. What if I miss a dose? It is important not to miss your dose. Call your doctor or health care professional if you are unable to keep an appointment. What may interact with this medicine? -medicines for diabetes -medicines to increase blood counts like filgrastim, pegfilgrastim, sargramostim -zalcitabine Talk to your  doctor or health care professional before taking any of these medicines: -acetaminophen -aspirin -ibuprofen -ketoprofen -naproxen This list may not describe all possible interactions. Give your health care provider a list of all the medicines, herbs, non-prescription drugs, or dietary supplements you use. Also tell them if you smoke, drink alcohol,  or use illegal drugs. Some items may interact with your medicine. What should I watch for while using this medicine? Visit your doctor for checks on your progress. This drug may make you feel generally unwell. This is not uncommon, as chemotherapy can affect healthy cells as well as cancer cells. Report any side effects. Continue your course of treatment even though you feel ill unless your doctor tells you to stop. You may get drowsy or dizzy. Do not drive, use machinery, or do anything that needs mental alertness until you know how this medicine affects you. Do not stand or sit up quickly, especially if you are an older patient. This reduces the risk of dizzy or fainting spells. In some cases, you may be given additional medicines to help with side effects. Follow all directions for their use. Call your doctor or health care professional for advice if you get a fever, chills or sore throat, or other symptoms of a cold or flu. Do not treat yourself. This drug decreases your body's ability to fight infections. Try to avoid being around people who are sick. This medicine may increase your risk to bruise or bleed. Call your doctor or health care professional if you notice any unusual bleeding. Be careful brushing and flossing your teeth or using a toothpick because you may get an infection or bleed more easily. If you have any dental work done, tell your dentist you are receiving this medicine. Avoid taking products that contain aspirin, acetaminophen, ibuprofen, naproxen, or ketoprofen unless instructed by your doctor. These medicines may hide a fever. Do not become pregnant while taking this medicine. Women should inform their doctor if they wish to become pregnant or think they might be pregnant. There is a potential for serious side effects to an unborn child. Talk to your health care professional or pharmacist for more information. Do not breast-feed an infant while taking this medicine. You may have  vomiting or diarrhea while taking this medicine. Drink water or other fluids as directed. What side effects may I notice from receiving this medicine? Side effects that you should report to your doctor or health care professional as soon as possible: -allergic reactions like skin rash, itching or hives, swelling of the face, lips, or tongue -breathing problems -changes in hearing -changes in vision -fast, irregular heartbeat -feeling faint or lightheaded, falls -pain, tingling, numbness in the hands or feet -seizures -swelling of the ankles, feet, hands -unusual bleeding or bruising -unusually weak or tired -vomiting Side effects that usually do not require medical attention (report to your doctor or health care professional if they continue or are bothersome): -changes in emotions or moods -constipation -diarrhea -loss of appetite -headache -irritation at site where injected -nausea This list may not describe all possible side effects. Call your doctor for medical advice about side effects. You may report side effects to FDA at 1-800-FDA-1088. Where should I keep my medicine? This drug is given in a hospital or clinic and will not be stored at home. NOTE: This sheet is a summary. It may not cover all possible information. If you have questions about this medicine, talk to your doctor, pharmacist, or health care provider.  2012, [employer]. (01/06/2011 11:42:36 AM)

## 2012-05-24 ENCOUNTER — Encounter: Payer: Self-pay | Admitting: Pulmonary Disease

## 2012-05-24 ENCOUNTER — Ambulatory Visit (INDEPENDENT_AMBULATORY_CARE_PROVIDER_SITE_OTHER): Payer: Medicare Other | Admitting: Pulmonary Disease

## 2012-05-24 VITALS — BP 130/82 | HR 63 | Temp 97.9°F | Ht 72.0 in | Wt 260.8 lb

## 2012-05-24 DIAGNOSIS — G4733 Obstructive sleep apnea (adult) (pediatric): Secondary | ICD-10-CM | POA: Diagnosis not present

## 2012-05-24 DIAGNOSIS — I10 Essential (primary) hypertension: Secondary | ICD-10-CM

## 2012-05-24 MED ORDER — ATENOLOL 100 MG PO TABS: 100.0000 mg | ORAL_TABLET | Freq: Every day | ORAL | Status: DC

## 2012-05-24 NOTE — Assessment & Plan Note (Signed)
He is compliant with therapy, and reports benefit.  Will arrange for new mask.  Will get copy of his download, and call him with results.

## 2012-05-24 NOTE — Progress Notes (Signed)
Chief Complaint  Patient presents with  . Sleep Apnea    Pt states using cpap 8 hours nightly/week . requesting new mask no issues with  pressure     History of Present Illness: Johnny Mitchell is a 70 y.o. male with OSA.  He has been doing well with CPAP.  He uses his machine every night.  He feels this helps his sleep and energy level.  He uses a nasal mask.  He is not having any trouble with his mask.  He is running low on his script for atenolol, and has asked if this can be refilled.  Past Medical History  Diagnosis Date  . Hypertension   . Coronary atherosclerosis of unspecified type of vessel, native or graft   . Cerebrovascular disease, unspecified   . Pure hypercholesterolemia   . Diverticulosis of colon (without mention of hemorrhage)   . Benign neoplasm of colon   . Unspecified hemorrhoids without mention of complication   . Degenerative disc disease   . 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 Low back pain   . Hemangioma   . Amyloidosis   . LBBB (left bundle branch block)   . 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 OSA (obstructive sleep apnea)   . Pedal edema 05/11/2012    Past Surgical History  Procedure Date  . Knee arthroscopy   . Carpal tunnel release 08/2006    Dr Thad Ranger at [facility] - Bilateral  . Bone marrow transplant june 2011    Outpatient Encounter Prescriptions as of 05/24/2012  Medication Sig Dispense Refill  . acyclovir (ZOVIRAX) 400 MG tablet Take 400 mg by mouth daily.       Marland Kitchen aspirin 81 MG tablet Take 81 mg by mouth daily.      Marland Kitchen atenolol (TENORMIN) 100 MG tablet Take 1 tablet (100 mg total) by mouth daily.  90 tablet  3  . dexamethasone (DECADRON) 2 MG tablet Take 3 tablets (6 mg total) by mouth once a week.  100 tablet  3  . furosemide (LASIX) 40 MG tablet Take 1 tablet (40 mg total) by mouth 2 (two) times daily. Once or twice a day  120 tablet  3  . lenalidomide (REVLIMID) 5 MG  capsule Take 1 capsule (5 mg total) by mouth daily.  21 capsule  0    No Known Allergies  Physical Exam:  Blood pressure 130/82, pulse 63, temperature 97.9 F (36.6 C), temperature source Oral, height 6' (1.829 m), weight 260 lb 12.8 oz (118.298 kg), SpO2 95.00%.  Body mass index is 35.37 kg/(m^2).  Wt Readings from Last 2 Encounters:  05/24/12 260 lb 12.8 oz (118.298 kg)  05/11/12 269 lb (122.018 kg)   General - no distress HEENT - no sinus tenderness, no oral exudate, no LAN Cardiac - s1s2 regular Chest - no wheeze, no rales Abd - soft, nontender Ext - ankle edema Neuro - normal strength    Assessment/Plan:  Coralyn Helling, MD [facility] Pager:  [phone] 05/24/2012, 12:14 PM

## 2012-05-24 NOTE — Assessment & Plan Note (Signed)
I have refilled his script for atenolol.

## 2012-05-24 NOTE — Patient Instructions (Addendum)
Will get copy of CPAP report and call with results Will arrange for new CPAP mask Follow up in 1 year 

## 2012-05-25 ENCOUNTER — Ambulatory Visit (HOSPITAL_BASED_OUTPATIENT_CLINIC_OR_DEPARTMENT_OTHER): Payer: Medicare Other

## 2012-05-25 ENCOUNTER — Other Ambulatory Visit (HOSPITAL_BASED_OUTPATIENT_CLINIC_OR_DEPARTMENT_OTHER): Payer: Medicare Other

## 2012-05-25 VITALS — BP 135/74 | HR 64 | Temp 98.0°F

## 2012-05-25 DIAGNOSIS — R600 Localized edema: Secondary | ICD-10-CM

## 2012-05-25 DIAGNOSIS — E859 Amyloidosis, unspecified: Secondary | ICD-10-CM | POA: Diagnosis not present

## 2012-05-25 DIAGNOSIS — Z5112 Encounter for antineoplastic immunotherapy: Secondary | ICD-10-CM | POA: Diagnosis not present

## 2012-05-25 LAB — CBC WITH DIFFERENTIAL/PLATELET
BASO%: 0.1 % (ref 0.0–2.0)
Eosinophils Absolute: 0 10*3/uL (ref 0.0–0.5)
HCT: 43.1 % (ref 38.4–49.9)
LYMPH%: 9.7 % — ABNORMAL LOW (ref 14.0–49.0)
MONO#: 1.3 10*3/uL — ABNORMAL HIGH (ref 0.1–0.9)
NEUT#: 11 10*3/uL — ABNORMAL HIGH (ref 1.5–6.5)
NEUT%: 80.8 % — ABNORMAL HIGH (ref 39.0–75.0)
Platelets: 174 10*3/uL (ref 140–400)
RBC: 4.58 10*6/uL (ref 4.20–5.82)
WBC: 13.6 10*3/uL — ABNORMAL HIGH (ref 4.0–10.3)
lymph#: 1.3 10*3/uL (ref 0.9–3.3)
nRBC: 0 % (ref 0–0)

## 2012-05-25 MED ORDER — BORTEZOMIB CHEMO SQ INJECTION 3.5 MG (2.5MG/ML)
3.1000 mg | Freq: Once | INTRAMUSCULAR | Status: AC
  Administered 2012-05-25: 3 mg via SUBCUTANEOUS
  Filled 2012-05-25: qty 3

## 2012-05-25 MED ORDER — ONDANSETRON HCL 8 MG PO TABS
8.0000 mg | ORAL_TABLET | Freq: Once | ORAL | Status: AC
  Administered 2012-05-25: 8 mg via ORAL

## 2012-05-25 NOTE — Patient Instructions (Signed)
[facility] Discharge Instructions for Patients Receiving Chemotherapy  Today you received the following chemotherapy agents Velcade.  To help prevent nausea and vomiting after your treatment, we encourage you to take your nausea medication as prescribed.   If you develop nausea and vomiting that is not controlled by your nausea medication, call the clinic. If it is after clinic hours your family physician or the after hours number for the clinic or go to the Emergency Department.   BELOW ARE SYMPTOMS THAT SHOULD BE REPORTED IMMEDIATELY:  *FEVER GREATER THAN 100.5 F  *CHILLS WITH OR WITHOUT FEVER  NAUSEA AND VOMITING THAT IS NOT CONTROLLED WITH YOUR NAUSEA MEDICATION  *UNUSUAL SHORTNESS OF BREATH  *UNUSUAL BRUISING OR BLEEDING  TENDERNESS IN MOUTH AND THROAT WITH OR WITHOUT PRESENCE OF ULCERS  *URINARY PROBLEMS  *BOWEL PROBLEMS  UNUSUAL RASH Items with * indicate a potential emergency and should be followed up as soon as possible.  One of the nurses will contact you 24 hours after your treatment. Please let the nurse know about any problems that you may have experienced. Feel free to call the clinic you have any questions or concerns. The clinic phone number is [phone].   I have been informed and understand all the instructions given to me. I know to contact the clinic, my physician, or go to the Emergency Department if any problems should occur. I do not have any questions at this time, but understand that I may call the clinic during office hours   should I have any questions or need assistance in obtaining follow up care.    __________________________________________  _____________  __________ Signature of Patient or Authorized Representative            Date                   Time    __________________________________________ Nurse's Signature    

## 2012-06-01 ENCOUNTER — Encounter: Payer: Self-pay | Admitting: Pulmonary Disease

## 2012-06-02 ENCOUNTER — Other Ambulatory Visit: Payer: Self-pay | Admitting: *Deleted

## 2012-06-02 MED ORDER — LENALIDOMIDE 5 MG PO CAPS: 5.0000 mg | ORAL_CAPSULE | Freq: Every day | ORAL | Status: DC

## 2012-06-02 NOTE — Telephone Encounter (Signed)
Rx refill from [facility] for revlimid given to Dr Lodema Pilot RN for review.  SLJ

## 2012-06-06 NOTE — Telephone Encounter (Signed)
[facility] faxed confirmation of prescription shipment.  Shipped Revlimid on 06-05-2012 with next Business day delivery.

## 2012-06-08 ENCOUNTER — Ambulatory Visit (HOSPITAL_BASED_OUTPATIENT_CLINIC_OR_DEPARTMENT_OTHER): Payer: Medicare Other | Admitting: Oncology

## 2012-06-08 ENCOUNTER — Other Ambulatory Visit: Payer: Self-pay | Admitting: Oncology

## 2012-06-08 ENCOUNTER — Ambulatory Visit (HOSPITAL_BASED_OUTPATIENT_CLINIC_OR_DEPARTMENT_OTHER): Payer: Medicare Other

## 2012-06-08 ENCOUNTER — Other Ambulatory Visit: Payer: Medicare Other | Admitting: Lab

## 2012-06-08 VITALS — BP 138/77 | HR 71 | Temp 98.2°F | Ht 72.0 in | Wt 267.7 lb

## 2012-06-08 DIAGNOSIS — E859 Amyloidosis, unspecified: Secondary | ICD-10-CM

## 2012-06-08 DIAGNOSIS — E8589 Other amyloidosis: Secondary | ICD-10-CM

## 2012-06-08 DIAGNOSIS — R600 Localized edema: Secondary | ICD-10-CM

## 2012-06-08 DIAGNOSIS — Z5112 Encounter for antineoplastic immunotherapy: Secondary | ICD-10-CM | POA: Diagnosis not present

## 2012-06-08 LAB — CBC WITH DIFFERENTIAL/PLATELET
Eosinophils Absolute: 0 10*3/uL (ref 0.0–0.5)
HCT: 45.2 % (ref 38.4–49.9)
LYMPH%: 6 % — ABNORMAL LOW (ref 14.0–49.0)
MCV: 97.1 fL (ref 79.3–98.0)
MONO#: 0.3 10*3/uL (ref 0.1–0.9)
MONO%: 2.5 % (ref 0.0–14.0)
NEUT#: 10.3 10*3/uL — ABNORMAL HIGH (ref 1.5–6.5)
NEUT%: 90.9 % — ABNORMAL HIGH (ref 39.0–75.0)
Platelets: 183 10*3/uL (ref 140–400)
RBC: 4.66 10*6/uL (ref 4.20–5.82)
WBC: 11.4 10*3/uL — ABNORMAL HIGH (ref 4.0–10.3)

## 2012-06-08 MED ORDER — BORTEZOMIB CHEMO SQ INJECTION 3.5 MG (2.5MG/ML)
3.1000 mg | Freq: Once | INTRAMUSCULAR | Status: AC
  Administered 2012-06-08: 3 mg via SUBCUTANEOUS
  Filled 2012-06-08: qty 3

## 2012-06-08 MED ORDER — ONDANSETRON HCL 8 MG PO TABS
8.0000 mg | ORAL_TABLET | Freq: Once | ORAL | Status: AC
  Administered 2012-06-08: 8 mg via ORAL

## 2012-06-08 NOTE — Patient Instructions (Signed)
[facility] Discharge Instructions for Patients Receiving Chemotherapy  Today you received the following chemotherapy agents Velcade.  To help prevent nausea and vomiting after your treatment, we encourage you to take your nausea medication as prescribed.   If you develop nausea and vomiting that is not controlled by your nausea medication, call the clinic. If it is after clinic hours your family physician or the after hours number for the clinic or go to the Emergency Department.   BELOW ARE SYMPTOMS THAT SHOULD BE REPORTED IMMEDIATELY:  *FEVER GREATER THAN 100.5 F  *CHILLS WITH OR WITHOUT FEVER  NAUSEA AND VOMITING THAT IS NOT CONTROLLED WITH YOUR NAUSEA MEDICATION  *UNUSUAL SHORTNESS OF BREATH  *UNUSUAL BRUISING OR BLEEDING  TENDERNESS IN MOUTH AND THROAT WITH OR WITHOUT PRESENCE OF ULCERS  *URINARY PROBLEMS  *BOWEL PROBLEMS  UNUSUAL RASH Items with * indicate a potential emergency and should be followed up as soon as possible.  One of the nurses will contact you 24 hours after your treatment. Please let the nurse know about any problems that you may have experienced. Feel free to call the clinic you have any questions or concerns. The clinic phone number is [phone].   I have been informed and understand all the instructions given to me. I know to contact the clinic, my physician, or go to the Emergency Department if any problems should occur. I do not have any questions at this time, but understand that I may call the clinic during office hours   should I have any questions or need assistance in obtaining follow up care.    __________________________________________  _____________  __________ Signature of Patient or Authorized Representative            Date                   Time    __________________________________________ Nurse's Signature    

## 2012-06-09 ENCOUNTER — Encounter: Payer: Self-pay | Admitting: Oncology

## 2012-06-09 LAB — COMPREHENSIVE METABOLIC PANEL
ALT: 19 U/L (ref 0–53)
AST: 21 U/L (ref 0–37)
Albumin: 3.8 g/dL (ref 3.5–5.2)
Calcium: 8.7 mg/dL (ref 8.4–10.5)
Chloride: 105 mEq/L (ref 96–112)
Potassium: 4.5 mEq/L (ref 3.5–5.3)
Total Protein: 6 g/dL (ref 6.0–8.3)

## 2012-06-09 NOTE — Progress Notes (Signed)
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 Negative cytogenetics and FISH myeloma panel. Cardiac MRI showed no sign of cardiac dysfunction.   PAST TREATMENT: Velcade, dexamethasone, melphalan x 3 cycles. 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   CURRENT THERAPY: started salvage Velcade for persistent serum free light chain on 09/17/2010 (3 weeks on, 1 week off).  His regimen was modified to include Revlimid 5mg  PO d1-21 every 4 weeks and Dexamathasone 10mg  PO qweek in April 2013 to due slightly elevated light chain. The patient developed neuropathy and edema and the patient was instructed to decrease his Revlimid to 5 mg every other day, decrease his Decadron to 6 mg once a week, and no dose modification of his Velcade on 05/11/12.  INTERVAL HISTORY: Johnny Mitchell 71 y.o. male returns for regular follow up by himself.  The patient tells me today that he has already started this cycle of Revlimid about 5 days. He no longer thinks that his neuropathy and edema were due to the medications and decided to increase the Revlimid dose back to 5 mg daily. He also states that he is taking Decadron 4 mg twice a week. The patient has mild fatigue. He continues to work full-time. No chest pain, shortness of breath, nausea, vomiting. 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 Edema is stable. He is taking Lasix once a day (prescribed twice a day).  Patient denies fatigue, headache, visual changes, confusion, drenching night sweats, palpable lymph node swelling, mucositis, odynophagia, dysphagia, nausea vomiting, jaundice, chest pain, palpitation, shortness of breath, dyspnea on exertion, productive cough, gum  bleeding, epistaxis, hematemesis, hemoptysis, abdominal pain, abdominal swelling, early satiety, melena, hematochezia, hematuria, skin rash, spontaneous bleeding, joint swelling, joint pain, heat or cold intolerance, bowel bladder incontinence, back pain, focal motor weakness, depression.   Past Medical History  Diagnosis Date  . Hypertension   . Coronary atherosclerosis of unspecified type of vessel, native or graft   . Cerebrovascular disease, unspecified   . Pure hypercholesterolemia   . Diverticulosis of colon (without mention of hemorrhage)   . Benign neoplasm of colon   . Unspecified hemorrhoids without mention of complication   . Degenerative disc disease   . 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 Low back pain   . Hemangioma   . Amyloidosis   . LBBB (left bundle branch block)   . 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 OSA (obstructive sleep apnea)   . Pedal edema 05/11/2012    Past Surgical History  Procedure Date  . Knee arthroscopy   . Carpal tunnel release 08/2006    Dr Thad Ranger at [facility] - Bilateral  . Bone marrow transplant june 2011    Current Outpatient Prescriptions  Medication Sig Dispense Refill  . acyclovir (ZOVIRAX) 400 MG tablet Take 400 mg by mouth daily.       Marland Kitchen aspirin 81 MG tablet Take 81 mg by mouth daily.      Marland Kitchen atenolol (TENORMIN) 100 MG tablet Take 1 tablet (100 mg total) by mouth daily.  90 tablet  3  . dexamethasone (DECADRON) 2 MG tablet Take 3 tablets (6 mg total) by mouth  once a week.  100 tablet  3  . furosemide (LASIX) 40 MG tablet Take 1 tablet (40 mg total) by mouth 2 (two) times daily. Once or twice a day  120 tablet  3  . lenalidomide (REVLIMID) 5 MG capsule Take 1 capsule (5 mg total) by mouth daily.  21 capsule  0  . DISCONTD: furosemide (LASIX) 40 MG tablet Take 1 tablet (40 mg total) by mouth 2 (two) times daily.  60 tablet  3   No current facility-administered medications for  this visit.   Facility-Administered Medications Ordered in Other Visits  Medication Dose Route Frequency Provider Last Rate Last Dose  . bortezomib SQ (VELCADE) chemo injection 3 mg  3 mg Subcutaneous Once Exie Parody, MD   3 mg at 06/08/12 1125  . ondansetron (ZOFRAN) tablet 8 mg  8 mg Oral Once Exie Parody, MD   8 mg at 06/08/12 1055    ALLERGIES:   has no known allergies.  REVIEW OF SYSTEMS:  The rest of the 14-point review of system was negative.   Filed Vitals:   06/08/12 0947  BP: 138/77  Pulse: 71  Temp: 98.2 F (36.8 C)   Wt Readings from Last 3 Encounters:  06/08/12 267 lb 11.2 oz (121.428 kg)  05/24/12 260 lb 12.8 oz (118.298 kg)  05/11/12 269 lb (122.018 kg)   ECOG Performance status: 0-1  PHYSICAL EXAMINATION:   General: mildly obese man in no acute distress. Eyes: no scleral icterus. ENT: no dental indentation on the tongue. ENT: There were no oropharyngeal lesions. Neck was without thyromegaly. Lymphatics: Negative cervical, supraclavicular or axillary adenopathy. Respiratory: lungs were clear bilaterally without wheezing or crackles. Cardiovascular: Regular rate and rhythm, S1/S2, without murmur, rub or gallop. He has 1+ bilateral pedal edema to mid shin GI: abdomen was soft, flat, nontender, nondistended, without organomegaly. Muscoloskeletal: no spinal tenderness of palpation of vertebral spine. Skin exam showed some echymosis on his extensor surfaces of bilateral arms from his dog's scratching him.  Neuro exam was nonfocal. Patient was able to get on and off exam table without assistance. Gait was normal. Patient was alerted and oriented. Attention was good. Language was appropriate. Mood was normal without depression. Speech was not pressured. Thought content was not tangential.   LABORATORY/RADIOLOGY DATA:  Lab Results  Component Value Date   WBC 11.4* 06/08/2012   HGB 14.6 06/08/2012   HCT 45.2 06/08/2012   PLT 183 06/08/2012   GLUCOSE 111* 06/08/2012   CHOL 155  01/15/2009   TRIG 77 01/15/2009   HDL 39.9 01/15/2009   LDLCALC 100* 01/15/2009   ALKPHOS 88 06/08/2012   ALT 19 06/08/2012   AST 21 06/08/2012   NA 142 06/08/2012   K 4.5 06/08/2012   CL 105 06/08/2012   CREATININE 0.95 06/08/2012   BUN 19 06/08/2012   CO2 29 06/08/2012   PSA 0.81 01/15/2009   INR 1.02 09/30/2011   INR 1.02 09/30/2011    ASSESSMENT AND PLAN:   1. History of amyloidosis: He has been on maintenance Velcade with the addition of Revlimid and Dexamethasone in April 2013.  He developed grade 1 neuropathy and edema after 10 days of Revlmid.  He initially stopped the medication and then was instructed to take Revlimid 5 mg every other day with Dexamethasone 6 mg weekly. The patient would like to go back to the previous dose as he is already taking this already. Therefore, the patient will take Revlimid from 5mg  PO daily day  1-21 of every 4 weeks. He will take Decadron 10 mg weekly; I have instructed him to take 5 tablets all on the same day; preferably on the day he receives his Velcade so that he can remember this easier. I will keep Velcade the same.  I do not recommend switching to pamolidomide or Carfilzomib until he has definitive diease progression.  2. DOE: Symptoms controlled with Lasix. He is euvolumic on exam today.   3. Pedal edema. Possibly secondary to venous stasis vs right sided heart failure from OSA. It's stable today without much symptoms. The patient is taking Lasix daily. 4. Hypertension. Well controlled on atenolol and Lasix per Dr. Jens Som. 5. Chronic lower back pain: recent operation with Ortho from [place] with significant improvement. He is on oxycone qday prn.  6. Follow up: Weekly CBC and Velcade SQ (3wks on, 1 wk off). Visit in about 1 month.   The length of time of the face-to-face encounter was 25 minutes. More than 50% of time was spent counseling and coordination of care.

## 2012-06-16 ENCOUNTER — Other Ambulatory Visit (HOSPITAL_BASED_OUTPATIENT_CLINIC_OR_DEPARTMENT_OTHER): Payer: Medicare Other | Admitting: Lab

## 2012-06-16 ENCOUNTER — Ambulatory Visit (HOSPITAL_BASED_OUTPATIENT_CLINIC_OR_DEPARTMENT_OTHER): Payer: Medicare Other

## 2012-06-16 VITALS — BP 138/76 | HR 63 | Temp 97.7°F

## 2012-06-16 DIAGNOSIS — R600 Localized edema: Secondary | ICD-10-CM

## 2012-06-16 DIAGNOSIS — Z5112 Encounter for antineoplastic immunotherapy: Secondary | ICD-10-CM | POA: Diagnosis not present

## 2012-06-16 DIAGNOSIS — E859 Amyloidosis, unspecified: Secondary | ICD-10-CM | POA: Diagnosis not present

## 2012-06-16 LAB — CBC WITH DIFFERENTIAL/PLATELET
Basophils Absolute: 0 10*3/uL (ref 0.0–0.1)
Eosinophils Absolute: 0 10*3/uL (ref 0.0–0.5)
HGB: 14.5 g/dL (ref 13.0–17.1)
MCV: 94.3 fL (ref 79.3–98.0)
MONO#: 0.5 10*3/uL (ref 0.1–0.9)
NEUT#: 10.4 10*3/uL — ABNORMAL HIGH (ref 1.5–6.5)
RBC: 4.56 10*6/uL (ref 4.20–5.82)
RDW: 13.7 % (ref 11.0–14.6)
WBC: 11.7 10*3/uL — ABNORMAL HIGH (ref 4.0–10.3)
lymph#: 0.8 10*3/uL — ABNORMAL LOW (ref 0.9–3.3)
nRBC: 0 % (ref 0–0)

## 2012-06-16 MED ORDER — BORTEZOMIB CHEMO SQ INJECTION 3.5 MG (2.5MG/ML)
3.1000 mg | Freq: Once | INTRAMUSCULAR | Status: AC
  Administered 2012-06-16: 3 mg via SUBCUTANEOUS
  Filled 2012-06-16: qty 3

## 2012-06-16 MED ORDER — ONDANSETRON HCL 8 MG PO TABS
8.0000 mg | ORAL_TABLET | Freq: Once | ORAL | Status: AC
  Administered 2012-06-16: 8 mg via ORAL

## 2012-06-16 NOTE — Patient Instructions (Signed)
[facility] Discharge Instructions for Patients Receiving Chemotherapy  Today you received the following chemotherapy agents Velcade  To help prevent nausea and vomiting after your treatment, we encourage you to take your nausea medication  Begin taking it at 7 pm and take it as often as prescribed for the next 24 to 72 hours.   If you develop nausea and vomiting that is not controlled by your nausea medication, call the clinic. If it is after clinic hours your family physician or the after hours number for the clinic or go to the Emergency Department.   BELOW ARE SYMPTOMS THAT SHOULD BE REPORTED IMMEDIATELY:  *FEVER GREATER THAN 100.5 F  *CHILLS WITH OR WITHOUT FEVER  NAUSEA AND VOMITING THAT IS NOT CONTROLLED WITH YOUR NAUSEA MEDICATION  *UNUSUAL SHORTNESS OF BREATH  *UNUSUAL BRUISING OR BLEEDING  TENDERNESS IN MOUTH AND THROAT WITH OR WITHOUT PRESENCE OF ULCERS  *URINARY PROBLEMS  *BOWEL PROBLEMS  UNUSUAL RASH Items with * indicate a potential emergency and should be followed up as soon as possible.  One of the nurses will contact you 24 hours after your treatment. Please let the nurse know about any problems that you may have experienced. Feel free to call the clinic you have any questions or concerns. The clinic phone number is [phone].   I have been informed and understand all the instructions given to me. I know to contact the clinic, my physician, or go to the Emergency Department if any problems should occur. I do not have any questions at this time, but understand that I may call the clinic during office hours   should I have any questions or need assistance in obtaining follow up care.    __________________________________________  _____________  __________ Signature of Patient or Authorized Representative            Date                   Time    __________________________________________ Nurse's Signature    

## 2012-06-22 ENCOUNTER — Ambulatory Visit (HOSPITAL_BASED_OUTPATIENT_CLINIC_OR_DEPARTMENT_OTHER): Payer: Medicare Other

## 2012-06-22 ENCOUNTER — Other Ambulatory Visit (HOSPITAL_BASED_OUTPATIENT_CLINIC_OR_DEPARTMENT_OTHER): Payer: Medicare Other

## 2012-06-22 VITALS — BP 155/76 | HR 56 | Temp 98.3°F

## 2012-06-22 DIAGNOSIS — R600 Localized edema: Secondary | ICD-10-CM

## 2012-06-22 DIAGNOSIS — Z5112 Encounter for antineoplastic immunotherapy: Secondary | ICD-10-CM | POA: Diagnosis not present

## 2012-06-22 DIAGNOSIS — E859 Amyloidosis, unspecified: Secondary | ICD-10-CM | POA: Diagnosis not present

## 2012-06-22 DIAGNOSIS — Z9484 Stem cells transplant status: Secondary | ICD-10-CM | POA: Diagnosis not present

## 2012-06-22 DIAGNOSIS — Z79899 Other long term (current) drug therapy: Secondary | ICD-10-CM | POA: Diagnosis not present

## 2012-06-22 LAB — CBC WITH DIFFERENTIAL/PLATELET
BASO%: 0.2 % (ref 0.0–2.0)
Basophils Absolute: 0 10*3/uL (ref 0.0–0.1)
Eosinophils Absolute: 0 10*3/uL (ref 0.0–0.5)
HCT: 42.8 % (ref 38.4–49.9)
HGB: 14.5 g/dL (ref 13.0–17.1)
LYMPH%: 11.1 % — ABNORMAL LOW (ref 14.0–49.0)
MCHC: 33.9 g/dL (ref 32.0–36.0)
MONO#: 0.7 10*3/uL (ref 0.1–0.9)
NEUT#: 7.3 10*3/uL — ABNORMAL HIGH (ref 1.5–6.5)
NEUT%: 81.1 % — ABNORMAL HIGH (ref 39.0–75.0)
Platelets: 180 10*3/uL (ref 140–400)
WBC: 9 10*3/uL (ref 4.0–10.3)
lymph#: 1 10*3/uL (ref 0.9–3.3)

## 2012-06-22 MED ORDER — ONDANSETRON HCL 8 MG PO TABS
8.0000 mg | ORAL_TABLET | Freq: Once | ORAL | Status: AC
  Administered 2012-06-22: 8 mg via ORAL

## 2012-06-22 MED ORDER — BORTEZOMIB CHEMO SQ INJECTION 3.5 MG (2.5MG/ML)
3.1000 mg | Freq: Once | INTRAMUSCULAR | Status: AC
  Administered 2012-06-22: 3 mg via SUBCUTANEOUS
  Filled 2012-06-22: qty 3

## 2012-06-22 NOTE — Patient Instructions (Signed)
[facility] Discharge Instructions for Patients Receiving Chemotherapy  Today you received the following chemotherapy agents Velcade.  To help prevent nausea and vomiting after your treatment, we encourage you to take your nausea medication as prescribed.   If you develop nausea and vomiting that is not controlled by your nausea medication, call the clinic. If it is after clinic hours your family physician or the after hours number for the clinic or go to the Emergency Department.   BELOW ARE SYMPTOMS THAT SHOULD BE REPORTED IMMEDIATELY:  *FEVER GREATER THAN 100.5 F  *CHILLS WITH OR WITHOUT FEVER  NAUSEA AND VOMITING THAT IS NOT CONTROLLED WITH YOUR NAUSEA MEDICATION  *UNUSUAL SHORTNESS OF BREATH  *UNUSUAL BRUISING OR BLEEDING  TENDERNESS IN MOUTH AND THROAT WITH OR WITHOUT PRESENCE OF ULCERS  *URINARY PROBLEMS  *BOWEL PROBLEMS  UNUSUAL RASH Items with * indicate a potential emergency and should be followed up as soon as possible.  One of the nurses will contact you 24 hours after your treatment. Please let the nurse know about any problems that you may have experienced. Feel free to call the clinic you have any questions or concerns. The clinic phone number is [phone].   I have been informed and understand all the instructions given to me. I know to contact the clinic, my physician, or go to the Emergency Department if any problems should occur. I do not have any questions at this time, but understand that I may call the clinic during office hours   should I have any questions or need assistance in obtaining follow up care.    __________________________________________  _____________  __________ Signature of Patient or Authorized Representative            Date                   Time    __________________________________________ Nurse's Signature    

## 2012-06-26 ENCOUNTER — Other Ambulatory Visit: Payer: Self-pay | Admitting: *Deleted

## 2012-06-26 DIAGNOSIS — D72829 Elevated white blood cell count, unspecified: Secondary | ICD-10-CM

## 2012-06-26 MED ORDER — LENALIDOMIDE 5 MG PO CAPS: 5.0000 mg | ORAL_CAPSULE | Freq: Every day | ORAL | Status: DC

## 2012-06-26 NOTE — Telephone Encounter (Signed)
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 

## 2012-06-26 NOTE — Addendum Note (Signed)
Addended by: Arvilla Meres on: 06/26/2012 12:35 PM   Modules accepted: Orders

## 2012-06-26 NOTE — Telephone Encounter (Signed)
RECEIVED A FAX FROM [facility] CONCERNING A CONFIRMATION OF FACSIMILE RECEIPT FOR A REFERRAL. 

## 2012-06-29 DIAGNOSIS — E859 Amyloidosis, unspecified: Secondary | ICD-10-CM | POA: Diagnosis not present

## 2012-07-03 ENCOUNTER — Encounter: Payer: Self-pay | Admitting: *Deleted

## 2012-07-03 NOTE — Progress Notes (Signed)
Fax received from [facility] they shipped pt's Revlimid on 06/30/12 for delivery on 07/03/12.

## 2012-07-06 ENCOUNTER — Telehealth: Payer: Self-pay | Admitting: Oncology

## 2012-07-06 ENCOUNTER — Other Ambulatory Visit (HOSPITAL_BASED_OUTPATIENT_CLINIC_OR_DEPARTMENT_OTHER): Payer: Medicare Other

## 2012-07-06 ENCOUNTER — Ambulatory Visit (HOSPITAL_BASED_OUTPATIENT_CLINIC_OR_DEPARTMENT_OTHER): Payer: Medicare Other | Admitting: Oncology

## 2012-07-06 ENCOUNTER — Ambulatory Visit (HOSPITAL_BASED_OUTPATIENT_CLINIC_OR_DEPARTMENT_OTHER): Payer: Medicare Other

## 2012-07-06 VITALS — BP 116/67 | HR 70 | Temp 98.3°F | Ht 72.0 in | Wt 264.3 lb

## 2012-07-06 DIAGNOSIS — R0989 Other specified symptoms and signs involving the circulatory and respiratory systems: Secondary | ICD-10-CM

## 2012-07-06 DIAGNOSIS — E859 Amyloidosis, unspecified: Secondary | ICD-10-CM

## 2012-07-06 DIAGNOSIS — R0609 Other forms of dyspnea: Secondary | ICD-10-CM | POA: Diagnosis not present

## 2012-07-06 DIAGNOSIS — R609 Edema, unspecified: Secondary | ICD-10-CM | POA: Diagnosis not present

## 2012-07-06 DIAGNOSIS — Z5112 Encounter for antineoplastic immunotherapy: Secondary | ICD-10-CM | POA: Diagnosis not present

## 2012-07-06 DIAGNOSIS — R600 Localized edema: Secondary | ICD-10-CM

## 2012-07-06 LAB — CBC WITH DIFFERENTIAL/PLATELET
BASO%: 0.7 % (ref 0.0–2.0)
EOS%: 1.8 % (ref 0.0–7.0)
HCT: 43.3 % (ref 38.4–49.9)
MCH: 32.4 pg (ref 27.2–33.4)
MCHC: 33.5 g/dL (ref 32.0–36.0)
MONO#: 0.6 10*3/uL (ref 0.1–0.9)
NEUT%: 78 % — ABNORMAL HIGH (ref 39.0–75.0)
RDW: 14.3 % (ref 11.0–14.6)
WBC: 7.1 10*3/uL (ref 4.0–10.3)
lymph#: 0.8 10*3/uL — ABNORMAL LOW (ref 0.9–3.3)

## 2012-07-06 MED ORDER — ONDANSETRON HCL 8 MG PO TABS
8.0000 mg | ORAL_TABLET | Freq: Once | ORAL | Status: AC
  Administered 2012-07-06: 8 mg via ORAL

## 2012-07-06 MED ORDER — BORTEZOMIB CHEMO SQ INJECTION 3.5 MG (2.5MG/ML)
3.1000 mg | Freq: Once | INTRAMUSCULAR | Status: AC
  Administered 2012-07-06: 3 mg via SUBCUTANEOUS
  Filled 2012-07-06: qty 3

## 2012-07-06 NOTE — Patient Instructions (Signed)
[facility] Discharge Instructions for Patients Receiving Chemotherapy  Today you received the following chemotherapy agents velcade SQ  To help prevent nausea and vomiting after your treatment, we encourage you to take your nausea medication in needed Begin taking it at 430pm and take it as often as prescribed for the next 48 hours if needed.   If you develop nausea and vomiting that is not controlled by your nausea medication, call the clinic. If it is after clinic hours your family physician or the after hours number for the clinic or go to the Emergency Department.   BELOW ARE SYMPTOMS THAT SHOULD BE REPORTED IMMEDIATELY:  *FEVER GREATER THAN 100.5 F  *CHILLS WITH OR WITHOUT FEVER  NAUSEA AND VOMITING THAT IS NOT CONTROLLED WITH YOUR NAUSEA MEDICATION  *UNUSUAL SHORTNESS OF BREATH  *UNUSUAL BRUISING OR BLEEDING  TENDERNESS IN MOUTH AND THROAT WITH OR WITHOUT PRESENCE OF ULCERS  *URINARY PROBLEMS  *BOWEL PROBLEMS  UNUSUAL RASH Items with * indicate a potential emergency and should be followed up as soon as possible.  One of the nurses will contact you 24 hours after your treatment. Please let the nurse know about any problems that you may have experienced. Feel free to call the clinic you have any questions or concerns. The clinic phone number is [phone].   I have been informed and understand all the instructions given to me. I know to contact the clinic, my physician, or go to the Emergency Department if any problems should occur. I do not have any questions at this time, but understand that I may call the clinic during office hours   should I have any questions or need assistance in obtaining follow up care.    __________________________________________  _____________  __________ Signature of Patient or Authorized Representative            Date                   Time    __________________________________________ Nurse's Signature

## 2012-07-06 NOTE — Progress Notes (Signed)
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 Negative cytogenetics and FISH myeloma panel. Cardiac MRI showed no sign of cardiac dysfunction.   PAST TREATMENT: Velcade, dexamethasone, melphalan x 3 cycles. 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   CURRENT THERAPY: started salvage Velcade for persistent serum free light chain on 09/17/2010 (3 weeks on, 1 week off). His regimen was modified to include Revlimid 5mg  PO d1-21 every 4 weeks and Dexamathasone 10mg  PO qweek in April 2013 to due slightly elevated light chain.   INTERVAL HISTORY: Johnny Mitchell 71 y.o. male returns for regular follow up.  He reports feeling relatively well.  He was recently evaluated by Dr. Barbaraann Boys from [facility].  She agreed with continuation of this current regimen for now.  He still has right leg numbness; and thus, she added on gabapentin 300mg  PO daily.  He has some relief of the right leg numbness.  He denied side effects of this chemo regimen. He has stable bilateral leg swelling which he takes Lasix for with control of edema.   Patient denies fever, anorexia, weight loss, fatigue, headache, visual changes, confusion, drenching night sweats, palpable lymph node swelling, mucositis, odynophagia, dysphagia, nausea vomiting, jaundice, chest pain, palpitation, shortness of breath, dyspnea on exertion, productive cough, gum bleeding, epistaxis, hematemesis, hemoptysis, abdominal pain, abdominal swelling, early satiety, melena, hematochezia, hematuria, skin rash, spontaneous bleeding, joint swelling, joint pain, heat or cold intolerance, bowel bladder incontinence, back pain, focal motor weakness, paresthesia, depression, suicidal or  homocidal ideation, feeling hopelessness.   Past Medical History  Diagnosis Date  . Hypertension   . Coronary atherosclerosis of unspecified type of vessel, native or graft   . Cerebrovascular disease, unspecified   . Pure hypercholesterolemia   . Diverticulosis of colon (without mention of hemorrhage)   . Benign neoplasm of colon   . Unspecified hemorrhoids without mention of complication   . Degenerative disc disease   . 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 Low back pain   . Hemangioma   . Amyloidosis   . LBBB (left bundle branch block)   . 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 OSA (obstructive sleep apnea)   . Pedal edema 05/11/2012    Past Surgical History  Procedure Date  . Knee arthroscopy   . Carpal tunnel release 08/2006    Dr Thad Ranger at [facility] - Bilateral  . Bone marrow transplant june 2011    Current Outpatient Prescriptions  Medication Sig Dispense Refill  . acyclovir (ZOVIRAX) 400 MG tablet Take 400 mg by mouth daily.       Marland Kitchen aspirin 81 MG tablet Take 81 mg by mouth daily.      Marland Kitchen atenolol (TENORMIN) 100 MG tablet Take 1 tablet (100 mg total) by mouth daily.  90 tablet  3  . dexamethasone (DECADRON) 2 MG tablet Take 3 tablets (6 mg total) by mouth once a week.  100 tablet  3  . furosemide (LASIX) 40 MG tablet Take 1 tablet (40 mg total) by mouth 2 (two) times daily. Once or twice a day  120 tablet  3  . GABAPENTIN PO Take by mouth at bedtime.      Marland Kitchen  lenalidomide (REVLIMID) 5 MG capsule Take 1 capsule (5 mg total) by mouth daily.  21 capsule  0  . DISCONTD: furosemide (LASIX) 40 MG tablet Take 1 tablet (40 mg total) by mouth 2 (two) times daily.  60 tablet  3   No current facility-administered medications for this visit.   Facility-Administered Medications Ordered in Other Visits  Medication Dose Route Frequency Provider Last Rate Last Dose  . bortezomib SQ (VELCADE) chemo injection 3 mg  3 mg  Subcutaneous Once Exie Parody, MD   3 mg at 07/06/12 1050  . ondansetron (ZOFRAN) tablet 8 mg  8 mg Oral Once Exie Parody, MD   8 mg at 07/06/12 1037    ALLERGIES:   has no known allergies.  REVIEW OF SYSTEMS:  The rest of the 14-point review of system was negative.   Filed Vitals:   07/06/12 0936  BP: 116/67  Pulse: 70  Temp: 98.3 F (36.8 C)   Wt Readings from Last 3 Encounters:  07/06/12 264 lb 4.8 oz (119.886 kg)  06/08/12 267 lb 11.2 oz (121.428 kg)  05/24/12 260 lb 12.8 oz (118.298 kg)   ECOG Performance status: 0-1  PHYSICAL EXAMINATION:   General: mildly obese man in no acute distress. Eyes: no scleral icterus. He had bruises in his bilateral upper eyelids. ENT: no dental indentation on the tongue. ENT: There were no oropharyngeal lesions. Neck was without thyromegaly. Lymphatics: Negative cervical, supraclavicular or axillary adenopathy. Respiratory: lungs were clear bilaterally without wheezing or crackles. Cardiovascular: Regular rate and rhythm, S1/S2, without murmur, rub or gallop. He has 1+ bilateral pedal edema to mid shin GI: abdomen was soft, flat, nontender, nondistended, without organomegaly. Muscoloskeletal: no spinal tenderness of palpation of vertebral spine. Skin exam showed some echymosis on his extensor surfaces of bilateral arms from his dog's scratching him. Neuro exam was nonfocal. Patient was able to get on and off exam table without assistance. Gait was normal. Patient was alerted and oriented. Attention was good. Language was appropriate. Mood was normal without depression. Speech was not pressured. Thought content was not tangential.    LABORATORY/RADIOLOGY DATA:  Lab Results  Component Value Date   WBC 7.1 07/06/2012   HGB 14.5 07/06/2012   HCT 43.3 07/06/2012   PLT 171 07/06/2012   GLUCOSE 111* 06/08/2012   CHOL 155 01/15/2009   TRIG 77 01/15/2009   HDL 39.9 01/15/2009   LDLCALC 100* 01/15/2009   ALKPHOS 88 06/08/2012   ALT 19 06/08/2012   AST 21 06/08/2012    NA 142 06/08/2012   K 4.5 06/08/2012   CL 105 06/08/2012   CREATININE 0.95 06/08/2012   BUN 19 06/08/2012   CO2 29 06/08/2012   PSA 0.81 01/15/2009   INR 1.02 09/30/2011   INR 1.02 09/30/2011     ASSESSMENT AND PLAN:   1. History of amyloidosis: He has been on maintenance Velcade with the addition of Revlimid and Dexamethasone in April 2013. He has been doing well on this chemo regimen.  There is some right leg neuropathy which is most likely from radiculopathy and less likely from chemo.  Regardless, he is on gabapentin.  He has been independently taking and stopping Revlimid.  I strongly advised him to be adherent to the 21d on, 7 days of schedule to decrease risk of complication.  He was planning to go to the beach next week.  Therefore, we will start the next cycle with synchronization of both Velcade and Revlimid on 07/20/2012.  2.  DOE: Symptoms controlled with Lasix. He is euvolumic on exam today.  3. Right leg/foot numbness:  Due to radiculopathy; less likely from chemo.  He is no gabapentin 300mg  PO qhs with relief.  4. Pedal edema. Possibly secondary to venous stasis vs right sided heart failure from OSA. It's stable today without much symptoms. He is on Lasix BID.  5. Hypertension. Well controlled on atenolol and Lasix per Dr. Jens Som. 6. Chronic lower back pain: recent operation with Ortho from [place] with significant improvement. He is on oxycone qday prn.  7. Follow up: Weekly CBC and Velcade SQ (3wks on, 1 wk off).      The length of time of the face-to-face encounter was 15 minutes. More than 50% of time was spent counseling and coordination of care.

## 2012-07-06 NOTE — Telephone Encounter (Signed)
Gave , pt appt for August 2013 and September 2013 lab, chemo and ML

## 2012-07-07 LAB — COMPREHENSIVE METABOLIC PANEL
ALT: 15 U/L (ref 0–53)
AST: 16 U/L (ref 0–37)
Albumin: 3.5 g/dL (ref 3.5–5.2)
CO2: 29 mEq/L (ref 19–32)
Calcium: 8.9 mg/dL (ref 8.4–10.5)
Chloride: 106 mEq/L (ref 96–112)
Potassium: 4.1 mEq/L (ref 3.5–5.3)
Sodium: 144 mEq/L (ref 135–145)
Total Protein: 5.8 g/dL — ABNORMAL LOW (ref 6.0–8.3)

## 2012-07-07 LAB — KAPPA/LAMBDA LIGHT CHAINS: Kappa free light chain: 5.84 mg/dL — ABNORMAL HIGH (ref 0.33–1.94)

## 2012-07-20 ENCOUNTER — Other Ambulatory Visit: Payer: Medicare Other | Admitting: Lab

## 2012-07-20 ENCOUNTER — Ambulatory Visit (HOSPITAL_BASED_OUTPATIENT_CLINIC_OR_DEPARTMENT_OTHER): Payer: Medicare Other

## 2012-07-20 VITALS — BP 153/85 | HR 66 | Temp 98.0°F | Resp 18

## 2012-07-20 DIAGNOSIS — Z5112 Encounter for antineoplastic immunotherapy: Secondary | ICD-10-CM | POA: Diagnosis not present

## 2012-07-20 DIAGNOSIS — D72829 Elevated white blood cell count, unspecified: Secondary | ICD-10-CM | POA: Diagnosis not present

## 2012-07-20 DIAGNOSIS — E859 Amyloidosis, unspecified: Secondary | ICD-10-CM

## 2012-07-20 LAB — CBC WITH DIFFERENTIAL/PLATELET
BASO%: 0.8 % (ref 0.0–2.0)
HCT: 41 % (ref 38.4–49.9)
LYMPH%: 9.4 % — ABNORMAL LOW (ref 14.0–49.0)
MCHC: 33.4 g/dL (ref 32.0–36.0)
MCV: 94.7 fL (ref 79.3–98.0)
MONO#: 0.3 10*3/uL (ref 0.1–0.9)
MONO%: 4.5 % (ref 0.0–14.0)
NEUT%: 85 % — ABNORMAL HIGH (ref 39.0–75.0)
Platelets: 174 10*3/uL (ref 140–400)
RBC: 4.33 10*6/uL (ref 4.20–5.82)
WBC: 7.6 10*3/uL (ref 4.0–10.3)

## 2012-07-20 MED ORDER — BORTEZOMIB CHEMO SQ INJECTION 3.5 MG (2.5MG/ML)
3.1000 mg | Freq: Once | INTRAMUSCULAR | Status: AC
  Administered 2012-07-20: 3 mg via SUBCUTANEOUS
  Filled 2012-07-20: qty 3

## 2012-07-20 MED ORDER — ONDANSETRON HCL 8 MG PO TABS
8.0000 mg | ORAL_TABLET | Freq: Once | ORAL | Status: AC
  Administered 2012-07-20: 8 mg via ORAL

## 2012-07-20 NOTE — Patient Instructions (Signed)
[facility] Discharge Instructions for Patients Receiving Chemotherapy  Today you received the following chemotherapy agents Velcade To help prevent nausea and vomiting after your treatment, we encourage you to take your nausea medication   Take it as often as prescribed.   If you develop nausea and vomiting that is not controlled by your nausea medication, call the clinic. If it is after clinic hours your family physician or the after hours number for the clinic or go to the Emergency Department.   BELOW ARE SYMPTOMS THAT SHOULD BE REPORTED IMMEDIATELY:  *FEVER GREATER THAN 100.5 F  *CHILLS WITH OR WITHOUT FEVER  NAUSEA AND VOMITING THAT IS NOT CONTROLLED WITH YOUR NAUSEA MEDICATION  *UNUSUAL SHORTNESS OF BREATH  *UNUSUAL BRUISING OR BLEEDING  TENDERNESS IN MOUTH AND THROAT WITH OR WITHOUT PRESENCE OF ULCERS  *URINARY PROBLEMS  *BOWEL PROBLEMS  UNUSUAL RASH Items with * indicate a potential emergency and should be followed up as soon as possible.  If this is your first treatment one of the nurses will contact you 24 hours after your treatment. Please let the nurse know about any problems that you may have experienced. Feel free to call the clinic you have any questions or concerns. The clinic phone number is [phone].   I have been informed and understand all the instructions given to me. I know to contact the clinic, my physician, or go to the Emergency Department if any problems should occur. I do not have any questions at this time, but understand that I may call the clinic during office hours   should I have any questions or need assistance in obtaining follow up care.    __________________________________________  _____________  __________ Signature of Patient or Authorized Representative            Date                   Time    __________________________________________ Nurse's Signature    

## 2012-07-27 ENCOUNTER — Other Ambulatory Visit: Payer: Medicare Other

## 2012-07-27 ENCOUNTER — Ambulatory Visit: Payer: Medicare Other

## 2012-07-28 ENCOUNTER — Other Ambulatory Visit: Payer: Self-pay | Admitting: *Deleted

## 2012-07-28 ENCOUNTER — Other Ambulatory Visit (HOSPITAL_BASED_OUTPATIENT_CLINIC_OR_DEPARTMENT_OTHER): Payer: Medicare Other | Admitting: Lab

## 2012-07-28 ENCOUNTER — Ambulatory Visit (HOSPITAL_BASED_OUTPATIENT_CLINIC_OR_DEPARTMENT_OTHER): Payer: Medicare Other

## 2012-07-28 VITALS — BP 143/80 | HR 60 | Temp 98.0°F

## 2012-07-28 DIAGNOSIS — Z5112 Encounter for antineoplastic immunotherapy: Secondary | ICD-10-CM

## 2012-07-28 DIAGNOSIS — E859 Amyloidosis, unspecified: Secondary | ICD-10-CM

## 2012-07-28 LAB — CBC WITH DIFFERENTIAL/PLATELET
Basophils Absolute: 0 10*3/uL (ref 0.0–0.1)
Eosinophils Absolute: 0.1 10*3/uL (ref 0.0–0.5)
HCT: 41.2 % (ref 38.4–49.9)
HGB: 13.8 g/dL (ref 13.0–17.1)
LYMPH%: 10.3 % — ABNORMAL LOW (ref 14.0–49.0)
MCV: 94.5 fL (ref 79.3–98.0)
MONO#: 0.3 10*3/uL (ref 0.1–0.9)
MONO%: 4.2 % (ref 0.0–14.0)
NEUT#: 5.6 10*3/uL (ref 1.5–6.5)
NEUT%: 84.1 % — ABNORMAL HIGH (ref 39.0–75.0)
Platelets: 190 10*3/uL (ref 140–400)
RBC: 4.36 10*6/uL (ref 4.20–5.82)
WBC: 6.6 10*3/uL (ref 4.0–10.3)

## 2012-07-28 MED ORDER — BORTEZOMIB CHEMO SQ INJECTION 3.5 MG (2.5MG/ML)
3.1000 mg | Freq: Once | INTRAMUSCULAR | Status: AC
  Administered 2012-07-28: 3 mg via SUBCUTANEOUS
  Filled 2012-07-28: qty 3

## 2012-07-28 MED ORDER — ONDANSETRON HCL 8 MG PO TABS
8.0000 mg | ORAL_TABLET | Freq: Once | ORAL | Status: AC
  Administered 2012-07-28: 8 mg via ORAL

## 2012-07-28 NOTE — Patient Instructions (Signed)
[facility] Discharge Instructions for Patients Receiving Chemotherapy  Today you received the following chemotherapy agents Velcade.  To help prevent nausea and vomiting after your treatment, we encourage you to take your nausea medication as prescribed.   If you develop nausea and vomiting that is not controlled by your nausea medication, call the clinic. If it is after clinic hours your family physician or the after hours number for the clinic or go to the Emergency Department.   BELOW ARE SYMPTOMS THAT SHOULD BE REPORTED IMMEDIATELY:  *FEVER GREATER THAN 100.5 F  *CHILLS WITH OR WITHOUT FEVER  NAUSEA AND VOMITING THAT IS NOT CONTROLLED WITH YOUR NAUSEA MEDICATION  *UNUSUAL SHORTNESS OF BREATH  *UNUSUAL BRUISING OR BLEEDING  TENDERNESS IN MOUTH AND THROAT WITH OR WITHOUT PRESENCE OF ULCERS  *URINARY PROBLEMS  *BOWEL PROBLEMS  UNUSUAL RASH Items with * indicate a potential emergency and should be followed up as soon as possible.  One of the nurses will contact you 24 hours after your treatment. Please let the nurse know about any problems that you may have experienced. Feel free to call the clinic you have any questions or concerns. The clinic phone number is [phone].   I have been informed and understand all the instructions given to me. I know to contact the clinic, my physician, or go to the Emergency Department if any problems should occur. I do not have any questions at this time, but understand that I may call the clinic during office hours   should I have any questions or need assistance in obtaining follow up care.    __________________________________________  _____________  __________ Signature of Patient or Authorized Representative            Date                   Time    __________________________________________ Nurse's Signature    

## 2012-07-28 NOTE — Telephone Encounter (Signed)
Received faxed  request from [facility] to refill Revlimid.   Office note from Dr. Gaylyn Rong 07/06/12 states that patient will restart Velcade and Revlimid both on 07/20/12 (21days on,7 days off).  Will give request to Raliegh Ip RN for Dr. Gaylyn Rong

## 2012-08-01 ENCOUNTER — Telehealth: Payer: Self-pay | Admitting: *Deleted

## 2012-08-01 NOTE — Telephone Encounter (Signed)
[facility] faxed refill request for Revlimid.  Request to MD for review. 

## 2012-08-01 NOTE — Progress Notes (Signed)
Patient began next cycle of Revlimid 5 mg tablets on 07/20/12; called [facility] [phone]) to inform that we will re-order med on 08/10/12.

## 2012-08-03 ENCOUNTER — Ambulatory Visit (HOSPITAL_BASED_OUTPATIENT_CLINIC_OR_DEPARTMENT_OTHER): Payer: Medicare Other

## 2012-08-03 ENCOUNTER — Other Ambulatory Visit (HOSPITAL_BASED_OUTPATIENT_CLINIC_OR_DEPARTMENT_OTHER): Payer: Medicare Other | Admitting: Lab

## 2012-08-03 VITALS — BP 127/66 | HR 60 | Temp 97.8°F | Resp 18

## 2012-08-03 DIAGNOSIS — E859 Amyloidosis, unspecified: Secondary | ICD-10-CM | POA: Diagnosis not present

## 2012-08-03 DIAGNOSIS — Z5112 Encounter for antineoplastic immunotherapy: Secondary | ICD-10-CM | POA: Diagnosis not present

## 2012-08-03 LAB — CBC WITH DIFFERENTIAL/PLATELET
Basophils Absolute: 0 10*3/uL (ref 0.0–0.1)
EOS%: 0.3 % (ref 0.0–7.0)
Eosinophils Absolute: 0 10*3/uL (ref 0.0–0.5)
HCT: 42.7 % (ref 38.4–49.9)
HGB: 14.1 g/dL (ref 13.0–17.1)
LYMPH%: 6.2 % — ABNORMAL LOW (ref 14.0–49.0)
MCH: 32.3 pg (ref 27.2–33.4)
MCV: 97.6 fL (ref 79.3–98.0)
MONO%: 5 % (ref 0.0–14.0)
NEUT#: 9 10*3/uL — ABNORMAL HIGH (ref 1.5–6.5)
NEUT%: 88.1 % — ABNORMAL HIGH (ref 39.0–75.0)
Platelets: 140 10*3/uL (ref 140–400)
RDW: 14.7 % — ABNORMAL HIGH (ref 11.0–14.6)

## 2012-08-03 MED ORDER — BORTEZOMIB CHEMO SQ INJECTION 3.5 MG (2.5MG/ML)
3.1000 mg | Freq: Once | INTRAMUSCULAR | Status: AC
  Administered 2012-08-03: 3 mg via SUBCUTANEOUS
  Filled 2012-08-03: qty 3

## 2012-08-03 MED ORDER — ONDANSETRON HCL 8 MG PO TABS
8.0000 mg | ORAL_TABLET | Freq: Once | ORAL | Status: AC
  Administered 2012-08-03: 8 mg via ORAL

## 2012-08-03 NOTE — Patient Instructions (Signed)
[facility] Discharge Instructions for Patients Receiving Chemotherapy  Today you received the following chemotherapy agents Velcade  To help prevent nausea and vomiting after your treatment, we encourage you to take your nausea medication  Begin taking it at 7 pm and take it as often as prescribed for the next 24 to 72 hours.   If you develop nausea and vomiting that is not controlled by your nausea medication, call the clinic. If it is after clinic hours your family physician or the after hours number for the clinic or go to the Emergency Department.   BELOW ARE SYMPTOMS THAT SHOULD BE REPORTED IMMEDIATELY:  *FEVER GREATER THAN 100.5 F  *CHILLS WITH OR WITHOUT FEVER  NAUSEA AND VOMITING THAT IS NOT CONTROLLED WITH YOUR NAUSEA MEDICATION  *UNUSUAL SHORTNESS OF BREATH  *UNUSUAL BRUISING OR BLEEDING  TENDERNESS IN MOUTH AND THROAT WITH OR WITHOUT PRESENCE OF ULCERS  *URINARY PROBLEMS  *BOWEL PROBLEMS  UNUSUAL RASH Items with * indicate a potential emergency and should be followed up as soon as possible.  One of the nurses will contact you 24 hours after your treatment. Please let the nurse know about any problems that you may have experienced. Feel free to call the clinic you have any questions or concerns. The clinic phone number is [phone].   I have been informed and understand all the instructions given to me. I know to contact the clinic, my physician, or go to the Emergency Department if any problems should occur. I do not have any questions at this time, but understand that I may call the clinic during office hours   should I have any questions or need assistance in obtaining follow up care.    __________________________________________  _____________  __________ Signature of Patient or Authorized Representative            Date                   Time    __________________________________________ Nurse's Signature    

## 2012-08-04 LAB — KAPPA/LAMBDA LIGHT CHAINS
Kappa:Lambda Ratio: 1.8 — ABNORMAL HIGH (ref 0.26–1.65)
Lambda Free Lght Chn: 2.11 mg/dL (ref 0.57–2.63)

## 2012-08-04 LAB — COMPREHENSIVE METABOLIC PANEL
AST: 14 U/L (ref 0–37)
Albumin: 3.5 g/dL (ref 3.5–5.2)
Alkaline Phosphatase: 83 U/L (ref 39–117)
BUN: 18 mg/dL (ref 6–23)
Creatinine, Ser: 1.15 mg/dL (ref 0.50–1.35)
Glucose, Bld: 91 mg/dL (ref 70–99)
Total Bilirubin: 0.6 mg/dL (ref 0.3–1.2)

## 2012-08-16 DIAGNOSIS — Z006 Encounter for examination for normal comparison and control in clinical research program: Secondary | ICD-10-CM | POA: Diagnosis not present

## 2012-08-16 DIAGNOSIS — E8589 Other amyloidosis: Secondary | ICD-10-CM | POA: Diagnosis not present

## 2012-08-16 DIAGNOSIS — M549 Dorsalgia, unspecified: Secondary | ICD-10-CM | POA: Diagnosis not present

## 2012-08-16 DIAGNOSIS — Z79899 Other long term (current) drug therapy: Secondary | ICD-10-CM | POA: Diagnosis not present

## 2012-08-17 ENCOUNTER — Ambulatory Visit: Payer: Medicare Other

## 2012-08-17 ENCOUNTER — Ambulatory Visit: Payer: Medicare Other | Admitting: Oncology

## 2012-08-17 ENCOUNTER — Other Ambulatory Visit: Payer: Medicare Other | Admitting: Lab

## 2012-08-18 ENCOUNTER — Other Ambulatory Visit: Payer: Self-pay | Admitting: Oncology

## 2012-08-24 ENCOUNTER — Other Ambulatory Visit: Payer: Self-pay | Admitting: *Deleted

## 2012-08-24 ENCOUNTER — Telehealth: Payer: Self-pay | Admitting: Oncology

## 2012-08-24 ENCOUNTER — Ambulatory Visit (HOSPITAL_BASED_OUTPATIENT_CLINIC_OR_DEPARTMENT_OTHER): Payer: Medicare Other

## 2012-08-24 ENCOUNTER — Other Ambulatory Visit (HOSPITAL_BASED_OUTPATIENT_CLINIC_OR_DEPARTMENT_OTHER): Payer: Medicare Other | Admitting: Lab

## 2012-08-24 VITALS — BP 136/76 | HR 62 | Temp 97.6°F

## 2012-08-24 DIAGNOSIS — Z5112 Encounter for antineoplastic immunotherapy: Secondary | ICD-10-CM

## 2012-08-24 DIAGNOSIS — D72829 Elevated white blood cell count, unspecified: Secondary | ICD-10-CM

## 2012-08-24 DIAGNOSIS — E859 Amyloidosis, unspecified: Secondary | ICD-10-CM | POA: Diagnosis not present

## 2012-08-24 LAB — CBC WITH DIFFERENTIAL/PLATELET
BASO%: 3.5 % — ABNORMAL HIGH (ref 0.0–2.0)
EOS%: 0.6 % (ref 0.0–7.0)
HCT: 44.5 % (ref 38.4–49.9)
MCH: 32.6 pg (ref 27.2–33.4)
MCHC: 33.2 g/dL (ref 32.0–36.0)
NEUT%: 75.1 % — ABNORMAL HIGH (ref 39.0–75.0)
RDW: 15.1 % — ABNORMAL HIGH (ref 11.0–14.6)
lymph#: 0.9 10*3/uL (ref 0.9–3.3)

## 2012-08-24 MED ORDER — ONDANSETRON HCL 8 MG PO TABS
8.0000 mg | ORAL_TABLET | Freq: Once | ORAL | Status: AC
  Administered 2012-08-24: 8 mg via ORAL

## 2012-08-24 MED ORDER — BORTEZOMIB CHEMO SQ INJECTION 3.5 MG (2.5MG/ML)
3.1000 mg | Freq: Once | INTRAMUSCULAR | Status: AC
  Administered 2012-08-24: 3 mg via SUBCUTANEOUS
  Filled 2012-08-24: qty 3

## 2012-08-24 MED ORDER — LENALIDOMIDE 5 MG PO CAPS: 5.0000 mg | ORAL_CAPSULE | Freq: Every day | ORAL | Status: DC

## 2012-08-24 NOTE — Telephone Encounter (Signed)
Informed pt of refill for Revlimid and inquired why pt did not call last week when he was due to restart.  Pt states was waiting to hear from [place] or [facility].  Instructed pt to call us if he does not hear about his refill several days before it is due, not one week afterwards.  Pt then said he wasn't sure he was going to need this cycle because [facility] was talking about starting him on a new clinical trial, but they have decided for now to continue current treatment.  Instructed pt to call [employer] and do pt survey for Authorization number to be valid.  He verbalized understanding.  Faxed Refill to [facility]. [id number]#[id number].

## 2012-08-24 NOTE — Telephone Encounter (Signed)
gv pt appt schedule for September. Pt to f/u next wk w/TX due to f/u for today not added in time ([facility] aware). S/w Cameo and she will call in refill for revlimid per pt request. Pt aware.

## 2012-08-24 NOTE — Patient Instructions (Signed)
[facility] Discharge Instructions for Patients Receiving Chemotherapy  Today you received the following chemotherapy agents Velcade.  To help prevent nausea and vomiting after your treatment, we encourage you to take your nausea medication as prescribed.   If you develop nausea and vomiting that is not controlled by your nausea medication, call the clinic. If it is after clinic hours your family physician or the after hours number for the clinic or go to the Emergency Department.   BELOW ARE SYMPTOMS THAT SHOULD BE REPORTED IMMEDIATELY:  *FEVER GREATER THAN 100.5 F  *CHILLS WITH OR WITHOUT FEVER  NAUSEA AND VOMITING THAT IS NOT CONTROLLED WITH YOUR NAUSEA MEDICATION  *UNUSUAL SHORTNESS OF BREATH  *UNUSUAL BRUISING OR BLEEDING  TENDERNESS IN MOUTH AND THROAT WITH OR WITHOUT PRESENCE OF ULCERS  *URINARY PROBLEMS  *BOWEL PROBLEMS  UNUSUAL RASH Items with * indicate a potential emergency and should be followed up as soon as possible.  One of the nurses will contact you 24 hours after your treatment. Please let the nurse know about any problems that you may have experienced. Feel free to call the clinic you have any questions or concerns. The clinic phone number is [phone].   I have been informed and understand all the instructions given to me. I know to contact the clinic, my physician, or go to the Emergency Department if any problems should occur. I do not have any questions at this time, but understand that I may call the clinic during office hours   should I have any questions or need assistance in obtaining follow up care.    __________________________________________  _____________  __________ Signature of Patient or Authorized Representative            Date                   Time    __________________________________________ Nurse's Signature    

## 2012-08-28 ENCOUNTER — Encounter: Payer: Self-pay | Admitting: *Deleted

## 2012-08-28 NOTE — Progress Notes (Signed)
Fax received from [facility], they shipped pt's Revlimid on 08/25/12 for delivery today.

## 2012-08-31 ENCOUNTER — Telehealth: Payer: Self-pay | Admitting: Oncology

## 2012-08-31 ENCOUNTER — Ambulatory Visit (HOSPITAL_BASED_OUTPATIENT_CLINIC_OR_DEPARTMENT_OTHER): Payer: Medicare Other | Admitting: Oncology

## 2012-08-31 ENCOUNTER — Ambulatory Visit (HOSPITAL_BASED_OUTPATIENT_CLINIC_OR_DEPARTMENT_OTHER): Payer: Medicare Other

## 2012-08-31 ENCOUNTER — Other Ambulatory Visit (HOSPITAL_BASED_OUTPATIENT_CLINIC_OR_DEPARTMENT_OTHER): Payer: Medicare Other

## 2012-08-31 VITALS — BP 136/71 | HR 66 | Temp 96.7°F | Resp 20 | Ht 72.0 in | Wt 271.5 lb

## 2012-08-31 DIAGNOSIS — Z5112 Encounter for antineoplastic immunotherapy: Secondary | ICD-10-CM | POA: Diagnosis not present

## 2012-08-31 DIAGNOSIS — D72829 Elevated white blood cell count, unspecified: Secondary | ICD-10-CM

## 2012-08-31 DIAGNOSIS — M549 Dorsalgia, unspecified: Secondary | ICD-10-CM | POA: Diagnosis not present

## 2012-08-31 DIAGNOSIS — G8929 Other chronic pain: Secondary | ICD-10-CM

## 2012-08-31 DIAGNOSIS — E859 Amyloidosis, unspecified: Secondary | ICD-10-CM | POA: Diagnosis not present

## 2012-08-31 DIAGNOSIS — R609 Edema, unspecified: Secondary | ICD-10-CM | POA: Diagnosis not present

## 2012-08-31 LAB — CBC WITH DIFFERENTIAL/PLATELET
BASO%: 0.1 % (ref 0.0–2.0)
Basophils Absolute: 0 10*3/uL (ref 0.0–0.1)
EOS%: 0 % (ref 0.0–7.0)
HGB: 14.3 g/dL (ref 13.0–17.1)
MCH: 32.4 pg (ref 27.2–33.4)
RDW: 15.1 % — ABNORMAL HIGH (ref 11.0–14.6)
lymph#: 0.7 10*3/uL — ABNORMAL LOW (ref 0.9–3.3)

## 2012-08-31 LAB — COMPREHENSIVE METABOLIC PANEL (CC13)
ALT: 16 U/L (ref 0–55)
AST: 12 U/L (ref 5–34)
Alkaline Phosphatase: 105 U/L (ref 40–150)
Creatinine: 1 mg/dL (ref 0.7–1.3)
Total Bilirubin: 0.7 mg/dL (ref 0.20–1.20)

## 2012-08-31 MED ORDER — LEVOFLOXACIN 500 MG PO TABS: 500.0000 mg | ORAL_TABLET | Freq: Every day | ORAL | Status: DC

## 2012-08-31 MED ORDER — DEXAMETHASONE 2 MG PO TABS: ORAL_TABLET | ORAL | Status: DC

## 2012-08-31 MED ORDER — GABAPENTIN 300 MG PO CAPS: 300.0000 mg | ORAL_CAPSULE | Freq: Two times a day (BID) | ORAL | Status: DC

## 2012-08-31 MED ORDER — ONDANSETRON HCL 8 MG PO TABS
8.0000 mg | ORAL_TABLET | Freq: Once | ORAL | Status: AC
  Administered 2012-08-31: 8 mg via ORAL

## 2012-08-31 MED ORDER — BORTEZOMIB CHEMO SQ INJECTION 3.5 MG (2.5MG/ML)
3.1000 mg | Freq: Once | INTRAMUSCULAR | Status: AC
  Administered 2012-08-31: 3 mg via SUBCUTANEOUS
  Filled 2012-08-31: qty 1.2

## 2012-08-31 NOTE — Telephone Encounter (Signed)
Printed and gv pt appt schedule. °

## 2012-09-01 ENCOUNTER — Telehealth: Payer: Self-pay | Admitting: *Deleted

## 2012-09-01 ENCOUNTER — Encounter: Payer: Self-pay | Admitting: Oncology

## 2012-09-01 LAB — KAPPA/LAMBDA LIGHT CHAINS
Kappa free light chain: 3.19 mg/dL — ABNORMAL HIGH (ref 0.33–1.94)
Kappa:Lambda Ratio: 2.08 — ABNORMAL HIGH (ref 0.26–1.65)
Lambda Free Lght Chn: 1.53 mg/dL (ref 0.57–2.63)

## 2012-09-01 MED ORDER — BENZONATATE 100 MG PO CAPS: 100.0000 mg | ORAL_CAPSULE | Freq: Three times a day (TID) | ORAL | Status: DC | PRN

## 2012-09-01 NOTE — Telephone Encounter (Signed)
Pt called requests Rx for cough suppressant.  States "I am coughing my head off."  He started Levaquin as ordered by Clenton Pare, NP and he also taking robitussin for coughing, but no relief.  Ordered Tessalon Perles per [place] and instructed pt on taking q 8 hrs prn cough.  Instructed may also continue robitussion otc as directed.  He verbalized understanding.

## 2012-09-01 NOTE — Progress Notes (Signed)
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 Negative cytogenetics and FISH myeloma panel. Cardiac MRI showed no sign of cardiac dysfunction.   PAST TREATMENT: Velcade, dexamethasone, melphalan x 3 cycles. 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   CURRENT THERAPY: started salvage Velcade for persistent serum free light chain on 09/17/2010 (3 weeks on, 1 week off). His regimen was modified to include Revlimid 5mg  PO d1-21 every 4 weeks and Dexamathasone 10mg  PO qweek in April 2013 to due slightly elevated light chain.   INTERVAL HISTORY: Johnny Mitchell 71 y.o. male returns for regular follow up.  He has had cold symptoms for 2-3 days. He reports a runny nose, sore throat, and cough. Denies fevers. He still has right leg numbness; and thus, she added on gabapentin 300mg  PO daily.  He has tried taking this twice a day on occasion and think the higher dose has helped; he would like to increase his dose.  He denied side effects of this chemo regimen. He has stable bilateral leg swelling which he takes Lasix for with control of edema.   Patient denies fever, anorexia, weight loss, fatigue, headache, visual changes, confusion, drenching night sweats, palpable lymph node swelling, mucositis, odynophagia, dysphagia, nausea vomiting, jaundice, chest pain, palpitation, shortness of breath, dyspnea on exertion, productive cough, gum bleeding, epistaxis, hematemesis, hemoptysis, abdominal pain, abdominal swelling, early satiety, melena, hematochezia, hematuria, skin rash, spontaneous bleeding, joint swelling, joint pain, heat or cold intolerance, bowel bladder incontinence, back pain, focal motor weakness, paresthesia,  depression, suicidal or homocidal ideation, feeling hopelessness.   Past Medical History  Diagnosis Date  . Hypertension   . Coronary atherosclerosis of unspecified type of vessel, native or graft   . Cerebrovascular disease, unspecified   . Pure hypercholesterolemia   . Diverticulosis of colon (without mention of hemorrhage)   . Benign neoplasm of colon   . Unspecified hemorrhoids without mention of complication   . Degenerative disc disease   . 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 Low back pain   . Hemangioma   . Amyloidosis   . LBBB (left bundle branch block)   . 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 OSA (obstructive sleep apnea)   . Pedal edema 05/11/2012    Past Surgical History  Procedure Date  . Knee arthroscopy   . Carpal tunnel release 08/2006    Dr Thad Ranger at [facility] - Bilateral  . Bone marrow transplant june 2011    Current Outpatient Prescriptions  Medication Sig Dispense Refill  . acyclovir (ZOVIRAX) 400 MG tablet Take 400 mg by mouth daily.       Marland Kitchen aspirin 81 MG tablet Take 81 mg by mouth daily.      Marland Kitchen atenolol (TENORMIN) 100 MG tablet Take 1 tablet (100 mg total) by mouth daily.  90 tablet  3  . benzonatate (TESSALON) 100 MG capsule Take 1 capsule (100 mg total) by mouth 3 (three) times daily as needed for cough.  30 capsule  0  . dexamethasone (DECADRON) 2 MG tablet Take 3 tablets (6 mg total) by mouth once a week.  100 tablet  3  . dexamethasone (DECADRON) 2 MG tablet  Take 5 tabs by mouth once a week on day of chemo  20 tablet  5  . furosemide (LASIX) 40 MG tablet Take 1 tablet (40 mg total) by mouth 2 (two) times daily. Once or twice a day  120 tablet  3  . gabapentin (NEURONTIN) 300 MG capsule Take 1 capsule (300 mg total) by mouth 2 (two) times daily.  60 capsule  2  . GABAPENTIN PO Take 300 mg by mouth at bedtime.       Marland Kitchen lenalidomide (REVLIMID) 5 MG capsule Take 1 capsule (5 mg total) by mouth  daily.  21 capsule  0  . levofloxacin (LEVAQUIN) 500 MG tablet Take 1 tablet (500 mg total) by mouth daily.  7 tablet  0  . DISCONTD: furosemide (LASIX) 40 MG tablet Take 1 tablet (40 mg total) by mouth 2 (two) times daily.  60 tablet  3   No current facility-administered medications for this visit.   Facility-Administered Medications Ordered in Other Visits  Medication Dose Route Frequency Provider Last Rate Last Dose  . bortezomib SQ (VELCADE) chemo injection 3 mg  3 mg Subcutaneous Once Exie Parody, MD   3 mg at 08/31/12 1458  . ondansetron (ZOFRAN) tablet 8 mg  8 mg Oral Once Exie Parody, MD   8 mg at 08/31/12 1441    ALLERGIES:   has no known allergies.  REVIEW OF SYSTEMS:  The rest of the 14-point review of system was negative.   Filed Vitals:   08/31/12 1410  BP: 136/71  Pulse: 66  Temp: 96.7 F (35.9 C)  Resp: 20   Wt Readings from Last 3 Encounters:  08/31/12 271 lb 8 oz (123.152 kg)  07/06/12 264 lb 4.8 oz (119.886 kg)  06/08/12 267 lb 11.2 oz (121.428 kg)   ECOG Performance status: 0-1  PHYSICAL EXAMINATION:   General: mildly obese man in no acute distress. Eyes: no scleral icterus. He had bruises in his bilateral upper eyelids. ENT: no dental indentation on the tongue. ENT: There were no oropharyngeal lesions. Neck was without thyromegaly. Lymphatics: Negative cervical, supraclavicular or axillary adenopathy. Respiratory: lungs were clear bilaterally without wheezing or crackles. Cardiovascular: Regular rate and rhythm, S1/S2, without murmur, rub or gallop. He has 1+ bilateral pedal edema to mid shin GI: abdomen was soft, flat, nontender, nondistended, without organomegaly. Muscoloskeletal: no spinal tenderness of palpation of vertebral spine. Skin exam showed some echymosis on his extensor surfaces of bilateral arms from his dog's scratching him. Neuro exam was nonfocal. Patient was able to get on and off exam table without assistance. Gait was normal. Patient was alerted  and oriented. Attention was good. Language was appropriate. Mood was normal without depression. Speech was not pressured. Thought content was not tangential.    LABORATORY/RADIOLOGY DATA:  Lab Results  Component Value Date   WBC 9.0 08/31/2012   HGB 14.3 08/31/2012   HCT 43.3 08/31/2012   PLT 147 08/31/2012   GLUCOSE 129* 08/31/2012   CHOL 155 01/15/2009   TRIG 77 01/15/2009   HDL 39.9 01/15/2009   LDLCALC 100* 01/15/2009   ALKPHOS 105 08/31/2012   ALT 16 08/31/2012   AST 12 08/31/2012   NA 142 08/31/2012   K 4.2 08/31/2012   CL 106 08/31/2012   CREATININE 1.0 08/31/2012   BUN 18.0 08/31/2012   CO2 28 08/31/2012   PSA 0.81 01/15/2009   INR 1.02 09/30/2011   INR 1.02 09/30/2011     ASSESSMENT AND PLAN:   1.  History of amyloidosis: He has been on maintenance Velcade with the addition of Revlimid and Dexamethasone in April 2013. He has been doing well on this chemo regimen.  There is some right leg neuropathy which is most likely from radiculopathy and less likely from chemo.  Regardless, he is on gabapentin.  Recommend that he continue Velcade/Dexamethasone/Revlimid without dose modification. SPEP and light chains are pending today. 2. DOE: Symptoms controlled with Lasix. He is euvolumic on exam today.  3. Right leg/foot numbness:  Due to radiculopathy; less likely from chemo.  He is no gabapentin 300mg  PO qhs currently. Will increase to BID per his request. 4. Pedal edema. Possibly secondary to venous stasis vs right sided heart failure from OSA. It's stable today without much symptoms. He is on Lasix BID.  5. Hypertension. Well controlled on atenolol and Lasix per Dr. Jens Som. 6. Chronic lower back pain: recent operation with Ortho from [place] with significant improvement. He is on oxycone qday prn.  7. Cold symptoms: Explained to patient that this is likely viral and will need to run it's course. He is concerned that he may feel worse over the weekend. I have given a prescription for Levaquin to  fill if his symptoms worsen or he develops fevers. I recommended that he hold chemotherapy until his symptoms improve, but he refused to do this. 8. Follow up: Weekly CBC and Velcade SQ (3wks on, 1 wk off).  Visit on day one of next cycle.     The length of time of the face-to-face encounter was 30 minutes. More than 50% of time was spent counseling and coordination of care.

## 2012-09-02 ENCOUNTER — Other Ambulatory Visit: Payer: Self-pay | Admitting: Oncology

## 2012-09-07 ENCOUNTER — Ambulatory Visit (HOSPITAL_BASED_OUTPATIENT_CLINIC_OR_DEPARTMENT_OTHER): Payer: Medicare Other

## 2012-09-07 ENCOUNTER — Other Ambulatory Visit (HOSPITAL_BASED_OUTPATIENT_CLINIC_OR_DEPARTMENT_OTHER): Payer: Medicare Other

## 2012-09-07 VITALS — BP 148/66 | HR 64 | Temp 97.4°F

## 2012-09-07 DIAGNOSIS — E859 Amyloidosis, unspecified: Secondary | ICD-10-CM

## 2012-09-07 DIAGNOSIS — Z5112 Encounter for antineoplastic immunotherapy: Secondary | ICD-10-CM

## 2012-09-07 LAB — CBC WITH DIFFERENTIAL/PLATELET
Basophils Absolute: 0 10*3/uL (ref 0.0–0.1)
Eosinophils Absolute: 0.3 10*3/uL (ref 0.0–0.5)
HGB: 14.3 g/dL (ref 13.0–17.1)
MCV: 97.6 fL (ref 79.3–98.0)
MONO#: 1.3 10*3/uL — ABNORMAL HIGH (ref 0.1–0.9)
MONO%: 18 % — ABNORMAL HIGH (ref 0.0–14.0)
NEUT#: 4.5 10*3/uL (ref 1.5–6.5)
RBC: 4.53 10*6/uL (ref 4.20–5.82)
RDW: 14.1 % (ref 11.0–14.6)
WBC: 7.3 10*3/uL (ref 4.0–10.3)
nRBC: 0 % (ref 0–0)

## 2012-09-07 MED ORDER — BORTEZOMIB CHEMO SQ INJECTION 3.5 MG (2.5MG/ML)
3.1000 mg | Freq: Once | INTRAMUSCULAR | Status: AC
  Administered 2012-09-07: 3 mg via SUBCUTANEOUS
  Filled 2012-09-07: qty 3

## 2012-09-07 MED ORDER — ONDANSETRON HCL 8 MG PO TABS
8.0000 mg | ORAL_TABLET | Freq: Once | ORAL | Status: AC
  Administered 2012-09-07: 8 mg via ORAL

## 2012-09-14 ENCOUNTER — Encounter: Payer: Self-pay | Admitting: *Deleted

## 2012-09-14 NOTE — Progress Notes (Signed)
RECEIVED A FAX FROM [facility] CONCERNING A CONFIRMATION OF FACSIMILE RECEIPT FOR PT.'S REFERRAL. 

## 2012-09-21 ENCOUNTER — Telehealth: Payer: Self-pay | Admitting: *Deleted

## 2012-09-21 ENCOUNTER — Telehealth: Payer: Self-pay | Admitting: Oncology

## 2012-09-21 ENCOUNTER — Ambulatory Visit (HOSPITAL_BASED_OUTPATIENT_CLINIC_OR_DEPARTMENT_OTHER): Payer: Medicare Other

## 2012-09-21 ENCOUNTER — Other Ambulatory Visit (HOSPITAL_BASED_OUTPATIENT_CLINIC_OR_DEPARTMENT_OTHER): Payer: Medicare Other

## 2012-09-21 ENCOUNTER — Ambulatory Visit (HOSPITAL_BASED_OUTPATIENT_CLINIC_OR_DEPARTMENT_OTHER): Payer: Medicare Other | Admitting: Oncology

## 2012-09-21 VITALS — BP 132/75 | HR 67 | Temp 97.0°F | Resp 20 | Ht 72.0 in | Wt 273.2 lb

## 2012-09-21 DIAGNOSIS — E859 Amyloidosis, unspecified: Secondary | ICD-10-CM

## 2012-09-21 DIAGNOSIS — Z5112 Encounter for antineoplastic immunotherapy: Secondary | ICD-10-CM | POA: Diagnosis not present

## 2012-09-21 DIAGNOSIS — Z23 Encounter for immunization: Secondary | ICD-10-CM

## 2012-09-21 DIAGNOSIS — G589 Mononeuropathy, unspecified: Secondary | ICD-10-CM | POA: Diagnosis not present

## 2012-09-21 LAB — CBC WITH DIFFERENTIAL/PLATELET
Basophils Absolute: 0 10*3/uL (ref 0.0–0.1)
Eosinophils Absolute: 0 10*3/uL (ref 0.0–0.5)
HCT: 43.8 % (ref 38.4–49.9)
HGB: 14.5 g/dL (ref 13.0–17.1)
LYMPH%: 6.6 % — ABNORMAL LOW (ref 14.0–49.0)
MCHC: 33 g/dL (ref 32.0–36.0)
MONO#: 0.2 10*3/uL (ref 0.1–0.9)
NEUT#: 9 10*3/uL — ABNORMAL HIGH (ref 1.5–6.5)
NEUT%: 90.9 % — ABNORMAL HIGH (ref 39.0–75.0)
Platelets: 208 10*3/uL (ref 140–400)
WBC: 9.9 10*3/uL (ref 4.0–10.3)
lymph#: 0.7 10*3/uL — ABNORMAL LOW (ref 0.9–3.3)

## 2012-09-21 LAB — COMPREHENSIVE METABOLIC PANEL (CC13)
ALT: 18 U/L (ref 0–55)
CO2: 24 mEq/L (ref 22–29)
Calcium: 8.6 mg/dL (ref 8.4–10.4)
Chloride: 104 mEq/L (ref 98–107)
Creatinine: 1 mg/dL (ref 0.7–1.3)
Glucose: 155 mg/dl — ABNORMAL HIGH (ref 70–99)
Total Bilirubin: 0.4 mg/dL (ref 0.20–1.20)

## 2012-09-21 MED ORDER — ONDANSETRON HCL 8 MG PO TABS
8.0000 mg | ORAL_TABLET | Freq: Once | ORAL | Status: AC
  Administered 2012-09-21: 8 mg via ORAL

## 2012-09-21 MED ORDER — INFLUENZA VIRUS VACC SPLIT PF IM SUSP
0.5000 mL | Freq: Once | INTRAMUSCULAR | Status: AC
  Administered 2012-09-21: 0.5 mL via INTRAMUSCULAR
  Filled 2012-09-21: qty 0.5

## 2012-09-21 MED ORDER — BORTEZOMIB CHEMO SQ INJECTION 3.5 MG (2.5MG/ML)
3.1000 mg | Freq: Once | INTRAMUSCULAR | Status: AC
  Administered 2012-09-21: 3 mg via SUBCUTANEOUS
  Filled 2012-09-21: qty 3

## 2012-09-21 NOTE — Patient Instructions (Addendum)
[facility] Discharge Instructions for Patients Receiving Chemotherapy  Today you received the following chemotherapy agents velcade  To help prevent nausea and vomiting after your treatment, we encourage you to take your nausea medication and take it as often as prescribed   If you develop nausea and vomiting that is not controlled by your nausea medication, call the clinic. If it is after clinic hours your family physician or the after hours number for the clinic or go to the Emergency Department.   BELOW ARE SYMPTOMS THAT SHOULD BE REPORTED IMMEDIATELY:  *FEVER GREATER THAN 100.5 F  *CHILLS WITH OR WITHOUT FEVER  NAUSEA AND VOMITING THAT IS NOT CONTROLLED WITH YOUR NAUSEA MEDICATION  *UNUSUAL SHORTNESS OF BREATH  *UNUSUAL BRUISING OR BLEEDING  TENDERNESS IN MOUTH AND THROAT WITH OR WITHOUT PRESENCE OF ULCERS  *URINARY PROBLEMS  *BOWEL PROBLEMS  UNUSUAL RASH Items with * indicate a potential emergency and should be followed up as soon as possible.  One of the nurses will contact you 24 hours after your treatment. Please let the nurse know about any problems that you may have experienced. Feel free to call the clinic you have any questions or concerns. The clinic phone number is [phone].   I have been informed and understand all the instructions given to me. I know to contact the clinic, my physician, or go to the Emergency Department if any problems should occur. I do not have any questions at this time, but understand that I may call the clinic during office hours   should I have any questions or need assistance in obtaining follow up care.    __________________________________________  _____________  __________ Signature of Patient or Authorized Representative            Date                   Time    __________________________________________ Nurse's Signature    

## 2012-09-21 NOTE — Telephone Encounter (Signed)
Per staff phone call and POF I have scheduled appts. JMW  

## 2012-09-21 NOTE — Patient Instructions (Addendum)
1.  Diagnosis:  Amloidoisis. 2.  Treatment:  On maintenance chemo Velcade SQ injection once a week; 3 weeks on, 1 week off.  Revlimid 5mg  pill days 1-21 of every 4 weeks.  Dexamethasone 5 of the 2mg  tab (10mg  total) once a week.  3.  Follow up:  Weekly lab (CBC) and Velcade injection.  Return visit with Belenda Cruise in about 2 months.

## 2012-09-21 NOTE — Progress Notes (Signed)
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 Negative cytogenetics and FISH myeloma panel. Cardiac MRI showed no sign of cardiac dysfunction.   PAST TREATMENT: Velcade, dexamethasone, melphalan x 3 cycles. 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   CURRENT THERAPY: started salvage Velcade for persistent serum free light chain on 09/17/2010 (3 weeks on, 1 week off). His regimen was modified to include Revlimid 5mg  PO d1-21 every 4 weeks and Dexamathasone 10mg  PO qweek in April 2013 to due slightly elevated light chain.    INTERVAL HISTORY: Johnny Mitchell 71 y.o. male returns for regular follow up.  He reports feeling stable.  He has mild DOE walking up a few flight of stairs.  He has bilateral pedal edema which improves with elevation of his legs at night.  He denied chest pain, orthopnea, PND, chest pain, palpitation.  He is tolerating Velcade/Rev/Dex without neuropathy, fatigue, bleeding, fever, recurrent infection.    Past Medical History  Diagnosis Date  . Hypertension   . Coronary atherosclerosis of unspecified type of vessel, native or graft   . Cerebrovascular disease, unspecified   . Pure hypercholesterolemia   . Diverticulosis of colon (without mention of hemorrhage)   . Benign neoplasm of colon   . Unspecified hemorrhoids without mention of complication   . Degenerative disc disease   . 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 Low back pain   . Hemangioma   . Amyloidosis   . LBBB (left bundle branch block)   . 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 OSA (obstructive sleep apnea)   . Pedal edema 05/11/2012    Past Surgical History    Procedure Date  . Knee arthroscopy   . Carpal tunnel release 08/2006    Dr Thad Ranger at [facility] - Bilateral  . Bone marrow transplant june 2011    Current Outpatient Prescriptions  Medication Sig Dispense Refill  . acyclovir (ZOVIRAX) 400 MG tablet Take 400 mg by mouth daily.       Marland Kitchen atenolol (TENORMIN) 100 MG tablet Take 1 tablet (100 mg total) by mouth daily.  90 tablet  3  . dexamethasone (DECADRON) 2 MG tablet Take 3 tablets (6 mg total) by mouth once a week.  100 tablet  3  . dexamethasone (DECADRON) 2 MG tablet Take 5 tabs by mouth once a week on day of chemo  20 tablet  5  . furosemide (LASIX) 40 MG tablet Take 1 tablet (40 mg total) by mouth 2 (two) times daily. Once or twice a day  120 tablet  3  . gabapentin (NEURONTIN) 300 MG capsule Take 1 capsule (300 mg total) by mouth 2 (two) times daily.  60 capsule  2  . lenalidomide (REVLIMID) 5 MG capsule Take 1 capsule (5 mg total) by mouth daily.  21 capsule  0  . DISCONTD: furosemide (LASIX) 40 MG tablet Take 1 tablet (40 mg total) by mouth 2 (two) times daily.  60 tablet  3  . DISCONTD: GABAPENTIN PO Take 300 mg by mouth at bedtime.        Current Facility-Administered Medications  Medication Dose Route Frequency Provider Last Rate Last Dose  . influenza  inactive virus vaccine (FLUZONE/FLUARIX) injection 0.5 mL  0.5 mL Intramuscular Once Exie Parody, MD        ALLERGIES:   has no known allergies.  REVIEW OF SYSTEMS:  The rest of the 14-point review of system was negative.   Filed Vitals:   09/21/12 1202  BP: 132/75  Pulse: 67  Temp: 97 F (36.1 C)  Resp: 20   Wt Readings from Last 3 Encounters:  09/21/12 273 lb 3.2 oz (123.923 kg)  08/31/12 271 lb 8 oz (123.152 kg)  07/06/12 264 lb 4.8 oz (119.886 kg)   ECOG Performance status: 1  PHYSICAL EXAMINATION:   General:  Mildly obese man, in no acute distress.  Eyes:  no scleral icterus.  ENT:  There were no oropharyngeal lesions.  Neck was without thyromegaly.  Lymphatics:   Negative cervical, supraclavicular or axillary adenopathy.  Respiratory: lungs were clear bilaterally without wheezing or crackles.  Cardiovascular:  Regular rate and rhythm, S1/S2, without murmur, rub or gallop.  There was 1+ bilateral pedal edema.  GI:  abdomen was soft, flat, nontender, nondistended, without organomegaly.  Muscoloskeletal:  no spinal tenderness of palpation of vertebral spine.  Skin exam was without echymosis, petichae.  Neuro exam was nonfocal.  Patient was able to get on and off exam table without assistance.  Gait was normal.  Patient was alerted and oriented.  Attention was good.   Language was appropriate.  Mood was normal without depression.  Speech was not pressured.  Thought content was not tangential.      LABORATORY/RADIOLOGY DATA:  Lab Results  Component Value Date   WBC 9.9 09/21/2012   HGB 14.5 09/21/2012   HCT 43.8 09/21/2012   PLT 208 09/21/2012   GLUCOSE 129* 08/31/2012   CHOL 155 01/15/2009   TRIG 77 01/15/2009   HDL 39.9 01/15/2009   LDLCALC 100* 01/15/2009   ALKPHOS 105 08/31/2012   ALT 16 08/31/2012   AST 12 08/31/2012   NA 142 08/31/2012   K 4.2 08/31/2012   CL 106 08/31/2012   CREATININE 1.0 08/31/2012   BUN 18.0 08/31/2012   CO2 28 08/31/2012   PSA 0.81 01/15/2009   INR 1.02 09/30/2011   INR 1.02 09/30/2011    ASSESSMENT AND PLAN:   1. History of amyloidosis: He has been on maintenance Velcade with the addition of Revlimid and Dexamethasone in April 2013. His light chain has been stable.  There is no dose limiting toxicity on the current chemo regimen.  There is a phase I trial for amyloidosis at [facility]; however, he prefers to continue current regimen unless there is clear sign of disease recurrence.  I agree with him.   2. DOE: Symptoms controlled with Lasix and Atenolol.  He is euvolumic on exam today.  He has not seen his cardiologist for a while.  I advised him to follow up with his cardiologist to see if there is indication for other cardioprotective meds  such as ACE-I.  3. Right leg/foot numbness: improved on Neurontin.  4. Pedal edema. Possibly secondary to venous stasis; controlled with Lasix and leg elevation.  5. Hypertension. Well controlled on atenolol and Lasix per Dr. Jens Som. 6. Chronic lower back pain: recent operation with Ortho from [place] with significant improvement. He is on oxycone qday prn.  7. Follow up: Weekly CBC and Velcade SQ (3wks on, 1 wk off). Today is day #1 of this current cycle.  Return visit in about 2 months.  8. Flu vaccination:  Given today.  The length of time of the face-to-face encounter was 15  minutes. More than 50% of time was spent counseling and coordination of care.

## 2012-09-21 NOTE — Telephone Encounter (Signed)
appts made and mw to add on tx and print for pt aom

## 2012-09-28 ENCOUNTER — Other Ambulatory Visit (HOSPITAL_BASED_OUTPATIENT_CLINIC_OR_DEPARTMENT_OTHER): Payer: Medicare Other | Admitting: Lab

## 2012-09-28 ENCOUNTER — Ambulatory Visit (HOSPITAL_BASED_OUTPATIENT_CLINIC_OR_DEPARTMENT_OTHER): Payer: Medicare Other

## 2012-09-28 VITALS — BP 170/83 | HR 65 | Temp 97.6°F | Resp 20

## 2012-09-28 DIAGNOSIS — E859 Amyloidosis, unspecified: Secondary | ICD-10-CM | POA: Diagnosis not present

## 2012-09-28 DIAGNOSIS — Z5112 Encounter for antineoplastic immunotherapy: Secondary | ICD-10-CM | POA: Diagnosis not present

## 2012-09-28 LAB — CBC WITH DIFFERENTIAL/PLATELET
Basophils Absolute: 0.1 10*3/uL (ref 0.0–0.1)
EOS%: 2.6 % (ref 0.0–7.0)
MCH: 31.2 pg (ref 27.2–33.4)
MCV: 95.9 fL (ref 79.3–98.0)
MONO%: 11 % (ref 0.0–14.0)
RBC: 4.61 10*6/uL (ref 4.20–5.82)
RDW: 14.2 % (ref 11.0–14.6)

## 2012-09-28 MED ORDER — ONDANSETRON HCL 8 MG PO TABS
8.0000 mg | ORAL_TABLET | Freq: Once | ORAL | Status: AC
  Administered 2012-09-28: 8 mg via ORAL

## 2012-09-28 MED ORDER — BORTEZOMIB CHEMO SQ INJECTION 3.5 MG (2.5MG/ML)
3.0000 mg | Freq: Once | INTRAMUSCULAR | Status: AC
  Administered 2012-09-28: 3 mg via SUBCUTANEOUS
  Filled 2012-09-28: qty 3

## 2012-09-28 NOTE — Patient Instructions (Signed)
[facility] Discharge Instructions for Patients Receiving Chemotherapy  Today you received the following chemotherapy agents Velcade.  To help prevent nausea and vomiting after your treatment, we encourage you to take your nausea medication as prescribed.   If you develop nausea and vomiting that is not controlled by your nausea medication, call the clinic. If it is after clinic hours your family physician or the after hours number for the clinic or go to the Emergency Department.   BELOW ARE SYMPTOMS THAT SHOULD BE REPORTED IMMEDIATELY:  *FEVER GREATER THAN 100.5 F  *CHILLS WITH OR WITHOUT FEVER  NAUSEA AND VOMITING THAT IS NOT CONTROLLED WITH YOUR NAUSEA MEDICATION  *UNUSUAL SHORTNESS OF BREATH  *UNUSUAL BRUISING OR BLEEDING  TENDERNESS IN MOUTH AND THROAT WITH OR WITHOUT PRESENCE OF ULCERS  *URINARY PROBLEMS  *BOWEL PROBLEMS  UNUSUAL RASH Items with * indicate a potential emergency and should be followed up as soon as possible.  One of the nurses will contact you 24 hours after your treatment. Please let the nurse know about any problems that you may have experienced. Feel free to call the clinic you have any questions or concerns. The clinic phone number is [phone].   I have been informed and understand all the instructions given to me. I know to contact the clinic, my physician, or go to the Emergency Department if any problems should occur. I do not have any questions at this time, but understand that I may call the clinic during office hours   should I have any questions or need assistance in obtaining follow up care.    __________________________________________  _____________  __________ Signature of Patient or Authorized Representative            Date                   Time    __________________________________________ Nurse's Signature    

## 2012-10-05 ENCOUNTER — Other Ambulatory Visit (HOSPITAL_BASED_OUTPATIENT_CLINIC_OR_DEPARTMENT_OTHER): Payer: Medicare Other

## 2012-10-05 ENCOUNTER — Ambulatory Visit (HOSPITAL_BASED_OUTPATIENT_CLINIC_OR_DEPARTMENT_OTHER): Payer: Medicare Other

## 2012-10-05 VITALS — BP 168/78 | HR 60 | Temp 97.8°F

## 2012-10-05 DIAGNOSIS — E859 Amyloidosis, unspecified: Secondary | ICD-10-CM

## 2012-10-05 DIAGNOSIS — Z5112 Encounter for antineoplastic immunotherapy: Secondary | ICD-10-CM | POA: Diagnosis not present

## 2012-10-05 LAB — CBC WITH DIFFERENTIAL/PLATELET
BASO%: 0.6 % (ref 0.0–2.0)
EOS%: 1.1 % (ref 0.0–7.0)
Eosinophils Absolute: 0.1 10*3/uL (ref 0.0–0.5)
MCH: 31.9 pg (ref 27.2–33.4)
MCV: 97.3 fL (ref 79.3–98.0)
MONO%: 7.2 % (ref 0.0–14.0)
NEUT#: 5.6 10*3/uL (ref 1.5–6.5)
RBC: 4.45 10*6/uL (ref 4.20–5.82)
RDW: 14.1 % (ref 11.0–14.6)

## 2012-10-05 MED ORDER — ONDANSETRON HCL 8 MG PO TABS
8.0000 mg | ORAL_TABLET | Freq: Once | ORAL | Status: AC
  Administered 2012-10-05: 8 mg via ORAL

## 2012-10-05 MED ORDER — BORTEZOMIB CHEMO SQ INJECTION 3.5 MG (2.5MG/ML)
3.1000 mg | Freq: Once | INTRAMUSCULAR | Status: AC
  Administered 2012-10-05: 3 mg via SUBCUTANEOUS
  Filled 2012-10-05: qty 3

## 2012-10-05 NOTE — Patient Instructions (Signed)
[facility] Discharge Instructions for Patients Receiving Chemotherapy  Today you received the following chemotherapy agents Velcade  To help prevent nausea and vomiting after your treatment, we encourage you to take your nausea medication as prescribed.   If you develop nausea and vomiting that is not controlled by your nausea medication, call the clinic. If it is after clinic hours your family physician or the after hours number for the clinic or go to the Emergency Department.   BELOW ARE SYMPTOMS THAT SHOULD BE REPORTED IMMEDIATELY:  *FEVER GREATER THAN 100.5 F  *CHILLS WITH OR WITHOUT FEVER  NAUSEA AND VOMITING THAT IS NOT CONTROLLED WITH YOUR NAUSEA MEDICATION  *UNUSUAL SHORTNESS OF BREATH  *UNUSUAL BRUISING OR BLEEDING  TENDERNESS IN MOUTH AND THROAT WITH OR WITHOUT PRESENCE OF ULCERS  *URINARY PROBLEMS  *BOWEL PROBLEMS  UNUSUAL RASH Items with * indicate a potential emergency and should be followed up as soon as possible.  Feel free to call the clinic you have any questions or concerns. The clinic phone number is [phone].   I have been informed and understand all the instructions given to me. I know to contact the clinic, my physician, or go to the Emergency Department if any problems should occur. I do not have any questions at this time, but understand that I may call the clinic during office hours   should I have any questions or need assistance in obtaining follow up care.    

## 2012-10-10 ENCOUNTER — Other Ambulatory Visit: Payer: Self-pay | Admitting: *Deleted

## 2012-10-10 NOTE — Telephone Encounter (Signed)
THIS REFILL REQUEST FOR REVLIMID WAS PLACED IN DR.HA'S ACTIVE WORK FOLDER. 

## 2012-10-11 ENCOUNTER — Other Ambulatory Visit: Payer: Self-pay | Admitting: *Deleted

## 2012-10-11 DIAGNOSIS — D72829 Elevated white blood cell count, unspecified: Secondary | ICD-10-CM

## 2012-10-11 MED ORDER — LENALIDOMIDE 5 MG PO CAPS: 5.0000 mg | ORAL_CAPSULE | Freq: Every day | ORAL | Status: DC

## 2012-10-11 NOTE — Telephone Encounter (Signed)
Faxed Rx for Revlimid refill to [facility].  Left VM for pt he needs to do Patient Survey for [employer] for refill.

## 2012-10-11 NOTE — Telephone Encounter (Signed)
[facility] faxed confirmation of facsimile receipt.  Revlimid referral was received for the [employer] Patient Support Program.

## 2012-10-12 ENCOUNTER — Other Ambulatory Visit: Payer: Medicare Other | Admitting: Lab

## 2012-10-12 ENCOUNTER — Ambulatory Visit: Payer: Medicare Other

## 2012-10-19 ENCOUNTER — Ambulatory Visit (HOSPITAL_BASED_OUTPATIENT_CLINIC_OR_DEPARTMENT_OTHER): Payer: Medicare Other

## 2012-10-19 ENCOUNTER — Other Ambulatory Visit (HOSPITAL_BASED_OUTPATIENT_CLINIC_OR_DEPARTMENT_OTHER): Payer: Medicare Other

## 2012-10-19 VITALS — BP 133/81 | HR 64 | Temp 98.1°F

## 2012-10-19 DIAGNOSIS — E859 Amyloidosis, unspecified: Secondary | ICD-10-CM

## 2012-10-19 DIAGNOSIS — Z5112 Encounter for antineoplastic immunotherapy: Secondary | ICD-10-CM

## 2012-10-19 DIAGNOSIS — D72829 Elevated white blood cell count, unspecified: Secondary | ICD-10-CM | POA: Diagnosis not present

## 2012-10-19 LAB — CBC WITH DIFFERENTIAL/PLATELET
Eosinophils Absolute: 0 10*3/uL (ref 0.0–0.5)
MONO#: 0.9 10*3/uL (ref 0.1–0.9)
NEUT#: 4 10*3/uL (ref 1.5–6.5)
RBC: 4.36 10*6/uL (ref 4.20–5.82)
RDW: 15.1 % — ABNORMAL HIGH (ref 11.0–14.6)
WBC: 5.9 10*3/uL (ref 4.0–10.3)
lymph#: 0.8 10*3/uL — ABNORMAL LOW (ref 0.9–3.3)

## 2012-10-19 LAB — COMPREHENSIVE METABOLIC PANEL (CC13)
ALT: 20 U/L (ref 0–55)
Albumin: 3.3 g/dL — ABNORMAL LOW (ref 3.5–5.0)
CO2: 29 mEq/L (ref 22–29)
Glucose: 130 mg/dl — ABNORMAL HIGH (ref 70–99)
Potassium: 4 mEq/L (ref 3.5–5.1)
Sodium: 142 mEq/L (ref 136–145)
Total Protein: 6 g/dL — ABNORMAL LOW (ref 6.4–8.3)

## 2012-10-19 MED ORDER — BORTEZOMIB CHEMO SQ INJECTION 3.5 MG (2.5MG/ML)
3.1000 mg | Freq: Once | INTRAMUSCULAR | Status: AC
Start: 2012-10-19 — End: 2012-10-19
  Administered 2012-10-19: 3 mg via SUBCUTANEOUS
  Filled 2012-10-19: qty 3

## 2012-10-19 MED ORDER — ONDANSETRON HCL 8 MG PO TABS
8.0000 mg | ORAL_TABLET | Freq: Once | ORAL | Status: AC
  Administered 2012-10-19: 8 mg via ORAL

## 2012-10-19 NOTE — Patient Instructions (Signed)
[facility] Discharge Instructions for Patients Receiving Chemotherapy  Today you received the following chemotherapy agents Velcade  To help prevent nausea and vomiting after your treatment, we encourage you to take your nausea medication as prescribed.   If you develop nausea and vomiting that is not controlled by your nausea medication, call the clinic. If it is after clinic hours your family physician or the after hours number for the clinic or go to the Emergency Department.   BELOW ARE SYMPTOMS THAT SHOULD BE REPORTED IMMEDIATELY:  *FEVER GREATER THAN 100.5 F  *CHILLS WITH OR WITHOUT FEVER  NAUSEA AND VOMITING THAT IS NOT CONTROLLED WITH YOUR NAUSEA MEDICATION  *UNUSUAL SHORTNESS OF BREATH  *UNUSUAL BRUISING OR BLEEDING  TENDERNESS IN MOUTH AND THROAT WITH OR WITHOUT PRESENCE OF ULCERS  *URINARY PROBLEMS  *BOWEL PROBLEMS  UNUSUAL RASH Items with * indicate a potential emergency and should be followed up as soon as possible.  Feel free to call the clinic you have any questions or concerns. The clinic phone number is [phone].   I have been informed and understand all the instructions given to me. I know to contact the clinic, my physician, or go to the Emergency Department if any problems should occur. I do not have any questions at this time, but understand that I may call the clinic during office hours   should I have any questions or need assistance in obtaining follow up care.    

## 2012-10-20 ENCOUNTER — Encounter: Payer: Self-pay | Admitting: Cardiology

## 2012-10-20 ENCOUNTER — Ambulatory Visit (INDEPENDENT_AMBULATORY_CARE_PROVIDER_SITE_OTHER): Payer: Medicare Other | Admitting: Cardiology

## 2012-10-20 ENCOUNTER — Other Ambulatory Visit: Payer: Self-pay | Admitting: Certified Registered Nurse Anesthetist

## 2012-10-20 VITALS — BP 143/77 | HR 68 | Wt 273.0 lb

## 2012-10-20 DIAGNOSIS — I471 Supraventricular tachycardia: Secondary | ICD-10-CM

## 2012-10-20 DIAGNOSIS — I10 Essential (primary) hypertension: Secondary | ICD-10-CM | POA: Diagnosis not present

## 2012-10-20 DIAGNOSIS — E859 Amyloidosis, unspecified: Secondary | ICD-10-CM

## 2012-10-20 DIAGNOSIS — R0989 Other specified symptoms and signs involving the circulatory and respiratory systems: Secondary | ICD-10-CM | POA: Diagnosis not present

## 2012-10-20 MED ORDER — PRAVASTATIN SODIUM 40 MG PO TABS: 40.0000 mg | ORAL_TABLET | Freq: Every evening | ORAL | Status: DC

## 2012-10-20 MED ORDER — ASPIRIN EC 81 MG PO TBEC: 81.0000 mg | DELAYED_RELEASE_TABLET | Freq: Every day | ORAL | Status: DC

## 2012-10-20 NOTE — Assessment & Plan Note (Signed)
Resume aspirin 81 mg daily. Resume Pravachol 40 mg daily. Check lipids and liver in 6 weeks.

## 2012-10-20 NOTE — Assessment & Plan Note (Signed)
Managed by oncology. He is having some dyspnea on exertion and lower extremity edema. Repeat echocardiogram.

## 2012-10-20 NOTE — Assessment & Plan Note (Signed)
Blood pressure controlled. Continue present medications. 

## 2012-10-20 NOTE — Assessment & Plan Note (Signed)
Resume Pravachol 40 mg daily. Check lipids and liver in 6 weeks. 

## 2012-10-20 NOTE — Patient Instructions (Addendum)
Your physician wants you to follow-up in: ONE YEAR WITH DR Shelda Pal will receive a reminder letter in the mail two months in advance. If you don't receive a letter, please call our office to schedule the follow-up appointment.   Your physician has requested that you have an echocardiogram. Echocardiography is a painless test that uses sound waves to create images of your heart. It provides your doctor with information about the size and shape of your heart and how well your heart's chambers and valves are working. This procedure takes approximately one hour. There are no restrictions for this procedure.   START ASPIRIN 81 MG ONCE DAILY WITH FOOD  RESTART PRAVASTATIN 40 MG ONCE DAILY AT BEDTIME  Your physician recommends that you return for lab work in: 6 WEEKS AFTER RESTARTING PRAVASTATIN=LIPID AND LIVER FUNCTION=272.0, V58.69

## 2012-10-20 NOTE — Progress Notes (Signed)
HPI: Pleasant gentleman with h/o SVT, Amyloid (s/p transplant), nonobstructive CAD for f/u. Cardiac catheterization in November of 2010 revealed normal filling pressures and a 70-80 % OM which is unchanged compared to previous. Cardiac MRI in January of 2011 showed an ejection fraction of 57%, mild biatrial enlargement, mild right ventricular enlargement. Cardiac amyloid could not be definitively excluded. Echocardiogram in February of 2012 showed an ejection fraction of 50% and a mildly dilated right ventricle. Patient admitted in February of 2012 with a wide complex tachycardia. Seen by electrophysiology and felt most likely SVT with aberrancy. Treated transiently with amiodarone but now on beta blockade. It was felt that if symptoms recur ablation would be warranted. Patient is receiving ongoing therapy for amyloidosis. I last saw him in April of 2012. Since then, he does have some dyspnea on exertion but no orthopnea or PND. He has chronic pedal edema that he attributes to his chemotherapeutic agent. No chest pain or syncope.   Current Outpatient Prescriptions  Medication Sig Dispense Refill  . acyclovir (ZOVIRAX) 400 MG tablet Take 400 mg by mouth daily.       Marland Kitchen atenolol (TENORMIN) 100 MG tablet Take 1 tablet (100 mg total) by mouth daily.  90 tablet  3  . dexamethasone (DECADRON) 2 MG tablet Take 3 tablets (6 mg total) by mouth once a week.  100 tablet  3  . furosemide (LASIX) 40 MG tablet Take 1 tablet (40 mg total) by mouth 2 (two) times daily. Once or twice a day  120 tablet  3  . gabapentin (NEURONTIN) 300 MG capsule Take 1 capsule (300 mg total) by mouth 2 (two) times daily.  60 capsule  2  . lenalidomide (REVLIMID) 5 MG capsule 21 days on and 7 days off      . NON FORMULARY Injection for chemo As directed      . [DISCONTINUED] furosemide (LASIX) 40 MG tablet Take 1 tablet (40 mg total) by mouth 2 (two) times daily.  60 tablet  3     Past Medical History  Diagnosis Date  .  Hypertension   . Coronary atherosclerosis of unspecified type of vessel, native or graft   . Cerebrovascular disease, unspecified   . Pure hypercholesterolemia   . Diverticulosis of colon (without mention of hemorrhage)   . Benign neoplasm of colon   . Unspecified hemorrhoids without mention of complication   . Degenerative disc disease   . 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 Low back pain   . Hemangioma   . Amyloidosis   . LBBB (left bundle branch block)   . 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 OSA (obstructive sleep apnea)   . Pedal edema 05/11/2012    Past Surgical History  Procedure Date  . Knee arthroscopy   . Carpal tunnel release 08/2006    Dr Thad Ranger at [facility] - Bilateral  . 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 Marital Status: Married    Spouse Name: N/A    Number of Children: 2  . Years of Education: N/A   Occupational History  . PRESIDENT     [employer]   Social History Main Topics  . Smoking status: Never Smoker   . Smokeless tobacco: Not on file  . Alcohol Use: Yes  . Drug Use: Not on file  . Sexually Active: Not on file   Other  Topics Concern  . Not on file   Social History Narrative  . No narrative on file    ROS: no fevers or chills, productive cough, hemoptysis, dysphasia, odynophagia, melena, hematochezia, dysuria, hematuria, rash, seizure activity, orthopnea, PND, claudication. Remaining systems are negative.  Physical Exam: Well-developed well-nourished in no acute distress.  Skin is warm and dry.  HEENT is normal.  Neck is supple.  Chest is clear to auscultation with normal expansion.  Cardiovascular exam is regular rate and rhythm.  Abdominal exam nontender or distended. No masses palpated. Extremities show 2+ edema. neuro grossly intact  ECG sinus rhythm with first degree AV block. Left bundle branch block.

## 2012-10-20 NOTE — Assessment & Plan Note (Signed)
Continue beta blocker. No recent episodes.

## 2012-10-23 ENCOUNTER — Other Ambulatory Visit: Payer: Self-pay | Admitting: *Deleted

## 2012-10-23 LAB — KAPPA/LAMBDA LIGHT CHAINS: Kappa:Lambda Ratio: 2.22 — ABNORMAL HIGH (ref 0.26–1.65)

## 2012-10-23 LAB — PROTEIN ELECTROPHORESIS, SERUM
Albumin ELP: 58.1 % (ref 55.8–66.1)
Beta 2: 4.8 % (ref 3.2–6.5)
Total Protein, Serum Electrophoresis: 6.4 g/dL (ref 6.0–8.3)

## 2012-10-23 MED ORDER — PRAVASTATIN SODIUM 40 MG PO TABS: 40.0000 mg | ORAL_TABLET | Freq: Every evening | ORAL | Status: DC

## 2012-10-25 ENCOUNTER — Other Ambulatory Visit: Payer: Self-pay | Admitting: *Deleted

## 2012-10-26 ENCOUNTER — Other Ambulatory Visit (HOSPITAL_BASED_OUTPATIENT_CLINIC_OR_DEPARTMENT_OTHER): Payer: Medicare Other

## 2012-10-26 ENCOUNTER — Ambulatory Visit (HOSPITAL_BASED_OUTPATIENT_CLINIC_OR_DEPARTMENT_OTHER): Payer: Medicare Other

## 2012-10-26 ENCOUNTER — Other Ambulatory Visit (HOSPITAL_COMMUNITY): Payer: Medicare Other

## 2012-10-26 VITALS — BP 122/69 | HR 67 | Temp 97.8°F

## 2012-10-26 DIAGNOSIS — E859 Amyloidosis, unspecified: Secondary | ICD-10-CM | POA: Diagnosis not present

## 2012-10-26 DIAGNOSIS — Z5112 Encounter for antineoplastic immunotherapy: Secondary | ICD-10-CM

## 2012-10-26 LAB — CBC WITH DIFFERENTIAL/PLATELET
Basophils Absolute: 0 10*3/uL (ref 0.0–0.1)
Eosinophils Absolute: 0.1 10*3/uL (ref 0.0–0.5)
HCT: 42.3 % (ref 38.4–49.9)
HGB: 13.9 g/dL (ref 13.0–17.1)
LYMPH%: 16.2 % (ref 14.0–49.0)
MCV: 95.9 fL (ref 79.3–98.0)
MONO#: 0.4 10*3/uL (ref 0.1–0.9)
NEUT#: 2.9 10*3/uL (ref 1.5–6.5)
Platelets: 161 10*3/uL (ref 140–400)
RBC: 4.41 10*6/uL (ref 4.20–5.82)
WBC: 4 10*3/uL (ref 4.0–10.3)
nRBC: 0 % (ref 0–0)

## 2012-10-26 MED ORDER — ONDANSETRON HCL 8 MG PO TABS
8.0000 mg | ORAL_TABLET | Freq: Once | ORAL | Status: AC
  Administered 2012-10-26: 8 mg via ORAL

## 2012-10-26 MED ORDER — BORTEZOMIB CHEMO SQ INJECTION 3.5 MG (2.5MG/ML)
3.1000 mg | Freq: Once | INTRAMUSCULAR | Status: AC
  Administered 2012-10-26: 3 mg via SUBCUTANEOUS
  Filled 2012-10-26: qty 3

## 2012-10-26 NOTE — Patient Instructions (Addendum)
[facility] Discharge Instructions for Patients Receiving Chemotherapy  Today you received the following chemotherapy agents :  Velcade.  To help prevent nausea and vomiting after your treatment, we encourage you to take your nausea medication as instructed by your physician.    If you develop nausea and vomiting that is not controlled by your nausea medication, call the clinic. If it is after clinic hours your family physician or the after hours number for the clinic or go to the Emergency Department.   BELOW ARE SYMPTOMS THAT SHOULD BE REPORTED IMMEDIATELY:  *FEVER GREATER THAN 100.5 F  *CHILLS WITH OR WITHOUT FEVER  NAUSEA AND VOMITING THAT IS NOT CONTROLLED WITH YOUR NAUSEA MEDICATION  *UNUSUAL SHORTNESS OF BREATH  *UNUSUAL BRUISING OR BLEEDING  TENDERNESS IN MOUTH AND THROAT WITH OR WITHOUT PRESENCE OF ULCERS  *URINARY PROBLEMS  *BOWEL PROBLEMS  UNUSUAL RASH Items with * indicate a potential emergency and should be followed up as soon as possible.  One of the nurses will contact you 24 hours after your treatment. Please let the nurse know about any problems that you may have experienced. Feel free to call the clinic you have any questions or concerns. The clinic phone number is [phone].   I have been informed and understand all the instructions given to me. I know to contact the clinic, my physician, or go to the Emergency Department if any problems should occur. I do not have any questions at this time, but understand that I may call the clinic during office hours   should I have any questions or need assistance in obtaining follow up care.    __________________________________________  _____________  __________ Signature of Patient or Authorized Representative            Date                   Time    __________________________________________ Nurse's Signature    

## 2012-10-31 ENCOUNTER — Other Ambulatory Visit: Payer: Self-pay | Admitting: Cardiology

## 2012-11-01 ENCOUNTER — Other Ambulatory Visit (HOSPITAL_COMMUNITY): Payer: Medicare Other

## 2012-11-02 ENCOUNTER — Ambulatory Visit (HOSPITAL_BASED_OUTPATIENT_CLINIC_OR_DEPARTMENT_OTHER): Payer: Medicare Other

## 2012-11-02 ENCOUNTER — Other Ambulatory Visit (HOSPITAL_BASED_OUTPATIENT_CLINIC_OR_DEPARTMENT_OTHER): Payer: Medicare Other

## 2012-11-02 ENCOUNTER — Ambulatory Visit (HOSPITAL_COMMUNITY): Payer: Medicare Other | Attending: Internal Medicine | Admitting: Radiology

## 2012-11-02 VITALS — BP 137/74 | HR 67 | Temp 96.9°F

## 2012-11-02 DIAGNOSIS — Z5112 Encounter for antineoplastic immunotherapy: Secondary | ICD-10-CM

## 2012-11-02 DIAGNOSIS — R0989 Other specified symptoms and signs involving the circulatory and respiratory systems: Secondary | ICD-10-CM | POA: Insufficient documentation

## 2012-11-02 DIAGNOSIS — R0609 Other forms of dyspnea: Secondary | ICD-10-CM | POA: Diagnosis not present

## 2012-11-02 DIAGNOSIS — E785 Hyperlipidemia, unspecified: Secondary | ICD-10-CM | POA: Diagnosis not present

## 2012-11-02 DIAGNOSIS — E859 Amyloidosis, unspecified: Secondary | ICD-10-CM | POA: Insufficient documentation

## 2012-11-02 DIAGNOSIS — I2510 Atherosclerotic heart disease of native coronary artery without angina pectoris: Secondary | ICD-10-CM | POA: Insufficient documentation

## 2012-11-02 DIAGNOSIS — I10 Essential (primary) hypertension: Secondary | ICD-10-CM | POA: Diagnosis not present

## 2012-11-02 DIAGNOSIS — I517 Cardiomegaly: Secondary | ICD-10-CM | POA: Diagnosis not present

## 2012-11-02 DIAGNOSIS — R0600 Dyspnea, unspecified: Secondary | ICD-10-CM

## 2012-11-02 DIAGNOSIS — I079 Rheumatic tricuspid valve disease, unspecified: Secondary | ICD-10-CM | POA: Insufficient documentation

## 2012-11-02 DIAGNOSIS — I059 Rheumatic mitral valve disease, unspecified: Secondary | ICD-10-CM | POA: Diagnosis not present

## 2012-11-02 DIAGNOSIS — I498 Other specified cardiac arrhythmias: Secondary | ICD-10-CM | POA: Diagnosis not present

## 2012-11-02 DIAGNOSIS — I447 Left bundle-branch block, unspecified: Secondary | ICD-10-CM | POA: Diagnosis not present

## 2012-11-02 LAB — CBC WITH DIFFERENTIAL/PLATELET
Basophils Absolute: 0 10*3/uL (ref 0.0–0.1)
EOS%: 0 % (ref 0.0–7.0)
HCT: 43.5 % (ref 38.4–49.9)
HGB: 14.1 g/dL (ref 13.0–17.1)
LYMPH%: 9 % — ABNORMAL LOW (ref 14.0–49.0)
MCH: 31.6 pg (ref 27.2–33.4)
MONO#: 0.3 10*3/uL (ref 0.1–0.9)
NEUT%: 88 % — ABNORMAL HIGH (ref 39.0–75.0)
Platelets: 141 10*3/uL (ref 140–400)
lymph#: 0.8 10*3/uL — ABNORMAL LOW (ref 0.9–3.3)

## 2012-11-02 MED ORDER — ONDANSETRON HCL 8 MG PO TABS
8.0000 mg | ORAL_TABLET | Freq: Once | ORAL | Status: AC
  Administered 2012-11-02: 8 mg via ORAL

## 2012-11-02 MED ORDER — BORTEZOMIB CHEMO SQ INJECTION 3.5 MG (2.5MG/ML)
3.1000 mg | Freq: Once | INTRAMUSCULAR | Status: AC
  Administered 2012-11-02: 3 mg via SUBCUTANEOUS
  Filled 2012-11-02: qty 3

## 2012-11-02 NOTE — Progress Notes (Signed)
Echocardiogram performed.  

## 2012-11-02 NOTE — Patient Instructions (Addendum)
[facility] Discharge Instructions for Patients Receiving Chemotherapy  Today you received the following chemotherapy agents Velcade  To help prevent nausea and vomiting after your treatment, we encourage you to take your nausea medication as prescribed.   If you develop nausea and vomiting that is not controlled by your nausea medication, call the clinic. If it is after clinic hours your family physician or the after hours number for the clinic or go to the Emergency Department.   BELOW ARE SYMPTOMS THAT SHOULD BE REPORTED IMMEDIATELY:  *FEVER GREATER THAN 100.5 F  *CHILLS WITH OR WITHOUT FEVER  NAUSEA AND VOMITING THAT IS NOT CONTROLLED WITH YOUR NAUSEA MEDICATION  *UNUSUAL SHORTNESS OF BREATH  *UNUSUAL BRUISING OR BLEEDING  TENDERNESS IN MOUTH AND THROAT WITH OR WITHOUT PRESENCE OF ULCERS  *URINARY PROBLEMS  *BOWEL PROBLEMS  UNUSUAL RASH Items with * indicate a potential emergency and should be followed up as soon as possible.  Feel free to call the clinic you have any questions or concerns. The clinic phone number is [phone].   I have been informed and understand all the instructions given to me. I know to contact the clinic, my physician, or go to the Emergency Department if any problems should occur. I do not have any questions at this time, but understand that I may call the clinic during office hours   should I have any questions or need assistance in obtaining follow up care.    

## 2012-11-06 ENCOUNTER — Other Ambulatory Visit: Payer: Self-pay | Admitting: *Deleted

## 2012-11-06 MED ORDER — LENALIDOMIDE 5 MG PO CAPS: 5.0000 mg | ORAL_CAPSULE | Freq: Every day | ORAL | Status: DC

## 2012-11-06 NOTE — Telephone Encounter (Deleted)
THIS REFILL REQUEST FOR REVLIMID WAS PLACED IN DR.HA'S ACTIVE WORK FOLDER. 

## 2012-11-07 ENCOUNTER — Encounter: Payer: Self-pay | Admitting: *Deleted

## 2012-11-07 NOTE — Progress Notes (Signed)
Fax from [employer] received, they have shipped pt's Revlimid on 11/06/12 for delivery today.

## 2012-11-10 ENCOUNTER — Other Ambulatory Visit: Payer: Medicare Other | Admitting: Lab

## 2012-11-10 ENCOUNTER — Ambulatory Visit: Payer: Medicare Other

## 2012-11-16 ENCOUNTER — Telehealth: Payer: Self-pay | Admitting: Oncology

## 2012-11-16 ENCOUNTER — Ambulatory Visit (HOSPITAL_BASED_OUTPATIENT_CLINIC_OR_DEPARTMENT_OTHER): Payer: Medicare Other | Admitting: Oncology

## 2012-11-16 ENCOUNTER — Other Ambulatory Visit (HOSPITAL_BASED_OUTPATIENT_CLINIC_OR_DEPARTMENT_OTHER): Payer: Medicare Other

## 2012-11-16 ENCOUNTER — Ambulatory Visit (HOSPITAL_BASED_OUTPATIENT_CLINIC_OR_DEPARTMENT_OTHER): Payer: Medicare Other

## 2012-11-16 ENCOUNTER — Encounter: Payer: Self-pay | Admitting: Oncology

## 2012-11-16 ENCOUNTER — Telehealth: Payer: Self-pay | Admitting: *Deleted

## 2012-11-16 VITALS — BP 153/83 | HR 78 | Temp 98.1°F | Resp 20 | Ht 72.0 in | Wt 278.7 lb

## 2012-11-16 DIAGNOSIS — R0609 Other forms of dyspnea: Secondary | ICD-10-CM

## 2012-11-16 DIAGNOSIS — Z5112 Encounter for antineoplastic immunotherapy: Secondary | ICD-10-CM

## 2012-11-16 DIAGNOSIS — E859 Amyloidosis, unspecified: Secondary | ICD-10-CM

## 2012-11-16 DIAGNOSIS — E8779 Other fluid overload: Secondary | ICD-10-CM

## 2012-11-16 DIAGNOSIS — R0989 Other specified symptoms and signs involving the circulatory and respiratory systems: Secondary | ICD-10-CM | POA: Diagnosis not present

## 2012-11-16 LAB — COMPREHENSIVE METABOLIC PANEL (CC13)
Albumin: 3.5 g/dL (ref 3.5–5.0)
BUN: 20 mg/dL (ref 7.0–26.0)
CO2: 26 mEq/L (ref 22–29)
Calcium: 9.2 mg/dL (ref 8.4–10.4)
Chloride: 106 mEq/L (ref 98–107)
Glucose: 117 mg/dl — ABNORMAL HIGH (ref 70–99)
Potassium: 4.6 mEq/L (ref 3.5–5.1)
Total Protein: 6.5 g/dL (ref 6.4–8.3)

## 2012-11-16 LAB — CBC WITH DIFFERENTIAL/PLATELET
HCT: 44.4 % (ref 38.4–49.9)
LYMPH%: 8.1 % — ABNORMAL LOW (ref 14.0–49.0)
MCH: 32.5 pg (ref 27.2–33.4)
MCHC: 33.1 g/dL (ref 32.0–36.0)
MONO%: 1.7 % (ref 0.0–14.0)
NEUT%: 89.8 % — ABNORMAL HIGH (ref 39.0–75.0)
Platelets: 231 10*3/uL (ref 140–400)
RBC: 4.52 10*6/uL (ref 4.20–5.82)
lymph#: 0.7 10*3/uL — ABNORMAL LOW (ref 0.9–3.3)

## 2012-11-16 MED ORDER — ONDANSETRON HCL 8 MG PO TABS
8.0000 mg | ORAL_TABLET | Freq: Once | ORAL | Status: AC
  Administered 2012-11-16: 8 mg via ORAL

## 2012-11-16 MED ORDER — BORTEZOMIB CHEMO SQ INJECTION 3.5 MG (2.5MG/ML)
3.1000 mg | Freq: Once | INTRAMUSCULAR | Status: AC
  Administered 2012-11-16: 3 mg via SUBCUTANEOUS
  Filled 2012-11-16: qty 3

## 2012-11-16 NOTE — Telephone Encounter (Signed)
Per staff message and POF I have sdcheduled appts. JMW

## 2012-11-16 NOTE — Progress Notes (Signed)
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 Negative cytogenetics and FISH myeloma panel. Cardiac MRI showed no sign of cardiac dysfunction.   PAST TREATMENT: Velcade, dexamethasone, melphalan x 3 cycles. 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   CURRENT THERAPY: started salvage Velcade for persistent serum free light chain on 09/17/2010 (3 weeks on, 1 week off). His regimen was modified to include Revlimid 5mg  PO d1-21 every 4 weeks and Dexamathasone 10mg  PO qweek in April 2013 to due slightly elevated light chain.    INTERVAL HISTORY: Johnny Mitchell 71 y.o. male returns for regular follow up.  He reports feeling stable.  He has mild DOE walking up a few flight of stairs.  He has bilateral pedal edema which improves with elevation of his legs at night.  He denied chest pain, orthopnea, PND, chest pain, palpitation.  He is tolerating Velcade/Rev/Dex without neuropathy, fatigue, bleeding, fever, recurrent infection.    Past Medical History  Diagnosis Date  . Hypertension   . Coronary atherosclerosis of unspecified type of vessel, native or graft   . Cerebrovascular disease, unspecified   . Pure hypercholesterolemia   . Diverticulosis of colon (without mention of hemorrhage)   . Benign neoplasm of colon   . Unspecified hemorrhoids without mention of complication   . Degenerative disc disease   . 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 Low back pain   . Hemangioma   . Amyloidosis   . LBBB (left bundle branch block)   . 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 OSA (obstructive sleep apnea)   . Pedal edema 05/11/2012    Past Surgical History   Procedure Date  . Knee arthroscopy   . Carpal tunnel release 08/2006    Dr Thad Ranger at [facility] - Bilateral  . Bone marrow transplant june 2011    Current Outpatient Prescriptions  Medication Sig Dispense Refill  . acyclovir (ZOVIRAX) 400 MG tablet Take 400 mg by mouth daily.       Marland Kitchen aspirin EC 81 MG tablet Take 1 tablet (81 mg total) by mouth daily.  90 tablet  3  . atenolol (TENORMIN) 100 MG tablet Take 1 tablet (100 mg total) by mouth daily.  90 tablet  3  . dexamethasone (DECADRON) 2 MG tablet Take 10 mg by mouth once a week.      . furosemide (LASIX) 40 MG tablet Take 1 tablet (40 mg total) by mouth 2 (two) times daily. Once or twice a day  120 tablet  3  . gabapentin (NEURONTIN) 300 MG capsule Take 1 capsule (300 mg total) by mouth 2 (two) times daily.  60 capsule  2  . lenalidomide (REVLIMID) 5 MG capsule Take 1 capsule (5 mg total) by mouth daily. 21 days on and 7 days off  21 capsule  0  . NON FORMULARY Injection for chemo As directed      . pravastatin (PRAVACHOL) 40 MG tablet TAKE 1 TABLET (40 MG TOTAL) BY MOUTH EVERY EVENING.  30 tablet  7  . [DISCONTINUED] pravastatin (PRAVACHOL) 40 MG tablet Take 1 tablet (40 mg total) by mouth every evening.  30 tablet  11   No  current facility-administered medications for this visit.   Facility-Administered Medications Ordered in Other Visits  Medication Dose Route Frequency Provider Last Rate Last Dose  . [COMPLETED] bortezomib SQ (VELCADE) chemo injection 3 mg  3 mg Subcutaneous Once Exie Parody, MD   3 mg at 11/16/12 1241  . [COMPLETED] ondansetron (ZOFRAN) tablet 8 mg  8 mg Oral Once Exie Parody, MD   8 mg at 11/16/12 1222    ALLERGIES:   has no known allergies.  REVIEW OF SYSTEMS:  The rest of the 14-point review of system was negative.   Filed Vitals:   11/16/12 1137  BP: 153/83  Pulse: 78  Temp: 98.1 F (36.7 C)  Resp: 20   Wt Readings from Last 3 Encounters:  11/16/12 278 lb 11.2 oz (126.417 kg)  10/20/12 273 lb (123.832 kg)   09/21/12 273 lb 3.2 oz (123.923 kg)   ECOG Performance status: 1  PHYSICAL EXAMINATION:   General:  Mildly obese man, in no acute distress.  Eyes:  no scleral icterus.  ENT:  There were no oropharyngeal lesions.  Neck was without thyromegaly.  Lymphatics:  Negative cervical, supraclavicular or axillary adenopathy.  Respiratory: lungs were clear bilaterally without wheezing or crackles.  Cardiovascular:  Regular rate and rhythm, S1/S2, without murmur, rub or gallop.  There was 1+ bilateral pedal edema.  GI:  abdomen was soft, flat, nontender, nondistended, without organomegaly.  Muscoloskeletal:  no spinal tenderness of palpation of vertebral spine.  Skin exam was without echymosis, petichae.  Neuro exam was nonfocal.  Patient was able to get on and off exam table without assistance.  Gait was normal.  Patient was alerted and oriented.  Attention was good.   Language was appropriate.  Mood was normal without depression.  Speech was not pressured.  Thought content was not tangential.      LABORATORY/RADIOLOGY DATA:  Lab Results  Component Value Date   WBC 8.4 11/16/2012   HGB 14.7 11/16/2012   HCT 44.4 11/16/2012   PLT 231 11/16/2012   GLUCOSE 117* 11/16/2012   CHOL 155 01/15/2009   TRIG 77 01/15/2009   HDL 39.9 01/15/2009   LDLCALC 100* 01/15/2009   ALKPHOS 122 11/16/2012   ALT 21 11/16/2012   AST 18 11/16/2012   NA 140 11/16/2012   K 4.6 11/16/2012   CL 106 11/16/2012   CREATININE 1.1 11/16/2012   BUN 20.0 11/16/2012   CO2 26 11/16/2012   PSA 0.81 01/15/2009   INR 1.02 09/30/2011   INR 1.02 09/30/2011    ASSESSMENT AND PLAN:   1. History of amyloidosis: He has been on maintenance Velcade with the addition of Revlimid and Dexamethasone in April 2013. His light chain has been stable.  There is no dose limiting toxicity on the current chemo regimen.  Light chain is pending today. There is a phase I trial for amyloidosis at [facility] however, he prefers to continue current regimen unless there is clear sign  of disease recurrence.  I agree with him. He has inquired about increasing the dose of Revlimid if light chains do not decrease. Will await the results before deciding.  2. DOE: Symptoms controlled with Lasix and Atenolol.  He is euvolumic on exam today.  He has not seen his cardiologist for a while.  I advised him to follow up with his cardiologist to see if there is indication for other cardioprotective meds such as ACE-I.  3. Right leg/foot numbness: improved on Neurontin.  4. Pedal edema. Possibly secondary  to venous stasis; controlled with Lasix and leg elevation.  5. Hypertension. Well controlled on atenolol and Lasix per Dr. Jens Som. 6. Chronic lower back pain: recent operation with Ortho from [facility] with significant improvement. He is on oxycone qday prn.  7. Follow up: Weekly CBC and Velcade SQ (3wks on, 1 wk off). Today is day #1 of this current cycle.  Return visit in about 2 months.    The length of time of the face-to-face encounter was 15  minutes. More than 50% of time was spent counseling and coordination of care.

## 2012-11-16 NOTE — Telephone Encounter (Signed)
appts made and printed for pt  °

## 2012-11-17 LAB — KAPPA/LAMBDA LIGHT CHAINS
Kappa free light chain: 3.29 mg/dL — ABNORMAL HIGH (ref 0.33–1.94)
Kappa:Lambda Ratio: 1.81 — ABNORMAL HIGH (ref 0.26–1.65)
Lambda Free Lght Chn: 1.82 mg/dL (ref 0.57–2.63)

## 2012-11-21 ENCOUNTER — Encounter: Payer: Medicare Other | Admitting: *Deleted

## 2012-11-23 ENCOUNTER — Other Ambulatory Visit (HOSPITAL_BASED_OUTPATIENT_CLINIC_OR_DEPARTMENT_OTHER): Payer: Medicare Other | Admitting: Lab

## 2012-11-23 ENCOUNTER — Ambulatory Visit (HOSPITAL_BASED_OUTPATIENT_CLINIC_OR_DEPARTMENT_OTHER): Payer: Medicare Other

## 2012-11-23 VITALS — BP 141/73 | HR 67 | Temp 97.9°F

## 2012-11-23 DIAGNOSIS — Z5112 Encounter for antineoplastic immunotherapy: Secondary | ICD-10-CM | POA: Diagnosis not present

## 2012-11-23 DIAGNOSIS — E859 Amyloidosis, unspecified: Secondary | ICD-10-CM

## 2012-11-23 LAB — CBC WITH DIFFERENTIAL/PLATELET
BASO%: 0.9 % (ref 0.0–2.0)
Eosinophils Absolute: 0.1 10*3/uL (ref 0.0–0.5)
MCHC: 33.2 g/dL (ref 32.0–36.0)
MCV: 97.1 fL (ref 79.3–98.0)
MONO%: 13.8 % (ref 0.0–14.0)
NEUT#: 3 10*3/uL (ref 1.5–6.5)
RBC: 4.53 10*6/uL (ref 4.20–5.82)
RDW: 14.1 % (ref 11.0–14.6)
WBC: 4.6 10*3/uL (ref 4.0–10.3)
nRBC: 0 % (ref 0–0)

## 2012-11-23 MED ORDER — ONDANSETRON HCL 8 MG PO TABS
8.0000 mg | ORAL_TABLET | Freq: Once | ORAL | Status: AC
  Administered 2012-11-23: 8 mg via ORAL

## 2012-11-23 MED ORDER — BORTEZOMIB CHEMO SQ INJECTION 3.5 MG (2.5MG/ML)
3.1000 mg | Freq: Once | INTRAMUSCULAR | Status: AC
  Administered 2012-11-23: 3 mg via SUBCUTANEOUS
  Filled 2012-11-23: qty 3

## 2012-11-27 ENCOUNTER — Other Ambulatory Visit: Payer: Self-pay | Admitting: Certified Registered Nurse Anesthetist

## 2012-11-30 ENCOUNTER — Other Ambulatory Visit: Payer: Self-pay | Admitting: *Deleted

## 2012-11-30 ENCOUNTER — Ambulatory Visit (HOSPITAL_BASED_OUTPATIENT_CLINIC_OR_DEPARTMENT_OTHER): Payer: Medicare Other

## 2012-11-30 ENCOUNTER — Other Ambulatory Visit (HOSPITAL_BASED_OUTPATIENT_CLINIC_OR_DEPARTMENT_OTHER): Payer: Medicare Other

## 2012-11-30 VITALS — BP 138/74 | HR 67 | Temp 98.3°F

## 2012-11-30 DIAGNOSIS — Z5112 Encounter for antineoplastic immunotherapy: Secondary | ICD-10-CM

## 2012-11-30 DIAGNOSIS — E859 Amyloidosis, unspecified: Secondary | ICD-10-CM

## 2012-11-30 LAB — CBC WITH DIFFERENTIAL/PLATELET
BASO%: 0.5 % (ref 0.0–2.0)
EOS%: 1.8 % (ref 0.0–7.0)
HCT: 43.6 % (ref 38.4–49.9)
MCHC: 32.8 g/dL (ref 32.0–36.0)
MONO#: 1.2 10*3/uL — ABNORMAL HIGH (ref 0.1–0.9)
NEUT%: 65.2 % (ref 39.0–75.0)
RBC: 4.48 10*6/uL (ref 4.20–5.82)
RDW: 14.2 % (ref 11.0–14.6)
WBC: 7.3 10*3/uL (ref 4.0–10.3)
lymph#: 1.2 10*3/uL (ref 0.9–3.3)
nRBC: 0 % (ref 0–0)

## 2012-11-30 MED ORDER — ONDANSETRON HCL 8 MG PO TABS
8.0000 mg | ORAL_TABLET | Freq: Once | ORAL | Status: AC
  Administered 2012-11-30: 8 mg via ORAL

## 2012-11-30 MED ORDER — BORTEZOMIB CHEMO SQ INJECTION 3.5 MG (2.5MG/ML)
3.1000 mg | Freq: Once | INTRAMUSCULAR | Status: AC
  Administered 2012-11-30: 3 mg via SUBCUTANEOUS
  Filled 2012-11-30: qty 3

## 2012-11-30 MED ORDER — LENALIDOMIDE 5 MG PO CAPS: 5.0000 mg | ORAL_CAPSULE | Freq: Every day | ORAL | Status: DC

## 2012-11-30 NOTE — Telephone Encounter (Signed)
THIS REFILL REQUEST FOR REVLIMID WAS PLACED IN DR.HA'S ACTIVE WORK FOLDER. 

## 2012-12-07 ENCOUNTER — Other Ambulatory Visit: Payer: Medicare Other | Admitting: Lab

## 2012-12-07 ENCOUNTER — Ambulatory Visit: Payer: Medicare Other

## 2012-12-14 ENCOUNTER — Ambulatory Visit (HOSPITAL_BASED_OUTPATIENT_CLINIC_OR_DEPARTMENT_OTHER): Payer: Medicare Other

## 2012-12-14 ENCOUNTER — Other Ambulatory Visit (HOSPITAL_BASED_OUTPATIENT_CLINIC_OR_DEPARTMENT_OTHER): Payer: Medicare Other

## 2012-12-14 VITALS — BP 125/61 | HR 63 | Temp 97.8°F

## 2012-12-14 DIAGNOSIS — Z5112 Encounter for antineoplastic immunotherapy: Secondary | ICD-10-CM

## 2012-12-14 DIAGNOSIS — M47817 Spondylosis without myelopathy or radiculopathy, lumbosacral region: Secondary | ICD-10-CM | POA: Diagnosis not present

## 2012-12-14 DIAGNOSIS — E859 Amyloidosis, unspecified: Secondary | ICD-10-CM

## 2012-12-14 LAB — COMPREHENSIVE METABOLIC PANEL (CC13)
ALT: 15 U/L (ref 0–55)
Albumin: 2.9 g/dL — ABNORMAL LOW (ref 3.5–5.0)
Alkaline Phosphatase: 113 U/L (ref 40–150)
Glucose: 108 mg/dl — ABNORMAL HIGH (ref 70–99)
Potassium: 4.1 mEq/L (ref 3.5–5.1)
Sodium: 144 mEq/L (ref 136–145)
Total Bilirubin: 0.53 mg/dL (ref 0.20–1.20)
Total Protein: 5.7 g/dL — ABNORMAL LOW (ref 6.4–8.3)

## 2012-12-14 LAB — CBC WITH DIFFERENTIAL/PLATELET
BASO%: 1 % (ref 0.0–2.0)
Eosinophils Absolute: 0.1 10*3/uL (ref 0.0–0.5)
MCHC: 33.1 g/dL (ref 32.0–36.0)
MCV: 98.9 fL — ABNORMAL HIGH (ref 79.3–98.0)
MONO#: 0.7 10*3/uL (ref 0.1–0.9)
MONO%: 8.5 % (ref 0.0–14.0)
NEUT#: 6.4 10*3/uL (ref 1.5–6.5)
RBC: 3.98 10*6/uL — ABNORMAL LOW (ref 4.20–5.82)
RDW: 14.9 % — ABNORMAL HIGH (ref 11.0–14.6)
WBC: 7.7 10*3/uL (ref 4.0–10.3)

## 2012-12-14 MED ORDER — ONDANSETRON HCL 8 MG PO TABS
8.0000 mg | ORAL_TABLET | Freq: Once | ORAL | Status: AC
  Administered 2012-12-14: 8 mg via ORAL

## 2012-12-14 MED ORDER — BORTEZOMIB CHEMO SQ INJECTION 3.5 MG (2.5MG/ML)
3.1000 mg | Freq: Once | INTRAMUSCULAR | Status: AC
  Administered 2012-12-14: 3 mg via SUBCUTANEOUS
  Filled 2012-12-14: qty 3

## 2012-12-14 NOTE — Patient Instructions (Signed)
[facility] Discharge Instructions for Patients Receiving Chemotherapy  Today you received the following chemotherapy agents Velcade.  To help prevent nausea and vomiting after your treatment, we encourage you to take your nausea medication as prescribed.   If you develop nausea and vomiting that is not controlled by your nausea medication, call the clinic. If it is after clinic hours your family physician or the after hours number for the clinic or go to the Emergency Department.   BELOW ARE SYMPTOMS THAT SHOULD BE REPORTED IMMEDIATELY:  *FEVER GREATER THAN 100.5 F  *CHILLS WITH OR WITHOUT FEVER  NAUSEA AND VOMITING THAT IS NOT CONTROLLED WITH YOUR NAUSEA MEDICATION  *UNUSUAL SHORTNESS OF BREATH  *UNUSUAL BRUISING OR BLEEDING  TENDERNESS IN MOUTH AND THROAT WITH OR WITHOUT PRESENCE OF ULCERS  *URINARY PROBLEMS  *BOWEL PROBLEMS  UNUSUAL RASH Items with * indicate a potential emergency and should be followed up as soon as possible.  One of the nurses will contact you 24 hours after your treatment. Please let the nurse know about any problems that you may have experienced. Feel free to call the clinic you have any questions or concerns. The clinic phone number is [phone].   I have been informed and understand all the instructions given to me. I know to contact the clinic, my physician, or go to the Emergency Department if any problems should occur. I do not have any questions at this time, but understand that I may call the clinic during office hours   should I have any questions or need assistance in obtaining follow up care.    __________________________________________  _____________  __________ Signature of Patient or Authorized Representative            Date                   Time    __________________________________________ Nurse's Signature    

## 2012-12-18 ENCOUNTER — Other Ambulatory Visit: Payer: Self-pay | Admitting: Oncology

## 2012-12-18 ENCOUNTER — Telehealth: Payer: Self-pay | Admitting: Pulmonary Disease

## 2012-12-18 DIAGNOSIS — E859 Amyloidosis, unspecified: Secondary | ICD-10-CM

## 2012-12-18 LAB — PROTEIN ELECTROPHORESIS, SERUM
Albumin ELP: 53.3 % — ABNORMAL LOW (ref 55.8–66.1)
Beta 2: 5.8 % (ref 3.2–6.5)
Beta Globulin: 6 % (ref 4.7–7.2)
Gamma Globulin: 12.4 % (ref 11.1–18.8)

## 2012-12-18 LAB — KAPPA/LAMBDA LIGHT CHAINS: Kappa:Lambda Ratio: 1.73 — ABNORMAL HIGH (ref 0.26–1.65)

## 2012-12-18 MED ORDER — DEXAMETHASONE 2 MG PO TABS: 10.0000 mg | ORAL_TABLET | ORAL | Status: DC

## 2012-12-18 NOTE — Telephone Encounter (Signed)
i spoke with pt and is scheduled to see SN 12/19/12 at 2:30. Nothing further was needed

## 2012-12-18 NOTE — Telephone Encounter (Signed)
Pt is calling because he is sick but he has not been seen in the office by SN or TP since 10/20/09. Pt says he does not believe this is right and says he "WILL NOT" be a new patient. I tried to explain that we will have to speak with SN before we can schedule him for follow-up or send in any prescriptions. Pt was not happy with this but did verbalize understanding. SN, pls advise.No Known Allergies

## 2012-12-18 NOTE — Telephone Encounter (Signed)
[id number]. Pt sees [facility] for sleep and last saw Dr. Kriste Basque 10-20-2009. Carron Curie, CMA

## 2012-12-18 NOTE — Telephone Encounter (Signed)
Per SN---will need to be worked in Doctor, hospital use the 2:30 slot ----we cant call anything in for the pt since he has not been seen in several years.  thanks

## 2012-12-19 ENCOUNTER — Ambulatory Visit (INDEPENDENT_AMBULATORY_CARE_PROVIDER_SITE_OTHER): Payer: Medicare Other | Admitting: Pulmonary Disease

## 2012-12-19 ENCOUNTER — Encounter: Payer: Self-pay | Admitting: Pulmonary Disease

## 2012-12-19 VITALS — BP 122/84 | HR 76 | Temp 97.9°F | Ht 71.0 in | Wt 281.0 lb

## 2012-12-19 DIAGNOSIS — I10 Essential (primary) hypertension: Secondary | ICD-10-CM

## 2012-12-19 DIAGNOSIS — I2510 Atherosclerotic heart disease of native coronary artery without angina pectoris: Secondary | ICD-10-CM

## 2012-12-19 DIAGNOSIS — J209 Acute bronchitis, unspecified: Secondary | ICD-10-CM | POA: Diagnosis not present

## 2012-12-19 DIAGNOSIS — R609 Edema, unspecified: Secondary | ICD-10-CM

## 2012-12-19 DIAGNOSIS — E669 Obesity, unspecified: Secondary | ICD-10-CM

## 2012-12-19 DIAGNOSIS — R600 Localized edema: Secondary | ICD-10-CM

## 2012-12-19 DIAGNOSIS — R0602 Shortness of breath: Secondary | ICD-10-CM | POA: Diagnosis not present

## 2012-12-19 DIAGNOSIS — E859 Amyloidosis, unspecified: Secondary | ICD-10-CM

## 2012-12-19 DIAGNOSIS — E7800 Pure hypercholesterolemia, unspecified: Secondary | ICD-10-CM

## 2012-12-19 MED ORDER — HYDROCOD POLST-CHLORPHEN POLST 10-8 MG/5ML PO LQCR: 5.0000 mL | Freq: Two times a day (BID) | ORAL | Status: DC

## 2012-12-19 MED ORDER — LEVOFLOXACIN 500 MG PO TABS: 500.0000 mg | ORAL_TABLET | Freq: Every day | ORAL | Status: DC

## 2012-12-19 MED ORDER — FIRST-DUKES MOUTHWASH MT SUSP: OROMUCOSAL | Status: DC

## 2012-12-19 NOTE — Patient Instructions (Addendum)
Today we updated your med list in our EPIC system...    Continue your current medications the same...  For your Bronchitis>>    We have prescribed a potent antibiotic- LEVAQUIN to take one daily for 10d til gone...    In addition you should take OTC MUCINEX 600mg - 2tabs twice daily w/ plenty of water...    And remember- NO SALT or Sodium in your diet...  To help your other symtoms>>    Use the Magic Mouthwash for your throat- one tsp gargle 7 swallow up to 4 times daily as needed...    Use the TUSSIONEX- our best cough syrup- one tsp every 12H as needed for cough...  Call for any questions.Marland KitchenMarland Kitchen

## 2012-12-21 ENCOUNTER — Other Ambulatory Visit (HOSPITAL_BASED_OUTPATIENT_CLINIC_OR_DEPARTMENT_OTHER): Payer: Medicare Other

## 2012-12-21 ENCOUNTER — Ambulatory Visit (HOSPITAL_BASED_OUTPATIENT_CLINIC_OR_DEPARTMENT_OTHER): Payer: Medicare Other

## 2012-12-21 VITALS — BP 134/71 | HR 66 | Temp 99.0°F | Resp 20

## 2012-12-21 DIAGNOSIS — E859 Amyloidosis, unspecified: Secondary | ICD-10-CM

## 2012-12-21 DIAGNOSIS — Z5112 Encounter for antineoplastic immunotherapy: Secondary | ICD-10-CM

## 2012-12-21 LAB — CBC WITH DIFFERENTIAL/PLATELET
Basophils Absolute: 0.1 10*3/uL (ref 0.0–0.1)
HCT: 45.8 % (ref 38.4–49.9)
HGB: 14.2 g/dL (ref 13.0–17.1)
MONO#: 0.9 10*3/uL (ref 0.1–0.9)
NEUT%: 67.2 % (ref 39.0–75.0)
WBC: 7.5 10*3/uL (ref 4.0–10.3)
lymph#: 1.3 10*3/uL (ref 0.9–3.3)

## 2012-12-21 MED ORDER — ONDANSETRON HCL 8 MG PO TABS
8.0000 mg | ORAL_TABLET | Freq: Once | ORAL | Status: AC
  Administered 2012-12-21: 8 mg via ORAL

## 2012-12-21 MED ORDER — BORTEZOMIB CHEMO SQ INJECTION 3.5 MG (2.5MG/ML)
3.1000 mg | Freq: Once | INTRAMUSCULAR | Status: AC
  Administered 2012-12-21: 3 mg via SUBCUTANEOUS
  Filled 2012-12-21: qty 3

## 2012-12-21 NOTE — Patient Instructions (Addendum)
[facility] Discharge Instructions for Patients Receiving Chemotherapy  Today you received the following chemotherapy agents:  Velcade  To help prevent nausea and vomiting after your treatment, we encourage you to take your nausea medication as ordered per MD.    If you develop nausea and vomiting that is not controlled by your nausea medication, call the clinic. If it is after clinic hours your family physician or the after hours number for the clinic or go to the Emergency Department.   BELOW ARE SYMPTOMS THAT SHOULD BE REPORTED IMMEDIATELY:  *FEVER GREATER THAN 100.5 F  *CHILLS WITH OR WITHOUT FEVER  NAUSEA AND VOMITING THAT IS NOT CONTROLLED WITH YOUR NAUSEA MEDICATION  *UNUSUAL SHORTNESS OF BREATH  *UNUSUAL BRUISING OR BLEEDING  TENDERNESS IN MOUTH AND THROAT WITH OR WITHOUT PRESENCE OF ULCERS  *URINARY PROBLEMS  *BOWEL PROBLEMS  UNUSUAL RASH Items with * indicate a potential emergency and should be followed up as soon as possible.   Please let the nurse know about any problems that you may have experienced. Feel free to call the clinic you have any questions or concerns. The clinic phone number is [phone].   I have been informed and understand all the instructions given to me. I know to contact the clinic, my physician, or go to the Emergency Department if any problems should occur. I do not have any questions at this time, but understand that I may call the clinic during office hours   should I have any questions or need assistance in obtaining follow up care.    __________________________________________  _____________  __________ Signature of Patient or Authorized Representative            Date                   Time    __________________________________________ Nurse's Signature    

## 2012-12-22 NOTE — Progress Notes (Signed)
This encounter was created in error - please disregard.

## 2012-12-28 ENCOUNTER — Ambulatory Visit (HOSPITAL_BASED_OUTPATIENT_CLINIC_OR_DEPARTMENT_OTHER): Payer: Medicare Other

## 2012-12-28 ENCOUNTER — Other Ambulatory Visit (HOSPITAL_BASED_OUTPATIENT_CLINIC_OR_DEPARTMENT_OTHER): Payer: Medicare Other | Admitting: Lab

## 2012-12-28 VITALS — BP 125/82 | HR 66 | Temp 97.9°F | Resp 20

## 2012-12-28 DIAGNOSIS — E859 Amyloidosis, unspecified: Secondary | ICD-10-CM

## 2012-12-28 DIAGNOSIS — Z5112 Encounter for antineoplastic immunotherapy: Secondary | ICD-10-CM | POA: Diagnosis not present

## 2012-12-28 LAB — CBC WITH DIFFERENTIAL/PLATELET
Basophils Absolute: 0.1 10*3/uL (ref 0.0–0.1)
EOS%: 2.7 % (ref 0.0–7.0)
HCT: 42.7 % (ref 38.4–49.9)
HGB: 14 g/dL (ref 13.0–17.1)
LYMPH%: 17 % (ref 14.0–49.0)
MCH: 31.8 pg (ref 27.2–33.4)
MCHC: 32.8 g/dL (ref 32.0–36.0)
MONO#: 1.1 10*3/uL — ABNORMAL HIGH (ref 0.1–0.9)
NEUT%: 61.4 % (ref 39.0–75.0)
Platelets: 145 10*3/uL (ref 140–400)
lymph#: 1.1 10*3/uL (ref 0.9–3.3)

## 2012-12-28 MED ORDER — ONDANSETRON HCL 8 MG PO TABS
8.0000 mg | ORAL_TABLET | Freq: Once | ORAL | Status: AC
  Administered 2012-12-28: 8 mg via ORAL

## 2012-12-28 MED ORDER — BORTEZOMIB CHEMO SQ INJECTION 3.5 MG (2.5MG/ML)
3.1000 mg | Freq: Once | INTRAMUSCULAR | Status: AC
  Administered 2012-12-28: 3 mg via SUBCUTANEOUS
  Filled 2012-12-28: qty 3

## 2012-12-28 NOTE — Patient Instructions (Addendum)
[facility] Discharge Instructions for Patients Receiving Chemotherapy  Today you received the following chemotherapy agents:  Velcade  To help prevent nausea and vomiting after your treatment, we encourage you to take your nausea medication as ordered per MD.    If you develop nausea and vomiting that is not controlled by your nausea medication, call the clinic. If it is after clinic hours your family physician or the after hours number for the clinic or go to the Emergency Department.   BELOW ARE SYMPTOMS THAT SHOULD BE REPORTED IMMEDIATELY:  *FEVER GREATER THAN 100.5 F  *CHILLS WITH OR WITHOUT FEVER  NAUSEA AND VOMITING THAT IS NOT CONTROLLED WITH YOUR NAUSEA MEDICATION  *UNUSUAL SHORTNESS OF BREATH  *UNUSUAL BRUISING OR BLEEDING  TENDERNESS IN MOUTH AND THROAT WITH OR WITHOUT PRESENCE OF ULCERS  *URINARY PROBLEMS  *BOWEL PROBLEMS  UNUSUAL RASH Items with * indicate a potential emergency and should be followed up as soon as possible.   Please let the nurse know about any problems that you may have experienced. Feel free to call the clinic you have any questions or concerns. The clinic phone number is [phone].   I have been informed and understand all the instructions given to me. I know to contact the clinic, my physician, or go to the Emergency Department if any problems should occur. I do not have any questions at this time, but understand that I may call the clinic during office hours   should I have any questions or need assistance in obtaining follow up care.    __________________________________________  _____________  __________ Signature of Patient or Authorized Representative            Date                   Time    __________________________________________ Nurse's Signature    

## 2013-01-02 ENCOUNTER — Telehealth: Payer: Self-pay | Admitting: Pulmonary Disease

## 2013-01-02 MED ORDER — LEVOFLOXACIN 500 MG PO TABS: 500.0000 mg | ORAL_TABLET | Freq: Every day | ORAL | Status: DC

## 2013-01-02 NOTE — Telephone Encounter (Signed)
Spoke with patient made him aware of recs as listed below per Dr. Kriste Basque.  Rx sent, patient verbalized understanding and nothing further needed at this time.

## 2013-01-02 NOTE — Telephone Encounter (Signed)
Spoke with patient, complaining of runny nose, congestion and prod cough w yellow mucus x 1 week.  Denies any f/c/s-- states was given 10d Levaquin and is requesting this again.  Dr. Kriste Basque please advise if this is ok to send in for patient   OV 12/19/12: Patient Instructions     Today we updated your med list in our EPIC system...  Continue your current medications the same...  For your Bronchitis>>  We have prescribed a potent antibiotic- LEVAQUIN to take one daily for 10d til gone...  In addition you should take OTC MUCINEX 600mg - 2tabs twice daily w/ plenty of water...  And remember- NO SALT or Sodium in your diet...  To help your other symtoms>>  Use the Magic Mouthwash for your throat- one tsp gargle 7 swallow up to 4 times daily as needed...  Use the TUSSIONEX- our best cough syrup- one tsp every 12H as needed for cough...  Call for any questions...    No Known Allergies   [facility] [address]

## 2013-01-02 NOTE — Telephone Encounter (Signed)
Per SN---ok to send in the levaquin 500 mg  #10  1 daily and the pt will need to take align once daily with the abx.  Nothing further is needed.

## 2013-01-03 ENCOUNTER — Other Ambulatory Visit: Payer: Self-pay | Admitting: *Deleted

## 2013-01-03 MED ORDER — LENALIDOMIDE 5 MG PO CAPS: 5.0000 mg | ORAL_CAPSULE | Freq: Every day | ORAL | Status: DC

## 2013-01-03 NOTE — Telephone Encounter (Signed)
Rec'd confirmation of receipt of Revlimid Rx.

## 2013-01-08 ENCOUNTER — Other Ambulatory Visit: Payer: Self-pay | Admitting: Oncology

## 2013-01-08 ENCOUNTER — Other Ambulatory Visit: Payer: Self-pay | Admitting: *Deleted

## 2013-01-08 ENCOUNTER — Encounter: Payer: Self-pay | Admitting: Oncology

## 2013-01-08 DIAGNOSIS — E859 Amyloidosis, unspecified: Secondary | ICD-10-CM

## 2013-01-08 MED ORDER — LENALIDOMIDE 5 MG PO CAPS: 5.0000 mg | ORAL_CAPSULE | Freq: Every day | ORAL | Status: DC

## 2013-01-08 NOTE — Progress Notes (Signed)
Received letter from [employer] stating pt qualifies for free drug assistance for Revlimid which expires on Dec. 31, 2014.  Received Rx from [facility] and I faxed it to [phone] today.

## 2013-01-10 NOTE — Patient Instructions (Addendum)
1.  Diagnosis:  Amyloidosis. 2.  Treatment:  Continue maintenance chemo Velcade.

## 2013-01-11 ENCOUNTER — Ambulatory Visit: Payer: Medicare Other | Admitting: Oncology

## 2013-01-11 ENCOUNTER — Ambulatory Visit (HOSPITAL_BASED_OUTPATIENT_CLINIC_OR_DEPARTMENT_OTHER): Payer: Medicare Other

## 2013-01-11 ENCOUNTER — Other Ambulatory Visit (HOSPITAL_BASED_OUTPATIENT_CLINIC_OR_DEPARTMENT_OTHER): Payer: Medicare Other

## 2013-01-11 ENCOUNTER — Ambulatory Visit (HOSPITAL_BASED_OUTPATIENT_CLINIC_OR_DEPARTMENT_OTHER): Payer: Medicare Other | Admitting: Oncology

## 2013-01-11 ENCOUNTER — Other Ambulatory Visit: Payer: Medicare Other | Admitting: Lab

## 2013-01-11 VITALS — BP 150/74 | HR 72 | Temp 97.4°F | Resp 20 | Ht 71.0 in | Wt 274.4 lb

## 2013-01-11 DIAGNOSIS — M545 Low back pain: Secondary | ICD-10-CM | POA: Diagnosis not present

## 2013-01-11 DIAGNOSIS — E859 Amyloidosis, unspecified: Secondary | ICD-10-CM | POA: Diagnosis not present

## 2013-01-11 DIAGNOSIS — I10 Essential (primary) hypertension: Secondary | ICD-10-CM | POA: Diagnosis not present

## 2013-01-11 DIAGNOSIS — G8929 Other chronic pain: Secondary | ICD-10-CM

## 2013-01-11 DIAGNOSIS — Z5112 Encounter for antineoplastic immunotherapy: Secondary | ICD-10-CM | POA: Diagnosis not present

## 2013-01-11 LAB — CBC WITH DIFFERENTIAL/PLATELET
Basophils Absolute: 0.1 10*3/uL (ref 0.0–0.1)
EOS%: 0.3 % (ref 0.0–7.0)
Eosinophils Absolute: 0 10*3/uL (ref 0.0–0.5)
HGB: 13.2 g/dL (ref 13.0–17.1)
LYMPH%: 17 % (ref 14.0–49.0)
MCH: 31.7 pg (ref 27.2–33.4)
MCV: 97.1 fL (ref 79.3–98.0)
MONO%: 13.1 % (ref 0.0–14.0)
NEUT#: 4.9 10*3/uL (ref 1.5–6.5)
NEUT%: 68.3 % (ref 39.0–75.0)
Platelets: 189 10*3/uL (ref 140–400)

## 2013-01-11 MED ORDER — DEXAMETHASONE 2 MG PO TABS: 10.0000 mg | ORAL_TABLET | ORAL | Status: DC

## 2013-01-11 MED ORDER — ONDANSETRON HCL 8 MG PO TABS
8.0000 mg | ORAL_TABLET | Freq: Once | ORAL | Status: AC
  Administered 2013-01-11: 8 mg via ORAL

## 2013-01-11 MED ORDER — BORTEZOMIB CHEMO SQ INJECTION 3.5 MG (2.5MG/ML)
3.1000 mg | Freq: Once | INTRAMUSCULAR | Status: AC
  Administered 2013-01-11: 3 mg via SUBCUTANEOUS
  Filled 2013-01-11: qty 3

## 2013-01-11 NOTE — Patient Instructions (Signed)
[facility] Discharge Instructions for Patients Receiving Chemotherapy  Today you received the following chemotherapy agents: Velcade. To help prevent nausea and vomiting after your treatment, we encourage you to take your nausea medication as needed.  If you develop nausea and vomiting that is not controlled by your nausea medication, call the clinic. If it is after clinic hours your family physician or the after hours number for the clinic or go to the Emergency Department.   BELOW ARE SYMPTOMS THAT SHOULD BE REPORTED IMMEDIATELY:  *FEVER GREATER THAN 100.5 F  *CHILLS WITH OR WITHOUT FEVER  NAUSEA AND VOMITING THAT IS NOT CONTROLLED WITH YOUR NAUSEA MEDICATION  *UNUSUAL SHORTNESS OF BREATH  *UNUSUAL BRUISING OR BLEEDING  TENDERNESS IN MOUTH AND THROAT WITH OR WITHOUT PRESENCE OF ULCERS  *URINARY PROBLEMS  *BOWEL PROBLEMS  UNUSUAL RASH Items with * indicate a potential emergency and should be followed up as soon as possible.  Feel free to call the clinic you have any questions or concerns. The clinic phone number is [phone].   I have been informed and understand all the instructions given to me. I know to contact the clinic, my physician, or go to the Emergency Department if any problems should occur. I do not have any questions at this time, but understand that I may call the clinic during office hours   should I have any questions or need assistance in obtaining follow up care.    

## 2013-01-11 NOTE — Progress Notes (Signed)
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 Negative cytogenetics and FISH myeloma panel. Cardiac MRI showed no sign of cardiac dysfunction.   PAST TREATMENT: Velcade, dexamethasone, melphalan x 3 cycles. 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   CURRENT THERAPY: started salvage Velcade for persistent serum free light chain on 09/17/2010 (3 weeks on, 1 week off). His regimen was modified to include Revlimid 5mg  PO d1-21 every 4 weeks and Dexamathasone 10mg  PO qweek in April 2013 to due slightly elevated light chain.    INTERVAL HISTORY: Johnny Mitchell 72 y.o. male returns for regular follow up by himself.  He reported having bronchitis about two weeks ago and had improvement with Levaquin given to him by Dr. Kriste Basque.  He still has some minor sinus congestion.  He denied fever, hemoptysis, chest pain, SOB, DOE.  He still has chronic bilateral lower extremities.    He is concerned that his light chain is not decreasing any further albeit that the ratio is still approaching normal range.  He queried if his Revlimid can be increased.  He denied side effects of chemo.  He denied anorexia, weight loss, fatigue, headache, visual changes, confusion, drenching night sweats, palpable lymph node swelling, mucositis, odynophagia, dysphagia, nausea vomiting, jaundice, chest pain, palpitation, gum bleeding, epistaxis, hematemesis, hemoptysis, abdominal pain, abdominal swelling, early satiety, melena, hematochezia, hematuria, skin rash, spontaneous bleeding, joint swelling, joint pain, heat or cold intolerance, bowel bladder incontinence, focal motor weakness, paresthesia, depression.     Past Medical History  Diagnosis Date    . Hypertension   . Coronary atherosclerosis of unspecified type of vessel, native or graft   . Cerebrovascular disease, unspecified   . Pure hypercholesterolemia   . Diverticulosis of colon (without mention of hemorrhage)   . Benign neoplasm of colon   . Unspecified hemorrhoids without mention of complication   . Degenerative disc disease   . 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 Low back pain   . Hemangioma   . Amyloidosis   . LBBB (left bundle branch block)   . 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 OSA (obstructive sleep apnea)   . Pedal edema 05/11/2012    Past Surgical History  Procedure Date  . Knee arthroscopy   . Carpal tunnel release 08/2006    Dr Thad Ranger at [facility] - Bilateral  . Bone marrow transplant june 2011    Current Outpatient Prescriptions  Medication Sig Dispense Refill  . acyclovir (ZOVIRAX) 400 MG tablet Take 400 mg by mouth daily.       Marland Kitchen aspirin EC 81 MG tablet Take 1 tablet (81 mg total) by mouth daily.  90 tablet  3  . atenolol (TENORMIN) 100 MG tablet Take 1 tablet (100 mg total) by mouth daily.  90 tablet  3  . dexamethasone (DECADRON) 2 MG tablet Take 5 tablets (10 mg total) by mouth once a week.  100 tablet  3  . furosemide (LASIX) 40 MG tablet Take 1 tablet (40 mg total) by mouth 2 (two) times daily. Once or twice a day  120 tablet  3  . gabapentin (NEURONTIN) 300 MG capsule Take 1 capsule (300 mg total)  by mouth 2 (two) times daily.  60 capsule  2  . lenalidomide (REVLIMID) 5 MG capsule Take 1 capsule (5 mg total) by mouth daily. 21 days on and 7 days off  21 capsule  0  . NON FORMULARY Injection for chemo As directed      . pravastatin (PRAVACHOL) 40 MG tablet TAKE 1 TABLET (40 MG TOTAL) BY MOUTH EVERY EVENING.  30 tablet  7    ALLERGIES:   has no known allergies.  REVIEW OF SYSTEMS:  The rest of the 14-point review of system was negative.   Filed Vitals:   01/11/13 0838  BP:  150/74  Pulse: 72  Temp: 97.4 F (36.3 C)  Resp: 20   Wt Readings from Last 3 Encounters:  01/11/13 274 lb 6.4 oz (124.467 kg)  12/19/12 281 lb (127.461 kg)  11/16/12 278 lb 11.2 oz (126.417 kg)   ECOG Performance status: 1  PHYSICAL EXAMINATION:   General:  Mildly obese man, in no acute distress.  Eyes:  no scleral icterus.  ENT:  There were no oropharyngeal lesions.  Neck was without thyromegaly.  Lymphatics:  Negative cervical, supraclavicular or axillary adenopathy.  Respiratory: lungs were clear bilaterally without wheezing or crackles.  Cardiovascular:  Regular rate and rhythm, S1/S2, without murmur, rub or gallop.  There was 1+ bilateral pedal edema.  GI:  abdomen was soft, flat, nontender, nondistended, without organomegaly.  Muscoloskeletal:  no spinal tenderness of palpation of vertebral spine.  Skin exam was without echymosis, petichae.  Neuro exam was nonfocal.  Patient was able to get on and off exam table without assistance.  Gait was normal.  Patient was alerted and oriented.  Attention was good.   Language was appropriate.  Mood was normal without depression.  Speech was not pressured.  Thought content was not tangential.      LABORATORY/RADIOLOGY DATA:  Lab Results  Component Value Date   WBC 7.1 01/11/2013   HGB 13.2 01/11/2013   HCT 40.5 01/11/2013   PLT 189 01/11/2013   GLUCOSE 108* 12/14/2012   CHOL 155 01/15/2009   TRIG 77 01/15/2009   HDL 39.9 01/15/2009   LDLCALC 100* 01/15/2009   ALKPHOS 113 12/14/2012   ALT 15 12/14/2012   AST 11 12/14/2012   NA 144 12/14/2012   K 4.1 12/14/2012   CL 110* 12/14/2012   CREATININE 0.9 12/14/2012   BUN 17.0 12/14/2012   CO2 26 12/14/2012   PSA 0.81 01/15/2009   INR 1.02 09/30/2011   INR 1.02 09/30/2011    ASSESSMENT AND PLAN:   1. History of amyloidosis: He has been on maintenance Velcade with the addition of Revlimid and Dexamethasone in April 2013. His light chain has been stable. I advised him that elevated lambda in and of itself with normal  kappa:lambda ratio is still a good achievement for maintenance therapy.  He requested that the dose of Revlimid be increased.  This is reasonable since the current dose of 5mg  is still low.  Since he already had this cycle filled.  We will increase Revlimid to 10mg  with the next cycle.  I advised him that combination Revlimid and Velcade has higher chance of cytopenia, infection, blood clot, secondary cancer.   He expressed informed understanding and wished to proceed.  I advised him to start Aspirin 81mg  PO daily to decrease risk of thrombosis.  He has had 2 years of maintenance therapy since 09/2010.  I advised him to follow up with Dr. Mellody Memos to decide whether  we will continue with indefinite maintenance therapy or go off on chemo holiday soon.  2. Pedal edema. Possibly secondary to venous stasis; controlled with Lasix and leg elevation.  3. Hypertension. Well controlled on atenolol and Lasix per Dr. Jens Som. 4. Chronic lower back pain: recent operation with Ortho from [place] with significant improvement. He is on oxycone qday prn.  5. Follow up: Velcade SQ (3wks on, 1 wk off). Today is day #1 of this current cycle.  Return visit in about 1 month.      The length of time of the face-to-face encounter was 25  minutes. More than 50% of time was spent counseling and coordination of care.

## 2013-01-12 ENCOUNTER — Other Ambulatory Visit: Payer: Self-pay | Admitting: Certified Registered Nurse Anesthetist

## 2013-01-14 NOTE — Progress Notes (Signed)
Subjective:     Patient ID: Johnny Mitchell, male   DOB: 1941/01/17, 72 y.o.   MRN: [id number]  HPI 72 y/o WM here for an add-on visit... he has multiple medical problems as noted below...    ~  September 19, 2009:  Add-on appt due to CP- several weeks w/ sharp left sided CP & flank pain but he is vague & hard to pin down- ?worse w/ exerc & decr w/ rest... ?radiates to left arm, and assoc w/ incr SOB/ DOE that seems to be getting worse... denies palpit, syncope, orthopnea, periph edema... prev cardiac eval DrJoeLeB & DrDeGent in 2002 w/ Cardiolite ?inferior basal ischemia, and subseq cath showed 20-30% LAD, sm second obtuse marginal branch of CIRC (2mm vessel) w/ 90% lesion... med Rx rec, but pt has repeatedly refused statin therapy...  ~  October 20, 2009:  s/p complete cardiac eval from DrCrenshaw w/ 2DEcho, Myoview, Cardiac Cath- he has very poor exercise capacity, biatrial enlargement, non-obstructive CAD (see below)... his CP seems to have resolved- he's not c/o pain at present; rather he's c/o dyspnea- DOE which seems progressive over the past yr by his hx- denies cough/ phlegm/ hemoptysis, no f/c/s, appetite & weight are stable... he is concerned, however because his wife says he smells bad & he remembers an old frat brother who smelled bad, then was Dx w/ lung cancer, and the smell went away after removal of the tumor... I re-assured him that we would check thoroughly for any hidden tumor w/ CT Chest & proceed w/ Full PFT's as well...  ~  December 19, 2012:  3+year ROV & Johnny Mitchell is added on to the sched at his request for URI symptoms- c/o cough, congestion, yellow sput, sl hoarse; but denies f/c/s, no blood in sput, no CP, palpit, ch in dyspnea...  Exam shows red, inflammed post pharynx, bilat cerumen, clear chest x scat rhonchi...  We decided to treat w/ Levaquin, MMW, Mucinex-2Bid & Fluids, +Tussionex for cough...    He is followed by DrSood for Sleep Med> Hx OSA w/ Sleep Study 2010 showing  AHI=23 & desat to 60%, on CPAP & ave ~6H per night, last seen 6/12 & improved- resting better, more energy, etc; no changes made...    He is followed by Aletta Edouard & DrAllred for Cards> last seen 11/13 for f/u Hx SVT w/ aberrancy, nonobstructive CAD, no definite cardiac amyloid; their notes are reviewed & they wanted him on ASA81, Prav40, + his Atenolol100 & Lasix40- 1to2 daily...    He is followed by Lenis Noon for Heme/Onc w/ Dx of Primary AL Amyloid w/ Nephropathy;  Treated w/ Velcade, Dexameth, Melphalan, & then had autologous stem cell Tx 6/11;  Started salvage Velcade for persist serum free light chains on 10/11;  Revlimid & Dexameth added 4/13... Last seen 12/13 & stable...    We reviewed prob list, meds, xrays and labs> see below for updates >>    Current Problems:   DYSPNEA (ICD-786.05) - Eval 11/10 in progress w/ Full PFT's, Spriral CT Chest, Sleep Study ordered... ~  CXR 11/10 showed scarring vs atx in left mid lung & right base, incr markings at bases, tort Ao... no change from prev film. ~  Walking in office: O2 sat 94% RA rest; desat to 85% after 3 laps w/ HR=105... ~  CT Chest = CTAngio done 11/10 by Cards & showed no evid PE, atelectasis, sm effusions, edema of chest wall, sm HH... ~  Full PFT's = weren't done ~  Sleep Study 2010 showed AHI=23 w/ desat to 60%; advised wt reduction & referred to DrSood=> started on CPAP ~  He is followed by DrSood for Sleep Med> Hx OSA w/ Sleep Study 2010 showing AHI=23 & desat to 60%, on CPAP & ave ~6H per night, last seen 6/12 & improved- resting better, more energy, etc; no changes made... ~  1/14:  He presents for add-on appt due to URI w/ cough, congestion, etc... Given Levaquin, MMW, Tussionex, Mucinex , Fluids...  HYPERTENSION (ICD-401.9) - prev controlled on ATENOLOL 100mg /d,  Norvasc 5mg /d, Diovan 160mg /d, and LASIX 20mg /d...  ~  11/10:  BP= 110/72 today, tolerating meds well; denies HA, fatigue, visual changes, palipit, dizziness, syncope, edema,  etc... ~  1/14:  BP= 122/84 on ATENOLOL 100mg /d & LASIX 40mg - 1or2 daily  CAD (ICD-414.00) - he saw DrDeGent in 2002 & DrWall in 2004... mult risk factors- male, HBP, Chol, Obese, etc... abnormal cardiolite 2/02 w/ ?inferobasilar ischemia, EF=61%... cath 4/02 w/ 1 vessel CAD- 20-30% LAD & 90% in sm (2mm) 2nd obtuse marginal branch of CIRC... Rec~med Rx... he hasn't lost any weight and is still too sedentary... Full cardiac eval 10/10 by DrCrenshaw> ~  10/10: EKG shows NSR, old anteroseptal scar, no acute changes... no change from old EKG's. ~  10/10: 2DEcho showed mild LVH, norm LVF w/ EF=55-60% & no wall motion abn, +DD & mod dil LA/RA, RVsys= 57 (nl<30). ~  10/10: Myoview showed poor exerc capacity, normal stress nuclear study w/ EF= 56%. ~  11/10: Cardiac Cath showed Lmain norm; LAD w/ mult 20-30% lesion; RCA w/ mult 20% stenoses; CIRC w/ 70-80% second obtuse marginal branch lesion (sm vessel= 2mm & no change from 2002); LVEDP=15... DrCrenshaw rec> Med Rx. ~  1/14:  He is followed by Aletta Edouard & DrAllred for Cards> last seen 11/13 for f/u Hx SVT w/ aberrancy, nonobstructive CAD, no definite cardiac amyloid; their notes are reviewed & they wanted him on ASA81, Prav40, + his Atenolol100 & Lasix40- 1to2 daily...  CEREBROVASCULAR DISEASE (ICD-437.9) - on ASA 81mg /d... see MRI done 11/07 [facility] showing small vessel disease  & one old infarct, also one parietal bone enhancing lesion and CSpine disease... he had Transcranial Doppler Ultrasound w/ low flow velocities in post-circ... neg CDopplers w/ min plaque, no stenoses.  HYPERCHOLESTEROLEMIA (ICD-272.0) - prev on diet alone... refused Lipitor due to intolerance... now on PRAVASTATIN 40mg /d... ~  FLP 3/08 showed TChol 182, TG 108, HDL 41, LDL 119... LDL goal of <70 and he is aware of this. ~  FLP 1/09 showed TChol 153, TG 75, HDL 35, LDL 103 ~  FLP 2/10 showed TChol 155, TG 77, HDL 40, LDL 100 ~  10/10: pt agreed to start Pravastatin40 after cardiac  eval by DrCrenshaw=> he plans f/u FLP on med...  OBESITY (ICD-278.00) - not motivated to follow low cal, low fat, low sodium diets... 6' tall= BMI ~39... discussed diet and exercise therapy (again)... ~  weight 1/09 = 289# ~  weight 2/10 = 286# ~  weight 10/10 = 275# ~  weight 11/10 = 268# ~  Weight 1/14 = 281#  DIVERTICULOSIS OF COLON (ICD-562.10) COLONIC POLYPS (ICD-211.3) - hx adenomatous polyp (last 2003= 8mm size)... ~  colonoscopy 3/07 by DrGessner showed divertics, hems, otherw neg... ~  colonoscopy 9/10 by DrGessner for heme +stools showed severe diverticulosis left colon, +int hems... HEMORRHOIDS (ICD-455.6)  DEGENERATIVE JOINT DISEASE (ICD-715.90) - uses ETODOLAC 400mg  Bid as needed. ~  s/p surg for left CTS at [facility]  in 2007. ~  2/10: eval DrZieminski w/ OA, & Dupuytren's- sent to DrGramig- conservative Rx.  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  PRIMARY AL AMYLOISOSIS >> followed by Lenis Noon & [facility]... ~  1/14:  He is followed by Lenis Noon for Heme/Onc w/ Dx of Primary AL Amyloid w/ Nephropathy;  Treated w/ Velcade, Dexameth, Melphalan, & then had autologous stem cell Tx 6/11;  Started salvage Velcade for persist serum free light chains on 10/11;  Revlimid & Dexameth added 4/13... Last seen 12/13 & stable...   Past Medical History  Diagnosis Date  . Hypertension   . Coronary atherosclerosis of unspecified type of vessel, native or graft   . Cerebrovascular disease, unspecified   . Pure hypercholesterolemia   . Diverticulosis of colon (without mention of hemorrhage)   . Benign neoplasm of colon   . Unspecified hemorrhoids without mention of complication   . Degenerative disc disease   . 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 Low back pain   . Hemangioma   .  Amyloidosis   . LBBB (left bundle branch block)   . 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 OSA (obstructive sleep apnea)   . Pedal edema 05/11/2012    Past Surgical History  Procedure Date  . Knee arthroscopy   . Carpal tunnel release 08/2006    Dr Thad Ranger at [facility] - Bilateral  . Bone marrow transplant june 2011    Outpatient Encounter Prescriptions as of 12/19/2012  Medication Sig Dispense Refill  . acyclovir (ZOVIRAX) 400 MG tablet Take 400 mg by mouth daily.       Marland Kitchen aspirin EC 81 MG tablet Take 1 tablet (81 mg total) by mouth daily.  90 tablet  3  . atenolol (TENORMIN) 100 MG tablet Take 1 tablet (100 mg total) by mouth daily.  90 tablet  3  . furosemide (LASIX) 40 MG tablet Take 1 tablet (40 mg total) by mouth 2 (two) times daily. Once or twice a day  120 tablet  3  . gabapentin (NEURONTIN) 300 MG capsule Take 1 capsule (300 mg total) by mouth 2 (two) times daily.  60 capsule  2  . NON FORMULARY Injection for chemo As directed      . pravastatin (PRAVACHOL) 40 MG tablet TAKE 1 TABLET (40 MG TOTAL) BY MOUTH EVERY EVENING.  30 tablet  7  . [DISCONTINUED] dexamethasone (DECADRON) 2 MG tablet Take 5 tablets (10 mg total) by mouth once a week.  100 tablet  3  . [DISCONTINUED] lenalidomide (REVLIMID) 5 MG capsule Take 1 capsule (5 mg total) by mouth daily. 21 days on and 7 days off  21 capsule  0  . [DISCONTINUED] chlorpheniramine-HYDROcodone (TUSSIONEX PENNKINETIC ER) 10-8 MG/5ML LQCR Take 5 mLs by mouth every 12 (twelve) hours.  140 mL  2  . [DISCONTINUED] Diphenhyd-Hydrocort-Nystatin (FIRST-DUKES MOUTHWASH) SUSP Take 1 tsp gargle and swallow four times daily as needed  120 mL  5  . [DISCONTINUED] levofloxacin (LEVAQUIN) 500 MG tablet Take 1 tablet (500 mg total) by mouth daily.  10 tablet  0    No Known Allergies   Current Medications, Allergies, Past Medical History, Past Surgical History, Family History, and Social History were reviewed in [employer] record.  Review of Systems         See HPI - all other systems neg except as noted... The patient complains of chest pain, dyspnea on exertion, and difficulty walking.  The patient denies anorexia, fever, weight loss, weight gain, vision loss, decreased hearing, hoarseness, syncope, peripheral edema, prolonged cough, headaches, hemoptysis, abdominal pain, melena, hematochezia, severe indigestion/heartburn, hematuria, incontinence, muscle weakness, suspicious skin lesions, transient blindness, depression, unusual weight change, abnormal bleeding, enlarged lymph nodes, and angioedema.     Objective:   Physical Exam    WD, Obese, 73 y/o WM in NAD... GENERAL:  Alert & oriented; pleasant & cooperative... HEENT:  [place]/AT, EOM-wnl, PERRLA, EACs-cerumen, NOSE-clear, THROAT- sl red & inflammed... NECK:  Supple w/ fairROM; no JVD; normal carotid impulses w/o bruits; no thyromegaly or nodules palpated; no lymphadenopathy. CHEST:  Clear to P & A; without wheezes/ rales/ only a few scat rhonchi heard... HEART:  Regular Rhythm; without murmurs/ rubs/ or gallops detected... ABDOMEN:  Obese, soft & nontender; normal bowel sounds; no organomegaly or masses palpated... EXT: without deformities, mild arthritic changes; no varicose veins/ +venous insuffic/ tr edema. NEURO:  CN's intact; motor testing symmetric and 5/5; no focal neuro deficits found... DERM:  No lesions noted; no rash etc...  RADIOLOGY DATA:  Reviewed in the EPIC EMR & discussed w/ the patient...  LABORATORY DATA:  Reviewed in the EPIC EMR & discussed w/ the patient...   Assessment:     URI, Bronchitis>> we discussed treatment w/ Levaquin, MMW, Mucinex, Fluids & Tussionex...  HBP, Non-obstructive CAD>  Followed by Aletta Edouard & stable on Atenolol & Lasix...  Cerebrovasc Dis>  On ASA w/o cerebral ischemic symptoms...  Hppercholesterolemia>  On Prav40 but hasn't had f/u FLP in some time, not fasting today; we  discussed need for FLP soon....  Obesity>  His weight is 281# & BMI~40; we reviewed diet, exercise, wt reduction strategies...  GI- Divertics, Polyps, Hems>  Followed by DrGessner...  DJD, Cspine spondylosis, LBP>  Prev eval by DrElsner...  AMYLOIDOSIS>  Followed by [facility] & [facility]...    Plan:     Patient's Medications  New Prescriptions   No medications on file  Previous Medications   ACYCLOVIR (ZOVIRAX) 400 MG TABLET    Take 400 mg by mouth daily.    ASPIRIN EC 81 MG TABLET    Take 1 tablet (81 mg total) by mouth daily.   ATENOLOL (TENORMIN) 100 MG TABLET    Take 1 tablet (100 mg total) by mouth daily.   FUROSEMIDE (LASIX) 40 MG TABLET    Take 1 tablet (40 mg total) by mouth 2 (two) times daily. Once or twice a day   GABAPENTIN (NEURONTIN) 300 MG CAPSULE    Take 1 capsule (300 mg total) by mouth 2 (two) times daily.   NON FORMULARY    Injection for chemo As directed   PRAVASTATIN (PRAVACHOL) 40 MG TABLET    TAKE 1 TABLET (40 MG TOTAL) BY MOUTH EVERY EVENING.  Modified Medications   Modified Medication Previous Medication   DEXAMETHASONE (DECADRON) 2 MG TABLET dexamethasone (DECADRON) 2 MG tablet      Take 5 tablets (10 mg total) by mouth once a week.    Take 5 tablets (10 mg total) by mouth once a week.   LENALIDOMIDE (REVLIMID) 5 MG CAPSULE lenalidomide (REVLIMID) 5 MG capsule      Take 1 capsule (5 mg total) by mouth daily. 21 days on and 7 days off    Take 1 capsule (5  mg total) by mouth daily. 21 days on and 7 days off  Discontinued Medications   CHLORPHENIRAMINE-HYDROCODONE (TUSSIONEX PENNKINETIC ER) 10-8 MG/5ML LQCR    Take 5 mLs by mouth every 12 (twelve) hours.   DIPHENHYD-HYDROCORT-NYSTATIN (FIRST-DUKES MOUTHWASH) SUSP    Take 1 tsp gargle and swallow four times daily as needed   LENALIDOMIDE (REVLIMID) 5 MG CAPSULE    Take 1 capsule (5 mg total) by mouth daily. 21 days on and 7 days off

## 2013-01-16 ENCOUNTER — Telehealth: Payer: Self-pay | Admitting: *Deleted

## 2013-01-16 NOTE — Telephone Encounter (Signed)
I have called and left the patient a message to call me back. I need to move the appts to later in the morning on 2/6 due to a meeting.  JMW

## 2013-01-18 ENCOUNTER — Other Ambulatory Visit (HOSPITAL_BASED_OUTPATIENT_CLINIC_OR_DEPARTMENT_OTHER): Payer: Medicare Other | Admitting: Lab

## 2013-01-18 ENCOUNTER — Telehealth: Payer: Self-pay | Admitting: *Deleted

## 2013-01-18 ENCOUNTER — Ambulatory Visit (HOSPITAL_BASED_OUTPATIENT_CLINIC_OR_DEPARTMENT_OTHER): Payer: Medicare Other

## 2013-01-18 VITALS — BP 146/65 | HR 65 | Temp 98.5°F

## 2013-01-18 DIAGNOSIS — E859 Amyloidosis, unspecified: Secondary | ICD-10-CM

## 2013-01-18 LAB — CBC WITH DIFFERENTIAL/PLATELET
Basophils Absolute: 0.1 10*3/uL (ref 0.0–0.1)
EOS%: 2.4 % (ref 0.0–7.0)
Eosinophils Absolute: 0.1 10*3/uL (ref 0.0–0.5)
HGB: 13.6 g/dL (ref 13.0–17.1)
LYMPH%: 13.1 % — ABNORMAL LOW (ref 14.0–49.0)
MCH: 32.2 pg (ref 27.2–33.4)
MCV: 97.4 fL (ref 79.3–98.0)
MONO%: 11.4 % (ref 0.0–14.0)
NEUT#: 3.4 10*3/uL (ref 1.5–6.5)
NEUT%: 72 % (ref 39.0–75.0)
Platelets: 151 10*3/uL (ref 140–400)
RDW: 15 % — ABNORMAL HIGH (ref 11.0–14.6)

## 2013-01-18 MED ORDER — BORTEZOMIB CHEMO SQ INJECTION 3.5 MG (2.5MG/ML)
3.1000 mg | Freq: Once | INTRAMUSCULAR | Status: AC
  Administered 2013-01-18: 3 mg via SUBCUTANEOUS
  Filled 2013-01-18: qty 3

## 2013-01-18 MED ORDER — ONDANSETRON HCL 8 MG PO TABS
8.0000 mg | ORAL_TABLET | Freq: Once | ORAL | Status: AC
  Administered 2013-01-18: 8 mg via ORAL

## 2013-01-18 NOTE — Telephone Encounter (Signed)
Pt walked back to nurse's desk to ask how many dexamethasone Dr. Gaylyn Rong ordered on last Rx.  He asked for 90 day supply but states pharmacy only gave him a 30 day supply.  Showed him where Dr. Gaylyn Rong had actually ordered #100 (5 month supply). Called his pharmacy.  They said that insurance would only dispense #20 for a 30 day supply but next time he gets refill they will see if they can give him #60 for a 90 day supply.  Pt verbalized understanding.

## 2013-01-18 NOTE — Patient Instructions (Signed)
[facility] Discharge Instructions for Patients Receiving Chemotherapy  Today you received the following chemotherapy agents Velcade.  To help prevent nausea and vomiting after your treatment, we encourage you to take your nausea medication as prescribed.   If you develop nausea and vomiting that is not controlled by your nausea medication, call the clinic. If it is after clinic hours your family physician or the after hours number for the clinic or go to the Emergency Department.   BELOW ARE SYMPTOMS THAT SHOULD BE REPORTED IMMEDIATELY:  *FEVER GREATER THAN 100.5 F  *CHILLS WITH OR WITHOUT FEVER  NAUSEA AND VOMITING THAT IS NOT CONTROLLED WITH YOUR NAUSEA MEDICATION  *UNUSUAL SHORTNESS OF BREATH  *UNUSUAL BRUISING OR BLEEDING  TENDERNESS IN MOUTH AND THROAT WITH OR WITHOUT PRESENCE OF ULCERS  *URINARY PROBLEMS  *BOWEL PROBLEMS  UNUSUAL RASH Items with * indicate a potential emergency and should be followed up as soon as possible.  One of the nurses will contact you 24 hours after your treatment. Please let the nurse know about any problems that you may have experienced. Feel free to call the clinic you have any questions or concerns. The clinic phone number is [phone].   I have been informed and understand all the instructions given to me. I know to contact the clinic, my physician, or go to the Emergency Department if any problems should occur. I do not have any questions at this time, but understand that I may call the clinic during office hours   should I have any questions or need assistance in obtaining follow up care.    __________________________________________  _____________  __________ Signature of Patient or Authorized Representative            Date                   Time    __________________________________________ Nurse's Signature    

## 2013-01-19 LAB — KAPPA/LAMBDA LIGHT CHAINS: Lambda Free Lght Chn: 2.16 mg/dL (ref 0.57–2.63)

## 2013-01-24 ENCOUNTER — Telehealth: Payer: Self-pay | Admitting: Cardiology

## 2013-01-24 NOTE — Telephone Encounter (Signed)
New Problem:    Patient called in wanting to discuss his latest ECHO.  Please call back.

## 2013-01-24 NOTE — Telephone Encounter (Signed)
Left message for pt to call.

## 2013-01-25 ENCOUNTER — Other Ambulatory Visit (HOSPITAL_BASED_OUTPATIENT_CLINIC_OR_DEPARTMENT_OTHER): Payer: Medicare Other

## 2013-01-25 ENCOUNTER — Ambulatory Visit (HOSPITAL_BASED_OUTPATIENT_CLINIC_OR_DEPARTMENT_OTHER): Payer: Medicare Other

## 2013-01-25 VITALS — BP 132/66 | HR 59 | Temp 98.2°F

## 2013-01-25 DIAGNOSIS — E859 Amyloidosis, unspecified: Secondary | ICD-10-CM

## 2013-01-25 DIAGNOSIS — Z5112 Encounter for antineoplastic immunotherapy: Secondary | ICD-10-CM

## 2013-01-25 LAB — CBC WITH DIFFERENTIAL/PLATELET
BASO%: 0.6 % (ref 0.0–2.0)
Basophils Absolute: 0 10*3/uL (ref 0.0–0.1)
EOS%: 2.6 % (ref 0.0–7.0)
HGB: 14 g/dL (ref 13.0–17.1)
MCH: 31.7 pg (ref 27.2–33.4)
MCV: 97.3 fL (ref 79.3–98.0)
MONO%: 13.4 % (ref 0.0–14.0)
RBC: 4.41 10*6/uL (ref 4.20–5.82)
RDW: 14.5 % (ref 11.0–14.6)
lymph#: 1.1 10*3/uL (ref 0.9–3.3)

## 2013-01-25 MED ORDER — BORTEZOMIB CHEMO SQ INJECTION 3.5 MG (2.5MG/ML)
3.1000 mg | Freq: Once | INTRAMUSCULAR | Status: AC
  Administered 2013-01-25: 3 mg via SUBCUTANEOUS
  Filled 2013-01-25: qty 3

## 2013-01-25 MED ORDER — ONDANSETRON HCL 8 MG PO TABS
8.0000 mg | ORAL_TABLET | Freq: Once | ORAL | Status: AC
  Administered 2013-01-25: 8 mg via ORAL

## 2013-01-25 NOTE — Patient Instructions (Signed)
Call MD for problems or concerns 

## 2013-01-30 ENCOUNTER — Telehealth: Payer: Self-pay | Admitting: Pulmonary Disease

## 2013-01-30 MED ORDER — DIPHENOXYLATE-ATROPINE 2.5-0.025 MG PO TABS: ORAL_TABLET | ORAL | Status: DC

## 2013-01-30 NOTE — Telephone Encounter (Signed)
I spoke with pt. He stated he is having watery/loose stools (but more watery than anything). He states he is going about "10 times a day" x the past 2-3 days. No abdominal cramping. He is drinking plenty of fluids. He has not tried anything OTC. He is requesting to have something called in instead of trying something OTC. Please advise SN thanks Last OV 12/19/12 No pending OV No Known Allergies

## 2013-01-30 NOTE — Telephone Encounter (Signed)
Left message for pt to call.

## 2013-01-30 NOTE — Telephone Encounter (Signed)
I spoke with pt and is aware of SN recs. He voiced his understanding.   I called [facility] and gave VO for this. Spoke with Selena Batten. Nothing further was needed

## 2013-01-30 NOTE — Telephone Encounter (Signed)
Per SN----  Ok to call in lomitol  #30  1 po every 4-6 hours as needed for watery diarrhea.   No refills.

## 2013-01-31 ENCOUNTER — Telehealth: Payer: Self-pay | Admitting: *Deleted

## 2013-01-31 ENCOUNTER — Other Ambulatory Visit: Payer: Self-pay | Admitting: Oncology

## 2013-01-31 ENCOUNTER — Telehealth: Payer: Self-pay | Admitting: Oncology

## 2013-01-31 ENCOUNTER — Encounter: Payer: Self-pay | Admitting: *Deleted

## 2013-01-31 DIAGNOSIS — E859 Amyloidosis, unspecified: Secondary | ICD-10-CM

## 2013-01-31 NOTE — Telephone Encounter (Signed)
Patient was calling about previous email on Revlimid. Per Dr Gaylyn Rong, patient is not to get refill on Revlimid until he is seen at [facility] for consult regarding maintanence and dosing increase to 10mg .

## 2013-01-31 NOTE — Telephone Encounter (Signed)
s.w pt and advised on up comming appts...pt aware and ok.Marland KitchenMarland KitchenMelissa add tx

## 2013-02-02 ENCOUNTER — Telehealth: Payer: Self-pay | Admitting: *Deleted

## 2013-02-02 ENCOUNTER — Encounter: Payer: Self-pay | Admitting: Oncology

## 2013-02-02 ENCOUNTER — Other Ambulatory Visit: Payer: Self-pay | Admitting: *Deleted

## 2013-02-02 NOTE — Telephone Encounter (Signed)
Pt called about Revlimid refill,  States due to start it next Thursday.  Informed him the rx on Dr. Lodema Pilot desk to sign and he will be back on Monday. Assured pt this would be plenty of time to order med and for pt to get before Thurs..  He also wanted to ask if dose will be increased to 10 mg?   Pt also wants to know if Bone Marrow Bx will be scheduled soon?  Says [facility] has recommended it.

## 2013-02-02 NOTE — Telephone Encounter (Signed)
THIS REFILL REQUEST FOR REVLIMID WAS GIVEN TO DR.HA'S NURSE, CAMEO Morejon,RN. 

## 2013-02-05 ENCOUNTER — Other Ambulatory Visit: Payer: Self-pay | Admitting: Oncology

## 2013-02-05 NOTE — Telephone Encounter (Signed)
RECEIVED A FAX FROM [facility] CONCERNING A CONFIRMATION OF FACSIMILE RECEIPT FOR PT.'S REFERRAL. 

## 2013-02-06 ENCOUNTER — Other Ambulatory Visit (HOSPITAL_COMMUNITY)
Admission: RE | Admit: 2013-02-06 | Discharge: 2013-02-06 | Disposition: A | Discharge status: Home / Self Care | Payer: Medicare Other | Source: Ambulatory Visit | Attending: Oncology | Admitting: Oncology

## 2013-02-06 ENCOUNTER — Other Ambulatory Visit (HOSPITAL_BASED_OUTPATIENT_CLINIC_OR_DEPARTMENT_OTHER): Payer: Medicare Other | Admitting: Lab

## 2013-02-06 ENCOUNTER — Ambulatory Visit (HOSPITAL_BASED_OUTPATIENT_CLINIC_OR_DEPARTMENT_OTHER): Payer: Medicare Other | Admitting: Oncology

## 2013-02-06 VITALS — BP 140/80 | HR 58 | Temp 97.9°F | Resp 18

## 2013-02-06 DIAGNOSIS — E859 Amyloidosis, unspecified: Secondary | ICD-10-CM

## 2013-02-06 DIAGNOSIS — D649 Anemia, unspecified: Secondary | ICD-10-CM | POA: Diagnosis not present

## 2013-02-06 LAB — CBC WITH DIFFERENTIAL/PLATELET
Basophils Absolute: 0.2 10*3/uL — ABNORMAL HIGH (ref 0.0–0.1)
Eosinophils Absolute: 0.1 10*3/uL (ref 0.0–0.5)
HGB: 13.4 g/dL (ref 13.0–17.1)
MONO#: 0.8 10*3/uL (ref 0.1–0.9)
MONO%: 12.9 % (ref 0.0–14.0)
NEUT#: 4.3 10*3/uL (ref 1.5–6.5)
RBC: 4.19 10*6/uL — ABNORMAL LOW (ref 4.20–5.82)
RDW: 15.7 % — ABNORMAL HIGH (ref 11.0–14.6)
WBC: 6.4 10*3/uL (ref 4.0–10.3)
lymph#: 1 10*3/uL (ref 0.9–3.3)

## 2013-02-06 LAB — BONE MARROW EXAM: Bone Marrow Exam: 139

## 2013-02-06 NOTE — Progress Notes (Signed)
@  0900 bandage checked with nickel size spot of blood, pt getting dressed per dr ha's inst.  Post biopsy inst given, pt verbalized understanding.  No c/o voiced.  dmr

## 2013-02-06 NOTE — Patient Instructions (Addendum)
[facility] Discharge Instructions for Post Bone Marrow Procedure  Today you had a bone marrow biopsy and aspirate of lft hip   Please keep the pressure dressing in place for at least 24 hours.  Have someone check your dressing periodically for bleeding.  If needed you can reapply a pressure dressing to the site.  Take pain medication  as directed.  IF BLEEDING REOCCURS THAT SHOULD BE REPORTED IMMEDIATELY. Call the Cancer Center at [phone] if during business hours. Or report to the Emergency Room.   I have been informed and understand all the instructions given to me. I know to contact the clinic, my physician, or go to the Emergency Department if any problems should occur. I do not have any questions at this time, but understand that I may call the clinic during office hours at [phone]  should I have any questions or need assistance in obtaining follow up care.    __________________________________________  _____________  __________ Signature of Patient or Authorized Representative            Date                   Time    __________________________________________ Nurse's Signature

## 2013-02-06 NOTE — Procedures (Signed)
   [facility]  [phone] [phone]   BONE MARROW BIOPSY AND ASPIRATION   INDICATION:  Restaging for amyloidosis.   Procedure: After obtained from consent, Johnny Mitchell was placed in the prone position. Time out was performed verifying correct patient and procedure. The skin overlying the left posterior crest was prepped with Betadine and draped in the usual sterile fashion. The skin and periosteum were infiltrated with 15 mL of 2% lidocaine. A small puncture wound was made with #11 scalpel blade.  Bone marrow aspirate was obtained on the first pass of the aspiration needle.  One separate core biopsy was obtained through the same incision.   The aspirate was sent for routine histology, flow cytometry, myeloma FISH panel, and cytogenetics.  Core biopsy was sent for routine histology.   Johnny Mitchell tolerated procedure well with minimal  blood loss and without immediate complication.   A sterile dressing was applied.   Jethro Bolus M.D. 02/06/2013

## 2013-02-06 NOTE — Progress Notes (Signed)
Please see bone marrow biopsy dated 02/06/2013.

## 2013-02-08 ENCOUNTER — Other Ambulatory Visit: Payer: Self-pay | Admitting: Oncology

## 2013-02-08 ENCOUNTER — Encounter: Payer: Self-pay | Admitting: Oncology

## 2013-02-08 ENCOUNTER — Other Ambulatory Visit (HOSPITAL_BASED_OUTPATIENT_CLINIC_OR_DEPARTMENT_OTHER): Payer: Medicare Other

## 2013-02-08 ENCOUNTER — Ambulatory Visit (HOSPITAL_BASED_OUTPATIENT_CLINIC_OR_DEPARTMENT_OTHER): Payer: Medicare Other

## 2013-02-08 VITALS — BP 116/73 | HR 56 | Temp 98.0°F

## 2013-02-08 DIAGNOSIS — E859 Amyloidosis, unspecified: Secondary | ICD-10-CM | POA: Diagnosis not present

## 2013-02-08 DIAGNOSIS — Z5112 Encounter for antineoplastic immunotherapy: Secondary | ICD-10-CM

## 2013-02-08 LAB — CBC WITH DIFFERENTIAL/PLATELET
EOS%: 1.3 % (ref 0.0–7.0)
Eosinophils Absolute: 0.1 10*3/uL (ref 0.0–0.5)
LYMPH%: 18.3 % (ref 14.0–49.0)
MCH: 32.1 pg (ref 27.2–33.4)
MCHC: 32.9 g/dL (ref 32.0–36.0)
MCV: 97.6 fL (ref 79.3–98.0)
MONO%: 12.4 % (ref 0.0–14.0)
NEUT#: 4.4 10*3/uL (ref 1.5–6.5)
Platelets: 175 10*3/uL (ref 140–400)
RBC: 4.24 10*6/uL (ref 4.20–5.82)
RDW: 14.8 % — ABNORMAL HIGH (ref 11.0–14.6)
nRBC: 0 % (ref 0–0)

## 2013-02-08 MED ORDER — ONDANSETRON HCL 8 MG PO TABS
8.0000 mg | ORAL_TABLET | Freq: Once | ORAL | Status: AC
  Administered 2013-02-08: 8 mg via ORAL

## 2013-02-08 MED ORDER — BORTEZOMIB CHEMO SQ INJECTION 3.5 MG (2.5MG/ML)
3.1000 mg | Freq: Once | INTRAMUSCULAR | Status: AC
  Administered 2013-02-08: 3 mg via SUBCUTANEOUS
  Filled 2013-02-08: qty 3

## 2013-02-08 NOTE — Patient Instructions (Addendum)

## 2013-02-09 ENCOUNTER — Other Ambulatory Visit: Payer: Self-pay | Admitting: Certified Registered Nurse Anesthetist

## 2013-02-11 ENCOUNTER — Other Ambulatory Visit: Payer: Self-pay | Admitting: Oncology

## 2013-02-14 ENCOUNTER — Other Ambulatory Visit: Payer: Self-pay | Admitting: Oncology

## 2013-02-14 DIAGNOSIS — M4310 Spondylolisthesis, site unspecified: Secondary | ICD-10-CM | POA: Diagnosis not present

## 2013-02-14 DIAGNOSIS — M545 Low back pain: Secondary | ICD-10-CM | POA: Diagnosis not present

## 2013-02-15 ENCOUNTER — Ambulatory Visit (HOSPITAL_BASED_OUTPATIENT_CLINIC_OR_DEPARTMENT_OTHER): Payer: Medicare Other

## 2013-02-15 ENCOUNTER — Other Ambulatory Visit (HOSPITAL_BASED_OUTPATIENT_CLINIC_OR_DEPARTMENT_OTHER): Payer: Medicare Other

## 2013-02-15 VITALS — BP 115/63 | HR 60 | Temp 97.0°F | Resp 20

## 2013-02-15 DIAGNOSIS — E859 Amyloidosis, unspecified: Secondary | ICD-10-CM

## 2013-02-15 DIAGNOSIS — E8589 Other amyloidosis: Secondary | ICD-10-CM | POA: Diagnosis not present

## 2013-02-15 DIAGNOSIS — Z5112 Encounter for antineoplastic immunotherapy: Secondary | ICD-10-CM | POA: Diagnosis not present

## 2013-02-15 LAB — CBC WITH DIFFERENTIAL/PLATELET
BASO%: 1.2 % (ref 0.0–2.0)
Eosinophils Absolute: 0.2 10*3/uL (ref 0.0–0.5)
HCT: 42 % (ref 38.4–49.9)
MCHC: 32.9 g/dL (ref 32.0–36.0)
MONO#: 0.6 10*3/uL (ref 0.1–0.9)
NEUT#: 3.3 10*3/uL (ref 1.5–6.5)
NEUT%: 68 % (ref 39.0–75.0)
Platelets: 157 10*3/uL (ref 140–400)
RBC: 4.33 10*6/uL (ref 4.20–5.82)
WBC: 4.9 10*3/uL (ref 4.0–10.3)
lymph#: 0.8 10*3/uL — ABNORMAL LOW (ref 0.9–3.3)
nRBC: 0 % (ref 0–0)

## 2013-02-15 MED ORDER — ONDANSETRON HCL 8 MG PO TABS
8.0000 mg | ORAL_TABLET | Freq: Once | ORAL | Status: AC
  Administered 2013-02-15: 8 mg via ORAL

## 2013-02-15 MED ORDER — BORTEZOMIB CHEMO SQ INJECTION 3.5 MG (2.5MG/ML)
3.0000 mg | Freq: Once | INTRAMUSCULAR | Status: AC
  Administered 2013-02-15: 3 mg via SUBCUTANEOUS
  Filled 2013-02-15: qty 3

## 2013-02-15 NOTE — Patient Instructions (Addendum)
[facility] Discharge Instructions for Patients Receiving Chemotherapy  Today you received the following chemotherapy agents Velcade.  To help prevent nausea and vomiting after your treatment, we encourage you to take your nausea medication as prescribed.    If you develop nausea and vomiting that is not controlled by your nausea medication, call the clinic. If it is after clinic hours your family physician or the after hours number for the clinic or go to the Emergency Department.   BELOW ARE SYMPTOMS THAT SHOULD BE REPORTED IMMEDIATELY:  *FEVER GREATER THAN 100.5 F  *CHILLS WITH OR WITHOUT FEVER  NAUSEA AND VOMITING THAT IS NOT CONTROLLED WITH YOUR NAUSEA MEDICATION  *UNUSUAL SHORTNESS OF BREATH  *UNUSUAL BRUISING OR BLEEDING  TENDERNESS IN MOUTH AND THROAT WITH OR WITHOUT PRESENCE OF ULCERS  *URINARY PROBLEMS  *BOWEL PROBLEMS  UNUSUAL RASH Items with * indicate a potential emergency and should be followed up as soon as possible.  Please let the nurse know about any problems that you may have experienced. Feel free to call the clinic you have any questions or concerns. The clinic phone number is [phone].   I have been informed and understand all the instructions given to me. I know to contact the clinic, my physician, or go to the Emergency Department if any problems should occur. I do not have any questions at this time, but understand that I may call the clinic during office hours   should I have any questions or need assistance in obtaining follow up care.    __________________________________________  _____________  __________ Signature of Patient or Authorized Representative            Date                   Time    __________________________________________ Nurse's Signature    

## 2013-02-19 ENCOUNTER — Other Ambulatory Visit: Payer: Self-pay | Admitting: Neurological Surgery

## 2013-02-19 DIAGNOSIS — M545 Low back pain: Secondary | ICD-10-CM

## 2013-02-22 ENCOUNTER — Ambulatory Visit (HOSPITAL_BASED_OUTPATIENT_CLINIC_OR_DEPARTMENT_OTHER): Payer: Medicare Other

## 2013-02-22 ENCOUNTER — Other Ambulatory Visit: Payer: Self-pay | Admitting: Oncology

## 2013-02-22 ENCOUNTER — Other Ambulatory Visit (HOSPITAL_BASED_OUTPATIENT_CLINIC_OR_DEPARTMENT_OTHER): Payer: Medicare Other

## 2013-02-22 ENCOUNTER — Other Ambulatory Visit: Payer: Self-pay | Admitting: Pulmonary Disease

## 2013-02-22 VITALS — BP 128/58 | HR 60 | Temp 97.8°F | Resp 20

## 2013-02-22 DIAGNOSIS — Z5112 Encounter for antineoplastic immunotherapy: Secondary | ICD-10-CM | POA: Diagnosis not present

## 2013-02-22 DIAGNOSIS — E859 Amyloidosis, unspecified: Secondary | ICD-10-CM

## 2013-02-22 LAB — CBC WITH DIFFERENTIAL/PLATELET
Basophils Absolute: 0 10*3/uL (ref 0.0–0.1)
Eosinophils Absolute: 0.2 10*3/uL (ref 0.0–0.5)
HCT: 41.8 % (ref 38.4–49.9)
HGB: 13.6 g/dL (ref 13.0–17.1)
NEUT#: 3.3 10*3/uL (ref 1.5–6.5)
NEUT%: 61.7 % (ref 39.0–75.0)
RDW: 15 % — ABNORMAL HIGH (ref 11.0–14.6)
lymph#: 0.7 10*3/uL — ABNORMAL LOW (ref 0.9–3.3)

## 2013-02-22 MED ORDER — GABAPENTIN 300 MG PO CAPS: 300.0000 mg | ORAL_CAPSULE | Freq: Two times a day (BID) | ORAL | Status: DC

## 2013-02-22 MED ORDER — BORTEZOMIB CHEMO SQ INJECTION 3.5 MG (2.5MG/ML)
3.1000 mg | Freq: Once | INTRAMUSCULAR | Status: AC
  Administered 2013-02-22: 3 mg via SUBCUTANEOUS
  Filled 2013-02-22: qty 3

## 2013-02-22 MED ORDER — ONDANSETRON HCL 8 MG PO TABS
8.0000 mg | ORAL_TABLET | Freq: Once | ORAL | Status: AC
  Administered 2013-02-22: 8 mg via ORAL

## 2013-02-22 NOTE — Patient Instructions (Addendum)
[facility] Discharge Instructions for Patients Receiving Chemotherapy  Today you received the following chemotherapy agents; Velcade.    If you develop nausea and vomiting that is not controlled by your nausea medication, call the clinic. If it is after clinic hours your family physician or the after hours number for the clinic or go to the Emergency Department.   BELOW ARE SYMPTOMS THAT SHOULD BE REPORTED IMMEDIATELY:  *FEVER GREATER THAN 100.5 F  *CHILLS WITH OR WITHOUT FEVER  NAUSEA AND VOMITING THAT IS NOT CONTROLLED WITH YOUR NAUSEA MEDICATION  *UNUSUAL SHORTNESS OF BREATH  *UNUSUAL BRUISING OR BLEEDING  TENDERNESS IN MOUTH AND THROAT WITH OR WITHOUT PRESENCE OF ULCERS  *URINARY PROBLEMS  *BOWEL PROBLEMS  UNUSUAL RASH Items with * indicate a potential emergency and should be followed up as soon as possible.  Feel free to call the clinic you have any questions or concerns. The clinic phone number is [phone].   I have been informed and understand all the instructions given to me. I know to contact the clinic, my physician, or go to the Emergency Department if any problems should occur. I do not have any questions at this time, but understand that I may call the clinic during office hours   should I have any questions or need assistance in obtaining follow up care.    __________________________________________  _____________  __________ Signature of Patient or Authorized Representative            Date                   Time    __________________________________________ Nurse's Signature

## 2013-02-27 ENCOUNTER — Ambulatory Visit
Admission: RE | Admit: 2013-02-27 | Discharge: 2013-02-27 | Disposition: A | Discharge status: Home / Self Care | Payer: Medicare Other | Source: Ambulatory Visit | Attending: Neurological Surgery | Admitting: Neurological Surgery

## 2013-02-27 DIAGNOSIS — M545 Low back pain: Secondary | ICD-10-CM

## 2013-02-27 DIAGNOSIS — M4310 Spondylolisthesis, site unspecified: Secondary | ICD-10-CM | POA: Diagnosis not present

## 2013-02-27 MED ORDER — GADOBENATE DIMEGLUMINE 529 MG/ML IV SOLN
20.0000 mL | Freq: Once | INTRAVENOUS | Status: AC | PRN
  Administered 2013-02-27: 20 mL via INTRAVENOUS

## 2013-02-27 NOTE — Progress Notes (Signed)
Received office notes from Bruce Donath, [place] @ [facility]; forwarded to Dr. Gaylyn Rong.

## 2013-02-28 ENCOUNTER — Ambulatory Visit: Payer: Medicare Other | Admitting: Oncology

## 2013-02-28 ENCOUNTER — Other Ambulatory Visit: Payer: Self-pay | Admitting: *Deleted

## 2013-02-28 ENCOUNTER — Other Ambulatory Visit: Payer: Medicare Other

## 2013-02-28 NOTE — Telephone Encounter (Signed)
Refill request for Revlimid from [facility] given to Dr Gaylyn Rong.

## 2013-03-01 ENCOUNTER — Telehealth: Payer: Self-pay | Admitting: *Deleted

## 2013-03-01 DIAGNOSIS — IMO0002 Reserved for concepts with insufficient information to code with codable children: Secondary | ICD-10-CM | POA: Diagnosis not present

## 2013-03-01 DIAGNOSIS — M545 Low back pain: Secondary | ICD-10-CM | POA: Diagnosis not present

## 2013-03-01 NOTE — Telephone Encounter (Signed)
Called pt re missed appt yesterday and also for Revlimid refill needs to do pt survey.  Pt states he saw MD at [facility] this week and they instructed him to stop Revlimid and Velcade and to f/u w/ Dr. Gaylyn Rong with labs once a month.  Says he needs to make appt for labs and thinks his light chains need to be checked.  Informed pt we will not refill revlimid at this time and will notify Dr. Gaylyn Rong and to expect call from scheduling w/ new schedule.  He verbalized understanding.

## 2013-03-05 ENCOUNTER — Other Ambulatory Visit: Payer: Self-pay | Admitting: Neurological Surgery

## 2013-03-05 DIAGNOSIS — M545 Low back pain: Secondary | ICD-10-CM

## 2013-03-06 ENCOUNTER — Telehealth: Payer: Self-pay | Admitting: *Deleted

## 2013-03-06 ENCOUNTER — Ambulatory Visit
Admission: RE | Admit: 2013-03-06 | Discharge: 2013-03-06 | Disposition: A | Discharge status: Home / Self Care | Payer: Medicare Other | Source: Ambulatory Visit | Attending: Neurological Surgery | Admitting: Neurological Surgery

## 2013-03-06 ENCOUNTER — Other Ambulatory Visit: Payer: Self-pay | Admitting: Oncology

## 2013-03-06 ENCOUNTER — Other Ambulatory Visit: Payer: Self-pay | Admitting: *Deleted

## 2013-03-06 ENCOUNTER — Inpatient Hospital Stay: Admission: RE | Admit: 2013-03-06 | Payer: Medicare Other | Source: Ambulatory Visit

## 2013-03-06 DIAGNOSIS — E859 Amyloidosis, unspecified: Secondary | ICD-10-CM

## 2013-03-06 DIAGNOSIS — M4310 Spondylolisthesis, site unspecified: Secondary | ICD-10-CM | POA: Diagnosis not present

## 2013-03-06 DIAGNOSIS — M545 Low back pain: Secondary | ICD-10-CM

## 2013-03-06 DIAGNOSIS — M5126 Other intervertebral disc displacement, lumbar region: Secondary | ICD-10-CM | POA: Diagnosis not present

## 2013-03-06 MED ORDER — LENALIDOMIDE 10 MG PO CAPS: 10.0000 mg | ORAL_CAPSULE | Freq: Every day | ORAL | Status: DC

## 2013-03-06 NOTE — Telephone Encounter (Signed)
Per Dr. Gaylyn Rong, he read in note from [facility] to continue Velcade/Dex/Revlimid for several more cycles,  NOT d/c as pt is stating.   Obtained note from [facility] from office visit 02/22/13 from [place], [place].  It states plan to conintue V/D/R for 1 to 2 more cycles and see pt again in 2 months to evaluate response.  Pt is due to start next cycle on 03/08/13.   Called pt and informed him of note from [facility] and he said he thought they told him to stop but he is willing to continue for now as recommended.  Informed him will re-order Revlimid today and for him to keep appt for Velcade on 3/27 and start Revlimid on 3/27 for next cycle.  He verbalized understanding and agreement.

## 2013-03-07 ENCOUNTER — Other Ambulatory Visit: Payer: Self-pay | Admitting: Oncology

## 2013-03-07 DIAGNOSIS — E859 Amyloidosis, unspecified: Secondary | ICD-10-CM

## 2013-03-08 ENCOUNTER — Other Ambulatory Visit (HOSPITAL_BASED_OUTPATIENT_CLINIC_OR_DEPARTMENT_OTHER): Payer: Medicare Other | Admitting: Lab

## 2013-03-08 ENCOUNTER — Ambulatory Visit (HOSPITAL_BASED_OUTPATIENT_CLINIC_OR_DEPARTMENT_OTHER): Payer: Medicare Other

## 2013-03-08 VITALS — BP 148/89 | HR 61 | Temp 97.8°F | Resp 20

## 2013-03-08 DIAGNOSIS — Z5112 Encounter for antineoplastic immunotherapy: Secondary | ICD-10-CM

## 2013-03-08 DIAGNOSIS — E859 Amyloidosis, unspecified: Secondary | ICD-10-CM | POA: Diagnosis not present

## 2013-03-08 DIAGNOSIS — IMO0002 Reserved for concepts with insufficient information to code with codable children: Secondary | ICD-10-CM | POA: Diagnosis not present

## 2013-03-08 LAB — CBC WITH DIFFERENTIAL/PLATELET
Basophils Absolute: 0 10*3/uL (ref 0.0–0.1)
Eosinophils Absolute: 0 10*3/uL (ref 0.0–0.5)
HCT: 38.5 % (ref 38.4–49.9)
HGB: 12.5 g/dL — ABNORMAL LOW (ref 13.0–17.1)
LYMPH%: 7 % — ABNORMAL LOW (ref 14.0–49.0)
MONO#: 1.1 10*3/uL — ABNORMAL HIGH (ref 0.1–0.9)
NEUT#: 7.4 10*3/uL — ABNORMAL HIGH (ref 1.5–6.5)
NEUT%: 80.8 % — ABNORMAL HIGH (ref 39.0–75.0)
Platelets: 183 10*3/uL (ref 140–400)
WBC: 9.2 10*3/uL (ref 4.0–10.3)

## 2013-03-08 LAB — BASIC METABOLIC PANEL (CC13)
CO2: 28 mEq/L (ref 22–29)
Chloride: 104 mEq/L (ref 98–107)
Glucose: 106 mg/dl — ABNORMAL HIGH (ref 70–99)
Potassium: 4.1 mEq/L (ref 3.5–5.1)
Sodium: 143 mEq/L (ref 136–145)

## 2013-03-08 MED ORDER — ONDANSETRON HCL 8 MG PO TABS
8.0000 mg | ORAL_TABLET | Freq: Once | ORAL | Status: AC
  Administered 2013-03-08: 8 mg via ORAL

## 2013-03-08 MED ORDER — BORTEZOMIB CHEMO SQ INJECTION 3.5 MG (2.5MG/ML)
3.1000 mg | Freq: Once | INTRAMUSCULAR | Status: AC
  Administered 2013-03-08: 3 mg via SUBCUTANEOUS
  Filled 2013-03-08: qty 3

## 2013-03-08 NOTE — Patient Instructions (Addendum)
[facility] Discharge Instructions for Patients Receiving Chemotherapy  Today you received the following chemotherapy agents velcade  To help prevent nausea and vomiting after your treatment, we encourage you to take your nausea medication and take it as often as prescribed   If you develop nausea and vomiting that is not controlled by your nausea medication, call the clinic. If it is after clinic hours your family physician or the after hours number for the clinic or go to the Emergency Department.   BELOW ARE SYMPTOMS THAT SHOULD BE REPORTED IMMEDIATELY:  *FEVER GREATER THAN 100.5 F  *CHILLS WITH OR WITHOUT FEVER  NAUSEA AND VOMITING THAT IS NOT CONTROLLED WITH YOUR NAUSEA MEDICATION  *UNUSUAL SHORTNESS OF BREATH  *UNUSUAL BRUISING OR BLEEDING  TENDERNESS IN MOUTH AND THROAT WITH OR WITHOUT PRESENCE OF ULCERS  *URINARY PROBLEMS  *BOWEL PROBLEMS  UNUSUAL RASH Items with * indicate a potential emergency and should be followed up as soon as possible.  One of the nurses will contact you 24 hours after your treatment. Please let the nurse know about any problems that you may have experienced. Feel free to call the clinic you have any questions or concerns. The clinic phone number is [phone].   I have been informed and understand all the instructions given to me. I know to contact the clinic, my physician, or go to the Emergency Department if any problems should occur. I do not have any questions at this time, but understand that I may call the clinic during office hours   should I have any questions or need assistance in obtaining follow up care.    __________________________________________  _____________  __________ Signature of Patient or Authorized Representative            Date                   Time    __________________________________________ Nurse's Signature    

## 2013-03-09 ENCOUNTER — Other Ambulatory Visit: Payer: Self-pay | Admitting: Oncology

## 2013-03-09 ENCOUNTER — Telehealth: Payer: Self-pay | Admitting: *Deleted

## 2013-03-09 LAB — KAPPA/LAMBDA LIGHT CHAINS: Kappa free light chain: 2.76 mg/dL — ABNORMAL HIGH (ref 0.33–1.94)

## 2013-03-09 NOTE — Telephone Encounter (Signed)
I'll turn in a POF for him to see Belenda Cruise.  He can take Lasix 20mg  PO BID prn.  Thanks.

## 2013-03-09 NOTE — Telephone Encounter (Signed)
Left VM for pt w/ Dr. Lodema Pilot reply below.  Instructed to call back if any questions and to expect call from  Scheduling to schedule office visit for 4/10 w/ Clenton Pare.  Also instructed him to keep his feet elevated as much as possible to help reduce swelling.

## 2013-03-09 NOTE — Telephone Encounter (Signed)
Pt left VM states he started another cycle of Velcade, revlimid and decadron on 3/27.  He noticed his chemo appts are scheduled but no office visits.  When does he need office visit?  He would like to be seen soon if possible due to c/o increase swelling in feet and ankles.

## 2013-03-12 ENCOUNTER — Encounter: Payer: Self-pay | Admitting: Oncology

## 2013-03-12 ENCOUNTER — Telehealth: Payer: Self-pay | Admitting: Oncology

## 2013-03-12 NOTE — Telephone Encounter (Signed)
Added f/u 4/10. lmonvm for pt w/new time for 4/10 @ 8:45am. Also confirmed 4/3 appt and pt to get new schedule 4/3. Per 3/28 pof disregard 3/25 pof.

## 2013-03-14 ENCOUNTER — Other Ambulatory Visit: Payer: Self-pay | Admitting: Oncology

## 2013-03-14 DIAGNOSIS — E859 Amyloidosis, unspecified: Secondary | ICD-10-CM

## 2013-03-15 ENCOUNTER — Other Ambulatory Visit (HOSPITAL_BASED_OUTPATIENT_CLINIC_OR_DEPARTMENT_OTHER): Payer: Medicare Other | Admitting: Lab

## 2013-03-15 ENCOUNTER — Ambulatory Visit (HOSPITAL_BASED_OUTPATIENT_CLINIC_OR_DEPARTMENT_OTHER): Payer: Medicare Other

## 2013-03-15 DIAGNOSIS — E859 Amyloidosis, unspecified: Secondary | ICD-10-CM | POA: Diagnosis not present

## 2013-03-15 DIAGNOSIS — Z5112 Encounter for antineoplastic immunotherapy: Secondary | ICD-10-CM

## 2013-03-15 LAB — CBC WITH DIFFERENTIAL/PLATELET
BASO%: 0.8 % (ref 0.0–2.0)
HCT: 41.3 % (ref 38.4–49.9)
LYMPH%: 20.2 % (ref 14.0–49.0)
MCHC: 32.2 g/dL (ref 32.0–36.0)
MONO#: 0.8 10*3/uL (ref 0.1–0.9)
NEUT%: 62.1 % (ref 39.0–75.0)
Platelets: 164 10*3/uL (ref 140–400)
WBC: 5.3 10*3/uL (ref 4.0–10.3)

## 2013-03-15 MED ORDER — ONDANSETRON HCL 8 MG PO TABS
8.0000 mg | ORAL_TABLET | Freq: Once | ORAL | Status: AC
  Administered 2013-03-15: 8 mg via ORAL

## 2013-03-15 MED ORDER — BORTEZOMIB CHEMO SQ INJECTION 3.5 MG (2.5MG/ML)
3.1000 mg | Freq: Once | INTRAMUSCULAR | Status: AC
  Administered 2013-03-15: 3 mg via SUBCUTANEOUS
  Filled 2013-03-15: qty 3

## 2013-03-15 NOTE — Patient Instructions (Signed)
[facility] Discharge Instructions for Patients Receiving Chemotherapy  Today you received the following chemotherapy agents VELCADE To help prevent nausea and vomiting after your treatment, we encourage you to take your nausea medication   Take it as often as prescribed.   If you develop nausea and vomiting that is not controlled by your nausea medication, call the clinic. If it is after clinic hours your family physician or the after hours number for the clinic or go to the Emergency Department.   BELOW ARE SYMPTOMS THAT SHOULD BE REPORTED IMMEDIATELY:  *FEVER GREATER THAN 100.5 F  *CHILLS WITH OR WITHOUT FEVER  NAUSEA AND VOMITING THAT IS NOT CONTROLLED WITH YOUR NAUSEA MEDICATION  *UNUSUAL SHORTNESS OF BREATH  *UNUSUAL BRUISING OR BLEEDING  TENDERNESS IN MOUTH AND THROAT WITH OR WITHOUT PRESENCE OF ULCERS  *URINARY PROBLEMS  *BOWEL PROBLEMS  UNUSUAL RASH Items with * indicate a potential emergency and should be followed up as soon as possible.  If this is your first treatment one of the nurses will contact you 24 hours after your treatment. Please let the nurse know about any problems that you may have experienced. Feel free to call the clinic you have any questions or concerns. The clinic phone number is [phone].   I have been informed and understand all the instructions given to me. I know to contact the clinic, my physician, or go to the Emergency Department if any problems should occur. I do not have any questions at this time, but understand that I may call the clinic during office hours   should I have any questions or need assistance in obtaining follow up care.    __________________________________________  _____________  __________ Signature of Patient or Authorized Representative            Date                   Time    __________________________________________ Nurse's Signature     

## 2013-03-19 DIAGNOSIS — S81809A Unspecified open wound, unspecified lower leg, initial encounter: Secondary | ICD-10-CM | POA: Diagnosis not present

## 2013-03-19 DIAGNOSIS — S81009A Unspecified open wound, unspecified knee, initial encounter: Secondary | ICD-10-CM | POA: Diagnosis not present

## 2013-03-21 ENCOUNTER — Other Ambulatory Visit: Payer: Self-pay | Admitting: *Deleted

## 2013-03-21 ENCOUNTER — Other Ambulatory Visit: Payer: Self-pay | Admitting: Pulmonary Disease

## 2013-03-21 DIAGNOSIS — S91009A Unspecified open wound, unspecified ankle, initial encounter: Secondary | ICD-10-CM | POA: Diagnosis not present

## 2013-03-22 ENCOUNTER — Encounter: Payer: Self-pay | Admitting: Oncology

## 2013-03-22 ENCOUNTER — Telehealth: Payer: Self-pay | Admitting: Oncology

## 2013-03-22 ENCOUNTER — Other Ambulatory Visit (HOSPITAL_BASED_OUTPATIENT_CLINIC_OR_DEPARTMENT_OTHER): Payer: Medicare Other | Admitting: Lab

## 2013-03-22 ENCOUNTER — Ambulatory Visit (HOSPITAL_BASED_OUTPATIENT_CLINIC_OR_DEPARTMENT_OTHER): Payer: Medicare Other

## 2013-03-22 ENCOUNTER — Ambulatory Visit: Payer: Medicare Other

## 2013-03-22 ENCOUNTER — Other Ambulatory Visit: Payer: Medicare Other | Admitting: Lab

## 2013-03-22 ENCOUNTER — Ambulatory Visit (HOSPITAL_BASED_OUTPATIENT_CLINIC_OR_DEPARTMENT_OTHER): Payer: Medicare Other | Admitting: Oncology

## 2013-03-22 VITALS — BP 135/76 | HR 63 | Temp 97.7°F | Resp 18 | Ht 71.0 in | Wt 270.0 lb

## 2013-03-22 DIAGNOSIS — R609 Edema, unspecified: Secondary | ICD-10-CM

## 2013-03-22 DIAGNOSIS — E859 Amyloidosis, unspecified: Secondary | ICD-10-CM | POA: Diagnosis not present

## 2013-03-22 DIAGNOSIS — Z5112 Encounter for antineoplastic immunotherapy: Secondary | ICD-10-CM | POA: Diagnosis not present

## 2013-03-22 DIAGNOSIS — I10 Essential (primary) hypertension: Secondary | ICD-10-CM

## 2013-03-22 LAB — CBC WITH DIFFERENTIAL/PLATELET
Basophils Absolute: 0.1 10*3/uL (ref 0.0–0.1)
EOS%: 0.6 % (ref 0.0–7.0)
HCT: 40.9 % (ref 38.4–49.9)
HGB: 13 g/dL (ref 13.0–17.1)
LYMPH%: 14.8 % (ref 14.0–49.0)
MCH: 31.7 pg (ref 27.2–33.4)
MCV: 99.8 fL — ABNORMAL HIGH (ref 79.3–98.0)
MONO%: 15.8 % — ABNORMAL HIGH (ref 0.0–14.0)
NEUT%: 67.3 % (ref 39.0–75.0)

## 2013-03-22 MED ORDER — BORTEZOMIB CHEMO SQ INJECTION 3.5 MG (2.5MG/ML)
3.1000 mg | Freq: Once | INTRAMUSCULAR | Status: AC
  Administered 2013-03-22: 3 mg via SUBCUTANEOUS
  Filled 2013-03-22: qty 3

## 2013-03-22 MED ORDER — ONDANSETRON HCL 8 MG PO TABS
8.0000 mg | ORAL_TABLET | Freq: Once | ORAL | Status: AC
  Administered 2013-03-22: 8 mg via ORAL

## 2013-03-22 NOTE — Telephone Encounter (Signed)
gv pt appt schedule for April and May.

## 2013-03-22 NOTE — Patient Instructions (Signed)
[facility] Discharge Instructions for Patients Receiving Chemotherapy  Today you received the following chemotherapy agents :  Velcade.  To help prevent nausea and vomiting after your treatment, we encourage you to take your nausea medication as instructed by your physician.    If you develop nausea and vomiting that is not controlled by your nausea medication, call the clinic. If it is after clinic hours your family physician or the after hours number for the clinic or go to the Emergency Department.   BELOW ARE SYMPTOMS THAT SHOULD BE REPORTED IMMEDIATELY:  *FEVER GREATER THAN 100.5 F  *CHILLS WITH OR WITHOUT FEVER  NAUSEA AND VOMITING THAT IS NOT CONTROLLED WITH YOUR NAUSEA MEDICATION  *UNUSUAL SHORTNESS OF BREATH  *UNUSUAL BRUISING OR BLEEDING  TENDERNESS IN MOUTH AND THROAT WITH OR WITHOUT PRESENCE OF ULCERS  *URINARY PROBLEMS  *BOWEL PROBLEMS  UNUSUAL RASH Items with * indicate a potential emergency and should be followed up as soon as possible.  One of the nurses will contact you 24 hours after your treatment. Please let the nurse know about any problems that you may have experienced. Feel free to call the clinic you have any questions or concerns. The clinic phone number is [phone].   I have been informed and understand all the instructions given to me. I know to contact the clinic, my physician, or go to the Emergency Department if any problems should occur. I do not have any questions at this time, but understand that I may call the clinic during office hours   should I have any questions or need assistance in obtaining follow up care.    __________________________________________  _____________  __________ Signature of Patient or Authorized Representative            Date                   Time    __________________________________________ Nurse's Signature    

## 2013-03-22 NOTE — Progress Notes (Signed)
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 Negative cytogenetics and FISH myeloma panel. Cardiac MRI showed no sign of cardiac dysfunction.   PAST TREATMENT: Velcade, dexamethasone, melphalan x 3 cycles. 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   CURRENT THERAPY: started salvage Velcade for persistent serum free light chain on 09/17/2010 (3 weeks on, 1 week off). His regimen was modified to include Revlimid 5mg  PO d1-21 every 4 weeks and Dexamathasone 10mg  PO qweek in April 2013 to due slightly elevated light chain.    INTERVAL HISTORY: Johnny Mitchell 72 y.o. male returns for regular follow up by himself.  The patient was seen at [facility] in March. There was recommended that he continue his chemotherapy for approximately one to 2 more cycles. The patient is seen today and states that he stopped his Revlimid about a week ago due to lower extremity edema. He states the edema is better since stopping this medication. He does not want to reduce the dose of this medication. He prefers to receive only Velcade and dexamethasone. He denied fever, hemoptysis, chest pain, SOB, DOE.    He is concerned that his light chain is not decreasing any further albeit that the ratio is still approaching normal range.  He queried if his Revlimid can be increased.  He denied side effects of chemo.  He denied anorexia, weight loss, fatigue, headache, visual changes, confusion, drenching night sweats, palpable lymph node swelling, mucositis, odynophagia, dysphagia, nausea vomiting, jaundice, chest pain, palpitation, gum bleeding, epistaxis, hematemesis, hemoptysis, abdominal pain, abdominal swelling, early satiety, melena, hematochezia,  hematuria, skin rash, spontaneous bleeding, joint swelling, joint pain, heat or cold intolerance, bowel bladder incontinence, focal motor weakness, paresthesia, depression.     Past Medical History  Diagnosis Date  . Hypertension   . Coronary atherosclerosis of unspecified type of vessel, native or graft   . Cerebrovascular disease, unspecified   . Pure hypercholesterolemia   . Diverticulosis of colon (without mention of hemorrhage)   . Benign neoplasm of colon   . Unspecified hemorrhoids without mention of complication   . Degenerative disc disease   . 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 Low back pain   . Hemangioma   . Amyloidosis   . LBBB (left bundle branch block)   . 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 OSA (obstructive sleep apnea)   . Pedal edema 05/11/2012    Past Surgical History  Procedure Laterality Date  . Knee arthroscopy    . Carpal tunnel release  08/2006    Dr Thad Ranger at [facility] - Bilateral  . Bone marrow transplant  june 2011    Current Outpatient Prescriptions  Medication Sig Dispense Refill  . aspirin EC 81 MG tablet Take 1 tablet (81 mg total) by mouth daily.  90 tablet  3  . atenolol (TENORMIN) 100 MG tablet TAKE ONE TABLET BY MOUTH EVERY DAY  90 tablet  2  . dexamethasone (DECADRON) 2 MG tablet Take 5 tablets (10 mg total) by mouth once a week.  100 tablet  3  . furosemide (LASIX) 40 MG tablet TAKE ONE TABLET BY MOUTH TWICE DAILY AS DIRECTED  180 tablet  0  .  NON FORMULARY Injection for chemo As directed       No current facility-administered medications for this visit.    ALLERGIES:  has No Known Allergies.  REVIEW OF SYSTEMS:  The rest of the 14-point review of system was negative.   Filed Vitals:   03/22/13 0919  BP: 135/76  Pulse: 63  Temp: 97.7 F (36.5 C)  Resp: 18   Wt Readings from Last 3 Encounters:  03/22/13 270 lb (122.471 kg)  01/11/13 274 lb 6.4 oz (124.467 kg)   12/19/12 281 lb (127.461 kg)   ECOG Performance status: 1  PHYSICAL EXAMINATION:   General:  Mildly obese man, in no acute distress.  Eyes:  no scleral icterus.  ENT:  There were no oropharyngeal lesions.  Neck was without thyromegaly.  Lymphatics:  Negative cervical, supraclavicular or axillary adenopathy.  Respiratory: lungs were clear bilaterally without wheezing or crackles.  Cardiovascular:  Regular rate and rhythm, S1/S2, without murmur, rub or gallop.  There was 1+ bilateral pedal edema.  GI:  abdomen was soft, flat, nontender, nondistended, without organomegaly.  Muscoloskeletal:  no spinal tenderness of palpation of vertebral spine.  Skin exam was without echymosis, petichae.  Neuro exam was nonfocal.  Patient was able to get on and off exam table without assistance.  Gait was normal.  Patient was alerted and oriented.  Attention was good.   Language was appropriate.  Mood was normal without depression.  Speech was not pressured.  Thought content was not tangential.      LABORATORY/RADIOLOGY DATA:  Lab Results  Component Value Date   WBC 7.8 03/22/2013   HGB 13.0 03/22/2013   HCT 40.9 03/22/2013   PLT 163 03/22/2013   GLUCOSE 106* 03/08/2013   CHOL 155 01/15/2009   TRIG 77 01/15/2009   HDL 39.9 01/15/2009   LDLCALC 100* 01/15/2009   ALKPHOS 113 12/14/2012   ALT 15 12/14/2012   AST 11 12/14/2012   NA 143 03/08/2013   K 4.1 03/08/2013   CL 104 03/08/2013   CREATININE 1.1 03/08/2013   BUN 27.1* 03/08/2013   CO2 28 03/08/2013   PSA 0.81 01/15/2009   INR 1.02 09/30/2011    ASSESSMENT AND PLAN:   1. History of amyloidosis: He has been on maintenance Velcade with the addition of Revlimid and Dexamethasone in April 2013. His light chain has been stable. He no longer wishes to take Revlimid but is in agreement to staying on the Velcade and dexamethasone. Recommend that he continue the Velcade and dexamethasone without dose modification. He receives this 3 weeks on and one-week off. He states that he has  a followup with [facility] in June 2014 and I have advised him to contact their office to see if they wish to see him sooner. 2. Pedal edema. Possibly secondary to venous stasis versus Revlimid; better with stopping the Revlimid. I have encouraged him to use the Lasix and elevate his legs as much as possible..  3. Hypertension. Well controlled on atenolol and Lasix per Dr. Jens Som. 4. Chronic lower back pain: recent operation with Ortho from [place] with significant improvement. He is on oxycone qday prn.  5. Follow up: Velcade SQ (3wks on, 1 wk off). Return visit in about 1 month.      The length of time of the face-to-face encounter was 25  minutes. More than 50% of time was spent counseling and coordination of care.

## 2013-03-29 ENCOUNTER — Other Ambulatory Visit: Payer: Medicare Other | Admitting: Lab

## 2013-04-04 DIAGNOSIS — S81009A Unspecified open wound, unspecified knee, initial encounter: Secondary | ICD-10-CM | POA: Diagnosis not present

## 2013-04-04 DIAGNOSIS — Z4802 Encounter for removal of sutures: Secondary | ICD-10-CM | POA: Diagnosis not present

## 2013-04-04 DIAGNOSIS — S91009A Unspecified open wound, unspecified ankle, initial encounter: Secondary | ICD-10-CM | POA: Diagnosis not present

## 2013-04-05 ENCOUNTER — Other Ambulatory Visit (HOSPITAL_BASED_OUTPATIENT_CLINIC_OR_DEPARTMENT_OTHER): Payer: Medicare Other

## 2013-04-05 ENCOUNTER — Ambulatory Visit (HOSPITAL_BASED_OUTPATIENT_CLINIC_OR_DEPARTMENT_OTHER): Payer: Medicare Other

## 2013-04-05 VITALS — BP 120/57 | HR 65 | Temp 98.6°F | Resp 20

## 2013-04-05 DIAGNOSIS — D72829 Elevated white blood cell count, unspecified: Secondary | ICD-10-CM | POA: Diagnosis not present

## 2013-04-05 DIAGNOSIS — E859 Amyloidosis, unspecified: Secondary | ICD-10-CM

## 2013-04-05 DIAGNOSIS — Z5112 Encounter for antineoplastic immunotherapy: Secondary | ICD-10-CM

## 2013-04-05 LAB — CBC WITH DIFFERENTIAL/PLATELET
BASO%: 0.7 % (ref 0.0–2.0)
LYMPH%: 9.2 % — ABNORMAL LOW (ref 14.0–49.0)
MCHC: 32.7 g/dL (ref 32.0–36.0)
MCV: 97.5 fL (ref 79.3–98.0)
MONO%: 9.5 % (ref 0.0–14.0)
Platelets: 175 10*3/uL (ref 140–400)
RBC: 4.15 10*6/uL — ABNORMAL LOW (ref 4.20–5.82)

## 2013-04-05 LAB — COMPREHENSIVE METABOLIC PANEL (CC13)
Albumin: 2.9 g/dL — ABNORMAL LOW (ref 3.5–5.0)
Alkaline Phosphatase: 130 U/L (ref 40–150)
BUN: 17.2 mg/dL (ref 7.0–26.0)
CO2: 25 mEq/L (ref 22–29)
Glucose: 143 mg/dl — ABNORMAL HIGH (ref 70–99)
Potassium: 4 mEq/L (ref 3.5–5.1)
Total Bilirubin: 0.28 mg/dL (ref 0.20–1.20)

## 2013-04-05 MED ORDER — ONDANSETRON HCL 8 MG PO TABS
8.0000 mg | ORAL_TABLET | Freq: Once | ORAL | Status: AC
  Administered 2013-04-05: 8 mg via ORAL

## 2013-04-05 MED ORDER — BORTEZOMIB CHEMO SQ INJECTION 3.5 MG (2.5MG/ML)
3.1000 mg | Freq: Once | INTRAMUSCULAR | Status: AC
  Administered 2013-04-05: 3 mg via SUBCUTANEOUS
  Filled 2013-04-05: qty 3

## 2013-04-05 NOTE — Patient Instructions (Signed)
Patient aware of next appointment; discharged home with no complaints. 

## 2013-04-06 LAB — KAPPA/LAMBDA LIGHT CHAINS: Kappa free light chain: 2.33 mg/dL — ABNORMAL HIGH (ref 0.33–1.94)

## 2013-04-10 ENCOUNTER — Telehealth: Payer: Self-pay | Admitting: Cardiology

## 2013-04-10 DIAGNOSIS — L02419 Cutaneous abscess of limb, unspecified: Secondary | ICD-10-CM | POA: Diagnosis not present

## 2013-04-10 NOTE — Telephone Encounter (Signed)
New problem     Patient had recent surgery having some problems. Advise patient to call the outpatient surgeon office to discuss his issues he having.  Patient understood. However, wanted Johnny Mitchell to call him back.

## 2013-04-10 NOTE — Telephone Encounter (Signed)
Spoke with pt, he fell about three weeks ago and had to have twelve stitches. The stitches were removed about one week ago. Now he is getting swelling in the leg. It usually goes down over night but by the end of the day has returned. Encouraged pt to go back to urgent care where he was first seen. Pt voiced understanding.

## 2013-04-12 ENCOUNTER — Ambulatory Visit (HOSPITAL_BASED_OUTPATIENT_CLINIC_OR_DEPARTMENT_OTHER): Payer: Medicare Other

## 2013-04-12 ENCOUNTER — Other Ambulatory Visit (HOSPITAL_BASED_OUTPATIENT_CLINIC_OR_DEPARTMENT_OTHER): Payer: Medicare Other | Admitting: Lab

## 2013-04-12 VITALS — BP 142/82 | HR 61 | Temp 98.1°F | Resp 20

## 2013-04-12 DIAGNOSIS — E859 Amyloidosis, unspecified: Secondary | ICD-10-CM | POA: Diagnosis not present

## 2013-04-12 DIAGNOSIS — L02419 Cutaneous abscess of limb, unspecified: Secondary | ICD-10-CM | POA: Diagnosis not present

## 2013-04-12 DIAGNOSIS — Z5112 Encounter for antineoplastic immunotherapy: Secondary | ICD-10-CM

## 2013-04-12 DIAGNOSIS — L03119 Cellulitis of unspecified part of limb: Secondary | ICD-10-CM | POA: Diagnosis not present

## 2013-04-12 LAB — CBC WITH DIFFERENTIAL/PLATELET
Basophils Absolute: 0 10*3/uL (ref 0.0–0.1)
Eosinophils Absolute: 0.1 10*3/uL (ref 0.0–0.5)
HGB: 13.3 g/dL (ref 13.0–17.1)
LYMPH%: 13.9 % — ABNORMAL LOW (ref 14.0–49.0)
MCV: 98.5 fL — ABNORMAL HIGH (ref 79.3–98.0)
MONO%: 9.7 % (ref 0.0–14.0)
NEUT#: 6.1 10*3/uL (ref 1.5–6.5)
Platelets: 138 10*3/uL — ABNORMAL LOW (ref 140–400)
RBC: 4.13 10*6/uL — ABNORMAL LOW (ref 4.20–5.82)

## 2013-04-12 MED ORDER — BORTEZOMIB CHEMO SQ INJECTION 3.5 MG (2.5MG/ML)
3.1000 mg | Freq: Once | INTRAMUSCULAR | Status: AC
  Administered 2013-04-12: 3 mg via SUBCUTANEOUS
  Filled 2013-04-12: qty 3

## 2013-04-12 MED ORDER — ONDANSETRON HCL 8 MG PO TABS
8.0000 mg | ORAL_TABLET | Freq: Once | ORAL | Status: AC
  Administered 2013-04-12: 8 mg via ORAL

## 2013-04-12 NOTE — Patient Instructions (Signed)
[facility] Discharge Instructions for Patients Receiving Chemotherapy  Today you received the following chemotherapy agents: Velcade. To help prevent nausea and vomiting after your treatment, we encourage you to take your nausea medication as needed.  If you develop nausea and vomiting that is not controlled by your nausea medication, call the clinic. If it is after clinic hours your family physician or the after hours number for the clinic or go to the Emergency Department.   BELOW ARE SYMPTOMS THAT SHOULD BE REPORTED IMMEDIATELY:  *FEVER GREATER THAN 100.5 F  *CHILLS WITH OR WITHOUT FEVER  NAUSEA AND VOMITING THAT IS NOT CONTROLLED WITH YOUR NAUSEA MEDICATION  *UNUSUAL SHORTNESS OF BREATH  *UNUSUAL BRUISING OR BLEEDING  TENDERNESS IN MOUTH AND THROAT WITH OR WITHOUT PRESENCE OF ULCERS  *URINARY PROBLEMS  *BOWEL PROBLEMS  UNUSUAL RASH Items with * indicate a potential emergency and should be followed up as soon as possible.  Feel free to call the clinic you have any questions or concerns. The clinic phone number is [phone].   I have been informed and understand all the instructions given to me. I know to contact the clinic, my physician, or go to the Emergency Department if any problems should occur. I do not have any questions at this time, but understand that I may call the clinic during office hours   should I have any questions or need assistance in obtaining follow up care.    

## 2013-04-19 ENCOUNTER — Ambulatory Visit (HOSPITAL_BASED_OUTPATIENT_CLINIC_OR_DEPARTMENT_OTHER): Payer: Medicare Other

## 2013-04-19 ENCOUNTER — Other Ambulatory Visit (HOSPITAL_BASED_OUTPATIENT_CLINIC_OR_DEPARTMENT_OTHER): Payer: Medicare Other

## 2013-04-19 VITALS — BP 130/76 | HR 67 | Temp 97.4°F | Resp 20

## 2013-04-19 DIAGNOSIS — Z5112 Encounter for antineoplastic immunotherapy: Secondary | ICD-10-CM

## 2013-04-19 DIAGNOSIS — E859 Amyloidosis, unspecified: Secondary | ICD-10-CM

## 2013-04-19 LAB — CBC WITH DIFFERENTIAL/PLATELET
BASO%: 0.4 % (ref 0.0–2.0)
LYMPH%: 12.6 % — ABNORMAL LOW (ref 14.0–49.0)
MCHC: 32.6 g/dL (ref 32.0–36.0)
MCV: 98.2 fL — ABNORMAL HIGH (ref 79.3–98.0)
MONO%: 2 % (ref 0.0–14.0)
Platelets: 168 10*3/uL (ref 140–400)
RBC: 4.4 10*6/uL (ref 4.20–5.82)
nRBC: 0 % (ref 0–0)

## 2013-04-19 MED ORDER — ONDANSETRON HCL 8 MG PO TABS
8.0000 mg | ORAL_TABLET | Freq: Once | ORAL | Status: AC
  Administered 2013-04-19: 8 mg via ORAL

## 2013-04-19 MED ORDER — BORTEZOMIB CHEMO SQ INJECTION 3.5 MG (2.5MG/ML)
3.1000 mg | Freq: Once | INTRAMUSCULAR | Status: AC
  Administered 2013-04-19: 3 mg via SUBCUTANEOUS
  Filled 2013-04-19: qty 3

## 2013-04-19 NOTE — Patient Instructions (Signed)
[facility] Discharge Instructions for Patients Receiving Chemotherapy  Today you received the following chemotherapy agents :  Velcade.  To help prevent nausea and vomiting after your treatment, we encourage you to take your nausea medication as instructed by your physician.    If you develop nausea and vomiting that is not controlled by your nausea medication, call the clinic. If it is after clinic hours your family physician or the after hours number for the clinic or go to the Emergency Department.   BELOW ARE SYMPTOMS THAT SHOULD BE REPORTED IMMEDIATELY:  *FEVER GREATER THAN 100.5 F  *CHILLS WITH OR WITHOUT FEVER  NAUSEA AND VOMITING THAT IS NOT CONTROLLED WITH YOUR NAUSEA MEDICATION  *UNUSUAL SHORTNESS OF BREATH  *UNUSUAL BRUISING OR BLEEDING  TENDERNESS IN MOUTH AND THROAT WITH OR WITHOUT PRESENCE OF ULCERS  *URINARY PROBLEMS  *BOWEL PROBLEMS  UNUSUAL RASH Items with * indicate a potential emergency and should be followed up as soon as possible.  One of the nurses will contact you 24 hours after your treatment. Please let the nurse know about any problems that you may have experienced. Feel free to call the clinic you have any questions or concerns. The clinic phone number is [phone].   I have been informed and understand all the instructions given to me. I know to contact the clinic, my physician, or go to the Emergency Department if any problems should occur. I do not have any questions at this time, but understand that I may call the clinic during office hours   should I have any questions or need assistance in obtaining follow up care.    __________________________________________  _____________  __________ Signature of Patient or Authorized Representative            Date                   Time    __________________________________________ Nurse's Signature    

## 2013-04-26 ENCOUNTER — Other Ambulatory Visit: Payer: Medicare Other | Admitting: Lab

## 2013-05-03 ENCOUNTER — Ambulatory Visit (HOSPITAL_BASED_OUTPATIENT_CLINIC_OR_DEPARTMENT_OTHER): Payer: Medicare Other

## 2013-05-03 ENCOUNTER — Ambulatory Visit (HOSPITAL_BASED_OUTPATIENT_CLINIC_OR_DEPARTMENT_OTHER): Payer: Medicare Other | Admitting: Oncology

## 2013-05-03 ENCOUNTER — Other Ambulatory Visit (HOSPITAL_BASED_OUTPATIENT_CLINIC_OR_DEPARTMENT_OTHER): Payer: Medicare Other

## 2013-05-03 VITALS — BP 147/78 | HR 66 | Temp 97.7°F | Resp 20 | Ht 71.0 in | Wt 271.5 lb

## 2013-05-03 DIAGNOSIS — E859 Amyloidosis, unspecified: Secondary | ICD-10-CM | POA: Diagnosis not present

## 2013-05-03 DIAGNOSIS — D72829 Elevated white blood cell count, unspecified: Secondary | ICD-10-CM | POA: Diagnosis not present

## 2013-05-03 DIAGNOSIS — Z5112 Encounter for antineoplastic immunotherapy: Secondary | ICD-10-CM

## 2013-05-03 DIAGNOSIS — R609 Edema, unspecified: Secondary | ICD-10-CM | POA: Diagnosis not present

## 2013-05-03 LAB — CBC WITH DIFFERENTIAL/PLATELET
BASO%: 0.5 % (ref 0.0–2.0)
EOS%: 0.7 % (ref 0.0–7.0)
HCT: 39.7 % (ref 38.4–49.9)
MCH: 32.9 pg (ref 27.2–33.4)
MCHC: 33.5 g/dL (ref 32.0–36.0)
NEUT%: 75 % (ref 39.0–75.0)
RDW: 15.2 % — ABNORMAL HIGH (ref 11.0–14.6)
lymph#: 0.9 10*3/uL (ref 0.9–3.3)

## 2013-05-03 MED ORDER — ONDANSETRON HCL 8 MG PO TABS
8.0000 mg | ORAL_TABLET | Freq: Once | ORAL | Status: AC
  Administered 2013-05-03: 8 mg via ORAL

## 2013-05-03 MED ORDER — BORTEZOMIB CHEMO SQ INJECTION 3.5 MG (2.5MG/ML)
3.1000 mg | Freq: Once | INTRAMUSCULAR | Status: AC
  Administered 2013-05-03: 3 mg via SUBCUTANEOUS
  Filled 2013-05-03: qty 3

## 2013-05-03 NOTE — Patient Instructions (Signed)
[facility] Discharge Instructions for Patients Receiving Chemotherapy  Today you received the following chemotherapy agents Velcade.  To help prevent nausea and vomiting after your treatment, we encourage you to take your nausea medication as prescribed.   If you develop nausea and vomiting that is not controlled by your nausea medication, call the clinic. If it is after clinic hours your family physician or the after hours number for the clinic or go to the Emergency Department.   BELOW ARE SYMPTOMS THAT SHOULD BE REPORTED IMMEDIATELY:  *FEVER GREATER THAN 100.5 F  *CHILLS WITH OR WITHOUT FEVER  NAUSEA AND VOMITING THAT IS NOT CONTROLLED WITH YOUR NAUSEA MEDICATION  *UNUSUAL SHORTNESS OF BREATH  *UNUSUAL BRUISING OR BLEEDING  TENDERNESS IN MOUTH AND THROAT WITH OR WITHOUT PRESENCE OF ULCERS  *URINARY PROBLEMS  *BOWEL PROBLEMS  UNUSUAL RASH Items with * indicate a potential emergency and should be followed up as soon as possible.  Feel free to call the clinic you have any questions or concerns. The clinic phone number is [phone].   I have been informed and understand all the instructions given to me. I know to contact the clinic, my physician, or go to the Emergency Department if any problems should occur. I do not have any questions at this time, but understand that I may call the clinic during office hours   should I have any questions or need assistance in obtaining follow up care.    __________________________________________  _____________  __________ Signature of Patient or Authorized Representative            Date                   Time    __________________________________________ Nurse's Signature    

## 2013-05-03 NOTE — Progress Notes (Signed)
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 Negative cytogenetics and FISH myeloma panel. Cardiac MRI showed no sign of cardiac dysfunction.   PAST TREATMENT: Velcade, dexamethasone, melphalan x 3 cycles. 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   CURRENT THERAPY: started salvage Velcade for persistent serum free light chain on 09/17/2010 (3 weeks on, 1 week off). His regimen was modified to include Revlimid 5mg  PO d1-21 every 4 weeks and Dexamathasone 10mg  PO qweek in April 2013 to due slightly elevated light chain.  He self-discontinued Revlmid in 04/2013 due to grade 1 fatigue and he thought that "it wasn't doing anything for him."   INTERVAL HISTORY: MAHIN GUARDIA III 72 y.o. male returns for regular follow up by himself. He reports feeling slightly better off of Revlimid.  However, while on Revlmid, he was still able to work full time at his own [employer] business. He denies fever, anorexia, weight loss, fatigue, headache, visual changes, confusion, drenching night sweats, palpable lymph node swelling, mucositis, odynophagia, dysphagia, nausea vomiting, jaundice, chest pain, palpitation, shortness of breath, dyspnea on exertion, productive cough, gum bleeding, epistaxis, hematemesis, hemoptysis, abdominal pain, abdominal swelling, early satiety, melena, hematochezia, hematuria, skin rash, spontaneous bleeding, joint swelling, joint pain, heat or cold intolerance, bowel bladder incontinence, back pain, focal motor weakness, paresthesia, depression.      Past Medical History  Diagnosis Date  . Hypertension   . Coronary atherosclerosis of unspecified type of vessel, native or graft   . Cerebrovascular disease,  unspecified   . Pure hypercholesterolemia   . Diverticulosis of colon (without mention of hemorrhage)   . Benign neoplasm of colon   . Unspecified hemorrhoids without mention of complication   . Degenerative disc disease   . 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 Low back pain   . Hemangioma   . Amyloidosis   . LBBB (left bundle branch block)   . 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 OSA (obstructive sleep apnea)   . Pedal edema 05/11/2012    Past Surgical History  Procedure Laterality Date  . Knee arthroscopy    . Carpal tunnel release  08/2006    Dr Thad Ranger at [facility] - Bilateral  . Bone marrow transplant  june 2011    Current Outpatient Prescriptions  Medication Sig Dispense Refill  . aspirin EC 81 MG tablet Take 1 tablet (81 mg total) by mouth daily.  90 tablet  3  . atenolol (TENORMIN) 100 MG tablet TAKE 1 TABLET (100 MG TOTAL) BY MOUTH DAILY.  90 tablet  3  . dexamethasone (DECADRON) 2 MG tablet Take 5 tablets (10 mg total) by mouth once a week.  100 tablet  3  . furosemide (LASIX) 40 MG tablet TAKE ONE TABLET BY MOUTH TWICE DAILY AS DIRECTED  180 tablet  0  . NON FORMULARY Injection for chemo As directed       No current facility-administered medications for this visit.    ALLERGIES:  has No Known Allergies.  REVIEW OF SYSTEMS:  The rest of the 14-point review of system was negative.   Filed Vitals:   05/03/13 0814  BP: 147/78  Pulse: 66  Temp: 97.7  F (36.5 C)  Resp: 20   Wt Readings from Last 3 Encounters:  05/03/13 271 lb 8 oz (123.152 kg)  03/22/13 270 lb (122.471 kg)  01/11/13 274 lb 6.4 oz (124.467 kg)   ECOG Performance status: 1  PHYSICAL EXAMINATION:   General:  Mildly obese man, in no acute distress.  Eyes:  no scleral icterus.  ENT:  There were no oropharyngeal lesions.  Neck was without thyromegaly.  Lymphatics:  Negative cervical, supraclavicular or axillary adenopathy.  Respiratory:  lungs were clear bilaterally without wheezing or crackles.  Cardiovascular:  Regular rate and rhythm, S1/S2, without murmur, rub or gallop.  There was 1+ bilateral pedal edema.  GI:  abdomen was soft, flat, nontender, nondistended, without organomegaly.  Muscoloskeletal:  no spinal tenderness of palpation of vertebral spine.  Skin exam was without echymosis, petichae.  Neuro exam was nonfocal.  Patient was able to get on and off exam table without assistance.  Gait was normal.  Patient was alerted and oriented.  Attention was good.   Language was appropriate.  Mood was normal without depression.  Speech was not pressured.  Thought content was not tangential.      LABORATORY/RADIOLOGY DATA:  Lab Results  Component Value Date   WBC 7.0 05/03/2013   HGB 13.3 05/03/2013   HCT 39.7 05/03/2013   PLT 164 05/03/2013   GLUCOSE 143* 04/05/2013   CHOL 155 01/15/2009   TRIG 77 01/15/2009   HDL 39.9 01/15/2009   LDLCALC 100* 01/15/2009   ALKPHOS 130 04/05/2013   ALT 9 04/05/2013   AST 13 04/05/2013   NA 143 04/05/2013   K 4.0 04/05/2013   CL 109* 04/05/2013   CREATININE 0.9 04/05/2013   BUN 17.2 04/05/2013   CO2 25 04/05/2013   PSA 0.81 01/15/2009   INR 1.02 09/30/2011    ASSESSMENT AND PLAN:   1. History of amyloidosis: He would like to continue only Velcace maintenance at this time even though he only had grade 1 fatigue while on Revlimid.  His disease is in remission.  I recommended to him to continue Velcade and considering adding back Revlimid in the future.  He was concerned that the absolute lambda light chain was still elevated. I informed him that it's the kappa: lambda that matters.  He would like to see if he can qualify for a clinical trial with Dr. Hurman Horn at [facility].  He will see he next month.  2. Pedal edema. Possibly secondary to venous stasis; controlled with Lasix and leg elevation.  3. Hypertension. on atenolol and Lasix per Dr. Jens Som. 4. Follow up: Velcade SQ (3wks on, 1 wk off). Today is day #1  of this current cycle.  Return visit in about 1 month.    I informed Mr. Persky that I am leaving the service. [facility] to see a new provider in August 2014.    The length of time of the face-to-face encounter was 25  minutes. More than 50% of time was spent counseling and coordination of care.

## 2013-05-04 LAB — KAPPA/LAMBDA LIGHT CHAINS: Kappa free light chain: 2.28 mg/dL — ABNORMAL HIGH (ref 0.33–1.94)

## 2013-05-08 ENCOUNTER — Telehealth: Payer: Self-pay | Admitting: *Deleted

## 2013-05-08 ENCOUNTER — Telehealth: Payer: Self-pay | Admitting: Oncology

## 2013-05-08 NOTE — Telephone Encounter (Signed)
Per staff phone call and POF I have schedueld appts.  JMW  

## 2013-05-08 NOTE — Telephone Encounter (Signed)
, °

## 2013-05-10 ENCOUNTER — Other Ambulatory Visit (HOSPITAL_BASED_OUTPATIENT_CLINIC_OR_DEPARTMENT_OTHER): Payer: Medicare Other

## 2013-05-10 ENCOUNTER — Ambulatory Visit (HOSPITAL_BASED_OUTPATIENT_CLINIC_OR_DEPARTMENT_OTHER): Payer: Medicare Other

## 2013-05-10 VITALS — BP 147/75 | HR 66 | Temp 98.0°F | Resp 20

## 2013-05-10 DIAGNOSIS — Z5112 Encounter for antineoplastic immunotherapy: Secondary | ICD-10-CM

## 2013-05-10 DIAGNOSIS — E859 Amyloidosis, unspecified: Secondary | ICD-10-CM

## 2013-05-10 LAB — CBC WITH DIFFERENTIAL/PLATELET
BASO%: 0.4 % (ref 0.0–2.0)
EOS%: 0.6 % (ref 0.0–7.0)
MCH: 32.6 pg (ref 27.2–33.4)
MCHC: 32.9 g/dL (ref 32.0–36.0)
MCV: 98.8 fL — ABNORMAL HIGH (ref 79.3–98.0)
MONO%: 6.2 % (ref 0.0–14.0)
NEUT#: 5.9 10*3/uL (ref 1.5–6.5)
RBC: 4.33 10*6/uL (ref 4.20–5.82)
RDW: 14.3 % (ref 11.0–14.6)
nRBC: 0 % (ref 0–0)

## 2013-05-10 MED ORDER — ONDANSETRON HCL 8 MG PO TABS
8.0000 mg | ORAL_TABLET | Freq: Once | ORAL | Status: AC
  Administered 2013-05-10: 8 mg via ORAL

## 2013-05-10 MED ORDER — BORTEZOMIB CHEMO SQ INJECTION 3.5 MG (2.5MG/ML)
3.1000 mg | Freq: Once | INTRAMUSCULAR | Status: AC
  Administered 2013-05-10: 3 mg via SUBCUTANEOUS
  Filled 2013-05-10: qty 3

## 2013-05-10 NOTE — Patient Instructions (Signed)
[facility] Discharge Instructions for Patients Receiving Chemotherapy  Today you received the following chemotherapy agents: velcade  To help prevent nausea and vomiting after your treatment, we encourage you to take your nausea medication.  Take it as often as prescribed.     If you develop nausea and vomiting that is not controlled by your nausea medication, call the clinic. If it is after clinic hours your family physician or the after hours number for the clinic or go to the Emergency Department.   BELOW ARE SYMPTOMS THAT SHOULD BE REPORTED IMMEDIATELY:  *FEVER GREATER THAN 100.5 F  *CHILLS WITH OR WITHOUT FEVER  NAUSEA AND VOMITING THAT IS NOT CONTROLLED WITH YOUR NAUSEA MEDICATION  *UNUSUAL SHORTNESS OF BREATH  *UNUSUAL BRUISING OR BLEEDING  TENDERNESS IN MOUTH AND THROAT WITH OR WITHOUT PRESENCE OF ULCERS  *URINARY PROBLEMS  *BOWEL PROBLEMS  UNUSUAL RASH Items with * indicate a potential emergency and should be followed up as soon as possible.  Feel free to call the clinic you have any questions or concerns. The clinic phone number is [phone].   I have been informed and understand all the instructions given to me. I know to contact the clinic, my physician, or go to the Emergency Department if any problems should occur. I do not have any questions at this time, but understand that I may call the clinic during office hours   should I have any questions or need assistance in obtaining follow up care.    __________________________________________  _____________  __________ Signature of Patient or Authorized Representative            Date                   Time    __________________________________________ Nurse's Signature    

## 2013-05-17 ENCOUNTER — Ambulatory Visit (HOSPITAL_BASED_OUTPATIENT_CLINIC_OR_DEPARTMENT_OTHER): Payer: Medicare Other

## 2013-05-17 ENCOUNTER — Other Ambulatory Visit (HOSPITAL_BASED_OUTPATIENT_CLINIC_OR_DEPARTMENT_OTHER): Payer: Medicare Other

## 2013-05-17 ENCOUNTER — Telehealth: Payer: Self-pay | Admitting: *Deleted

## 2013-05-17 VITALS — BP 168/87 | HR 71 | Temp 98.2°F | Resp 20

## 2013-05-17 DIAGNOSIS — E859 Amyloidosis, unspecified: Secondary | ICD-10-CM

## 2013-05-17 DIAGNOSIS — Z5112 Encounter for antineoplastic immunotherapy: Secondary | ICD-10-CM | POA: Diagnosis not present

## 2013-05-17 LAB — CBC WITH DIFFERENTIAL/PLATELET
BASO%: 1 % (ref 0.0–2.0)
EOS%: 0.5 % (ref 0.0–7.0)
LYMPH%: 18.8 % (ref 14.0–49.0)
MCH: 32.5 pg (ref 27.2–33.4)
MCHC: 32.9 g/dL (ref 32.0–36.0)
MONO#: 0.9 10*3/uL (ref 0.1–0.9)
RBC: 4.19 10*6/uL — ABNORMAL LOW (ref 4.20–5.82)
WBC: 8.3 10*3/uL (ref 4.0–10.3)
lymph#: 1.6 10*3/uL (ref 0.9–3.3)
nRBC: 0 % (ref 0–0)

## 2013-05-17 MED ORDER — BORTEZOMIB CHEMO SQ INJECTION 3.5 MG (2.5MG/ML)
3.1000 mg | Freq: Once | INTRAMUSCULAR | Status: AC
  Administered 2013-05-17: 3 mg via SUBCUTANEOUS
  Filled 2013-05-17: qty 3

## 2013-05-17 MED ORDER — ONDANSETRON HCL 8 MG PO TABS
8.0000 mg | ORAL_TABLET | Freq: Once | ORAL | Status: AC
  Administered 2013-05-17: 8 mg via ORAL

## 2013-05-17 NOTE — Patient Instructions (Addendum)
[facility] Discharge Instructions for Patients Receiving Chemotherapy  Today you received the following chemotherapy agents: Velcade.  To help prevent nausea and vomiting after your treatment, we encourage you to take your nausea medication as prescribed.   If you develop nausea and vomiting that is not controlled by your nausea medication, call the clinic.   BELOW ARE SYMPTOMS THAT SHOULD BE REPORTED IMMEDIATELY:  *FEVER GREATER THAN 100.5 F  *CHILLS WITH OR WITHOUT FEVER  NAUSEA AND VOMITING THAT IS NOT CONTROLLED WITH YOUR NAUSEA MEDICATION  *UNUSUAL SHORTNESS OF BREATH  *UNUSUAL BRUISING OR BLEEDING  TENDERNESS IN MOUTH AND THROAT WITH OR WITHOUT PRESENCE OF ULCERS  *URINARY PROBLEMS  *BOWEL PROBLEMS  UNUSUAL RASH Items with * indicate a potential emergency and should be followed up as soon as possible.  Feel free to call the clinic you have any questions or concerns. The clinic phone number is [phone].    

## 2013-05-17 NOTE — Telephone Encounter (Signed)
Pt called to "fix" his schedule.  There are no lab appts scheduled on some of his chemo days.  POF sent to add lab with chemo appts.. He also asked about getting some medical records to send to the [facility] clinic.  Instructed him to ask for Medical Records when he is here next time to request his records.  He verbalized understanding.

## 2013-05-18 ENCOUNTER — Telehealth: Payer: Self-pay | Admitting: *Deleted

## 2013-05-18 NOTE — Telephone Encounter (Signed)
sw pt gv lab appt d/t to go along w/ tx.pt is aware.he stated that he will be here today to get something from medical record and will stop by scheduling to get a printout...td

## 2013-05-26 DIAGNOSIS — L03119 Cellulitis of unspecified part of limb: Secondary | ICD-10-CM | POA: Diagnosis not present

## 2013-05-26 DIAGNOSIS — L02419 Cutaneous abscess of limb, unspecified: Secondary | ICD-10-CM | POA: Diagnosis not present

## 2013-05-26 DIAGNOSIS — S81009A Unspecified open wound, unspecified knee, initial encounter: Secondary | ICD-10-CM | POA: Diagnosis not present

## 2013-05-26 DIAGNOSIS — S91009A Unspecified open wound, unspecified ankle, initial encounter: Secondary | ICD-10-CM | POA: Diagnosis not present

## 2013-05-31 ENCOUNTER — Other Ambulatory Visit (HOSPITAL_BASED_OUTPATIENT_CLINIC_OR_DEPARTMENT_OTHER): Payer: Medicare Other

## 2013-05-31 ENCOUNTER — Inpatient Hospital Stay (HOSPITAL_COMMUNITY)
Admission: EM | Admit: 2013-05-31 | Discharge: 2013-06-01 | DRG: 309 | Disposition: A | Discharge status: Home / Self Care | Payer: Medicare Other | Attending: Internal Medicine | Admitting: Internal Medicine

## 2013-05-31 ENCOUNTER — Encounter: Payer: Self-pay | Admitting: Oncology

## 2013-05-31 ENCOUNTER — Encounter (HOSPITAL_COMMUNITY): Payer: Self-pay | Admitting: Emergency Medicine

## 2013-05-31 ENCOUNTER — Ambulatory Visit (HOSPITAL_BASED_OUTPATIENT_CLINIC_OR_DEPARTMENT_OTHER): Payer: Medicare Other | Admitting: Oncology

## 2013-05-31 ENCOUNTER — Emergency Department (HOSPITAL_COMMUNITY): Payer: Medicare Other

## 2013-05-31 ENCOUNTER — Other Ambulatory Visit: Payer: Self-pay

## 2013-05-31 ENCOUNTER — Ambulatory Visit: Payer: Medicare Other

## 2013-05-31 VITALS — BP 141/99 | HR 110 | Temp 97.5°F | Resp 20 | Ht 71.0 in | Wt 271.9 lb

## 2013-05-31 DIAGNOSIS — I447 Left bundle-branch block, unspecified: Secondary | ICD-10-CM | POA: Diagnosis present

## 2013-05-31 DIAGNOSIS — N179 Acute kidney failure, unspecified: Secondary | ICD-10-CM | POA: Diagnosis not present

## 2013-05-31 DIAGNOSIS — E859 Amyloidosis, unspecified: Secondary | ICD-10-CM | POA: Diagnosis not present

## 2013-05-31 DIAGNOSIS — E785 Hyperlipidemia, unspecified: Secondary | ICD-10-CM | POA: Diagnosis present

## 2013-05-31 DIAGNOSIS — I4891 Unspecified atrial fibrillation: Principal | ICD-10-CM

## 2013-05-31 DIAGNOSIS — I2510 Atherosclerotic heart disease of native coronary artery without angina pectoris: Secondary | ICD-10-CM | POA: Diagnosis present

## 2013-05-31 DIAGNOSIS — E8589 Other amyloidosis: Secondary | ICD-10-CM | POA: Diagnosis present

## 2013-05-31 DIAGNOSIS — I5023 Acute on chronic systolic (congestive) heart failure: Secondary | ICD-10-CM | POA: Diagnosis not present

## 2013-05-31 DIAGNOSIS — R0602 Shortness of breath: Secondary | ICD-10-CM | POA: Diagnosis not present

## 2013-05-31 DIAGNOSIS — E875 Hyperkalemia: Secondary | ICD-10-CM | POA: Diagnosis not present

## 2013-05-31 DIAGNOSIS — R0609 Other forms of dyspnea: Secondary | ICD-10-CM

## 2013-05-31 DIAGNOSIS — I10 Essential (primary) hypertension: Secondary | ICD-10-CM | POA: Diagnosis present

## 2013-05-31 DIAGNOSIS — G4733 Obstructive sleep apnea (adult) (pediatric): Secondary | ICD-10-CM | POA: Diagnosis present

## 2013-05-31 DIAGNOSIS — Z8673 Personal history of transient ischemic attack (TIA), and cerebral infarction without residual deficits: Secondary | ICD-10-CM | POA: Diagnosis not present

## 2013-05-31 LAB — POCT I-STAT TROPONIN I: Troponin i, poc: 0.02 ng/mL (ref 0.00–0.08)

## 2013-05-31 LAB — CBC WITH DIFFERENTIAL/PLATELET
Basophils Absolute: 0 10*3/uL (ref 0.0–0.1)
EOS%: 1 % (ref 0.0–7.0)
HGB: 14.4 g/dL (ref 13.0–17.1)
Hemoglobin: 15.1 g/dL (ref 13.0–17.0)
Lymphocytes Relative: 7 % — ABNORMAL LOW (ref 12–46)
Lymphs Abs: 0.7 10*3/uL (ref 0.7–4.0)
MCH: 32.9 pg (ref 27.2–33.4)
MONO%: 8.6 % (ref 0.0–14.0)
Monocytes Relative: 3 % (ref 3–12)
NEUT#: 5.8 10*3/uL (ref 1.5–6.5)
Neutro Abs: 9.2 10*3/uL — ABNORMAL HIGH (ref 1.7–7.7)
Neutrophils Relative %: 89 % — ABNORMAL HIGH (ref 43–77)
RBC: 4.38 10*6/uL (ref 4.20–5.82)
RBC: 4.44 MIL/uL (ref 4.22–5.81)
RDW: 15.3 % — ABNORMAL HIGH (ref 11.0–14.6)
WBC: 10.3 10*3/uL (ref 4.0–10.5)
lymph#: 0.6 10*3/uL — ABNORMAL LOW (ref 0.9–3.3)

## 2013-05-31 LAB — COMPREHENSIVE METABOLIC PANEL (CC13)
Albumin: 3 g/dL — ABNORMAL LOW (ref 3.5–5.0)
Alkaline Phosphatase: 78 U/L (ref 40–150)
BUN: 24.8 mg/dL (ref 7.0–26.0)
CO2: 28 mEq/L (ref 22–29)
Calcium: 9.2 mg/dL (ref 8.4–10.4)
Chloride: 103 mEq/L (ref 98–107)
Glucose: 118 mg/dl — ABNORMAL HIGH (ref 70–99)
Potassium: 4.5 mEq/L (ref 3.5–5.1)

## 2013-05-31 LAB — BASIC METABOLIC PANEL
BUN: 25 mg/dL — ABNORMAL HIGH (ref 6–23)
Chloride: 102 mEq/L (ref 96–112)
GFR calc Af Amer: 56 mL/min — ABNORMAL LOW (ref >90)
Potassium: 5.5 mEq/L — ABNORMAL HIGH (ref 3.5–5.1)
Sodium: 140 mEq/L (ref 135–145)

## 2013-05-31 LAB — PRO B NATRIURETIC PEPTIDE: Pro B Natriuretic peptide (BNP): 2523 pg/mL — ABNORMAL HIGH (ref 0–125)

## 2013-05-31 MED ORDER — ONDANSETRON HCL 4 MG PO TABS: 4.0000 mg | ORAL_TABLET | Freq: Four times a day (QID) | ORAL | Status: DC | PRN

## 2013-05-31 MED ORDER — RIVAROXABAN 20 MG PO TABS
20.0000 mg | ORAL_TABLET | Freq: Every day | ORAL | Status: DC
  Administered 2013-05-31: 20 mg via ORAL
  Filled 2013-05-31 (×3): qty 1

## 2013-05-31 MED ORDER — HYDROCODONE-ACETAMINOPHEN 5-325 MG PO TABS
1.0000 | ORAL_TABLET | ORAL | Status: DC | PRN
Start: 2013-05-31 — End: 2013-06-01

## 2013-05-31 MED ORDER — ATENOLOL 100 MG PO TABS
100.0000 mg | ORAL_TABLET | Freq: Every day | ORAL | Status: DC
  Administered 2013-06-01: 100 mg via ORAL
  Filled 2013-05-31: qty 1

## 2013-05-31 MED ORDER — ONDANSETRON HCL 4 MG/2ML IJ SOLN: 4.0000 mg | Freq: Four times a day (QID) | INTRAMUSCULAR | Status: DC | PRN

## 2013-05-31 MED ORDER — ASPIRIN 81 MG PO CHEW
162.0000 mg | CHEWABLE_TABLET | Freq: Once | ORAL | Status: AC
  Administered 2013-05-31: 162 mg via ORAL
  Filled 2013-05-31: qty 2

## 2013-05-31 MED ORDER — ONDANSETRON HCL 4 MG/2ML IJ SOLN: 4.0000 mg | Freq: Three times a day (TID) | INTRAMUSCULAR | Status: DC | PRN

## 2013-05-31 MED ORDER — SODIUM CHLORIDE 0.9 % IJ SOLN
3.0000 mL | Freq: Two times a day (BID) | INTRAMUSCULAR | Status: DC
  Administered 2013-05-31: 3 mL via INTRAVENOUS

## 2013-05-31 MED ORDER — DILTIAZEM HCL 30 MG PO TABS
30.0000 mg | ORAL_TABLET | Freq: Three times a day (TID) | ORAL | Status: DC
  Administered 2013-05-31 – 2013-06-01 (×3): 30 mg via ORAL
  Filled 2013-05-31 (×6): qty 1

## 2013-05-31 MED ORDER — FUROSEMIDE 40 MG PO TABS
40.0000 mg | ORAL_TABLET | Freq: Two times a day (BID) | ORAL | Status: DC
  Administered 2013-05-31 – 2013-06-01 (×2): 40 mg via ORAL
  Filled 2013-05-31 (×6): qty 1

## 2013-05-31 NOTE — Progress Notes (Signed)
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 Negative cytogenetics and FISH myeloma panel. Cardiac MRI showed no sign of cardiac dysfunction.   PAST TREATMENT: Velcade, dexamethasone, melphalan x 3 cycles. 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   CURRENT THERAPY: started salvage Velcade for persistent serum free light chain on 09/17/2010 (3 weeks on, 1 week off). His regimen was modified to include Revlimid 5mg  PO d1-21 every 4 weeks and Dexamathasone 10mg  PO qweek in April 2013 to due slightly elevated light chain.  He self-discontinued Revlmid in 04/2013 due to grade 1 fatigue and he thought that "it wasn't doing anything for him."   INTERVAL HISTORY: Johnny Mitchell 72 y.o. male returns for regular follow up by himself. The patient reports that he has had dyspnea with minimal exertion for 3-4 days. His breathing is worse when he stands up or moves at all. His breathing is back to baseline when he is sitting down. He denies chest pain and palpitations. He also reports feeling off balance and dizzy. No headaches. Denies fevers. No cough. His other big concern today is ongoing back pain. Back pain is worse with walking, however whenever he is sitting back pain goes away. He has been seen by Dr. Danielle Dess and had an MRI and a CT of his back in March 2014. There has been no clear cut evidence of the cause of his pain. 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 The patient states that he is considering a clinical trial for his amyloidosis in [place]. He has not yet have an appointment, but needs to send his records to them for review to obtain an appointment.  He denies fever, anorexia,  weight loss, fatigue, headache, visual changes, confusion, drenching night sweats, palpable lymph node swelling, mucositis, odynophagia, dysphagia, nausea vomiting, jaundice, chest pain, palpitation, shortness of breath, productive cough, gum bleeding, epistaxis, hematemesis, hemoptysis, abdominal pain, abdominal swelling, early satiety, melena, hematochezia, hematuria, skin rash, spontaneous bleeding, joint swelling, joint pain, heat or cold intolerance, bowel bladder incontinence, back pain, focal motor weakness, paresthesia, depression.    Past Medical History  Diagnosis Date  . Hypertension   . Coronary atherosclerosis of unspecified type of vessel, native or graft   . Cerebrovascular disease, unspecified   . Pure hypercholesterolemia   . Diverticulosis of colon (without mention of hemorrhage)   . Benign neoplasm of colon   . Unspecified hemorrhoids without mention of complication   . Degenerative disc disease   . 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 Low back pain   . Hemangioma   . Amyloidosis   . LBBB (left bundle branch block)   . 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 OSA (obstructive sleep apnea)   . Pedal edema 05/11/2012    Past Surgical History  Procedure Laterality Date  . Knee arthroscopy    . Carpal tunnel release  08/2006    Dr Thad Ranger at [facility] - Bilateral  . Bone marrow transplant  june 2011    Current Outpatient Prescriptions  Medication Sig Dispense Refill  . aspirin EC 81 MG tablet Take 1 tablet (81 mg total) by  mouth daily.  90 tablet  3  . atenolol (TENORMIN) 100 MG tablet Take 100 mg by mouth daily.      . bortezomib IV (VELCADE) 3.5 MG injection Inject 1.3 mg/m2 into the vein as directed. Takes every Thursday. Three weeks on, one week off.      . dexamethasone (DECADRON) 2 MG tablet Take 10 mg by mouth every Thursday. On chemo days      . furosemide (LASIX) 40 MG tablet Take 40 mg by mouth 2 (two)  times daily.       No current facility-administered medications for this visit.    ALLERGIES:  has No Known Allergies.  REVIEW OF SYSTEMS:  The rest of the 14-point review of system was negative.   Filed Vitals:   05/31/13 0859  BP: 141/99  Pulse: 110  Temp: 97.5 F (36.4 C)  Resp: 20   Wt Readings from Last 3 Encounters:  05/31/13 271 lb 14.4 oz (123.333 kg)  05/03/13 271 lb 8 oz (123.152 kg)  03/22/13 270 lb (122.471 kg)   O2 sat 95% on room air at rest.   ECOG Performance status: 1  PHYSICAL EXAMINATION:   General:  Mildly obese man who is obviously short of breath with minimal exertion.  Eyes:  no scleral icterus.  ENT:  There were no oropharyngeal lesions.  Neck was without thyromegaly.  Lymphatics:  Negative cervical, supraclavicular or axillary adenopathy.  Respiratory: lungs were clear bilaterally without wheezing or crackles.  Cardiovascular:  Heart rate irregular, S1/S2, without murmur, rub or gallop.  There was 1+ bilateral pedal edema.  GI:  abdomen was soft, flat, nontender, nondistended, without organomegaly.  Muscoloskeletal:  no spinal tenderness of palpation of vertebral spine.  Skin exam was without echymosis, petichae.  Neuro exam was nonfocal.  Patient was able to get on and off exam table without assistance.  Gait was normal.  Patient was alerted and oriented.  Attention was good.   Language was appropriate.  Mood was normal without depression.  Speech was not pressured.  Thought content was not tangential.      LABORATORY/RADIOLOGY DATA:  Lab Results  Component Value Date   WBC 7.1 05/31/2013   HGB 14.4 05/31/2013   HCT 43.0 05/31/2013   PLT 172 05/31/2013   GLUCOSE 118* 05/31/2013   CHOL 155 01/15/2009   TRIG 77 01/15/2009   HDL 39.9 01/15/2009   LDLCALC 100* 01/15/2009   ALKPHOS 78 05/31/2013   ALT 15 05/31/2013   AST 14 05/31/2013   NA 143 05/31/2013   K 4.5 05/31/2013   CL 103 05/31/2013   CREATININE 1.5* 05/31/2013   BUN 24.8 05/31/2013   CO2 28 05/31/2013    PSA 0.81 01/15/2009   INR 1.02 09/30/2011   EKG: Atrial fibrillation, left axis deviation, left bundle branch block. Ventricular rate 97  ASSESSMENT AND PLAN:   1. History of amyloidosis: He would like to continue only Velcace maintenance at this time even though he only had grade 1 fatigue while on Revlimid.  His disease is in remission.  I recommended to him to continue Velcade and considering adding back Revlimid in the future.  He would like to see if he can qualify for a clinical trial in [place].  We can assist him in sending records want to notify this as to where records need to be sent. Given his dyspnea I have held his chemotherapy today. 2. Pedal edema. Possibly secondary to venous stasis; controlled with Lasix and leg  elevation.  3. Hypertension. on atenolol and Lasix per Dr. Jens Som. 4. Dyspnea. The patient shows new atrial fibrillation on his EKG today however his heart rate is only 97. I am unclear why he is symptomatic with a heart rate of 97. We are concerned about a possible pulmonary embolism. I have advised him to go to the emergency room for further evaluation of his dyspnea. The patient is in agreement with this plan. He was escorted by our office to the emergency room and I have called the triage nurse and given her report. 5. Follow up: Velcade SQ (3wks on, 1 wk off). Chemotherapy was held today. Once this workup returns for an emergency room we will plan to bring him back next week for continuation of his Velcade.  Return visit in about 1 month.     The length of time of the face-to-face encounter was 50  minutes. More than 50% of time was spent counseling and coordination of care.

## 2013-05-31 NOTE — ED Notes (Signed)
Pt sent over from [facility] for new onset a fib and sob.  Pt states sob started a few days ago with some back pain.  Pt has had bone marrow transplant.  Pt has bilateral pitting edema on lower legs.  Sent here to rule out PE.

## 2013-05-31 NOTE — H&P (Signed)
[facility] History and Physical  Johnny Mitchell ZOX:[id number] DOB: 1941-08-24 DOA: 05/31/2013  Referring physician: ED physician PCP: Michele Mcalpine, MD   Chief Complaint: dyspnea on exertion  HPI:  Pt is 72 yo male with multiple and complex medical conditions outlined below who presented to [facility] ED with main concern of one week progressively worsening shortness of breath that initially started with exertion and has progressed to dyspnea at rest. Pt denies similar events in the past, denies any specific aggravating or alleviating factors, no fevers, chills, syncopal events, no dizziness, palpitation, visual changes. Pt also denies chest pain, orthopnea or PND.  In ED, pt noted to be in atrial fibrillation with RVR (rate in 120's), started on Cardizem PO and cardiology consult obtained by ED doctor. TR[facility] asked to admit to telemetry for further evaluation and management.   Assessment and Plan:  Principal Problem:   DYSPNEA ON EXERTION - most likely secondary to atrial fibrillation with RVR - ? Component of diastolic CHF, last EF in 10/2012 55%, pt has mild LE swelling and slightly elevated BNP on admission - monitor daily weights, I's and O's, continue Lasix as per home medical regimen, may need repeat 2 D ECHO  - pt seen by cardio team, recommendation is to keep on oral Cardizem for now, monitor on tele - if RVR uncontrolled on current regimen will consider transitioning to Cardizem drip - started on Xarelto  Active Problems:   Atrial fibrillation with RVR - continue oral Cardizem and monitor closely on telemetry  - check TSH - continue Xarelto    Acute renal failure - acute renal failure imposed on chronic renal failure  - pt on Lasix at home - monitor daily I's and O's, BMP in AM   Hyperkalemia - unclear etiology, will repeat BMP - if potassium still elevated will give one dose of Kayexalate  - BMP in AM   HYPERTENSION - reasonable BP on admission - will monitor  vitals per floor protocol   Code Status: Full Family Communication: Pt at bedside Disposition Plan: Admit to telemetry bed   Review of Systems:  Constitutional: Negative for fever, chills and malaise/fatigue. Negative for diaphoresis.  HENT: Negative for hearing loss, ear pain, nosebleeds, congestion, sore throat, neck pain, tinnitus and ear discharge.   Eyes: Negative for blurred vision, double vision, photophobia, pain, discharge and redness.  Respiratory: Negative for cough, hemoptysis, sputum production, wheezing and stridor.   Cardiovascular: Negative for chest pain, palpitations, orthopnea, claudication.  Gastrointestinal: Negative for nausea, vomiting and abdominal pain. Negative for heartburn, constipation, blood in stool and melena.  Genitourinary: Negative for dysuria, urgency, frequency, hematuria and flank pain.  Musculoskeletal: Negative for myalgias, back pain, joint pain and falls.  Skin: Negative for itching and rash.  Neurological: Negative for dizziness and weakness. Negative for tingling, tremors, sensory change, speech change, focal weakness, loss of consciousness and headaches.  Endo/Heme/Allergies: Negative for environmental allergies and polydipsia. Does not bruise/bleed easily.  Psychiatric/Behavioral: Negative for suicidal ideas. The patient is not nervous/anxious.      Past Medical History  Diagnosis Date  . Hypertension   . Coronary atherosclerosis of unspecified type of vessel, native or graft     Non obstructive  . Cerebrovascular disease, unspecified   . Pure hypercholesterolemia   . Diverticulosis of colon (without mention of hemorrhage)   . Benign neoplasm of colon   . Unspecified hemorrhoids without mention of complication   . Degenerative disc disease   . Spondylosis of unspecified site  without mention of myelopathy   . Hemangioma   . Amyloidosis   . LBBB (left bundle branch block)   . 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 OSA (obstructive sleep apnea)     Past Surgical History  Procedure Laterality Date  . Knee arthroscopy    . Carpal tunnel release  08/2006    Dr Thad Ranger at [facility] - Bilateral  . 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 Back surgery      Lumbar    Social History:  reports that he has never smoked. He does not have any smokeless tobacco history on file. He reports that  drinks alcohol. His drug history is not on file.  No Known Allergies  Family History  Problem Relation Age of Onset  . Cancer Mother     Lung  . Cancer Father     Melanoma    Prior to Admission medications   Medication Sig Start Date End Date Taking? Authorizing Provider  aspirin EC 81 MG tablet Take 1 tablet (81 mg total) by mouth daily. 10/20/12  Yes Lewayne Bunting, MD  atenolol (TENORMIN) 100 MG tablet Take 100 mg by mouth daily.   Yes Historical Provider, MD  bortezomib IV (VELCADE) 3.5 MG injection Inject 1.3 mg/m2 into the vein as directed. Takes every Thursday. Three weeks on, one week off.   Yes Historical Provider, MD  dexamethasone (DECADRON) 2 MG tablet Take 10 mg by mouth every Thursday. On chemo days   Yes Historical Provider, MD  furosemide (LASIX) 40 MG tablet Take 40 mg by mouth 2 (two) times daily.   Yes Historical Provider, MD    Physical Exam: Filed Vitals:   05/31/13 1300 05/31/13 1417 05/31/13 1421 05/31/13 1459  BP:  120/87 120/87 109/62  Pulse:   108 109  Temp:   98.4 F (36.9 C) 97.9 F (36.6 C)  TempSrc:   Oral Oral  Resp:   20 20  Height: 5' 10.87" (1.8 m)     Weight: 123 kg (271 lb 2.7 oz)     SpO2:   97% 98%    Physical Exam  Constitutional: Appears well-developed and well-nourished. No distress.  HENT: Normocephalic. External right and left ear normal. Oropharynx is clear and moist.  Eyes: Conjunctivae and EOM are normal. PERRLA, no scleral icterus.  Neck: Normal ROM. Neck supple. No JVD. No tracheal deviation. No thyromegaly.  CVS: Irregular rate and rhythm,  S1/S2 +, no gallops, no carotid bruit.  Pulmonary: Effort and breath sounds normal, no stridor, rhonchi, wheezes, rales.  Abdominal: Soft. BS +,  no distension, tenderness, rebound or guarding.  Musculoskeletal: Normal range of motion. + 1 bilateral pitting edema in LE  Lymphadenopathy: No lymphadenopathy noted, cervical, inguinal. Neuro: Alert. Normal reflexes, muscle tone coordination. No cranial nerve deficit. Skin: Skin is warm and dry. No rash noted. Not diaphoretic. No erythema. No pallor.  Psychiatric: Normal mood and affect. Behavior, judgment, thought content normal.   Labs on Admission:  Basic Metabolic Panel:  Recent Labs Lab 05/31/13 0842 05/31/13 1010  NA 143 140  K 4.5 5.5*  CL 103 102  CO2 28 28  GLUCOSE 118* 104*  BUN 24.8 25*  CREATININE 1.5* 1.40*  CALCIUM 9.2 9.3   Liver Function Tests:  Recent Labs Lab 05/31/13 0842  AST 14  ALT 15  ALKPHOS 78  BILITOT 0.65  PROT 6.1*  ALBUMIN 3.0*   CBC:  Recent Labs Lab  05/31/13 0842 05/31/13 1010  WBC 7.1 10.3  NEUTROABS 5.8 9.2*  HGB 14.4 15.1  HCT 43.0 43.3  MCV 98.2* 97.5  PLT 172 232   Radiological Exams on Admission: Dg Chest 2 View 05/31/2013  -->  No evidence of acute cardiopulmonary disease.     EKG: Normal sinus rhythm, no ST/T wave changes  Debbora Presto, MD  [facility] Pager [phone]  If 7PM-7AM, please contact night-coverage [email or web address] Password [facility] 05/31/2013, 3:47 PM

## 2013-05-31 NOTE — ED Provider Notes (Addendum)
History     CSN: [id number]  Arrival date & time 05/31/13  0945   First MD Initiated Contact with Patient 05/31/13 [phone]      Chief Complaint  Patient presents with  . Shortness of Breath  . Atrial Fibrillation  . Back Pain    (Consider location/radiation/quality/duration/timing/severity/associated sxs/prior treatment) HPI Comments: Pt with hx of amyloidosis, CAD, LBBB with SVT, CVA comes in with cc of DIB. Pt states that for the past 2-3 days, he has had increased dyspnea on exertion. Now he walks about 100 yards or so and gets short of breath, improves with resting. No associated chest pain, palpitations, near syncope/syncope. Pt denies any orthopnea, PND. Pt was seen by the Oncology routine clinic visit, and requested to come to the ER for complete evaluation.  Patient is a 72 y.o. male presenting with shortness of breath, atrial fibrillation, and back pain. The history is provided by the patient and medical records.  Shortness of Breath Associated symptoms: no chest pain, no cough, no fever, no headaches and no neck pain   Atrial Fibrillation Associated symptoms include shortness of breath. Pertinent negatives include no chest pain and no headaches.  Back Pain Associated symptoms: no chest pain, no dysuria, no fever and no headaches     Past Medical History  Diagnosis Date  . Hypertension   . Coronary atherosclerosis of unspecified type of vessel, native or graft   . Cerebrovascular disease, unspecified   . Pure hypercholesterolemia   . Diverticulosis of colon (without mention of hemorrhage)   . Benign neoplasm of colon   . Unspecified hemorrhoids without mention of complication   . Degenerative disc disease   . 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 Low back pain   . Hemangioma   . Amyloidosis   . LBBB (left bundle branch block)   . 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 OSA (obstructive sleep apnea)   .  Pedal edema 05/11/2012    Past Surgical History  Procedure Laterality Date  . Knee arthroscopy    . Carpal tunnel release  08/2006    Dr Thad Ranger at [facility] - Bilateral  . 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 Cancer Mother     Lung  . Cancer Father     Melanoma    History  Substance Use Topics  . Smoking status: Never Smoker   . Smokeless tobacco: Not on file  . Alcohol Use: Yes      Review of Systems  Constitutional: Negative for fever, chills and activity change.  HENT: Negative for neck pain.   Eyes: Negative for visual disturbance.  Respiratory: Positive for shortness of breath. Negative for cough and chest tightness.   Cardiovascular: Negative for chest pain and palpitations.  Gastrointestinal: Negative for abdominal distention.  Genitourinary: Negative for dysuria, enuresis and difficulty urinating.  Musculoskeletal: Positive for back pain. Negative for arthralgias.  Neurological: Negative for dizziness, syncope, light-headedness and headaches.  Psychiatric/Behavioral: Negative for confusion.    Allergies  Review of patient's allergies indicates no known allergies.  Home Medications   Current Outpatient Rx  Name  Route  Sig  Dispense  Refill  . aspirin EC 81 MG tablet   Oral   Take 1 tablet (81 mg total) by mouth daily.   90 tablet   3   . atenolol (TENORMIN) 100 MG tablet  Oral   Take 100 mg by mouth daily.         . bortezomib IV (VELCADE) 3.5 MG injection   Intravenous   Inject 1.3 mg/m2 into the vein as directed. Takes every Thursday. Three weeks on, one week off.         . dexamethasone (DECADRON) 2 MG tablet   Oral   Take 10 mg by mouth every Thursday. On chemo days         . furosemide (LASIX) 40 MG tablet   Oral   Take 40 mg by mouth 2 (two) times daily.           BP 135/97  Pulse 69  Temp(Src) 98.8 F (37.1 C) (Oral)  Resp 18  SpO2 98%  Physical Exam  Nursing note and  vitals reviewed. Constitutional: He is oriented to person, place, and time. He appears well-developed.  HENT:  Head: Normocephalic and atraumatic.  Eyes: Conjunctivae and EOM are normal. Pupils are equal, round, and reactive to light.  Neck: Normal range of motion. Neck supple. No JVD present.  Cardiovascular: Normal rate and regular rhythm.   Pulmonary/Chest: Effort normal and breath sounds normal. He has no wheezes. He has no rales.  Abdominal: Soft. Bowel sounds are normal. He exhibits no distension. There is no tenderness. There is no rebound and no guarding.  Musculoskeletal: He exhibits edema.  1+ pitting edema  Neurological: He is alert and oriented to person, place, and time.  Skin: Skin is warm. Rash noted.  Bilateral erythema of the leg, no warmth to touch    ED Course  Procedures (including critical care time)  Labs Reviewed  CBC WITH DIFFERENTIAL - Abnormal; Notable for the following:    Neutrophils Relative % 89 (*)    Neutro Abs 9.2 (*)    Lymphocytes Relative 7 (*)    All other components within normal limits  BASIC METABOLIC PANEL  PRO B NATRIURETIC PEPTIDE   No results found.   No diagnosis found.    MDM  Pt comes in with cc of dyspnea on exertion  Differential diagnosis includes: ACS syndrome CHF exacerbation Valvular disorder Pericardial effusion Pneumonia Pleural effusion Pulmonary edema PE Anemia Musculoskeletal pain   Date: 05/31/2013  Rate: 117  Rhythm: atrial fibrillation  QRS Axis: right  Intervals: normal  ST/T Wave abnormalities: nonspecific ST/T changes  Conduction Disutrbances:right bundle branch block  Narrative Interpretation:   Old EKG Reviewed: changes noted - NEW AFIB  I reviewed the workup patient has had done so far. Dr. Jens Som, Cardiologist, last note: "h/o SVT, Amyloid (s/p transplant), nonobstructive CAD for f/u. Cardiac catheterization in November of 2010 revealed normal filling pressures and a 70-80 % OM which is  unchanged compared to previous."  Thus patient certainly has CAD - and the dyspnea could be angina equivalent. Although it appears that amyloidosis is under control - this could be cardiac or pulm manifestation of worsening of the dz. Last echo in 2013 showed 55-60% EF.  Pt also noted to be in afib with RVR - HR in the 120s. Hemodynamically stable. CHAD2 score is 4 - so will need anticoagulation. Appears that he has had SVT before. LBBB is not new.  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 Will check Cr. PE will be in the ddx - but i think it will be safer to systematically evaluate  the patient at this time, rather than get a ct pe right away.  Will need admission and further diagnostic workup.   Derwood Kaplan, MD 05/31/13 1102  Derwood Kaplan, MD 05/31/13 1105

## 2013-05-31 NOTE — Consult Note (Signed)
CARDIOLOGY CONSULT NOTE  Patient ID: Johnny Mitchell MRN: [id number] DOB/AGE: 12/23/1940 72 y.o.  Admit date: 05/31/2013 Primary Physician Michele Mcalpine, MD Primary Cardiologist [name] Chief Complaint  Atrial fibrillation  HPI:  The patient has a history of SVT and nonobstructive CAD.  Previous MRI demonstrated a normal EF an could not exclude cardiac amyloid.  EF on echo 2013 was 55%.   He has also been treated for wide complex tachycardia felt most likely SVT with aberrancy. Treated transiently with amiodarone and then  beta blockade.   The patient had been in his usual state of health. He came today for his Velcade injection.  He was noted to have a rapid pulse.  In retrospect he has had increasing shortness of breath or probably greater than a couple of days. He is not is any palpitations, presyncope or syncope. He does not have any chest pressure, neck or arm discomfort. He's had no PND or orthopnea. He has chronic mild lower extremity edema. He is a little bit limited by back pain but wouldn't be able to walk 100 yards without shortness of breath prior to this but not would probably not make it more than 50. In the emergency room he was noted to have atrial fibrillation with a rapid ventricular response.    Past Medical History  Diagnosis Date  . Hypertension   . Coronary atherosclerosis of unspecified type of vessel, native or graft   . Cerebrovascular disease, unspecified   . Pure hypercholesterolemia   . Diverticulosis of colon (without mention of hemorrhage)   . Benign neoplasm of colon   . Unspecified hemorrhoids without mention of complication   . Degenerative disc disease   . 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 Low back pain   . Hemangioma   . Amyloidosis   . LBBB (left bundle branch block)   . 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 OSA (obstructive sleep apnea)   . Pedal edema 05/11/2012      Past Surgical History  Procedure Laterality Date  . Knee arthroscopy    . Carpal tunnel release  08/2006    Dr Thad Ranger at [facility] - Bilateral  . 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 Authorizing Provider  aspirin EC 81 MG tablet Take 1 tablet (81 mg total) by mouth daily. 10/20/12  Yes Lewayne Bunting, MD  atenolol (TENORMIN) 100 MG tablet Take 100 mg by mouth daily.   Yes Historical Provider, MD  bortezomib IV (VELCADE) 3.5 MG injection Inject 1.3 mg/m2 into the vein as directed. Takes every Thursday. Three weeks on, one week off.   Yes Historical Provider, MD  dexamethasone (DECADRON) 2 MG tablet Take 10 mg by mouth every Thursday. On chemo days   Yes Historical Provider, MD  furosemide (LASIX) 40 MG tablet Take 40 mg by mouth daily    Yes Historical Provider, MD     (Not in a hospital admission) Family History  Problem Relation Age of Onset  . Cancer Mother     Lung  . Cancer Father     Melanoma    History   Social History  . Marital Status: Married    Spouse Name: N/A    Number of Children: 2  . Years of Education: N/A   Occupational History  . PRESIDENT  [employer]   Social History Main Topics  . Smoking status: Never Smoker   . Smokeless tobacco: Not on file  . Alcohol Use: Yes  . Drug Use: Not on file  . Sexually Active: Not on file   Other Topics Concern  . Not on file   Social History Narrative  . No narrative on file     ROS:  Back pain.  As stated in the HPI and negative for all other systems.  Physical Exam: Blood pressure 115/89, pulse 122, temperature 98.8 F (37.1 C), temperature source Oral, resp. rate 18, SpO2 96.00%.  GENERAL:  Well appearing HEENT:  Pupils equal round and reactive, fundi not visualized, oral mucosa unremarkable NECK:  No jugular venous distention, waveform within normal limits, carotid upstroke brisk and symmetric,  no bruits, no thyromegaly LYMPHATICS:  No cervical, inguinal adenopathy LUNGS:  Clear to auscultation bilaterally BACK:  No CVA tenderness CHEST:  Unremarkable HEART:  PMI not displaced or sustained,S1 and S2 within normal limits, no S3, no no clicks, no rubs, no murmurs, irregular  ABD:  Flat, positive bowel sounds normal in frequency in pitch, no bruits, no rebound, no guarding, no midline pulsatile mass, no hepatomegaly, no splenomegaly EXT:  2 plus pulses throughout,  mild bilateral lower extremity edema, no cyanosis no clubbing, bandaged wound on the left lower leg, mild chronic venous stasis changes  SKIN:  No rashes no nodules NEURO:  Cranial nerves II through XII grossly intact, motor grossly intact throughout PSYCH:  Cognitively intact, oriented to person place and time   Labs: Lab Results  Component Value Date   BUN 25* 05/31/2013   Lab Results  Component Value Date   CREATININE 1.40* 05/31/2013   Lab Results  Component Value Date   NA 140 05/31/2013   K 5.5* 05/31/2013   CL 102 05/31/2013   CO2 28 05/31/2013    Lab Results  Component Value Date   WBC 10.3 05/31/2013   HGB 15.1 05/31/2013   HCT 43.3 05/31/2013   MCV 97.5 05/31/2013   PLT 232 05/31/2013    Lab Results  Component Value Date   ALT 15 05/31/2013   AST 14 05/31/2013   ALKPHOS 78 05/31/2013   BILITOT 0.65 05/31/2013    Radiology:    CXR:  No evidence of acute cardiopulmonary disease.  EKG:  Atrial fibrillation, rate 117, LBBB, LAD.  Ltd  ASSESSMENT AND PLAN:   Atrial fibrillation:  Observe for rate control overnight.  We can initially try PO Cardizem but switch to IV if rate remains elevated.  Discussed anticoagulation and risk benefits.  [name] has a CHA2DS2 - VASc score of 2 with a risk of stroke of 2.2%  and a HAS - BLED score of 2 with a low risk of bleeding.  Anticoagulation is indicated.  I will start Xarelto.  Of note his creatinine clearance is just below 50. He should get a basic  metabolic profile repeated tomorrow and if this remains below this threshold the discharge dose with the 15 mg.  Ultimately he might need DCCV and if recurrent afib amiodarone.  Cardiomyopathy:  He can have echo as an outpatient.  I would check a BNP level this admission.  EF previously has been mildly reduced.  For now continue current amiodarone.    SignedRollene Rotunda 05/31/2013, 12:25 PM

## 2013-06-01 ENCOUNTER — Telehealth: Payer: Self-pay | Admitting: Cardiology

## 2013-06-01 DIAGNOSIS — I5032 Chronic diastolic (congestive) heart failure: Secondary | ICD-10-CM | POA: Insufficient documentation

## 2013-06-01 DIAGNOSIS — E859 Amyloidosis, unspecified: Secondary | ICD-10-CM | POA: Diagnosis not present

## 2013-06-01 DIAGNOSIS — N179 Acute kidney failure, unspecified: Secondary | ICD-10-CM | POA: Diagnosis not present

## 2013-06-01 DIAGNOSIS — I4891 Unspecified atrial fibrillation: Secondary | ICD-10-CM | POA: Diagnosis not present

## 2013-06-01 DIAGNOSIS — E875 Hyperkalemia: Secondary | ICD-10-CM

## 2013-06-01 LAB — BASIC METABOLIC PANEL
BUN: 30 mg/dL — ABNORMAL HIGH (ref 6–23)
Calcium: 9.1 mg/dL (ref 8.4–10.5)
Creatinine, Ser: 1.47 mg/dL — ABNORMAL HIGH (ref 0.50–1.35)
GFR calc non Af Amer: 45 mL/min — ABNORMAL LOW (ref >90)
Glucose, Bld: 126 mg/dL — ABNORMAL HIGH (ref 70–99)
Sodium: 138 mEq/L (ref 135–145)

## 2013-06-01 LAB — CBC
MCH: 31.3 pg (ref 26.0–34.0)
MCHC: 32.2 g/dL (ref 30.0–36.0)
MCV: 97.2 fL (ref 78.0–100.0)
Platelets: 183 10*3/uL (ref 150–400)
RDW: 14.1 % (ref 11.5–15.5)

## 2013-06-01 LAB — KAPPA/LAMBDA LIGHT CHAINS
Kappa free light chain: 2.46 mg/dL — ABNORMAL HIGH (ref 0.33–1.94)
Kappa:Lambda Ratio: 1.6 (ref 0.26–1.65)
Lambda Free Lght Chn: 1.54 mg/dL (ref 0.57–2.63)

## 2013-06-01 LAB — GLUCOSE, CAPILLARY: Glucose-Capillary: 133 mg/dL — ABNORMAL HIGH (ref 70–99)

## 2013-06-01 MED ORDER — DILTIAZEM HCL ER COATED BEADS 120 MG PO CP24
120.0000 mg | ORAL_CAPSULE | Freq: Every day | ORAL | Status: DC
  Administered 2013-06-01: 120 mg via ORAL
  Filled 2013-06-01: qty 1

## 2013-06-01 MED ORDER — RIVAROXABAN 15 MG PO TABS: 15.0000 mg | ORAL_TABLET | Freq: Every day | ORAL | Status: DC

## 2013-06-01 MED ORDER — RIVAROXABAN 15 MG PO TABS: 15.0000 mg | ORAL_TABLET | Freq: Two times a day (BID) | ORAL | Status: DC

## 2013-06-01 MED ORDER — RIVAROXABAN 20 MG PO TABS: 20.0000 mg | ORAL_TABLET | Freq: Every day | ORAL | Status: DC

## 2013-06-01 MED ORDER — FUROSEMIDE 40 MG PO TABS: 40.0000 mg | ORAL_TABLET | Freq: Two times a day (BID) | ORAL | Status: DC

## 2013-06-01 MED ORDER — DILTIAZEM HCL ER COATED BEADS 120 MG PO CP24: 120.0000 mg | ORAL_CAPSULE | Freq: Every day | ORAL | Status: DC

## 2013-06-01 MED ORDER — ASPIRIN 81 MG PO TABS: 81.0000 mg | ORAL_TABLET | Freq: Every day | ORAL | Status: DC

## 2013-06-01 NOTE — Telephone Encounter (Signed)
Left message for pt to call.

## 2013-06-01 NOTE — Progress Notes (Signed)
PT Cancellation Note / Screen  Patient Details Name: Johnny Mitchell MRN: [id number] DOB: September 14, 1941   Cancelled Treatment:    Reason Eval/Treat Not Completed: Other (comment) Pt reports he does not need physical therapy at this time and to d/c home today.   Eddie Koc,KATHrine E 06/01/2013, 9:01 AM Zenovia Jarred, PT, DPT 06/01/2013 Pager: [phone]

## 2013-06-01 NOTE — Telephone Encounter (Signed)
New Prob     Pt would like to speak to nurse regarding upcoming appt. Please call.

## 2013-06-01 NOTE — Progress Notes (Signed)
Patient discharged from hospital today.  Attempted to contact patient several times today; left message to call office if he has any questions or concerns.

## 2013-06-01 NOTE — Progress Notes (Signed)
Subjective:  Patient feels well and is anxious to go home. Rate has improved on oral cardizem.  Objective:  Vital Signs in the last 24 hours: Temp:  [97.5 F (36.4 C)-98.8 F (37.1 C)] 98 F (36.7 C) (06/20 0516) Pulse Rate:  [69-125] 109 (06/20 0516) Resp:  [18-26] 18 (06/20 0516) BP: (108-141)/(62-99) 108/65 mmHg (06/20 0516) SpO2:  [96 %-100 %] 98 % (06/20 0516) Weight:  [271 lb 2.7 oz (123 kg)-271 lb 14.4 oz (123.333 kg)] 271 lb 2.7 oz (123 kg) (06/19 1300)  Intake/Output from previous day: 06/19 0701 - 06/20 0700 In: 480 [P.O.:480] Out: 400 [Urine:400] Intake/Output from this shift:    . atenolol  100 mg Oral Daily  . diltiazem  30 mg Oral Q8H  . furosemide  40 mg Oral BID  . rivaroxaban  20 mg Oral Q supper  . sodium chloride  3 mL Intravenous Q12H      Physical Exam: The patient appears to be in no distress.  Head and neck exam reveals that the pupils are equal and reactive.  The extraocular movements are full.  There is no scleral icterus.  Mouth and pharynx are benign.  No lymphadenopathy.  No carotid bruits.  The jugular venous pressure is normal.  Thyroid is not enlarged or tender.  Chest is clear to percussion and auscultation.  No rales or rhonchi.  Expansion of the chest is symmetrical.  Heart reveals no abnormal lift or heave.  First and second heart sounds are normal.  There is no murmur gallop rub or click.  The abdomen is soft and nontender.  Bowel sounds are normoactive.  There is no hepatosplenomegaly or mass.  There are no abdominal bruits.  Extremities reveal no phlebitis or edema.  Pedal pulses are good.  There is no cyanosis or clubbing.  Neurologic exam is normal strength and no lateralizing weakness.  No sensory deficits.  Integument reveals no rash  Lab Results:  Recent Labs  05/31/13 1010 06/01/13 0548  WBC 10.3 8.2  HGB 15.1 13.6  PLT 232 183    Recent Labs  05/31/13 1010 06/01/13 0548  NA 140 138  K 5.5* 4.1  CL 102  100  CO2 28 30  GLUCOSE 104* 126*  BUN 25* 30*  CREATININE 1.40* 1.47*   No results found for this basename: TROPONINI, CK, MB,  in the last 72 hours Hepatic Function Panel  Recent Labs  05/31/13 0842  PROT 6.1*  ALBUMIN 3.0*  AST 14  ALT 15  ALKPHOS 78  BILITOT 0.65   No results found for this basename: CHOL,  in the last 72 hours No results found for this basename: PROTIME,  in the last 72 hours  Imaging: Dg Chest 2 View  05/31/2013   *RADIOLOGY REPORT*  Clinical Data: Shortness of breath, sweats  CHEST - 2 VIEW  Comparison: 08/12/2011  Findings: Chronic interstitial markings.  Lingular scarring. Bibasilar scarring versus atelectasis.  No pleural effusion or pneumothorax.  Cardiomegaly.  Degenerative changes of the visualized thoracolumbar spine.  IMPRESSION: No evidence of acute cardiopulmonary disease.   Original Report Authenticated By: Charline Bills, M.D.    Cardiac Studies:  Assessment/Plan:  1. Cardiac amyloidosis. 2. LBBB 3. New atrial fibrillation 4. Mild renal insufficiency  Rec:  Will switch to long-acting cardizem           Would have pharmacy check re dose of xarelto for him. He does have some renal insufficiency. Okay for discharge today on BB and cardizem  and xarelto. Followup with Dr. Jens Som in office next week.   LOS: 1 day    Johnny Mitchell 06/01/2013, 7:40 AM

## 2013-06-01 NOTE — Telephone Encounter (Signed)
Spoke with pt, post hosp appt made for Monday per pt request to make sure meds are okay.

## 2013-06-01 NOTE — Discharge Summary (Addendum)
Physician Discharge Summary  Johnny Mitchell ZOX:[id number] DOB: Jul 16, 1941 DOA: 05/31/2013  PCP: Michele Mcalpine, MD  Admit date: 05/31/2013 Discharge date: 06/01/2013  Time spent: 40 minutes  Recommendations for Outpatient Follow-up:  Home with outpatient PCP and  cardiology follow up   Discharge Diagnoses:   Principal Problem:   Atrial fibrillation with RVR , new onset  Active Problems:   Hypertension   CAD   Dyspnea   Acute renal failure   Hyperkalemia   Discharge Condition: fair  Diet recommendation: cardiac  Filed Weights   05/31/13 1300  Weight: 123 kg (271 lb 2.7 oz)    History of present illness:  Please refer to admission H&P for details but in brief,72 yo male with history of nonobstructive CAD, hypertension, history of CVA, hyperlipidemia, SVTs, LBBB . 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 Pt denies similar events in the past, denies any specific aggravating or alleviating factors, no fevers, chills, syncopal events, no dizziness, palpitation, visual changes. Pt also denied chest pain, orthopnea or PND.  In ED, patient was noted to be in atrial fibrillation with RVR (rate in 120's), started on Cardizem PO and cardiology consult obtained. Patient admitted to medical floor on telemetry.     Hospital Course:  A. fib with RVR, new onset Patient monitored on telemetry. Admission will BNP was 2500 however clinically did not have any signs of CHF. Last 2-D echo 7 months back had a normal EF. Patient placed on oral Cardizem. His home dose of atenolol was continued. He said it was well controlled on these medications. Aspirin was continued. Given his chads 2 score of 2 cardiology recommended starting him on anticoagulation with Xarelto. - patient is a  stable on telemetry with his rate controlled.cardiology recommend switching him to long-acting Cardizem 120 mg daily  along with beta blocker. -He is clinically stable and asymptomatic. He is symptoms of progressive dyspnea was likely related to A. Fib and has now resolved. -He has an appointment with his cardiologist Dr. Jens Som next week and we'll followup with him. -Patient instructed to call his cardiologist or to return to the ED if he has similar symptoms.  Nonobstructive CAD Continue aspirin.   Amyloidosis  follows with Dr Gaylyn Rong and gets scheduled velcade. No signs of cardiac involvement on previous cardiac MRI.   AKI Last creatinine 2 months back was normal. 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 Will reduce lasix dose to 60 mg daily and follow as outpt.  Hyperkalemia Present on admission and resolved on subsequent lab.   Patient is clinically stable and can be discharged home with outpatient followup with his PCP and cardiology. He will  be discharged on Cardizem for his rate control along with home dose atenolol and xarelto for anticoagulation.  Procedures:  none  Consultations:   [facility]   Discharge Exam: Filed Vitals:   05/31/13 1421 05/31/13 1459 05/31/13 2125 06/01/13 0516  BP: 120/87 109/62 115/73 108/65  Pulse: 108 109 80 109  Temp: 98.4 F (36.9 C) 97.9 F (36.6 C) 98 F (36.7 C) 98 F (36.7 C)  TempSrc: Oral Oral Oral Oral  Resp: 20 20 18 18   Height:      Weight:      SpO2: 97% 98% 97% 98%  General: Elderly obese male in no acute distress HEENT: No pallor, moist oral mucosa Chest: Clear to auscultation bilaterally, no added sounds CVS: Normal S1 and S2, no murmurs rub or gallop Abdomen: Soft, nontender, nondistended, bowel sounds present Extremities: Warm, no edema CNS: AAO x3   Discharge Instructions   Future Appointments Provider Department Dept Phone   06/07/2013 8:00 AM Windell [place]  MEDICAL ONCOLOGY [phone]   06/07/2013 8:15 AM [facility] [place] [facility] MED[facility] [phone]   06/14/2013 8:15 AM Beverely Pace [facility] MEDICAL ONCOLOGY [phone]   06/14/2013 8:30 AM [facility] [place] [facility] MEDICAL ONCOL[facility] [phone]   06/28/2013 9:00 AM Mauri [place] [facility] MEDICAL ONCOLOGY [phone]   06/28/2013 9:30 AM Exie Parody, MD [facility] MED[facility] ONCOL[facility] [phone]   06/28/2013 10:30 AM [facility] [place] [facility] MEDICAL ONCOL[facility] [phone]   07/05/2013 8:00 AM Mauri [place] [facility] MEDICAL ONCOLOGY [phone]   07/05/2013 8:30 AM [facility] [place] [facility] MEDICAL ONCOLOGY [phone]   07/12/2013 8:00 AM Mauri [place] [facility] MEDICAL ONCOLOGY [phone]   07/12/2013 8:30 AM [facility] [place] [facility] MEDICAL ONCOL[facility] [phone]   07/26/2013 8:00 AM Mauri [place] [facility] MEDICAL ONCOLOGY [phone]   07/26/2013 8:30 AM [facility] [place] [facility] MEDICAL ONCOL[facility] [phone]   08/02/2013 8:00 AM Windell [facility] MEDICAL ONCOLOGY [phone]   08/02/2013 8:30 AM [facility] [place] [facility] MEDICAL ONCOL[facility] [phone]   08/09/2013 8:00 AM Delcie Roch [place] [facility] MEDICAL ONCOLOGY [phone]   08/09/2013 8:30 AM [facility] A[facility]  [facility] MEDICAL ONCOLOGY [phone]       Medication List    STOP taking these medications             TAKE these medications       aspirin 81 MG tablet  Take 1 tablet (81 mg total) by mouth daily.     atenolol 100 MG tablet  Commonly known as:  TENORMIN  Take 100 mg by mouth daily.     dexamethasone 2 MG tablet  Commonly known as:  DECADRON  Take 10 mg by mouth every Thursday. On chemo days     diltiazem 120 MG 24 hr capsule  Commonly  known as:  CARDIZEM CD  Take 1 capsule (120 mg total) by mouth daily.     furosemide 40 MG tablet  Commonly known as:  LASIX  T Take 1 tab in am ( 40 mg) and 1/2 tab in pm ( 20 mg ) . Total 60 mg daily     Rivaroxaban 20 MG Tabs  Commonly known as:  XARELTO  Take 1 tablet (20 mg total) by mouth daily with supper.start taking on 06/22/2013     Rivaroxaban 15 MG Tabs tablet  Commonly known as:  XARELTO  Take 1 tablet (15 mg total) by mouth bid  Until 06/21/13         VELCADE 3.5 MG injection  Generic drug:  bortezomib IV  Inject 1.3 mg/m2 into the vein as directed. Takes every Thursday. Three weeks on, one week off.       No Known Allergies     Follow-up Information   Follow up with NADEL,SCOTT M, MD In 1 week.   Contact information:   [address] [place]  [address] [phone]       Follow up with [name], MD In 1 week. (has appt on 6/24)    Contact information:   [address] [place], [address]                         [address] [place] [address] [phone]        The results of significant diagnostics from this hospitalization (including imaging, microbiology, ancillary and laboratory) are listed below for reference.    Significant Diagnostic Studies: Dg Chest 2 View  05/31/2013   *RADIOLOGY REPORT*  Clinical Data: Shortness of breath, sweats  CHEST - 2 VIEW  Comparison: 08/12/2011  Findings: Chronic interstitial markings.  Lingular scarring. Bibasilar scarring versus atelectasis.  No pleural effusion or pneumothorax.  Cardiomegaly.  Degenerative changes of the visualized thoracolumbar spine.  IMPRESSION: No evidence of acute cardiopulmonary disease.   Original Report Authenticated By: Charline Bills, M.D.    Microbiology: No results found for this or any previous visit (from the past 240 hour(s)).   Labs: Basic Metabolic Panel:  Recent Labs Lab 05/31/13 0842 05/31/13 1010 06/01/13 0548  NA 143 140 138  K 4.5 5.5* 4.1  CL 103 102 100  CO2 28 28 30    GLUCOSE 118* 104* 126*  BUN 24.8 25* 30*  CREATININE 1.5* 1.40* 1.47*  CALCIUM 9.2 9.3 9.1   Liver Function Tests:  Recent Labs Lab 05/31/13 0842  AST 14  ALT 15  ALKPHOS 78  BILITOT 0.65  PROT 6.1*  ALBUMIN 3.0*   No results found for this basename: LIPASE, AMYLASE,  in the last 168 hours No results found for this basename: AMMONIA,  in the last 168 hours CBC:  Recent Labs Lab 05/31/13 0842 05/31/13 1010 06/01/13 0548  WBC 7.1 10.3 8.2  NEUTROABS 5.8 9.2*  --   HGB 14.4 15.1 13.6  HCT 43.0 43.3 42.2  MCV 98.2* 97.5 97.2  PLT 172 232 183   Cardiac Enzymes: No results found for this basename: CKTOTAL, CKMB, CKMBINDEX, TROPONINI,  in the last 168 hours BNP: BNP (last 3 results)  Recent Labs  05/31/13 1010  PROBNP 2523.0*   CBG:  Recent Labs Lab 06/01/13 0747  GLUCAP 133*       Signed:  Omer Monter  [facility] 06/01/2013, 10:06 AM

## 2013-06-04 ENCOUNTER — Ambulatory Visit (INDEPENDENT_AMBULATORY_CARE_PROVIDER_SITE_OTHER): Payer: Medicare Other | Admitting: Physician Assistant

## 2013-06-04 ENCOUNTER — Encounter: Payer: Self-pay | Admitting: Physician Assistant

## 2013-06-04 VITALS — BP 118/70 | HR 85 | Ht 71.0 in | Wt 265.2 lb

## 2013-06-04 DIAGNOSIS — E859 Amyloidosis, unspecified: Secondary | ICD-10-CM

## 2013-06-04 DIAGNOSIS — I4891 Unspecified atrial fibrillation: Secondary | ICD-10-CM

## 2013-06-04 DIAGNOSIS — I2510 Atherosclerotic heart disease of native coronary artery without angina pectoris: Secondary | ICD-10-CM

## 2013-06-04 DIAGNOSIS — N189 Chronic kidney disease, unspecified: Secondary | ICD-10-CM

## 2013-06-04 DIAGNOSIS — I10 Essential (primary) hypertension: Secondary | ICD-10-CM

## 2013-06-04 MED ORDER — RIVAROXABAN 20 MG PO TABS: 20.0000 mg | ORAL_TABLET | Freq: Every day | ORAL | Status: DC

## 2013-06-04 NOTE — Progress Notes (Signed)
[address] [place], [place]  [address] Phone: [phone] Fax:  [phone]  Date:  06/04/2013   ID:  MILT COYE, DOB [birth year], MRN [id number]  PCP:  Michele Mcalpine, MD  Cardiologist:  Dr. [name]     History of Present Illness: Johnny Mitchell is a 72 y.o. male who returns for follow up after recent admission to the hospital with atrial fibrillation with RVR.  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  LHC 10/2009:  pLAD 205, dLAD 30%, mOM2 70-80% (unchanged compared to previous), pRCA 20%, dRCA 20%. Cardiac MRI in January of 2011 showed an ejection fraction of 57%, mild biatrial enlargement, mild right ventricular enlargement. Cardiac amyloid could not be definitively excluded.  Patient was admitted in February of 2012 with a wide complex tachycardia. Seen by EP and felt most likely SVT with aberrancy. Treated transiently with amiodarone but then placed on beta blockade. It was felt that if symptoms recur ablation would be warranted.  Last seen by Dr. [name] 10/2012.  Echo 10/2012: Abnormal septal motion from ?bundle branch block, moderate LVH, EF 55-60%, mild MR, moderate LAE mild RAE, PASP 53.  He was admitted 6/19-6/20. He was noted to have a rapid pulse while receiving his Velcade injection at the [facility]. He was sent to the emergency room where an ECG demonstrated atrial fibrillation with RVR. He was placed on Xarelto for stroke prophylaxis.  He was placed on diltiazem for rate control.  Seen by cardiology.  There was a question of whether he should be on Xarelto 15 or Xarelto 20 b/c of his renal fxn.  Somehow, he was d/c on Xarelto 15 mg bid x 3 weeks, then Xarelto 20 mg QD.  He has no hx of DVT.  He notes DOE.  This is worse over the last 1 month.  No orthopnea, PND.  LE edema unchanged.  No chest pain.  No palpitations.  No syncope.    Labs (6/14):  K 4.1, Cr 1.47, ALT 14,  Hgb 13.6, TSH 0.432    Wt Readings from Last 3 Encounters:  05/31/13 271 lb 2.7 oz (123 kg)  05/31/13 271 lb 14.4 oz (123.333 kg)  05/03/13 271 lb 8 oz (123.152 kg)     Past Medical History  Diagnosis Date  . Hypertension   . Coronary atherosclerosis of unspecified type of vessel, native or graft     Non obstructive  . Cerebrovascular disease, unspecified   . Pure hypercholesterolemia   . Diverticulosis of colon (without mention of hemorrhage)   . Benign neoplasm of colon   . Unspecified hemorrhoids without mention of complication   . Degenerative disc disease   . 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 Hemangioma   . Amyloidosis   . LBBB (left bundle branch block)   . 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 OSA (obstructive sleep apnea)     Current Outpatient Prescriptions  Medication Sig Dispense Refill  . aspirin 81 MG tablet Take 1 tablet (81 mg total) by mouth daily.  30 tablet  0  . atenolol (TENORMIN) 100 MG tablet Take 100 mg by mouth daily.      . bortezomib IV (VELCADE) 3.5 MG injection Inject 1.3 mg/m2 into the vein as directed. Takes every Thursday. Three weeks on, one week off.      . dexamethasone (  DECADRON) 2 MG tablet Take 10 mg by mouth every Thursday. On chemo days      . diltiazem (CARDIZEM CD) 120 MG 24 hr capsule Take 1 capsule (120 mg total) by mouth daily.  30 capsule  0  . furosemide (LASIX) 40 MG tablet Take 1 tablet (40 mg total) by mouth 2 (two) times daily. Take 1 tab in am ( 40 mg) and 1/2 tab in pm ( 20 mg ) . Total 60 mg daily  30 tablet  0  . Rivaroxaban (XARELTO) 15 MG TABS tablet Take 1 tablet (15 mg total) by mouth 2 (two) times daily.  42 tablet  0  . [START ON 06/22/2013] Rivaroxaban (XARELTO) 20 MG TABS Take 1 tablet (20 mg total) by mouth daily with supper.  30 tablet  2   No current facility-administered medications for this visit.    Allergies:   No Known Allergies  Social  History:  The patient  reports that he has never smoked. He has never used smokeless tobacco. He reports that  drinks alcohol. He reports that he does not use illicit drugs.   ROS:  Please see the history of present illness.      All other systems reviewed and negative.   PHYSICAL EXAM: VS:  BP 118/70  Pulse 85  Ht 5\' 11"  (1.803 m)  Wt 265 lb 3.2 oz (120.294 kg)  BMI 37 kg/m2 Well nourished, well developed, in no acute distress HEENT: normal Neck: no JVD Cardiac:  normal S1, S2; irreg irreg rhythm; no murmur Lungs:  clear to auscultation bilaterally, no wheezing, rhonchi or rales Abd: soft, nontender, no hepatomegaly Ext: trace to 1+ bilat LE edema Skin: warm and dry Neuro:  CNs 2-12 intact, no focal abnormalities noted  EKG:  Atrial fibrillation, HR 85, LBBB     ASSESSMENT AND PLAN:  1. Atrial Fibrillation:  Patient's dose of Xarelto should be 20 mg daily (creatinine clearance 75 mL/minute). I have asked him to stop 15 mg twice a day.  Rate is well controlled on current therapy. He will continue this.  He is not that symptomatic. However, I do believe that his dyspnea is partly explained by his atrial fibrillation.  He will be set up for follow up in the next 3-4 weeks. If he remains in atrial fibrillation at that time, we will arrange cardioversion.  I will review with Dr. Jens Som whether or not we should consider antiarrhythmic drug therapy. 2. Amyloidosis: Continue follow up with oncology. Recent echo with normal LV function. 3. CKD: Lasix recently reduced. 4. Hypertension: Controlled. 5. Hyperlipidemia:  It appears that he is off of his statin for unclear reasons. 6. CAD:  No angina.  He remains on aspirin. We'll need to review his medications when he returns to determine if he is indeed continuing on statin therapy. 7. Disposition: Follow up with me or Dr. Jens Som in 3 weeks.  Signed, Tereso Newcomer, PA-C  06/04/2013 12:24 PM

## 2013-06-04 NOTE — Patient Instructions (Addendum)
PLEASE FOLLOW UP WITH DR. CRENSHAW IN THE NEXT 3-4 WEEKS; IF NOT AVAILABLE THEN WITH SCOTT WEAVER, PAC SAME DAY DR. Jens Som IS IN THE OFFICE  AN RX FOR XARELTO 20 MG HAS BEEN SENT IN TODAY

## 2013-06-07 ENCOUNTER — Other Ambulatory Visit: Payer: Self-pay | Admitting: Oncology

## 2013-06-07 ENCOUNTER — Other Ambulatory Visit (HOSPITAL_BASED_OUTPATIENT_CLINIC_OR_DEPARTMENT_OTHER): Payer: BLUE CROSS/BLUE SHIELD

## 2013-06-07 ENCOUNTER — Telehealth: Payer: Self-pay

## 2013-06-07 ENCOUNTER — Ambulatory Visit (HOSPITAL_BASED_OUTPATIENT_CLINIC_OR_DEPARTMENT_OTHER): Payer: Medicare Other

## 2013-06-07 VITALS — BP 121/82 | HR 54 | Temp 97.3°F

## 2013-06-07 DIAGNOSIS — Z5112 Encounter for antineoplastic immunotherapy: Secondary | ICD-10-CM

## 2013-06-07 DIAGNOSIS — E859 Amyloidosis, unspecified: Secondary | ICD-10-CM | POA: Diagnosis not present

## 2013-06-07 LAB — CBC WITH DIFFERENTIAL/PLATELET
BASO%: 0.3 % (ref 0.0–2.0)
EOS%: 0.4 % (ref 0.0–7.0)
MCH: 32.2 pg (ref 27.2–33.4)
MCHC: 32.5 g/dL (ref 32.0–36.0)
MCV: 99 fL — ABNORMAL HIGH (ref 79.3–98.0)
MONO%: 5.3 % (ref 0.0–14.0)
RDW: 13.9 % (ref 11.0–14.6)
lymph#: 0.7 10*3/uL — ABNORMAL LOW (ref 0.9–3.3)

## 2013-06-07 MED ORDER — BORTEZOMIB CHEMO SQ INJECTION 3.5 MG (2.5MG/ML)
3.1000 mg | Freq: Once | INTRAMUSCULAR | Status: AC
  Administered 2013-06-07: 3 mg via SUBCUTANEOUS
  Filled 2013-06-07: qty 3

## 2013-06-07 MED ORDER — ONDANSETRON HCL 8 MG PO TABS
8.0000 mg | ORAL_TABLET | Freq: Once | ORAL | Status: AC
  Administered 2013-06-07: 8 mg via ORAL

## 2013-06-07 NOTE — Patient Instructions (Signed)
[facility] Discharge Instructions for Patients Receiving Chemotherapy  Today you received the following chemotherapy agents: Velcade.  To help prevent nausea and vomiting after your treatment, we encourage you to take your nausea medication as prescribed.   If you develop nausea and vomiting that is not controlled by your nausea medication, call the clinic.   BELOW ARE SYMPTOMS THAT SHOULD BE REPORTED IMMEDIATELY:  *FEVER GREATER THAN 100.5 F  *CHILLS WITH OR WITHOUT FEVER  NAUSEA AND VOMITING THAT IS NOT CONTROLLED WITH YOUR NAUSEA MEDICATION  *UNUSUAL SHORTNESS OF BREATH  *UNUSUAL BRUISING OR BLEEDING  TENDERNESS IN MOUTH AND THROAT WITH OR WITHOUT PRESENCE OF ULCERS  *URINARY PROBLEMS  *BOWEL PROBLEMS  UNUSUAL RASH Items with * indicate a potential emergency and should be followed up as soon as possible.  Feel free to call the clinic you have any questions or concerns. The clinic phone number is [phone].    

## 2013-06-07 NOTE — Telephone Encounter (Signed)
recvd prior auth for xarelto.called  [facility] they will fax approval today 06/07/13

## 2013-06-11 ENCOUNTER — Telehealth: Payer: Self-pay | Admitting: *Deleted

## 2013-06-11 ENCOUNTER — Other Ambulatory Visit: Payer: Self-pay | Admitting: Oncology

## 2013-06-11 NOTE — Telephone Encounter (Signed)
, °

## 2013-06-11 NOTE — Telephone Encounter (Signed)
Pt would like office visit w/ you as soon as possible to discuss his treatment plan and requests a referral to [facility] in [place].  He has a appt on 7/17 but wants to see you sooner if possible.

## 2013-06-13 ENCOUNTER — Ambulatory Visit (HOSPITAL_BASED_OUTPATIENT_CLINIC_OR_DEPARTMENT_OTHER): Payer: Medicare Other | Admitting: Oncology

## 2013-06-13 VITALS — BP 127/71 | HR 98 | Temp 97.5°F | Resp 20 | Ht 71.0 in | Wt 271.2 lb

## 2013-06-13 DIAGNOSIS — E859 Amyloidosis, unspecified: Secondary | ICD-10-CM | POA: Diagnosis not present

## 2013-06-13 NOTE — Progress Notes (Signed)
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 Negative cytogenetics and FISH myeloma panel. Cardiac MRI showed no sign of cardiac dysfunction.   PAST TREATMENT: Velcade, dexamethasone, melphalan x 3 cycles. 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   CURRENT THERAPY: started salvage Velcade for persistent serum free light chain on 09/17/2010 (3 weeks on, 1 week off). His regimen was modified to include Revlimid 5mg  PO d1-21 every 4 weeks and Dexamathasone 10mg  PO qweek in April 2013 to due slightly elevated light chain.  He self-discontinued Revlmid in 04/2013 due to grade 1 fatigue and he thought that "it wasn't doing anything for him."   INTERVAL HISTORY: Johnny Mitchell 72 y.o. male returns for regular follow up with his wife and a grandson.  He had afib with RVR a few weeks ago.  He has been on rate control with Cardizem and Xarelto for embolism prophylaxis.  He has mild to moderate bilateral pedal edema.  He still has high salt intake.  He has mild DOE without PND or orthopnea.  He has mild fatigue; however, he is still working full time and is independent of all activities of daily living. He has chronic back pain from OA which has not worsened.   Patient denies fever, anorexia, weight loss, headache, visual changes, confusion, drenching night sweats, palpable lymph node swelling, mucositis, odynophagia, dysphagia, nausea vomiting, jaundice, chest pain, palpitation, productive cough, gum bleeding, epistaxis, hematemesis, hemoptysis, abdominal pain, abdominal swelling, early satiety, melena, hematochezia, hematuria, skin rash, spontaneous bleeding, joint swelling, joint pain, heat or cold intolerance, bowel bladder  incontinence, focal motor weakness, paresthesia, depression.        Past Medical History  Diagnosis Date  . Hypertension   . Coronary atherosclerosis of unspecified type of vessel, native or graft     Non obstructive  . Cerebrovascular disease, unspecified   . Pure hypercholesterolemia   . Diverticulosis of colon (without mention of hemorrhage)   . Benign neoplasm of colon   . Unspecified hemorrhoids without mention of complication   . Degenerative disc disease   . 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 Hemangioma   . Amyloidosis   . LBBB (left bundle branch block)   . 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 OSA (obstructive sleep apnea)     Past Surgical History  Procedure Laterality Date  . Knee arthroscopy    . Carpal tunnel release  08/2006    Dr Thad Ranger at [facility] - Bilateral  . 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 Back surgery      Lumbar    Current Outpatient Prescriptions  Medication Sig Dispense Refill  . aspirin 81 MG tablet Take 1 tablet (81 mg total) by mouth daily.  30 tablet  0  . atenolol (TENORMIN) 100 MG tablet Take 100 mg by mouth daily.      . bortezomib IV (VELCADE) 3.5 MG injection Inject 1.3 mg/m2 into the vein as directed. Takes every Thursday. Three weeks on, one week off.      . dexamethasone (DECADRON) 2 MG tablet Take 10 mg by mouth every Thursday. On chemo days      . diltiazem (CARDIZEM CD)  120 MG 24 hr capsule Take 1 capsule (120 mg total) by mouth daily.  30 capsule  0  . furosemide (LASIX) 40 MG tablet Take 1 tablet (40 mg total) by mouth 2 (two) times daily. Take 1 tab in am ( 40 mg) and 1/2 tab in pm ( 20 mg ) . Total 60 mg daily  30 tablet  0  . [START ON 06/22/2013] Rivaroxaban (XARELTO) 20 MG TABS Take 1 tablet (20 mg total) by mouth daily with supper.  30 tablet  11   No current facility-administered medications for this visit.    ALLERGIES:  has No Known Allergies.  REVIEW  OF SYSTEMS:  The rest of the 14-point review of system was negative.   Filed Vitals:   06/13/13 1019  BP: 127/71  Pulse: 98  Temp: 97.5 F (36.4 C)  Resp: 20   Wt Readings from Last 3 Encounters:  06/13/13 271 lb 3.2 oz (123.016 kg)  06/04/13 265 lb 3.2 oz (120.294 kg)  05/31/13 271 lb 2.7 oz (123 kg)   ECOG Performance status: 1  PHYSICAL EXAMINATION:   General:  Mildly obese man, in no acute distress.  Eyes:  no scleral icterus.  ENT:  There were no oropharyngeal lesions.  Neck was without thyromegaly.  Lymphatics:  Negative cervical, supraclavicular or axillary adenopathy.  Respiratory: lungs were clear bilaterally without wheezing or crackles.  Cardiovascular:  Regular rate and rhythm, S1/S2, without murmur, rub or gallop.  There was 1+ bilateral pedal edema.  GI:  abdomen was soft, flat, nontender, nondistended, without organomegaly.  Muscoloskeletal:  no spinal tenderness of palpation of vertebral spine.  Skin exam was without echymosis, petichae.  Neuro exam was nonfocal.  Patient was able to get on and off exam table without assistance.  Gait was normal.  Patient was alert and oriented.  Attention was good.   Language was appropriate.  Mood was normal without depression.  Speech was not pressured.  Thought content was not tangential.      LABORATORY/RADIOLOGY DATA:  Lab Results  Component Value Date   WBC 7.5 06/07/2013   HGB 13.4 06/07/2013   HCT 41.2 06/07/2013   PLT 196 06/07/2013   GLUCOSE 126* 06/01/2013   CHOL 155 01/15/2009   TRIG 77 01/15/2009   HDL 39.9 01/15/2009   LDLCALC 100* 01/15/2009   ALKPHOS 78 05/31/2013   ALT 15 05/31/2013   AST 14 05/31/2013   NA 138 06/01/2013   K 4.1 06/01/2013   CL 100 06/01/2013   CREATININE 1.47* 06/01/2013   BUN 30* 06/01/2013   CO2 30 06/01/2013   PSA 0.81 01/15/2009   INR 1.02 09/30/2011    ASSESSMENT AND PLAN:   1. History of amyloidosis:   -  He has been on maintenance Velcade since 09/2010.  Normally, maintenance chemo in myeloma is  two years.  However, we extrapolated the data from myeloma to use maintenance chemo in amyloidosis.  Given the agressiveness of this disease, decision in 08/2012 was to continue for one more year.  He has grade 1-2 edema, grade 1 fatigue.  However, he does not have dose limiting toxicity.  He is concerned that he is having disease progression in his heart causing Afib.  Multiple studies in the past with cardiac MRI and echo were not convincing for heart failure or cardiac deposition of amyloid.  He would like see if he is a candidate for clinical trial NCT [id number] which is a phase I study evaluating NEOD 001 in  patients s/p BMT for amyloidosis with residual organ involvement.  I made this referral.  He would like to continue Velcade/Dex in the meantime.  2. Pedal edema. He is on Lasix.  3. Hypertension. on atenolol and Lasix per Dr. Jens Som. 4. Afib:  On Dilt and Xarelto per Cardiology.  5. Follow up: Velcade SQ (3wks on, 1 wk off).   Return visit in about 2 months.    I informed Mr. Fassnacht that I am leaving the service. [facility] will [facility] for him to see a new provider in August 2014.    The length of time of the face-to-face encounter was 25  minutes. More than 50% of time was spent counseling and coordination of care.

## 2013-06-14 ENCOUNTER — Other Ambulatory Visit (HOSPITAL_BASED_OUTPATIENT_CLINIC_OR_DEPARTMENT_OTHER): Payer: Medicare Other

## 2013-06-14 ENCOUNTER — Telehealth: Payer: Self-pay | Admitting: Cardiology

## 2013-06-14 ENCOUNTER — Ambulatory Visit (HOSPITAL_BASED_OUTPATIENT_CLINIC_OR_DEPARTMENT_OTHER): Payer: Medicare Other

## 2013-06-14 VITALS — BP 114/72 | HR 81 | Temp 97.8°F | Resp 19 | Ht 71.0 in

## 2013-06-14 DIAGNOSIS — Z5112 Encounter for antineoplastic immunotherapy: Secondary | ICD-10-CM

## 2013-06-14 DIAGNOSIS — E859 Amyloidosis, unspecified: Secondary | ICD-10-CM

## 2013-06-14 LAB — CBC WITH DIFFERENTIAL/PLATELET
Basophils Absolute: 0 10*3/uL (ref 0.0–0.1)
EOS%: 0.6 % (ref 0.0–7.0)
Eosinophils Absolute: 0.1 10*3/uL (ref 0.0–0.5)
HGB: 13.6 g/dL (ref 13.0–17.1)
MCH: 32.2 pg (ref 27.2–33.4)
MCV: 99.3 fL — ABNORMAL HIGH (ref 79.3–98.0)
MONO%: 6 % (ref 0.0–14.0)
NEUT#: 6.8 10*3/uL — ABNORMAL HIGH (ref 1.5–6.5)
RBC: 4.23 10*6/uL (ref 4.20–5.82)
RDW: 14.1 % (ref 11.0–14.6)
lymph#: 0.7 10*3/uL — ABNORMAL LOW (ref 0.9–3.3)

## 2013-06-14 MED ORDER — ONDANSETRON HCL 8 MG PO TABS
8.0000 mg | ORAL_TABLET | Freq: Once | ORAL | Status: AC
  Administered 2013-06-14: 8 mg via ORAL

## 2013-06-14 MED ORDER — BORTEZOMIB CHEMO SQ INJECTION 3.5 MG (2.5MG/ML)
3.1000 mg | Freq: Once | INTRAMUSCULAR | Status: AC
  Administered 2013-06-14: 3 mg via SUBCUTANEOUS
  Filled 2013-06-14: qty 3

## 2013-06-14 NOTE — Telephone Encounter (Signed)
New Prob     Pt states he is has some questions regarding XARELTO. States pharm said it needs to be pre approved.

## 2013-06-14 NOTE — Telephone Encounter (Signed)
Left message for pt, will call the pharm and get the number for prior auth and call.

## 2013-06-18 ENCOUNTER — Encounter: Payer: Self-pay | Admitting: *Deleted

## 2013-06-18 NOTE — Telephone Encounter (Signed)
Follow up ° ° °Pt returning your call °

## 2013-06-18 NOTE — Telephone Encounter (Signed)
Called and spoke with [employer], they are needing a letter stating the dx code, if the pt has a valve and if this was a cont from hospital d/c faxed to [phone]. Letter generated and placed in medical records for faxing. Pt made aware.

## 2013-06-19 ENCOUNTER — Telehealth: Payer: Self-pay | Admitting: Cardiology

## 2013-06-19 ENCOUNTER — Telehealth: Payer: Self-pay | Admitting: Oncology

## 2013-06-19 NOTE — Telephone Encounter (Signed)
New Problem  Johnny Mitchell from [facility] is calling regarding an appeal for Xarelto,

## 2013-06-19 NOTE — Telephone Encounter (Signed)
Left message for pt to call.

## 2013-06-19 NOTE — Telephone Encounter (Signed)
Faxed pt medical records to Dr. Bluford Main office @ [facility].   Dr. Dion Saucier will evaluate pt records to see if pt is a candidate for clinical trial nct [id number].  Pt is aware.

## 2013-06-21 NOTE — Telephone Encounter (Signed)
Left message for Johnny Mitchell to call

## 2013-06-23 DIAGNOSIS — S81009A Unspecified open wound, unspecified knee, initial encounter: Secondary | ICD-10-CM | POA: Diagnosis not present

## 2013-06-23 DIAGNOSIS — S91009A Unspecified open wound, unspecified ankle, initial encounter: Secondary | ICD-10-CM | POA: Diagnosis not present

## 2013-06-25 ENCOUNTER — Telehealth: Payer: Self-pay | Admitting: Nurse Practitioner

## 2013-06-25 NOTE — Telephone Encounter (Signed)
At checkout patient c/o SOB anytime he exerts energy.  I greeted the patient in check out area and escorted him to an exam room.  The patient ambulates without difficulty, is in no acute distress, speaks clearly and is alert & oriented to person, place, time.  Patient's BP 120/88, HR 85, O2 sat 96% on room air.  Patient states that when he is at rest he does not have any symptoms but that when he exerts himself at all, that he experiences SOB.  Patient would also like a Rx refill for Diltiazem which was prescribed by hospitalist in June.  Patient has bilateral clear lung sounds upon ausculation, skin warm dry & acyanotic, and abdomen is soft and non-distended.  Patient states his s/s may be related to his amyloidosis.  Patient has an appointment with Tereso Newcomer, PA-C on 7/23.  I advised patient that I could possibly have him seen this week if he prefers. Patient states he would rather wait until a day when Dr. Jens Som is in the office and he knows Dr. Jens Som is on vacation this week.  Patient states he prefers to keep his appointment for next week.  I advised patient that Tereso Newcomer, PA-C would evaluate the need to continue his Diltiazem at that time and therefore I would not send a refill today.  Patient verbalized understanding and agreement with plan of care and expressed gratitude for my help today.  Patient was discharged in no acute distress.

## 2013-06-27 ENCOUNTER — Telehealth: Payer: Self-pay | Admitting: *Deleted

## 2013-06-27 NOTE — Telephone Encounter (Signed)
Spoke with pt, aware xarelto was approved by his insurance.

## 2013-06-27 NOTE — Telephone Encounter (Signed)
Informed pt on no need for office visit as scheduled for tomorrow since he saw Dr. Gaylyn Rong on 7/02.   Keep lab and chemo appt.  Offered to move lab appt up to closer to chemo appt but pt states he wants to keep lab at 9 am in hopes that they may be able to take him for chemo sooner.  Informed pt they may not be able to take him until scheduled time at 10:30 am and he said he will still come at 9 am for lab and see if he can get chemo any sooner.    Canceled office visit from schedule.

## 2013-06-28 ENCOUNTER — Other Ambulatory Visit (HOSPITAL_BASED_OUTPATIENT_CLINIC_OR_DEPARTMENT_OTHER): Payer: Medicare Other

## 2013-06-28 ENCOUNTER — Ambulatory Visit: Payer: Medicare Other | Admitting: Oncology

## 2013-06-28 ENCOUNTER — Ambulatory Visit (HOSPITAL_BASED_OUTPATIENT_CLINIC_OR_DEPARTMENT_OTHER): Payer: Medicare Other

## 2013-06-28 VITALS — BP 137/92 | HR 113 | Temp 98.2°F | Resp 18

## 2013-06-28 DIAGNOSIS — Z5112 Encounter for antineoplastic immunotherapy: Secondary | ICD-10-CM | POA: Diagnosis not present

## 2013-06-28 DIAGNOSIS — E859 Amyloidosis, unspecified: Secondary | ICD-10-CM

## 2013-06-28 DIAGNOSIS — M47817 Spondylosis without myelopathy or radiculopathy, lumbosacral region: Secondary | ICD-10-CM | POA: Diagnosis not present

## 2013-06-28 LAB — CBC WITH DIFFERENTIAL/PLATELET
Eosinophils Absolute: 0 10*3/uL (ref 0.0–0.5)
LYMPH%: 4.1 % — ABNORMAL LOW (ref 14.0–49.0)
MCHC: 32.6 g/dL (ref 32.0–36.0)
MCV: 99 fL — ABNORMAL HIGH (ref 79.3–98.0)
MONO%: 1.3 % (ref 0.0–14.0)
Platelets: 182 10*3/uL (ref 140–400)
RBC: 3.9 10*6/uL — ABNORMAL LOW (ref 4.20–5.82)

## 2013-06-28 LAB — COMPREHENSIVE METABOLIC PANEL (CC13)
Albumin: 3.1 g/dL — ABNORMAL LOW (ref 3.5–5.0)
Alkaline Phosphatase: 105 U/L (ref 40–150)
BUN: 23.2 mg/dL (ref 7.0–26.0)
Calcium: 9.1 mg/dL (ref 8.4–10.4)
Glucose: 105 mg/dl (ref 70–140)
Potassium: 4.5 mEq/L (ref 3.5–5.1)

## 2013-06-28 MED ORDER — ONDANSETRON HCL 8 MG PO TABS
8.0000 mg | ORAL_TABLET | Freq: Once | ORAL | Status: AC
  Administered 2013-06-28: 8 mg via ORAL

## 2013-06-28 MED ORDER — BORTEZOMIB CHEMO SQ INJECTION 3.5 MG (2.5MG/ML)
3.1000 mg | Freq: Once | INTRAMUSCULAR | Status: AC
  Administered 2013-06-28: 3 mg via SUBCUTANEOUS
  Filled 2013-06-28: qty 3

## 2013-06-28 NOTE — Patient Instructions (Addendum)
[facility] Discharge Instructions for Patients Receiving Chemotherapy  Today you received the following chemotherapy agents VELCADE To help prevent nausea and vomiting after your treatment, we encourage you to take your nausea medication   If you develop nausea and vomiting that is not controlled by your nausea medication, call the clinic.   BELOW ARE SYMPTOMS THAT SHOULD BE REPORTED IMMEDIATELY:  *FEVER GREATER THAN 100.5 F  *CHILLS WITH OR WITHOUT FEVER  NAUSEA AND VOMITING THAT IS NOT CONTROLLED WITH YOUR NAUSEA MEDICATION  *UNUSUAL SHORTNESS OF BREATH  *UNUSUAL BRUISING OR BLEEDING  TENDERNESS IN MOUTH AND THROAT WITH OR WITHOUT PRESENCE OF ULCERS  *URINARY PROBLEMS  *BOWEL PROBLEMS  UNUSUAL RASH Items with * indicate a potential emergency and should be followed up as soon as possible.  Feel free to call the clinic you have any questions or concerns. The clinic phone number is [phone].    

## 2013-06-29 LAB — KAPPA/LAMBDA LIGHT CHAINS: Kappa free light chain: 2.84 mg/dL — ABNORMAL HIGH (ref 0.33–1.94)

## 2013-07-04 ENCOUNTER — Ambulatory Visit: Payer: Medicare Other | Admitting: Physician Assistant

## 2013-07-05 ENCOUNTER — Encounter: Payer: Self-pay | Admitting: *Deleted

## 2013-07-05 ENCOUNTER — Ambulatory Visit (HOSPITAL_BASED_OUTPATIENT_CLINIC_OR_DEPARTMENT_OTHER): Payer: Medicare Other

## 2013-07-05 ENCOUNTER — Other Ambulatory Visit (HOSPITAL_BASED_OUTPATIENT_CLINIC_OR_DEPARTMENT_OTHER): Payer: Medicare Other

## 2013-07-05 VITALS — BP 120/83 | HR 95 | Temp 97.5°F | Resp 18

## 2013-07-05 DIAGNOSIS — E859 Amyloidosis, unspecified: Secondary | ICD-10-CM

## 2013-07-05 DIAGNOSIS — Z006 Encounter for examination for normal comparison and control in clinical research program: Secondary | ICD-10-CM | POA: Diagnosis not present

## 2013-07-05 DIAGNOSIS — Z5112 Encounter for antineoplastic immunotherapy: Secondary | ICD-10-CM

## 2013-07-05 LAB — CBC WITH DIFFERENTIAL/PLATELET
Basophils Absolute: 0 10*3/uL (ref 0.0–0.1)
Eosinophils Absolute: 0 10*3/uL (ref 0.0–0.5)
HGB: 13.5 g/dL (ref 13.0–17.1)
MCV: 99 fL — ABNORMAL HIGH (ref 79.3–98.0)
MONO#: 0.2 10*3/uL (ref 0.1–0.9)
NEUT#: 7.3 10*3/uL — ABNORMAL HIGH (ref 1.5–6.5)
RDW: 13.9 % (ref 11.0–14.6)
WBC: 8.2 10*3/uL (ref 4.0–10.3)
lymph#: 0.6 10*3/uL — ABNORMAL LOW (ref 0.9–3.3)

## 2013-07-05 MED ORDER — ONDANSETRON HCL 8 MG PO TABS
8.0000 mg | ORAL_TABLET | Freq: Once | ORAL | Status: AC
  Administered 2013-07-05: 8 mg via ORAL

## 2013-07-05 MED ORDER — BORTEZOMIB CHEMO SQ INJECTION 3.5 MG (2.5MG/ML)
3.1000 mg | Freq: Once | INTRAMUSCULAR | Status: AC
  Administered 2013-07-05: 3 mg via SUBCUTANEOUS
  Filled 2013-07-05: qty 3

## 2013-07-05 NOTE — Patient Instructions (Addendum)
[facility] Discharge Instructions for Patients Receiving Chemotherapy  Today you received the following chemotherapy agents velcade  To help prevent nausea and vomiting after your treatment, we encourage you to take your nausea medication and avoid foods that are greasy, fried, spicy.    If you develop nausea and vomiting that is not controlled by your nausea medication, call the clinic.   BELOW ARE SYMPTOMS THAT SHOULD BE REPORTED IMMEDIATELY:  *FEVER GREATER THAN 100.5 F  *CHILLS WITH OR WITHOUT FEVER  NAUSEA AND VOMITING THAT IS NOT CONTROLLED WITH YOUR NAUSEA MEDICATION  *UNUSUAL SHORTNESS OF BREATH  *UNUSUAL BRUISING OR BLEEDING  TENDERNESS IN MOUTH AND THROAT WITH OR WITHOUT PRESENCE OF ULCERS  *URINARY PROBLEMS  *BOWEL PROBLEMS  UNUSUAL RASH Items with * indicate a potential emergency and should be followed up as soon as possible.  Feel free to call the clinic should you have any questions or concerns. The clinic phone number is [phone].

## 2013-07-06 ENCOUNTER — Encounter: Payer: Self-pay | Admitting: Cardiology

## 2013-07-06 ENCOUNTER — Other Ambulatory Visit: Payer: Self-pay | Admitting: Cardiology

## 2013-07-06 ENCOUNTER — Ambulatory Visit (INDEPENDENT_AMBULATORY_CARE_PROVIDER_SITE_OTHER): Payer: Medicare Other | Admitting: Cardiology

## 2013-07-06 ENCOUNTER — Encounter: Payer: Self-pay | Admitting: *Deleted

## 2013-07-06 VITALS — BP 104/60 | HR 83 | Ht 71.0 in | Wt 273.4 lb

## 2013-07-06 DIAGNOSIS — E859 Amyloidosis, unspecified: Secondary | ICD-10-CM | POA: Diagnosis not present

## 2013-07-06 DIAGNOSIS — I2510 Atherosclerotic heart disease of native coronary artery without angina pectoris: Secondary | ICD-10-CM | POA: Diagnosis not present

## 2013-07-06 DIAGNOSIS — I471 Supraventricular tachycardia: Secondary | ICD-10-CM

## 2013-07-06 DIAGNOSIS — I10 Essential (primary) hypertension: Secondary | ICD-10-CM

## 2013-07-06 DIAGNOSIS — I4891 Unspecified atrial fibrillation: Secondary | ICD-10-CM

## 2013-07-06 NOTE — Patient Instructions (Addendum)
Your physician recommends that you schedule a follow-up appointment in: 8 WEEKS WITH DR CRENSHAW  Your physician has recommended that you have a Cardioversion (DCCV). Electrical Cardioversion uses a jolt of electricity to your heart either through paddles or wired patches attached to your chest. This is a controlled, usually prescheduled, procedure. Defibrillation is done under light anesthesia in the hospital, and you usually go home the day of the procedure. This is done to get your heart back into a normal rhythm. You are not awake for the procedure. Please see the instruction sheet given to you today.    

## 2013-07-06 NOTE — Assessment & Plan Note (Signed)
Not on aspirin given need for anticoagulation. 

## 2013-07-06 NOTE — Assessment & Plan Note (Signed)
Continue present medications. 

## 2013-07-06 NOTE — Assessment & Plan Note (Signed)
Management per hematology oncology. 

## 2013-07-06 NOTE — Assessment & Plan Note (Signed)
Continue beta blocker. 

## 2013-07-06 NOTE — Progress Notes (Signed)
HPI: Pleasant male for fu of atrial fibrillation. 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 LHC 10/2009: pLAD 205, dLAD 30%, mOM2 70-80% (unchanged compared to previous), pRCA 20%, dRCA 20%. Cardiac MRI in January of 2011 showed an ejection fraction of 57%, mild biatrial enlargement, mild right ventricular enlargement. Cardiac amyloid could not be definitively excluded. Patient was admitted in February of 2012 with a wide complex tachycardia. Seen by EP and felt most likely SVT with aberrancy. Treated transiently with amiodarone but then placed on beta blockade. It was felt that if symptoms recur ablation would be warranted. Echo 10/2012: Abnormal septal motion from ?bundle branch block, moderate LVH, EF 55-60%, mild MR, moderate LAE mild RAE, PASP 53. Admitted with atrial fibrillation 6/14 and treated with anticoagulation and rate control. TSH normal. Plan was cardioversion if his atrial fibrillation persisted. Since he was last seen, he has noticed increasing dyspnea on exertion. He also has mild increased pedal edema. No chest pain, palpitations or syncope. No orthopnea.   Current Outpatient Prescriptions  Medication Sig Dispense Refill  . aspirin 81 MG tablet Take 1 tablet (81 mg total) by mouth daily.  30 tablet  0  . atenolol (TENORMIN) 100 MG tablet Take 100 mg by mouth daily.      . bortezomib IV (VELCADE) 3.5 MG injection Inject 1.3 mg/m2 into the vein as directed. Takes every Thursday. Three weeks on, one week off.      . dexamethasone (DECADRON) 2 MG tablet Take 10 mg by mouth every Thursday. On chemo days      . furosemide (LASIX) 40 MG tablet Take 1 tablet (40 mg total) by mouth 2 (two) times daily. Take 1 tab in am ( 40 mg) and 1/2 tab in pm ( 20 mg ) . Total 60 mg daily  30 tablet  0  . Rivaroxaban (XARELTO) 20 MG TABS Take 1 tablet (20 mg total) by mouth daily with supper.  30 tablet  11   No current facility-administered  medications for this visit.     Past Medical History  Diagnosis Date  . Hypertension   . Coronary atherosclerosis of unspecified type of vessel, native or graft     Non obstructive  . Cerebrovascular disease, unspecified   . Pure hypercholesterolemia   . Diverticulosis of colon (without mention of hemorrhage)   . Benign neoplasm of colon   . Unspecified hemorrhoids without mention of complication   . Degenerative disc disease   . 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 Hemangioma   . Amyloidosis   . LBBB (left bundle branch block)   . 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 OSA (obstructive sleep apnea)   . Atrial fibrillation     Past Surgical History  Procedure Laterality Date  . Knee arthroscopy    . Carpal tunnel release  08/2006    Dr Thad Ranger at [facility] - Bilateral  . 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 Back surgery      Lumbar    History   Social History  . Marital Status: Married    Spouse Name: N/A    Number of Children: 2  . Years of Education: N/A   Occupational History  . PRESIDENT     [employer]   Social History Main Topics  . Smoking status: Never Smoker   . Smokeless tobacco: Never Used  .  Alcohol Use: Yes  . Drug Use: No  . Sexually Active: No   Other Topics Concern  . Not on file   Social History Narrative   Lives with wife.      ROS: no fevers or chills, productive cough, hemoptysis, dysphasia, odynophagia, melena, hematochezia, dysuria, hematuria, rash, seizure activity, orthopnea, PND, pedal edema, claudication. Remaining systems are negative.  Physical Exam: Well-developed well-nourished in no acute distress.  Skin is warm and dry.  HEENT is normal.  Neck is supple.  Chest is clear to auscultation with normal expansion.  Cardiovascular exam is irregular Abdominal exam nontender or distended. No masses palpated. Extremities show 2+ ankle edema. neuro grossly  intact  ECG atrial fibrillation with left bundle branch block.

## 2013-07-06 NOTE — Assessment & Plan Note (Addendum)
Patient remains in atrial fibrillation.I think his increasing dyspnea on exertion and edema is related to his new rhythm disturbance. He has been on xeralto for greater than 3 weeks. Continue atenolol. I will proceed with cardioversion as I think sinus rhythm would improve his symptoms. If he does not maintain sinus rhythm and he will most likely need an antiarrhythmic such as amiodarone or tikosyn. Change Lasix to 40 mg daily. Check potassium and renal function in one week.

## 2013-07-09 ENCOUNTER — Telehealth: Payer: Self-pay | Admitting: Oncology

## 2013-07-09 NOTE — Telephone Encounter (Signed)
Dr. Herbert Seta Landau's office is in touch with pt. To set up appt.  Medical records faxed

## 2013-07-10 ENCOUNTER — Ambulatory Visit (HOSPITAL_COMMUNITY): Payer: Medicare Other | Admitting: Anesthesiology

## 2013-07-10 ENCOUNTER — Telehealth: Payer: Self-pay | Admitting: *Deleted

## 2013-07-10 ENCOUNTER — Encounter (HOSPITAL_COMMUNITY)
Admission: RE | Disposition: A | Discharge status: Home / Self Care | Payer: Self-pay | Source: Ambulatory Visit | Attending: Cardiology

## 2013-07-10 ENCOUNTER — Ambulatory Visit (HOSPITAL_COMMUNITY)
Admission: RE | Admit: 2013-07-10 | Discharge: 2013-07-10 | Disposition: A | Discharge status: Home / Self Care | Payer: Medicare Other | Source: Ambulatory Visit | Attending: Cardiology | Admitting: Cardiology

## 2013-07-10 ENCOUNTER — Encounter (HOSPITAL_COMMUNITY): Payer: Self-pay | Admitting: Gastroenterology

## 2013-07-10 ENCOUNTER — Encounter (HOSPITAL_COMMUNITY): Payer: Self-pay | Admitting: Anesthesiology

## 2013-07-10 DIAGNOSIS — I471 Supraventricular tachycardia, unspecified: Secondary | ICD-10-CM | POA: Insufficient documentation

## 2013-07-10 DIAGNOSIS — I10 Essential (primary) hypertension: Secondary | ICD-10-CM | POA: Diagnosis not present

## 2013-07-10 DIAGNOSIS — E859 Amyloidosis, unspecified: Secondary | ICD-10-CM | POA: Insufficient documentation

## 2013-07-10 DIAGNOSIS — Z79899 Other long term (current) drug therapy: Secondary | ICD-10-CM | POA: Diagnosis not present

## 2013-07-10 DIAGNOSIS — I2510 Atherosclerotic heart disease of native coronary artery without angina pectoris: Secondary | ICD-10-CM | POA: Diagnosis not present

## 2013-07-10 DIAGNOSIS — IMO0002 Reserved for concepts with insufficient information to code with codable children: Secondary | ICD-10-CM | POA: Insufficient documentation

## 2013-07-10 DIAGNOSIS — Z7982 Long term (current) use of aspirin: Secondary | ICD-10-CM | POA: Insufficient documentation

## 2013-07-10 DIAGNOSIS — G4710 Hypersomnia, unspecified: Secondary | ICD-10-CM | POA: Diagnosis not present

## 2013-07-10 DIAGNOSIS — I4891 Unspecified atrial fibrillation: Secondary | ICD-10-CM | POA: Insufficient documentation

## 2013-07-10 DIAGNOSIS — G4733 Obstructive sleep apnea (adult) (pediatric): Secondary | ICD-10-CM | POA: Insufficient documentation

## 2013-07-10 DIAGNOSIS — Z9481 Bone marrow transplant status: Secondary | ICD-10-CM | POA: Insufficient documentation

## 2013-07-10 DIAGNOSIS — K5730 Diverticulosis of large intestine without perforation or abscess without bleeding: Secondary | ICD-10-CM | POA: Insufficient documentation

## 2013-07-10 DIAGNOSIS — I447 Left bundle-branch block, unspecified: Secondary | ICD-10-CM | POA: Insufficient documentation

## 2013-07-10 DIAGNOSIS — E7800 Pure hypercholesterolemia, unspecified: Secondary | ICD-10-CM | POA: Insufficient documentation

## 2013-07-10 DIAGNOSIS — G4730 Sleep apnea, unspecified: Secondary | ICD-10-CM | POA: Diagnosis not present

## 2013-07-10 DIAGNOSIS — M479 Spondylosis, unspecified: Secondary | ICD-10-CM | POA: Diagnosis not present

## 2013-07-10 HISTORY — PX: CARDIOVERSION: SHX1299

## 2013-07-10 HISTORY — DX: Malignant (primary) neoplasm, unspecified: C80.1

## 2013-07-10 SURGERY — CARDIOVERSION
Anesthesia: General

## 2013-07-10 MED ORDER — SODIUM CHLORIDE 0.9 % IV SOLN
INTRAVENOUS | Status: DC
  Administered 2013-07-10: 500 mL via INTRAVENOUS
  Administered 2013-07-10: 13:00:00 via INTRAVENOUS

## 2013-07-10 MED ORDER — PROPOFOL 10 MG/ML IV BOLUS
INTRAVENOUS | Status: DC | PRN
  Administered 2013-07-10: 70 mg via INTRAVENOUS

## 2013-07-10 MED ORDER — ATENOLOL 100 MG PO TABS: 50.0000 mg | ORAL_TABLET | Freq: Every day | ORAL | Status: DC

## 2013-07-10 MED ORDER — AMIODARONE HCL 200 MG PO TABS: 400.0000 mg | ORAL_TABLET | Freq: Two times a day (BID) | ORAL | Status: DC

## 2013-07-10 MED ORDER — LIDOCAINE HCL (CARDIAC) 20 MG/ML IV SOLN
INTRAVENOUS | Status: DC | PRN
  Administered 2013-07-10: 40 mg via INTRAVENOUS

## 2013-07-10 MED ORDER — AMIODARONE HCL 200 MG PO TABS: 200.0000 mg | ORAL_TABLET | Freq: Every day | ORAL | Status: DC

## 2013-07-10 NOTE — Anesthesia Procedure Notes (Signed)
Procedure Name: MAC Date/Time: 07/03/2013 1:10 PM Performed by: Ellin Goodie Pre-anesthesia Checklist: Patient identified, Emergency Drugs available, Suction available, Patient being monitored and Timeout performed Patient Re-evaluated:Patient Re-evaluated prior to inductionOxygen Delivery Method: Ambu bag Preoxygenation: Pre-oxygenation with 100% oxygen Intubation Type: IV induction Ventilation: Mask ventilation without difficulty Placement Confirmation: breath sounds checked- equal and bilateral Dental Injury: Teeth and Oropharynx as per pre-operative assessment

## 2013-07-10 NOTE — Procedures (Addendum)
Electrical Cardioversion Procedure Note Johnny Mitchell [id number] 11-16-1941  Procedure: Electrical Cardioversion Indications:  Atrial Fibrillation  Procedure Details Consent: Risks of procedure as well as the alternatives and risks of each were explained to the (patient/caregiver).  Consent for procedure obtained. Time Out: Verified patient identification, verified procedure, site/side was marked, verified correct patient position, special equipment/implants available, medications/allergies/relevent history reviewed, required imaging and test results available.  Performed  Patient placed on cardiac monitor, pulse oximetry, supplemental oxygen as necessary.  Sedation given: Patient sedated by anesthesia with lidocaine 40 mg and diprovan 70 mg IV. Pacer pads placed anterior and posterior chest.  Cardioverted 2 time(s).  Cardioverted at 120J with transient sinus rhythm but atrial fibrillation recurred; second attempt with 150 joules again resulted in transient sinus followed by recurrent atrial fibrillation. Patient then converted back to sinus spontaneously.  Evaluation Findings: Post procedure EKG shows: sinus rhythm. Complications: None Patient did tolerate procedure well. If atrial fibrillation recurs, will treat with amiodarone.  [name] 07/10/2013, 1:02 PM   Patient subsequently reverted back to atrial fibrillation; begin amiodarone 400 BID for 2 weeks, then 200 mg daily; change atenolol to 50 mg daily. [name]

## 2013-07-10 NOTE — Telephone Encounter (Signed)
Pt over at the hosp for DCCV, per dr Jens Som the pt will start amiodarone 400 mg bid x 2 weeks then 200 mg daily. He will decrease atenolol to 50 mg once daily

## 2013-07-10 NOTE — Anesthesia Postprocedure Evaluation (Signed)
  Anesthesia Post-op Note  Patient: Johnny Mitchell  Procedure(s) Performed: Procedure(s): CARDIOVERSION (N/A)  Patient Location: PACU  Anesthesia Type:General  Level of Consciousness: awake, alert  and oriented  Airway and Oxygen Therapy: Patient connected to nasal cannula oxygen  Post-op Pain: none  Post-op Assessment: Post-op Vital signs reviewed  Post-op Vital Signs: stable  Complications: No apparent anesthesia complications

## 2013-07-10 NOTE — H&P (Signed)
Johnny Mitchell  07/06/2013 11:30 AM   Office Visit  MRN:  [id number]   Description: 71 year old male  Provider: Lewayne Bunting, MD  Department: [facility]        Referring Provider    Michele Mcalpine, MD      Diagnoses    Atrial fibrillation    -  Primary    427.31    Amyloidosis, unspecified        277.30    Coronary atherosclerosis of unspecified type of vessel, native or graft        414.00    Unspecified essential hypertension        401.9    Paroxysmal supraventricular tachycardia        427.0      Reason for Visit    Follow-up    Atrial fibrillation         Progress Notes    Lewayne Bunting, MD at 07/06/2013 12:40 PM    Status: Signed                      HPI: Pleasant male for fu of atrial fibrillation. 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 LHC 10/2009: pLAD 205, dLAD 30%, mOM2 70-80% (unchanged compared to previous), pRCA 20%, dRCA 20%. Cardiac MRI in January of 2011 showed an ejection fraction of 57%, mild biatrial enlargement, mild right ventricular enlargement. Cardiac amyloid could not be definitively excluded. Patient was admitted in February of 2012 with a wide complex tachycardia. Seen by EP and felt most likely SVT with aberrancy. Treated transiently with amiodarone but then placed on beta blockade. It was felt that if symptoms recur ablation would be warranted. Echo 10/2012: Abnormal septal motion from ?bundle branch block, moderate LVH, EF 55-60%, mild MR, moderate LAE mild RAE, PASP 53. Admitted with atrial fibrillation 6/14 and treated with anticoagulation and rate control. TSH normal. Plan was cardioversion if his atrial fibrillation persisted. Since he was last seen, he has noticed increasing dyspnea on exertion. He also has mild increased pedal edema. No chest pain, palpitations or syncope. No orthopnea.      Current Outpatient Prescriptions   Medication  Sig  Dispense  Refill    .  aspirin 81 MG tablet  Take 1 tablet (81 mg total) by mouth daily.   30 tablet   0   .  atenolol (TENORMIN) 100 MG tablet  Take 100 mg by mouth daily.         .  bortezomib IV (VELCADE) 3.5 MG injection  Inject 1.3 mg/m2 into the vein as directed. Takes every Thursday. Three weeks on, one week off.         .  dexamethasone (DECADRON) 2 MG tablet  Take 10 mg by mouth every Thursday. On chemo days         .  furosemide (LASIX) 40 MG tablet  Take 1 tablet (40 mg total) by mouth 2 (two) times daily. Take 1 tab in am ( 40 mg) and 1/2 tab in pm ( 20 mg ) . Total 60 mg daily   30 tablet   0   .  Rivaroxaban (XARELTO) 20 MG TABS  Take 1 tablet (20 mg total) by mouth daily with supper.   30 tablet   11       No current facility-administered medications for this visit.  Past Medical History   Diagnosis  Date   .  Hypertension     .  Coronary atherosclerosis of unspecified type of vessel, native or graft         Non obstructive   .  Cerebrovascular disease, unspecified     .  Pure hypercholesterolemia     .  Diverticulosis of colon (without mention of hemorrhage)     .  Benign neoplasm of colon     .  Unspecified hemorrhoids without mention of complication     .  Degenerative disc disease     .  Spondylosis of unspecified site without mention of myelopathy     .  Hemangioma     .  Amyloidosis     .  LBBB (left bundle branch block)     .  Wide-complex tachycardia  01/2011       Felt likely be SVT with abbarancy   .  OSA (obstructive sleep apnea)     .  Atrial fibrillation           Past Surgical History   Procedure  Laterality  Date   .  Knee arthroscopy       .  Carpal tunnel release    08/2006       Dr Thad Ranger at [facility] - Bilateral   .  Bone marrow transplant    june 2011   .  Back surgery           Lumbar         History       Social History   .  Marital Status:  Married       Spouse Name:  N/A       Number of Children:  2   .  Years of Education:  N/A        Occupational History   .  PRESIDENT         [employer]       Social History Main Topics   .  Smoking status:  Never Smoker    .  Smokeless tobacco:  Never Used   .  Alcohol Use:  Yes   .  Drug Use:  No   .  Sexually Active:  No       Other Topics  Concern   .  Not on file       Social History Narrative     Lives with wife.          ROS: no fevers or chills, productive cough, hemoptysis, dysphasia, odynophagia, melena, hematochezia, dysuria, hematuria, rash, seizure activity, orthopnea, PND, pedal edema, claudication. Remaining systems are negative.   Physical Exam: Well-developed well-nourished in no acute distress.   Skin is warm and dry.   HEENT is normal.   Neck is supple.   Chest is clear to auscultation with normal expansion.   Cardiovascular exam is irregular Abdominal exam nontender or distended. No masses palpated. Extremities show 2+ ankle edema. neuro grossly intact   ECG atrial fibrillation with left bundle branch block.                  Atrial fibrillation - Lewayne Bunting, MD at 07/06/2013 12:21 PM    Status: Edited Related Problem: Atrial fibrillation           Patient remains in atrial fibrillation.I think his increasing dyspnea on exertion and edema is related to his new rhythm disturbance. He has been on xeralto for  greater than 3 weeks. Continue atenolol. I will proceed with cardioversion as I think sinus rhythm would improve his symptoms. If he does not maintain sinus rhythm and he will most likely need an antiarrhythmic such as amiodarone or tikosyn. Change Lasix to 40 mg daily. Check potassium and renal function in one week.           Revision History       Date/Time User Action    > 07/06/2013 12:21 PM Lewayne Bunting, MD Edit      07/06/2013 12:21 PM Lewayne Bunting, MD Create              AMYLOIDOSIS UNSPECIFIED - Lewayne Bunting, MD at 07/06/2013 12:21 PM    Status: Written Related Problem: AMYLOIDOSIS UNSPECIFIED            Management per hematology oncology.         CAD - Lewayne Bunting, MD at 07/06/2013 12:21 PM    Status: Written Related Problem: CAD           Not on aspirin given need for anticoagulation.         HYPERTENSION - Lewayne Bunting, MD at 07/06/2013 12:22 PM    Status: Written Related Problem: HYPERTENSION           Continue present medications.         PAROXYSMAL SUPRAVENTRICULAR TACHYCARDIA - Lewayne Bunting, MD at 07/06/2013 12:22 PM    Status: Written Related Problem: PAROXYSMAL SUPRAVENTRICULAR TACHYCARDIA           Continue beta blocker.    For DCCV; no changes [name]

## 2013-07-10 NOTE — Preoperative (Signed)
Beta Blockers   Reason not to administer Beta Blockers:Not Applicable 

## 2013-07-10 NOTE — Anesthesia Preprocedure Evaluation (Addendum)
Anesthesia Evaluation  Patient identified by MRN, date of birth, ID band Patient awake    Reviewed: Allergy & Precautions, H&P , NPO status , Patient's Chart, lab work & pertinent test results, reviewed documented beta blocker date and time   Airway Mallampati: II TM Distance: >3 FB Neck ROM: Full    Dental  (+) Dental Advisory Given   Pulmonary neg pulmonary ROS, shortness of breath and with exertion, sleep apnea and Continuous Positive Airway Pressure Ventilation ,  breath sounds clear to auscultation        Cardiovascular hypertension, Pt. on medications and Pt. on home beta blockers + CAD and +CHF + dysrhythmias Atrial Fibrillation Rhythm:Irregular Rate:Normal  Echo 10/2012 - Left ventricle: Abnormal septal motion from ? BBB The   cavity size was normal. Wall thickness was increased in a  pattern of moderate LVH. Systolic function was normal. The  estimated ejection fraction was in the range of 55% to  60%. - Mitral valve: Mild regurgitation. - Left atrium: The atrium was moderately dilated. - Right atrium: The atrium was mildly dilated. - Atrial septum: No defect or patent foramen ovale was   identified. - Pulmonary arteries: PA peak pressure: 53mm Hg (S).    Neuro/Psych negative neurological ROS  negative psych ROS   GI/Hepatic negative GI ROS, Neg liver ROS,   Endo/Other  Morbid obesity  Renal/GU ARFRenal diseasenegative Renal ROS     Musculoskeletal negative musculoskeletal ROS (+)   Abdominal   Peds  Hematology negative hematology ROS (+)   Anesthesia Other Findings   Reproductive/Obstetrics                         Anesthesia Physical Anesthesia Plan  ASA: III  Anesthesia Plan: General   Post-op Pain Management:    Induction: Intravenous  Airway Management Planned: Mask  Additional Equipment:   Intra-op Plan:   Post-operative Plan:   Informed Consent: I have reviewed  the patients History and Physical, chart, labs and discussed the procedure including the risks, benefits and alternatives for the proposed anesthesia with the patient or authorized representative who has indicated his/her understanding and acceptance.   Dental advisory given  Plan Discussed with: CRNA  Anesthesia Plan Comments:         Anesthesia Quick Evaluation

## 2013-07-10 NOTE — Transfer of Care (Signed)
Immediate Anesthesia Transfer of Care Note  Patient: Johnny Mitchell  Procedure(s) Performed: Procedure(s): CARDIOVERSION (N/A)  Patient Location: PACU  Anesthesia Type:General  Level of Consciousness: awake, alert  and oriented  Airway & Oxygen Therapy: Patient connected to face mask oxygen  Post-op Assessment: Report given to PACU RN  Post vital signs: stable  Complications: No apparent anesthesia complications

## 2013-07-11 ENCOUNTER — Telehealth: Payer: Self-pay | Admitting: *Deleted

## 2013-07-11 ENCOUNTER — Encounter: Payer: Self-pay | Admitting: *Deleted

## 2013-07-11 ENCOUNTER — Telehealth: Payer: Self-pay | Admitting: Cardiology

## 2013-07-11 ENCOUNTER — Encounter (HOSPITAL_COMMUNITY): Payer: Self-pay | Admitting: Cardiology

## 2013-07-11 NOTE — Progress Notes (Signed)
Order received from Dr. Ludwig Clarks office for [facility] to be added onto lab work here tomorrow.  Fax results to them at [phone].  Rx given to Chip Boer in the lab.

## 2013-07-11 NOTE — Telephone Encounter (Signed)
Left message, have faxed order to the number provided in previous telephone note.

## 2013-07-11 NOTE — Telephone Encounter (Signed)
New problem   Returning [employer] phone call. Please call concerning labs and diagnosis codes.

## 2013-07-11 NOTE — Telephone Encounter (Signed)
Order faxed to the number provided

## 2013-07-11 NOTE — Telephone Encounter (Signed)
Returned call to Dr. Ludwig Clarks office.  Deliah Goody left VM on 7/28 requesting labs be added to pt's labs here on tomorrow 7/31.  Left Message for office to fax over Rx w/ requested labs, diagnosis codes and fax number to our office at Fax [phone].

## 2013-07-12 ENCOUNTER — Encounter: Payer: Self-pay | Admitting: Cardiology

## 2013-07-12 ENCOUNTER — Ambulatory Visit (HOSPITAL_BASED_OUTPATIENT_CLINIC_OR_DEPARTMENT_OTHER): Payer: Medicare Other

## 2013-07-12 ENCOUNTER — Other Ambulatory Visit (HOSPITAL_BASED_OUTPATIENT_CLINIC_OR_DEPARTMENT_OTHER): Payer: Medicare Other

## 2013-07-12 VITALS — BP 125/76 | HR 66 | Temp 97.1°F

## 2013-07-12 DIAGNOSIS — E859 Amyloidosis, unspecified: Secondary | ICD-10-CM

## 2013-07-12 DIAGNOSIS — N179 Acute kidney failure, unspecified: Secondary | ICD-10-CM | POA: Diagnosis not present

## 2013-07-12 DIAGNOSIS — I509 Heart failure, unspecified: Secondary | ICD-10-CM | POA: Diagnosis not present

## 2013-07-12 DIAGNOSIS — Z5112 Encounter for antineoplastic immunotherapy: Secondary | ICD-10-CM

## 2013-07-12 LAB — CBC WITH DIFFERENTIAL/PLATELET
BASO%: 0.1 % (ref 0.0–2.0)
EOS%: 0.3 % (ref 0.0–7.0)
MCH: 32.7 pg (ref 27.2–33.4)
MCV: 99.4 fL — ABNORMAL HIGH (ref 79.3–98.0)
MONO%: 2.9 % (ref 0.0–14.0)
RBC: 4.01 10*6/uL — ABNORMAL LOW (ref 4.20–5.82)
RDW: 14.6 % (ref 11.0–14.6)

## 2013-07-12 MED ORDER — ONDANSETRON HCL 8 MG PO TABS
8.0000 mg | ORAL_TABLET | Freq: Once | ORAL | Status: AC
  Administered 2013-07-12: 8 mg via ORAL

## 2013-07-12 MED ORDER — BORTEZOMIB CHEMO SQ INJECTION 3.5 MG (2.5MG/ML)
3.1000 mg | Freq: Once | INTRAMUSCULAR | Status: AC
  Administered 2013-07-12: 3 mg via SUBCUTANEOUS
  Filled 2013-07-12: qty 3

## 2013-07-12 NOTE — Patient Instructions (Addendum)
[facility] Discharge Instructions for Patients Receiving Chemotherapy  Today you received the following chemotherapy agents velcade.     If you develop nausea and vomiting , call the clinic.   BELOW ARE SYMPTOMS THAT SHOULD BE REPORTED IMMEDIATELY:  *FEVER GREATER THAN 100.5 F  *CHILLS WITH OR WITHOUT FEVER  NAUSEA AND VOMITING THAT IS NOT CONTROLLED WITH YOUR NAUSEA MEDICATION  *UNUSUAL SHORTNESS OF BREATH  *UNUSUAL BRUISING OR BLEEDING  TENDERNESS IN MOUTH AND THROAT WITH OR WITHOUT PRESENCE OF ULCERS  *URINARY PROBLEMS  *BOWEL PROBLEMS  UNUSUAL RASH Items with * indicate a potential emergency and should be followed up as soon as possible.  Feel free to call the clinic you have any questions or concerns. The clinic phone number is [phone].

## 2013-07-18 ENCOUNTER — Other Ambulatory Visit: Payer: Self-pay

## 2013-07-23 ENCOUNTER — Telehealth: Payer: Self-pay | Admitting: Cardiology

## 2013-07-23 NOTE — Telephone Encounter (Signed)
New Prob  Pt states the medication does not seem to working that he was prescribed for his shortness of breath.

## 2013-07-23 NOTE — Telephone Encounter (Signed)
Spoke with pt, he reports he is unable to do anything withut being SOB. Sitting and lying he is fine. He is unable to tell if he is out of rhythm or not. He will come by the office tomorrow for an EKG to see if he is in fib again.

## 2013-07-24 ENCOUNTER — Ambulatory Visit (INDEPENDENT_AMBULATORY_CARE_PROVIDER_SITE_OTHER): Payer: Medicare Other | Admitting: *Deleted

## 2013-07-24 VITALS — BP 118/64 | HR 50 | Ht 71.0 in | Wt 272.8 lb

## 2013-07-24 DIAGNOSIS — I4891 Unspecified atrial fibrillation: Secondary | ICD-10-CM

## 2013-07-24 NOTE — Progress Notes (Signed)
EKG reviewed by Dr Eden Emms. Dr Eden Emms recommended pt decrease atenolol to 12.5mg  daily. Pt requested to discontinue atenolol altogether. Per Dr Adrian Prince to discontinue atenolol, continue amiodarone 200mg  (2 tablets) daily, schedule appt with PA/NP in 2 weeks.

## 2013-07-24 NOTE — Patient Instructions (Addendum)
Stop atenolol.   Continue amiodarone at your current dose of 400mg  (2 of your 200mg  tablets) daily in the AM.  You have  follow-up appointment with [facility] Wednesday August 27,2014 at 10:30AM.

## 2013-07-24 NOTE — Addendum Note (Signed)
Addended by: Jacqlyn Krauss on: 07/24/2013 11:54 AM   Modules accepted: Orders

## 2013-07-26 ENCOUNTER — Ambulatory Visit (HOSPITAL_BASED_OUTPATIENT_CLINIC_OR_DEPARTMENT_OTHER): Payer: Medicare Other | Admitting: Hematology and Oncology

## 2013-07-26 ENCOUNTER — Ambulatory Visit (HOSPITAL_BASED_OUTPATIENT_CLINIC_OR_DEPARTMENT_OTHER): Payer: Medicare Other

## 2013-07-26 ENCOUNTER — Other Ambulatory Visit (HOSPITAL_BASED_OUTPATIENT_CLINIC_OR_DEPARTMENT_OTHER): Payer: Medicare Other

## 2013-07-26 VITALS — BP 132/71 | HR 65 | Temp 98.3°F | Resp 20 | Ht 71.0 in | Wt 279.5 lb

## 2013-07-26 DIAGNOSIS — D72829 Elevated white blood cell count, unspecified: Secondary | ICD-10-CM | POA: Diagnosis not present

## 2013-07-26 DIAGNOSIS — E859 Amyloidosis, unspecified: Secondary | ICD-10-CM

## 2013-07-26 DIAGNOSIS — R600 Localized edema: Secondary | ICD-10-CM

## 2013-07-26 DIAGNOSIS — R609 Edema, unspecified: Secondary | ICD-10-CM

## 2013-07-26 DIAGNOSIS — Z5112 Encounter for antineoplastic immunotherapy: Secondary | ICD-10-CM | POA: Diagnosis not present

## 2013-07-26 LAB — CBC WITH DIFFERENTIAL/PLATELET
Eosinophils Absolute: 0.1 10*3/uL (ref 0.0–0.5)
MONO#: 0.8 10*3/uL (ref 0.1–0.9)
MONO%: 10.6 % (ref 0.0–14.0)
NEUT#: 5.5 10*3/uL (ref 1.5–6.5)
RBC: 3.77 10*6/uL — ABNORMAL LOW (ref 4.20–5.82)
RDW: 14.6 % (ref 11.0–14.6)
WBC: 7.4 10*3/uL (ref 4.0–10.3)
lymph#: 0.9 10*3/uL (ref 0.9–3.3)

## 2013-07-26 LAB — COMPREHENSIVE METABOLIC PANEL (CC13)
Albumin: 3.1 g/dL — ABNORMAL LOW (ref 3.5–5.0)
Alkaline Phosphatase: 91 U/L (ref 40–150)
CO2: 27 mEq/L (ref 22–29)
Glucose: 131 mg/dl (ref 70–140)
Potassium: 4.1 mEq/L (ref 3.5–5.1)
Sodium: 145 mEq/L (ref 136–145)
Total Protein: 6.3 g/dL — ABNORMAL LOW (ref 6.4–8.3)

## 2013-07-26 MED ORDER — BORTEZOMIB CHEMO SQ INJECTION 3.5 MG (2.5MG/ML)
3.1000 mg | Freq: Once | INTRAMUSCULAR | Status: AC
  Administered 2013-07-26: 3 mg via SUBCUTANEOUS
  Filled 2013-07-26: qty 3

## 2013-07-26 MED ORDER — ONDANSETRON HCL 8 MG PO TABS
8.0000 mg | ORAL_TABLET | Freq: Once | ORAL | Status: AC
  Administered 2013-07-26: 8 mg via ORAL

## 2013-07-26 NOTE — Patient Instructions (Addendum)
[facility] Discharge Instructions for Patients Receiving Chemotherapy  Today you received the following chemotherapy agents :  Velcade.  To help prevent nausea and vomiting after your treatment, we encourage you to take your nausea medication as instructed by your physician.   If you develop nausea and vomiting that is not controlled by your nausea medication, call the clinic.   BELOW ARE SYMPTOMS THAT SHOULD BE REPORTED IMMEDIATELY:  *FEVER GREATER THAN 100.5 F  *CHILLS WITH OR WITHOUT FEVER  NAUSEA AND VOMITING THAT IS NOT CONTROLLED WITH YOUR NAUSEA MEDICATION  *UNUSUAL SHORTNESS OF BREATH  *UNUSUAL BRUISING OR BLEEDING  TENDERNESS IN MOUTH AND THROAT WITH OR WITHOUT PRESENCE OF ULCERS  *URINARY PROBLEMS  *BOWEL PROBLEMS  UNUSUAL RASH Items with * indicate a potential emergency and should be followed up as soon as possible.  Feel free to call the clinic you have any questions or concerns. The clinic phone number is [phone].    

## 2013-07-27 LAB — KAPPA/LAMBDA LIGHT CHAINS: Lambda Free Lght Chn: 1.69 mg/dL (ref 0.57–2.63)

## 2013-07-29 NOTE — Progress Notes (Signed)
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 Negative cytogenetics and FISH myeloma panel. Cardiac MRI showed no sign of cardiac dysfunction.   PAST TREATMENT: Velcade, dexamethasone, melphalan x 3 cycles. 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   CURRENT THERAPY: started salvage Velcade for persistent serum free light chain on 09/17/2010 (3 weeks on, 1 week off). His regimen was modified to include Revlimid 5mg  PO d1-21 every 4 weeks and Dexamethasone 10mg  PO qweek in April 2013 to due slightly elevated light chain. He self-discontinued Revlmid in 04/2013 due to grade 1 fatigue and he thought that "it wasn't doing anything for him."   INTERVAL HISTORY:  Johnny Mitchell 72 y.o. male returns for regular follow up. Patient told that he was found to be  eligible  for clinical trial NCT [id number] which is a phase I study evaluating NEOD 001. He continue to have back pain when he is walking. He was seen by neurosurgeon. CT, MRI were done and all of them were negative. His atrial fibrillation under control with amiodarone. Appetite is good, weight is stable.  Patient reported clear nasal discharge in morning. He has dyspnea on exertion. Easy brushing was reported. The patient denied fever, chills, night sweats, change in appetite or weight. He denied headaches, double vision, blurry vision, nasal congestion,hearing problems, odynophagia or dysphagia. No chest pain, palpitations, cough, abdominal pain, nausea, vomiting, diarrhea, constipation, hematochezia. The patient denied dysuria, nocturia, polyuria, hematuria, myalgia, numbness, tingling, psychiatric  problems.  Review of Systems  Constitutional: Negative for fever, chills, weight loss, malaise/fatigue and diaphoresis.  HENT: Negative for hearing loss, ear pain, nosebleeds, congestion, sore throat, neck pain and tinnitus.   Eyes: Negative for blurred vision, double vision, photophobia and pain.  Respiratory: Positive for shortness of breath. Negative for cough, hemoptysis, sputum production and wheezing.   Cardiovascular: Positive for leg swelling. Negative for chest pain, palpitations, orthopnea, claudication and PND.  Gastrointestinal: Negative for heartburn, nausea, vomiting, abdominal pain, diarrhea, constipation and blood in stool.  Genitourinary: Negative for dysuria, urgency, frequency and hematuria.  Musculoskeletal: Positive for back pain. Negative for myalgias and joint pain.  Skin: Negative for itching and rash.  Neurological: Negative for dizziness, tingling, tremors, sensory change, focal weakness, seizures, loss of consciousness, weakness and headaches.  Endo/Heme/Allergies: Bruises/bleeds easily.  Psychiatric/Behavioral: Negative.    PAST MEDICAL HISTORY: Past Medical History  Diagnosis Date  . Hypertension   . Coronary atherosclerosis of unspecified type of vessel, native or graft     Non obstructive  . Cerebrovascular disease, unspecified   . Pure hypercholesterolemia   . Diverticulosis of colon (without mention of hemorrhage)   . Benign neoplasm of colon   . Unspecified hemorrhoids without mention of complication   . Degenerative disc disease   . 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 Hemangioma   . Amyloidosis   . LBBB (left bundle branch block)   . 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 OSA (obstructive sleep apnea)   . Atrial fibrillation   . Cancer     amyloidosis    PAST SURGICAL HISTORY: Past Surgical History  Procedure Laterality Date  . Knee arthroscopy    . Carpal tunnel release   08/2006    Dr Thad Ranger at [facility] - Bilateral  . 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 Back surgery      Lumbar  . Cardioversion N/A 07/10/2013    Procedure: CARDIOVERSION;  Surgeon: Lewayne Bunting, MD;  Location: [facility] ENDOSCOPY;  Service: Cardiovascular;  Laterality: N/A;    FAMILY HISTORY Family History  Problem Relation Age of Onset  . Cancer Mother     Lung  . Cancer Father     Melanoma   HEALTH MAINTENANCE: History  Substance Use Topics  . Smoking status: Never Smoker   . Smokeless tobacco: Never Used  . Alcohol Use: Yes    No Known Allergies  Current Outpatient Prescriptions  Medication Sig Dispense Refill  . amiodarone (PACERONE) 200 MG tablet 2 tablets (total 400mg ) daily      . bortezomib IV (VELCADE) 3.5 MG injection Inject 1.3 mg/m2 into the vein as directed. Takes every Thursday. Three weeks on, one week off.      . dexamethasone (DECADRON) 2 MG tablet Take 10 mg by mouth every Thursday. On chemo days      . furosemide (LASIX) 40 MG tablet 2 tablets (total 80mg ) daily in the AM      . Rivaroxaban (XARELTO) 20 MG TABS Take 1 tablet (20 mg total) by mouth daily with supper.  30 tablet  11   No current facility-administered medications for this visit.    OBJECTIVE: Filed Vitals:   07/26/13 0828  BP: 132/71  Pulse: 65  Temp: 98.3 F (36.8 C)  Resp: 20     Body mass index is 39 kg/(m^2).    ECOG FS: 1  HEENT: Sclerae anicteric.  Conjunctivae were pink. Pupils round and reactive bilaterally. Oral mucosa is moist without ulceration or thrush. No occipital, submandibular, cervical, supraclavicular or axillar adenopathy.Diminished hearing on left. Lungs: clear to auscultation without wheezes. No rales or rhonchi. Heart: regular rate and rhythm. No murmur, gallop or rubs. Abdomen: soft, non tender. No guarding or rebound tenderness. Bowel sounds are present. No palpable hepatosplenomegaly. MSK: no focal spinal tenderness. Extremities: No clubbing or cyanosis.No calf  tenderness to palpitation. +3 peripheral edema. The patient had grossly intact strength in upper and lower extremities Neuro: non-focal, alert and oriented to time, person and place, appropriate affect  LAB RESULTS:  CMP     Component Value Date/Time   NA 145 07/26/2013 0810   NA 138 06/01/2013 0548   K 4.1 07/26/2013 0810   K 4.1 06/01/2013 0548   CL 100 06/01/2013 0548   CL 103 05/31/2013 0842   CO2 27 07/26/2013 0810   CO2 30 06/01/2013 0548   GLUCOSE 131 07/26/2013 0810   GLUCOSE 126* 06/01/2013 0548   GLUCOSE 118* 05/31/2013 0842   BUN 28.6* 07/26/2013 0810   BUN 30* 06/01/2013 0548   CREATININE 1.4* 07/26/2013 0810   CREATININE 1.47* 06/01/2013 0548   CREATININE 0.92 12/17/2011 1606   CALCIUM 9.0 07/26/2013 0810   CALCIUM 9.1 06/01/2013 0548   PROT 6.3* 07/26/2013 0810   PROT 6.0 08/03/2012 0843   ALBUMIN 3.1* 07/26/2013 0810   ALBUMIN 3.5 08/03/2012 0843   AST 15 07/26/2013 0810   AST 14 08/03/2012 0843   ALT 15 07/26/2013 0810   ALT 18 08/03/2012 0843   ALKPHOS 91 07/26/2013 0810   ALKPHOS 83 08/03/2012 0843   BILITOT 0.64 07/26/2013 0810   BILITOT 0.6  08/03/2012 0843   GFRNONAA 45* 06/01/2013 0548   GFRAA 52* 06/01/2013 0548   Lab Results  Component Value Date   WBC 7.4 07/26/2013   NEUTROABS 5.5 07/26/2013   HGB 12.5* 07/26/2013   HCT 37.3* 07/26/2013   MCV 99.0* 07/26/2013   PLT 207 07/26/2013      Chemistry      Component Value Date/Time   NA 145 07/26/2013 0810   NA 138 06/01/2013 0548   K 4.1 07/26/2013 0810   K 4.1 06/01/2013 0548   CL 100 06/01/2013 0548   CL 103 05/31/2013 0842   CO2 27 07/26/2013 0810   CO2 30 06/01/2013 0548   BUN 28.6* 07/26/2013 0810   BUN 30* 06/01/2013 0548   CREATININE 1.4* 07/26/2013 0810   CREATININE 1.47* 06/01/2013 0548   CREATININE 0.92 12/17/2011 1606      Component Value Date/Time   CALCIUM 9.0 07/26/2013 0810   CALCIUM 9.1 06/01/2013 0548   ALKPHOS 91 07/26/2013 0810   ALKPHOS 83 08/03/2012 0843   AST 15 07/26/2013 0810   AST 14 08/03/2012 0843   ALT  15 07/26/2013 0810   ALT 18 08/03/2012 0843   BILITOT 0.64 07/26/2013 0810   BILITOT 0.6 08/03/2012 0843       ASSESSMENT AND PLAN:  1. History of amyloidosis:  - He has been on maintenance Velcade since 09/2010. Normally, maintenance chemo in myeloma is two years. However, we extrapolated the data from myeloma to use maintenance chemo in amyloidosis. Given the agressiveness of this disease, decision in 08/2012 was to continue for one more year. He  does not have dose limiting toxicity. He was found to be eligible  for clinical trial NCT [id number] which is a phase I study evaluating NEOD 001 in patients s/p BMT for amyloidosis with residual organ involvement. I thick he will be enroll in one month. He would like to continue Velcade/Dex in the meantime.  2. Pedal edema. He is on Lasix.  3. Hypertension. On  Lasix per Dr. Jens Som. 4. A. fib: On amiodarone and Xarelto per Cardiology.  5. Follow up: Velcade SQ (3wks on, 1 wk off). Return visit in about 1 months.   The length of time of the face-to-face encounter was 25 minutes. More than 50% of time was spent counseling and coordination of care.   Cc: Michele Mcalpine, MD  Myra Rude, MD   07/29/2013 7:00 PM

## 2013-07-30 ENCOUNTER — Other Ambulatory Visit: Payer: Self-pay | Admitting: Cardiology

## 2013-07-31 ENCOUNTER — Telehealth: Payer: Self-pay | Admitting: Cardiology

## 2013-07-31 MED ORDER — AMIODARONE HCL 200 MG PO TABS: 200.0000 mg | ORAL_TABLET | Freq: Every day | ORAL | Status: DC

## 2013-07-31 NOTE — Telephone Encounter (Signed)
Error

## 2013-08-02 ENCOUNTER — Other Ambulatory Visit (HOSPITAL_BASED_OUTPATIENT_CLINIC_OR_DEPARTMENT_OTHER): Payer: Medicare Other | Admitting: Lab

## 2013-08-02 ENCOUNTER — Ambulatory Visit (HOSPITAL_BASED_OUTPATIENT_CLINIC_OR_DEPARTMENT_OTHER): Payer: Medicare Other

## 2013-08-02 VITALS — BP 145/88 | HR 83 | Temp 97.5°F

## 2013-08-02 DIAGNOSIS — E859 Amyloidosis, unspecified: Secondary | ICD-10-CM

## 2013-08-02 DIAGNOSIS — Z5112 Encounter for antineoplastic immunotherapy: Secondary | ICD-10-CM

## 2013-08-02 LAB — CBC WITH DIFFERENTIAL/PLATELET
Basophils Absolute: 0 10*3/uL (ref 0.0–0.1)
Eosinophils Absolute: 0.1 10*3/uL (ref 0.0–0.5)
HGB: 13.5 g/dL (ref 13.0–17.1)
MCV: 99.5 fL — ABNORMAL HIGH (ref 79.3–98.0)
MONO#: 0.7 10*3/uL (ref 0.1–0.9)
MONO%: 9.8 % (ref 0.0–14.0)
NEUT#: 5.4 10*3/uL (ref 1.5–6.5)
Platelets: 189 10*3/uL (ref 140–400)
RBC: 4.18 10*6/uL — ABNORMAL LOW (ref 4.20–5.82)
RDW: 14.1 % (ref 11.0–14.6)
WBC: 7.2 10*3/uL (ref 4.0–10.3)
nRBC: 0 % (ref 0–0)

## 2013-08-02 MED ORDER — ONDANSETRON HCL 8 MG PO TABS
8.0000 mg | ORAL_TABLET | Freq: Once | ORAL | Status: AC
  Administered 2013-08-02: 8 mg via ORAL

## 2013-08-02 MED ORDER — BORTEZOMIB CHEMO SQ INJECTION 3.5 MG (2.5MG/ML)
3.1000 mg | Freq: Once | INTRAMUSCULAR | Status: AC
  Administered 2013-08-02: 3 mg via SUBCUTANEOUS
  Filled 2013-08-02: qty 3

## 2013-08-02 NOTE — Patient Instructions (Addendum)
[facility] Discharge Instructions for Patients Receiving Chemotherapy  Today you received the following chemotherapy agents: Velcade. To help prevent nausea and vomiting after your treatment, we encourage you to take your nausea medication.  If you develop nausea and vomiting that is not controlled by your nausea medication, call the clinic.   BELOW ARE SYMPTOMS THAT SHOULD BE REPORTED IMMEDIATELY:  *FEVER GREATER THAN 100.5 F  *CHILLS WITH OR WITHOUT FEVER  NAUSEA AND VOMITING THAT IS NOT CONTROLLED WITH YOUR NAUSEA MEDICATION  *UNUSUAL SHORTNESS OF BREATH  *UNUSUAL BRUISING OR BLEEDING  TENDERNESS IN MOUTH AND THROAT WITH OR WITHOUT PRESENCE OF ULCERS  *URINARY PROBLEMS  *BOWEL PROBLEMS  UNUSUAL RASH Items with * indicate a potential emergency and should be followed up as soon as possible.  Feel free to call the clinic you have any questions or concerns. The clinic phone number is [phone].    

## 2013-08-02 NOTE — Progress Notes (Signed)
Pt reports that he is going to [facility] 09/03/13 & is supposed to be off his velcade for 30 days & therefore states today is his last day for velcade & will not be back 08/09/13 for lab & velcade.  Note to Dr Roanna Raider.

## 2013-08-08 ENCOUNTER — Encounter: Payer: Medicare Other | Admitting: Physician Assistant

## 2013-08-08 NOTE — Progress Notes (Signed)
This encounter was created in error - please disregard.

## 2013-08-09 ENCOUNTER — Other Ambulatory Visit: Payer: Medicare Other | Admitting: Lab

## 2013-08-09 ENCOUNTER — Ambulatory Visit: Payer: Medicare Other

## 2013-08-22 ENCOUNTER — Encounter: Payer: Self-pay | Admitting: Cardiology

## 2013-08-22 ENCOUNTER — Ambulatory Visit (INDEPENDENT_AMBULATORY_CARE_PROVIDER_SITE_OTHER): Payer: Medicare Other | Admitting: Cardiology

## 2013-08-22 VITALS — BP 130/78 | HR 79 | Wt 282.0 lb

## 2013-08-22 DIAGNOSIS — I4891 Unspecified atrial fibrillation: Secondary | ICD-10-CM | POA: Diagnosis not present

## 2013-08-22 DIAGNOSIS — I2510 Atherosclerotic heart disease of native coronary artery without angina pectoris: Secondary | ICD-10-CM

## 2013-08-22 DIAGNOSIS — I509 Heart failure, unspecified: Secondary | ICD-10-CM | POA: Diagnosis not present

## 2013-08-22 LAB — BASIC METABOLIC PANEL WITH GFR
CO2: 31 mEq/L (ref 19–32)
Calcium: 8.7 mg/dL (ref 8.4–10.5)
Chloride: 105 mEq/L (ref 96–112)
Glucose, Bld: 104 mg/dL — ABNORMAL HIGH (ref 70–99)
Sodium: 141 mEq/L (ref 135–145)

## 2013-08-22 NOTE — Patient Instructions (Addendum)
Your physician recommends that you labs today: BMET  Your physician recommends that you continue on your current medications as directed. Please refer to the Current Medication list given to you today.  Your physician recommends that you schedule a follow-up appointment in: 3 months with Dr. Jens Som

## 2013-08-22 NOTE — Progress Notes (Signed)
HPI: Pleasant male for fu of atrial fibrillation. 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 LHC 10/2009: pLAD 205, dLAD 30%, mOM2 70-80% (unchanged compared to previous), pRCA 20%, dRCA 20%. Cardiac MRI in January of 2011 showed an ejection fraction of 57%, mild biatrial enlargement, mild right ventricular enlargement. Cardiac amyloid could not be definitively excluded. Patient was admitted in February of 2012 with a wide complex tachycardia. Seen by EP and felt most likely SVT with aberrancy. Treated transiently with amiodarone but then placed on beta blockade. It was felt that if symptoms recur ablation would be warranted. Echo 10/2012: Abnormal septal motion from ?bundle branch block, moderate LVH, EF 55-60%, mild MR, moderate LAE mild RAE, PASP 53. Admitted with atrial fibrillation 6/14 and treated with anticoagulation and rate control. TSH normal. Had cardioversion July 2014. However atrial fibrillation recurs. He was placed on amiodarone. Since that time, he continues to have dyspnea on exertion but denies orthopnea or PND. He continues to have pedal edema. No chest pain or palpitations. He is scheduled to go to [place] to be evaluated for a possible study for a new medication to treat his amyloid.   Current Outpatient Prescriptions  Medication Sig Dispense Refill  . amiodarone (PACERONE) 200 MG tablet Take 1 tablet (200 mg total) by mouth daily.  30 tablet  4  . furosemide (LASIX) 40 MG tablet 1 1/2 tab po qd      . Rivaroxaban (XARELTO) 20 MG TABS Take 1 tablet (20 mg total) by mouth daily with supper.  30 tablet  11   No current facility-administered medications for this visit.     Past Medical History  Diagnosis Date  . Hypertension   . Coronary atherosclerosis of unspecified type of vessel, native or graft     Non obstructive  . Cerebrovascular disease, unspecified   . Pure hypercholesterolemia   . Diverticulosis of colon  (without mention of hemorrhage)   . Benign neoplasm of colon   . Unspecified hemorrhoids without mention of complication   . Degenerative disc disease   . 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 Hemangioma   . Amyloidosis   . LBBB (left bundle branch block)   . 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 OSA (obstructive sleep apnea)   . Atrial fibrillation   . Cancer     amyloidosis    Past Surgical History  Procedure Laterality Date  . Knee arthroscopy    . Carpal tunnel release  08/2006    Dr Thad Ranger at [facility] - Bilateral  . 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 Back surgery      Lumbar  . Cardioversion N/A 07/10/2013    Procedure: CARDIOVERSION;  Surgeon: Lewayne Bunting, MD;  Location: [facility] ENDOSCOPY;  Service: Cardiovascular;  Laterality: N/A;    History   Social History  . Marital Status: Married    Spouse Name: N/A    Number of Children: 2  . Years of Education: N/A   Occupational History  . PRESIDENT     [employer]   Social History Main Topics  . Smoking status: Never Smoker   . Smokeless tobacco: Never Used  . Alcohol Use: Yes  . Drug Use: No  . Sexual Activity: No   Other Topics Concern  . Not on file   Social History Narrative   Lives with wife.  ROS: no fevers or chills, productive cough, hemoptysis, dysphasia, odynophagia, melena, hematochezia, dysuria, hematuria, rash, seizure activity, orthopnea, PND, pedal edema, claudication. Remaining systems are negative.  Physical Exam: Well-developed well-nourished in no acute distress.  Skin is warm and dry.  HEENT is normal.  Neck is supple.  Chest is clear to auscultation with normal expansion.  Cardiovascular exam is irregular Abdominal exam nontender or distended. No masses palpated. Extremities show 1-2+ edema. neuro grossly intact  ECG atrial fibrillation with left bundle branch block.

## 2013-08-22 NOTE — Assessment & Plan Note (Signed)
Not on aspirin given need for anticoagulation. 

## 2013-08-22 NOTE — Assessment & Plan Note (Signed)
Continue present dose of Lasix.I will review today's electrocardiogram and rhythm strips with EP to see if this is sinus her atrial fibrillation. Certainly atrial fibrillation could be attributing to his CHF.

## 2013-08-22 NOTE — Assessment & Plan Note (Signed)
Continue present blood pressure medications. 

## 2013-08-22 NOTE — Assessment & Plan Note (Addendum)
Continue xeralto. Continue amiodarone. I have reviewed his electrocardiogram and rhythm strips today. It is difficult to know whether he is in atrial fibrillation or sinus with PACs. I will review this with one of my electrophysiology colleagues. If he is in atrial fibrillation then I will arrange for a repeat cardioversion now that he has amiodarone fully loaded. Hopefully this would help his symptoms of dyspnea and edema. Continue Lasix. Check potassium and renal function.   I reviewed her electrocardiograms with Dr. Graciela Husbands. He also finds it difficult to know whether he is in sinus rhythm her atrial fibrillation. There appears to be possible P waves in V2. I have elected to continue amiodarone. He will return to see me in 4-6 weeks. We will recheck an electrocardiogram at that time. If there is still a question it may be beneficial to perform carotid massage we'll get adenosine to see if we can detect P waves.

## 2013-08-28 ENCOUNTER — Ambulatory Visit: Payer: Medicare Other | Admitting: Lab

## 2013-08-28 ENCOUNTER — Telehealth: Payer: Self-pay | Admitting: Hematology and Oncology

## 2013-08-28 NOTE — Telephone Encounter (Signed)
Gave pt appt for lab and MD for tomorrow per Vanessa Barbara

## 2013-08-29 ENCOUNTER — Telehealth: Payer: Self-pay | Admitting: Hematology and Oncology

## 2013-08-29 ENCOUNTER — Other Ambulatory Visit (HOSPITAL_BASED_OUTPATIENT_CLINIC_OR_DEPARTMENT_OTHER): Payer: Medicare Other | Admitting: Lab

## 2013-08-29 ENCOUNTER — Ambulatory Visit (HOSPITAL_BASED_OUTPATIENT_CLINIC_OR_DEPARTMENT_OTHER): Payer: Medicare Other | Admitting: Hematology and Oncology

## 2013-08-29 ENCOUNTER — Encounter: Payer: Self-pay | Admitting: Hematology and Oncology

## 2013-08-29 ENCOUNTER — Ambulatory Visit: Payer: Medicare Other | Admitting: Lab

## 2013-08-29 VITALS — BP 137/80 | HR 81 | Temp 96.8°F | Resp 20 | Ht 71.0 in | Wt 278.6 lb

## 2013-08-29 DIAGNOSIS — R609 Edema, unspecified: Secondary | ICD-10-CM

## 2013-08-29 DIAGNOSIS — I471 Supraventricular tachycardia, unspecified: Secondary | ICD-10-CM

## 2013-08-29 DIAGNOSIS — M545 Low back pain, unspecified: Secondary | ICD-10-CM

## 2013-08-29 DIAGNOSIS — G8929 Other chronic pain: Secondary | ICD-10-CM | POA: Diagnosis not present

## 2013-08-29 DIAGNOSIS — M8580 Other specified disorders of bone density and structure, unspecified site: Secondary | ICD-10-CM

## 2013-08-29 DIAGNOSIS — M899 Disorder of bone, unspecified: Secondary | ICD-10-CM | POA: Diagnosis not present

## 2013-08-29 DIAGNOSIS — E859 Amyloidosis, unspecified: Secondary | ICD-10-CM | POA: Diagnosis not present

## 2013-08-29 DIAGNOSIS — M549 Dorsalgia, unspecified: Secondary | ICD-10-CM

## 2013-08-29 DIAGNOSIS — R0602 Shortness of breath: Secondary | ICD-10-CM

## 2013-08-29 DIAGNOSIS — M7989 Other specified soft tissue disorders: Secondary | ICD-10-CM

## 2013-08-29 HISTORY — DX: Edema, unspecified: R60.9

## 2013-08-29 LAB — COMPREHENSIVE METABOLIC PANEL (CC13)
ALT: 15 U/L (ref 0–55)
AST: 17 U/L (ref 5–34)
Albumin: 3.3 g/dL — ABNORMAL LOW (ref 3.5–5.0)
CO2: 29 mEq/L (ref 22–29)
Calcium: 9.2 mg/dL (ref 8.4–10.4)
Chloride: 105 mEq/L (ref 98–109)
Creatinine: 1.2 mg/dL (ref 0.7–1.3)
Potassium: 4 mEq/L (ref 3.5–5.1)

## 2013-08-29 LAB — CBC WITH DIFFERENTIAL/PLATELET
BASO%: 0.4 % (ref 0.0–2.0)
EOS%: 0.4 % (ref 0.0–7.0)
Eosinophils Absolute: 0 10*3/uL (ref 0.0–0.5)
LYMPH%: 14.3 % (ref 14.0–49.0)
MCH: 32.2 pg (ref 27.2–33.4)
MCHC: 32.8 g/dL (ref 32.0–36.0)
MCV: 98.4 fL — ABNORMAL HIGH (ref 79.3–98.0)
MONO%: 15 % — ABNORMAL HIGH (ref 0.0–14.0)
Platelets: 201 10*3/uL (ref 140–400)
RBC: 4.28 10*6/uL (ref 4.20–5.82)
RDW: 13.7 % (ref 11.0–14.6)

## 2013-08-29 NOTE — Telephone Encounter (Signed)
Gave pt appt for  Md in October and pt sent to labs today

## 2013-08-29 NOTE — Progress Notes (Signed)
[facility] OFFICE PROGRESS NOTE  Johnny Mcalpine, MD [address] [place] [address] No chief complaint on file.   DIAGNOSIS: Systemic amyloidosis.  SUMMARY OF HEMATOLOGIC HISTORY: I have reviewed the patient's chart extensively collaborated the history with the patient. He was diagnosed with systemic amyloidosis after presentation with shortness of breath. He has significant evaluation including bone marrow aspirate and biopsy. Bone marrow biopsy, from a couple like a restrictive disease. 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 The patient subsequently went for maintain dense chemotherapy with Velcade for almost 2 years and then switch to Revlimid. He self discontinue Revlimid in May of 2014 due to fatigue.  INTERVAL HISTORY: Johnny Mitchell 72 y.o. male returns for followup on systemic amyloidosis. Most recently, he complained of progressive shortness of breath and leg swelling. He also had recent arrhythmia and was placed on as Xarelto and amiodarone. The patient is interested for a second opinion, and subsequently had an appointment made to go to [facility] in [place] next week for possible involvement of clinical trial. He has some mild easy bruising but no bleeding. He denies any lightheadedness or dizziness. Denies any chest pain. No change in his appetite or bowel habits. With his prior chemotherapy he has some mild peripheral neuropathy but that has subsequently resolved. His other major complaint is chronic back pain that comes and goes. There is no radiation associated with the back pain. The back pain does not limit his activities of daily living.  I have reviewed the past medical history, past surgical history, social history and family history with the patient and they are unchanged from previous  note.  ALLERGIES:  has No Known Allergies.  MEDICATIONS: has a current medication list which includes the following prescription(s): acyclovir, amiodarone, furosemide, and rivaroxaban.   REVIEW OF SYSTEMS:   Constitutional: Denies fevers, chills or abnormal weight loss Eyes: Denies blurriness of vision Ears, nose, mouth, throat, and face: Denies mucositis or sore throat Respiratory: Denies cough, dyspnea or wheezes Cardiovascular: Denies palpitation, chest discomfort but has significant bilateral lower extremity swelling Gastrointestinal:  Denies nausea, heartburn or change in bowel habits Skin: Denies abnormal skin rashes Lymphatics: Denies new lymphadenopathy but he has mild easy bruising Neurological:Denies numbness, tingling or new weaknesses Behavioral/Psych: Mood is stable, no new changes  All other systems were reviewed with the patient and are negative.  PHYSICAL EXAMINATION: ECOG PERFORMANCE STATUS: 1 - Symptomatic but completely ambulatory  Filed Vitals:   08/29/13 1512  BP: 137/80  Pulse: 81  Temp: 96.8 F (36 C)  Resp: 20    GENERAL:alert, no distress and comfortable SKIN: skin color, texture, turgor are normal, no rashes or significant lesions EYES: normal, Conjunctiva are pink and non-injected, sclera clear OROPHARYNX:no exudate, no erythema and lips, buccal mucosa, and tongue normal  NECK: supple, thyroid normal size, non-tender, without nodularity LYMPH:  no palpable lymphadenopathy in the cervical, axillary or inguinal LUNGS: clear to auscultation and percussion with normal breathing effort HEART: irregular rate & rhythm and no murmurs and there is presence of bilateral lower extremity edema ABDOMEN:abdomen soft, non-tender and normal bowel sounds Musculoskeletal:no cyanosis of digits and no clubbing  NEURO: alert & oriented x 3 with fluent speech, no focal motor/sensory deficits  LABORATORY DATA:  I have reviewed the data as listed. I have reviewed his  most recent blood work.  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 We will continue supportive care. I will continue to see him on a monthly basis with history physical examination and blood work. I will repeat some of his blood work and his light chain levels. In regards to his back pain, I'm wondering whether he might have osteopenia. I would check his vitamin D at the level do recommend you take calcium and vitamin D supplement. In regards to his arrhythmia and shortness of breath is not clear to me whether this is related to involvement of amyloidosis. I recommend he continue his medications as prescribed and will monitor his cardiac function carefully. I spent an extensive amount of time counseling the patient today. All questions were answered. The patient knows to call the clinic with any problems, questions or concerns. We can certainly see the patient much sooner if necessary. No barriers to learning was detected.  The patient and plan discussed with [facility], Johnny Mitchell  and he is in agreement with the aforementioned.  I spent 40 minutes counseling the patient face to face. The total time spent in the appointment was 60 minutes and more than 50% was on counseling.     Dilana Mcphie, MD 08/29/2013 4:20 PM

## 2013-08-30 ENCOUNTER — Encounter: Payer: Self-pay | Admitting: Hematology and Oncology

## 2013-08-31 LAB — SPEP & IFE WITH QIG
Gamma Globulin: 11.9 % (ref 11.1–18.8)
IgA: 133 mg/dL (ref 68–379)
IgG (Immunoglobin G), Serum: 775 mg/dL (ref 650–1600)
IgM, Serum: 27 mg/dL — ABNORMAL LOW (ref 41–251)
Total Protein, Serum Electrophoresis: 6.3 g/dL (ref 6.0–8.3)

## 2013-08-31 LAB — BETA 2 MICROGLOBULIN, SERUM: Beta-2 Microglobulin: 2.9 mg/L — ABNORMAL HIGH (ref 1.01–1.73)

## 2013-08-31 LAB — KAPPA/LAMBDA LIGHT CHAINS: Kappa:Lambda Ratio: 0.64 (ref 0.26–1.65)

## 2013-08-31 LAB — SEDIMENTATION RATE: Sed Rate: 16 mm/hr (ref 0–16)

## 2013-08-31 LAB — T4, FREE: Free T4: 1.24 ng/dL (ref 0.80–1.80)

## 2013-09-03 DIAGNOSIS — R609 Edema, unspecified: Secondary | ICD-10-CM | POA: Diagnosis not present

## 2013-09-03 DIAGNOSIS — E785 Hyperlipidemia, unspecified: Secondary | ICD-10-CM | POA: Diagnosis not present

## 2013-09-03 DIAGNOSIS — I4891 Unspecified atrial fibrillation: Secondary | ICD-10-CM | POA: Diagnosis not present

## 2013-09-03 DIAGNOSIS — Z049 Encounter for examination and observation for unspecified reason: Secondary | ICD-10-CM | POA: Diagnosis not present

## 2013-09-03 DIAGNOSIS — R0609 Other forms of dyspnea: Secondary | ICD-10-CM | POA: Diagnosis not present

## 2013-09-03 DIAGNOSIS — Z801 Family history of malignant neoplasm of trachea, bronchus and lung: Secondary | ICD-10-CM | POA: Diagnosis not present

## 2013-09-03 DIAGNOSIS — E859 Amyloidosis, unspecified: Secondary | ICD-10-CM | POA: Diagnosis not present

## 2013-09-03 DIAGNOSIS — D214 Benign neoplasm of connective and other soft tissue of abdomen: Secondary | ICD-10-CM | POA: Diagnosis not present

## 2013-09-03 DIAGNOSIS — I10 Essential (primary) hypertension: Secondary | ICD-10-CM | POA: Diagnosis not present

## 2013-09-03 DIAGNOSIS — Z79899 Other long term (current) drug therapy: Secondary | ICD-10-CM | POA: Diagnosis not present

## 2013-09-03 DIAGNOSIS — C9030 Solitary plasmacytoma not having achieved remission: Secondary | ICD-10-CM | POA: Diagnosis not present

## 2013-09-03 DIAGNOSIS — C9000 Multiple myeloma not having achieved remission: Secondary | ICD-10-CM | POA: Diagnosis not present

## 2013-09-03 DIAGNOSIS — E8589 Other amyloidosis: Secondary | ICD-10-CM | POA: Diagnosis not present

## 2013-09-03 DIAGNOSIS — Z808 Family history of malignant neoplasm of other organs or systems: Secondary | ICD-10-CM | POA: Diagnosis not present

## 2013-09-03 DIAGNOSIS — Z1289 Encounter for screening for malignant neoplasm of other sites: Secondary | ICD-10-CM | POA: Diagnosis not present

## 2013-09-04 DIAGNOSIS — I428 Other cardiomyopathies: Secondary | ICD-10-CM | POA: Diagnosis not present

## 2013-09-04 DIAGNOSIS — I517 Cardiomegaly: Secondary | ICD-10-CM | POA: Diagnosis not present

## 2013-09-04 DIAGNOSIS — R9439 Abnormal result of other cardiovascular function study: Secondary | ICD-10-CM | POA: Diagnosis not present

## 2013-09-05 DIAGNOSIS — C9030 Solitary plasmacytoma not having achieved remission: Secondary | ICD-10-CM | POA: Diagnosis not present

## 2013-09-05 DIAGNOSIS — Z1289 Encounter for screening for malignant neoplasm of other sites: Secondary | ICD-10-CM | POA: Diagnosis not present

## 2013-09-13 ENCOUNTER — Ambulatory Visit: Payer: Medicare Other

## 2013-09-26 ENCOUNTER — Ambulatory Visit (INDEPENDENT_AMBULATORY_CARE_PROVIDER_SITE_OTHER): Payer: Medicare Other | Admitting: Cardiology

## 2013-09-26 ENCOUNTER — Encounter: Payer: Self-pay | Admitting: Cardiology

## 2013-09-26 VITALS — BP 150/80 | HR 73 | Wt 276.0 lb

## 2013-09-26 DIAGNOSIS — I509 Heart failure, unspecified: Secondary | ICD-10-CM | POA: Diagnosis not present

## 2013-09-26 DIAGNOSIS — I10 Essential (primary) hypertension: Secondary | ICD-10-CM | POA: Diagnosis not present

## 2013-09-26 DIAGNOSIS — E859 Amyloidosis, unspecified: Secondary | ICD-10-CM | POA: Diagnosis not present

## 2013-09-26 DIAGNOSIS — I2510 Atherosclerotic heart disease of native coronary artery without angina pectoris: Secondary | ICD-10-CM

## 2013-09-26 DIAGNOSIS — I4891 Unspecified atrial fibrillation: Secondary | ICD-10-CM | POA: Diagnosis not present

## 2013-09-26 NOTE — Assessment & Plan Note (Signed)
Patient's volume status has improved now that sinuses reestablished. Continue present dose of Lasix.

## 2013-09-26 NOTE — Assessment & Plan Note (Signed)
Management per oncology. 

## 2013-09-26 NOTE — Patient Instructions (Signed)
Your physician wants you to follow-up in: 3 MONTHS WITH DR CRENSHAW You will receive a reminder letter in the mail two months in advance. If you don't receive a letter, please call our office to schedule the follow-up appointment.  

## 2013-09-26 NOTE — Assessment & Plan Note (Signed)
Not on aspirin given need for anticoagulation. 

## 2013-09-26 NOTE — Progress Notes (Signed)
HYQ:[id number] male for fu of atrial fibrillation. 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 LHC 10/2009: pLAD 205, dLAD 30%, mOM2 70-80% (unchanged compared to previous), pRCA 20%, dRCA 20%. Cardiac MRI in January of 2011 showed an ejection fraction of 57%, mild biatrial enlargement, mild right ventricular enlargement. Cardiac amyloid could not be definitively excluded. Patient was admitted in February of 2012 with a wide complex tachycardia. Seen by EP and felt most likely SVT with aberrancy. Treated transiently with amiodarone but then placed on beta blockade. It was felt that if symptoms recur ablation would be warranted. Echo 10/2012: Abnormal septal motion from ?bundle branch block, moderate LVH, EF 55-60%, mild MR, moderate LAE mild RAE, PASP 53. Admitted with atrial fibrillation 6/14 and treated with anticoagulation and rate control. TSH normal. Had cardioversion July 2014. However atrial fibrillation recured. He was placed on amiodarone. Since that time, his dyspnea on exertion has improved. No orthopnea or PND. His pedal edema has improved. No chest pain. He was seen in [place] for his amyloid and was told it is in remission.    Current Outpatient Prescriptions  Medication Sig Dispense Refill  . acyclovir (ZOVIRAX) 400 MG tablet Take 400 mg by mouth daily.      Marland Kitchen amiodarone (PACERONE) 200 MG tablet Take 1 tablet (200 mg total) by mouth daily.  30 tablet  4  . furosemide (LASIX) 40 MG tablet 1 1/2 tab po qd      . Rivaroxaban (XARELTO) 20 MG TABS Take 1 tablet (20 mg total) by mouth daily with supper.  30 tablet  11   No current facility-administered medications for this visit.     Past Medical History  Diagnosis Date  . Hypertension   . Coronary atherosclerosis of unspecified type of vessel, native or graft     Non obstructive  . Cerebrovascular disease, unspecified   . Pure hypercholesterolemia   . Diverticulosis of colon  (without mention of hemorrhage)   . Benign neoplasm of colon   . Unspecified hemorrhoids without mention of complication   . Degenerative disc disease   . 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 Hemangioma   . Amyloidosis   . LBBB (left bundle branch block)   . 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 OSA (obstructive sleep apnea)   . Atrial fibrillation   . Cancer     amyloidosis  . Edema 08/29/2013    Past Surgical History  Procedure Laterality Date  . Knee arthroscopy    . Carpal tunnel release  08/2006    Dr Thad Ranger at [facility] - Bilateral  . 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 Back surgery      Lumbar  . Cardioversion N/A 07/10/2013    Procedure: CARDIOVERSION;  Surgeon: Lewayne Bunting, MD;  Location: [facility] ENDOSCOPY;  Service: Cardiovascular;  Laterality: N/A;    History   Social History  . Marital Status: Married    Spouse Name: N/A    Number of Children: 2  . Years of Education: N/A   Occupational History  . PRESIDENT     [employer]   Social History Main Topics  . Smoking status: Never Smoker   . Smokeless tobacco: Never Used  . Alcohol Use: Yes  . Drug Use: No  . Sexual Activity: No   Other Topics Concern  . Not on file  Social History Narrative   Lives with wife.      ROS: no fevers or chills, productive cough, hemoptysis, dysphasia, odynophagia, melena, hematochezia, dysuria, hematuria, rash, seizure activity, orthopnea, PND, pedal edema, claudication. Remaining systems are negative.  Physical Exam: Well-developed well-nourished in no acute distress.  Skin is warm and dry.  HEENT is normal.  Neck is supple.  Chest is clear to auscultation with normal expansion.  Cardiovascular exam is regular rate and rhythm.  Abdominal exam nontender or distended. No masses palpated. Extremities show 1+ edema. neuro grossly intact  ECG sinus rhythm with left bundle branch block.

## 2013-09-26 NOTE — Assessment & Plan Note (Signed)
Continue present medications. 

## 2013-09-26 NOTE — Assessment & Plan Note (Signed)
Patient remains in sinus rhythm today. Continue amiodarone. In 3 months I will check TSH, chest x-ray and liver functions. Continue xeralto.

## 2013-09-28 ENCOUNTER — Ambulatory Visit: Payer: Medicare Other | Admitting: Cardiology

## 2013-09-28 ENCOUNTER — Encounter: Payer: Self-pay | Admitting: Hematology and Oncology

## 2013-09-28 ENCOUNTER — Ambulatory Visit (HOSPITAL_BASED_OUTPATIENT_CLINIC_OR_DEPARTMENT_OTHER): Payer: Medicare Other | Admitting: Hematology and Oncology

## 2013-09-28 VITALS — BP 150/84 | HR 73 | Temp 96.9°F | Resp 18 | Ht 71.0 in | Wt 280.4 lb

## 2013-09-28 DIAGNOSIS — E859 Amyloidosis, unspecified: Secondary | ICD-10-CM | POA: Diagnosis not present

## 2013-09-28 DIAGNOSIS — I471 Supraventricular tachycardia: Secondary | ICD-10-CM

## 2013-09-28 DIAGNOSIS — M545 Low back pain: Secondary | ICD-10-CM

## 2013-09-28 DIAGNOSIS — R609 Edema, unspecified: Secondary | ICD-10-CM

## 2013-09-28 MED ORDER — ACYCLOVIR 400 MG PO TABS: 400.0000 mg | ORAL_TABLET | Freq: Every day | ORAL | Status: DC

## 2013-09-28 MED ORDER — FUROSEMIDE 40 MG PO TABS: 40.0000 mg | ORAL_TABLET | Freq: Two times a day (BID) | ORAL | Status: DC

## 2013-09-28 NOTE — Progress Notes (Signed)
[facility] OFFICE PROGRESS NOTE  Patient Care Team: Michele Mcalpine, MD as PCP - General (Pulmonary Disease) Lewayne Bunting, MD (Cardiology) Eddie Candle as Consulting Physician (Internal Medicine)  DIAGNOSIS: Systemic amyloidosis  SUMMARY OF ONCOLOGIC HISTORY: I have reviewed the patient's chart extensively collaborated the history with the patient. He was diagnosed with systemic amyloidosis after presentation with shortness of breath. He has significant evaluation including bone marrow aspirate and biopsy. Bone marrow biopsy, from a couple like a restrictive disease. 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 The patient subsequently went for maintain dense chemotherapy with Velcade for almost 2 years and then switch to Revlimid. He self discontinue Revlimid in May of 2014 due to fatigue.  INTERVAL HISTORY: Johnny Mitchell 72 y.o. male returns for further followup. He went to [facility] in [place] recently for second opinion and possible enrollment in clinical trial. He is feeling well.He denies any recent fever, chills, night sweats or abnormal weight loss. He still has persistent bilateral lower extremity edema. Denies any recent infection. No recent bleeding such as epistaxis, hematuria, or hematochezia.  I have reviewed the past medical history, past surgical history, social history and family history with the patient and they are unchanged from previous note.  ALLERGIES:  has No Known Allergies.  MEDICATIONS:  Current Outpatient Prescriptions  Medication Sig Dispense Refill  . acyclovir (ZOVIRAX) 400 MG tablet Take 1 tablet (400 mg total) by mouth daily.  90 tablet  3  . amiodarone (PACERONE) 200 MG tablet Take 1 tablet (200 mg total) by mouth daily.  30 tablet  4  . furosemide  (LASIX) 40 MG tablet Take 1 tablet (40 mg total) by mouth 2 (two) times daily.  180 tablet  3  . Rivaroxaban (XARELTO) 20 MG TABS Take 1 tablet (20 mg total) by mouth daily with supper.  30 tablet  11   No current facility-administered medications for this visit.    REVIEW OF SYSTEMS:   Constitutional: Denies fevers, chills or abnormal weight loss All other systems were reviewed with the patient and are negative.  PHYSICAL EXAMINATION: ECOG PERFORMANCE STATUS: 0 - Asymptomatic  Filed Vitals:   09/28/13 1332  BP: 150/84  Pulse: 73  Temp: 96.9 F (36.1 C)  Resp: 18   Filed Weights   09/28/13 1332  Weight: 280 lb 6.4 oz (127.189 kg)    GENERAL:alert, no distress and comfortable NEURO: alert & oriented x 3 with fluent speech, no focal motor/sensory deficits Bilateral lower extremity edema was noted  LABORATORY DATA:  I have reviewed the data as listed    Component Value Date/Time   NA 142 08/29/2013 1611   NA 141 08/22/2013 1032   K 4.0 08/29/2013 1611   K 4.1 08/22/2013 1032   CL 105 08/22/2013 1032   CL 103 05/31/2013 0842   CO2 29 08/29/2013 1611   CO2 31 08/22/2013 1032   GLUCOSE 90 08/29/2013 1611   GLUCOSE 104* 08/22/2013 1032   GLUCOSE 118* 05/31/2013 0842   BUN 21.5 08/29/2013 1611   BUN 17 08/22/2013 1032   CREATININE 1.2 08/29/2013 1611   CREATININE 1.13 08/22/2013 1032   CREATININE 1.47* 06/01/2013 0548   CREATININE 0.92 12/17/2011 1606   CALCIUM 9.2 08/29/2013 1611   CALCIUM 8.7 08/22/2013 1032   PROT 6.5 08/29/2013 1611   PROT 6.0 08/03/2012  0843   ALBUMIN 3.3* 08/29/2013 1611   ALBUMIN 3.5 08/03/2012 0843   AST 17 08/29/2013 1611   AST 14 08/03/2012 0843   ALT 15 08/29/2013 1611   ALT 18 08/03/2012 0843   ALKPHOS 93 08/29/2013 1611   ALKPHOS 83 08/03/2012 0843   BILITOT 0.66 08/29/2013 1611   BILITOT 0.6 08/03/2012 0843   GFRNONAA 45* 06/01/2013 0548   GFRAA 52* 06/01/2013 0548    No results found for this basename: SPEP,  UPEP,   kappa and lambda light chains    Lab  Results  Component Value Date   WBC 6.7 08/29/2013   NEUTROABS 4.7 08/29/2013   HGB 13.8 08/29/2013   HCT 42.1 08/29/2013   MCV 98.4* 08/29/2013   PLT 201 08/29/2013      Chemistry      Component Value Date/Time   NA 142 08/29/2013 1611   NA 141 08/22/2013 1032   K 4.0 08/29/2013 1611   K 4.1 08/22/2013 1032   CL 105 08/22/2013 1032   CL 103 05/31/2013 0842   CO2 29 08/29/2013 1611   CO2 31 08/22/2013 1032   BUN 21.5 08/29/2013 1611   BUN 17 08/22/2013 1032   CREATININE 1.2 08/29/2013 1611   CREATININE 1.13 08/22/2013 1032   CREATININE 1.47* 06/01/2013 0548   CREATININE 0.92 12/17/2011 1606      Component Value Date/Time   CALCIUM 9.2 08/29/2013 1611   CALCIUM 8.7 08/22/2013 1032   ALKPHOS 93 08/29/2013 1611   ALKPHOS 83 08/03/2012 0843   AST 17 08/29/2013 1611   AST 14 08/03/2012 0843   ALT 15 08/29/2013 1611   ALT 18 08/03/2012 0843   BILITOT 0.66 08/29/2013 1611   BILITOT 0.6 08/03/2012 0843     I reviewed the consult note from [place].  RADIOGRAPHIC STUDIES: I have personally reviewed the radiological images as listed and agreed with the findings in the report. No results found.  ASSESSMENT:  Systemic amyloidosis  PLAN:  #1 systemic amyloidosis He is doing very well. All of his scans and assessment showed he has no evidence of disease progression. The patient is actually not offered a clinical trial because is doing so well. We will continue supportive care. I will see him back in 3 months with blood work and urine test #2 history of cardiac arrhythmia He continue on amiodarone and anticoagulation therapy. He has no bleeding complication from that. #3 antimicrobial prophylaxis Due to long-term treatment with Velcade, I recommend he continue on acyclovir. I refilled his prescription. #4 preventive care He received influenza vaccination recently. I recommend him to take vitamin D supplements.  Orders Placed This Encounter  Procedures  . IFE, Urine (with Tot Prot)    Standing  Status: Future     Number of Occurrences:      Standing Expiration Date: 09/28/2014  . IgG, IgA, IgM    Standing Status: Future     Number of Occurrences:      Standing Expiration Date: 09/28/2014  . Kappa/lambda light chains    Standing Status: Future     Number of Occurrences:      Standing Expiration Date: 09/28/2014  . SPEP & IFE with QIG    Standing Status: Future     Number of Occurrences:      Standing Expiration Date: 09/28/2014  . Protein Electro, 24-Hour Urine    Standing Status: Future     Number of Occurrences:      Standing Expiration Date: 09/28/2014  .  Protein electrophoresis, serum    Standing Status: Future     Number of Occurrences:      Standing Expiration Date: 09/28/2014  . Immunofixation electrophoresis    Standing Status: Future     Number of Occurrences:      Standing Expiration Date: 09/28/2014  . CBC with Differential    Standing Status: Future     Number of Occurrences:      Standing Expiration Date: 06/20/2014  . Comprehensive metabolic panel    Standing Status: Future     Number of Occurrences:      Standing Expiration Date: 09/28/2014  . Uric Acid    Standing Status: Future     Number of Occurrences:      Standing Expiration Date: 09/28/2014  . Lactate dehydrogenase    Standing Status: Future     Number of Occurrences:      Standing Expiration Date: 09/28/2014  . Beta 2 microglobuline, serum    Standing Status: Future     Number of Occurrences:      Standing Expiration Date: 09/28/2014   All questions were answered. The patient knows to call the clinic with any problems, questions or concerns. No barriers to learning was detected. I spent 15 minutes counseling the patient face to face. The total time spent in the appointment was 20 minutes and more than 50% was on counseling and review of test results     [facility] [place], Keilan Nichol, MD 09/28/2013 1:51 PM

## 2013-10-01 ENCOUNTER — Telehealth: Payer: Self-pay | Admitting: Hematology and Oncology

## 2013-10-01 ENCOUNTER — Ambulatory Visit: Payer: Medicare Other | Admitting: Cardiology

## 2013-10-01 NOTE — Telephone Encounter (Signed)
s.w. pt and advised on Jan 2015 appts...pt ok and aware

## 2013-10-09 DIAGNOSIS — Z23 Encounter for immunization: Secondary | ICD-10-CM | POA: Diagnosis not present

## 2013-10-16 ENCOUNTER — Telehealth: Payer: Self-pay | Admitting: Cardiology

## 2013-10-16 NOTE — Telephone Encounter (Signed)
New Problem:  Pt states he believes he is having a side effect to one of his medications. Pt states he is experiencing a lot of weakness in his legs. Pt would like to discuss this further with his nurse or doctor. Please advise

## 2013-10-16 NOTE — Telephone Encounter (Signed)
Spoke with pt, he is having weakness in his legs. He reports they are aching and hurting. His weight is stable and he has no swelling. meds confirmed. Will discuss with dr Jens Som

## 2013-10-16 NOTE — Telephone Encounter (Signed)
Discussed with dr Jens Som, Spoke with Johnny Mitchell, aware he will need to cont the present meds. Johnny Mitchell thinks his symptoms are related to the amiodarone and would like to stop it. He is aware he could go into atrial fib again. Patient voiced understanding, Follow up scheduled

## 2013-10-18 ENCOUNTER — Other Ambulatory Visit: Payer: Self-pay

## 2013-10-25 ENCOUNTER — Ambulatory Visit: Payer: Medicare Other | Admitting: Cardiology

## 2013-11-14 ENCOUNTER — Encounter: Payer: Self-pay | Admitting: Cardiology

## 2013-11-14 ENCOUNTER — Ambulatory Visit (INDEPENDENT_AMBULATORY_CARE_PROVIDER_SITE_OTHER): Payer: Medicare Other | Admitting: Cardiology

## 2013-11-14 VITALS — BP 140/80 | HR 76 | Ht 70.0 in | Wt 274.0 lb

## 2013-11-14 DIAGNOSIS — I2510 Atherosclerotic heart disease of native coronary artery without angina pectoris: Secondary | ICD-10-CM | POA: Diagnosis not present

## 2013-11-14 DIAGNOSIS — E859 Amyloidosis, unspecified: Secondary | ICD-10-CM | POA: Diagnosis not present

## 2013-11-14 DIAGNOSIS — I10 Essential (primary) hypertension: Secondary | ICD-10-CM

## 2013-11-14 DIAGNOSIS — I4891 Unspecified atrial fibrillation: Secondary | ICD-10-CM | POA: Diagnosis not present

## 2013-11-14 DIAGNOSIS — I471 Supraventricular tachycardia: Secondary | ICD-10-CM

## 2013-11-14 DIAGNOSIS — I509 Heart failure, unspecified: Secondary | ICD-10-CM | POA: Diagnosis not present

## 2013-11-14 DIAGNOSIS — E7800 Pure hypercholesterolemia, unspecified: Secondary | ICD-10-CM

## 2013-11-14 NOTE — Assessment & Plan Note (Signed)
Patient remains in sinus rhythm.

## 2013-11-14 NOTE — Assessment & Plan Note (Signed)
Followed by oncology 

## 2013-11-14 NOTE — Assessment & Plan Note (Signed)
Blood pressure controlled. Continue present dose of Lasix.

## 2013-11-14 NOTE — Assessment & Plan Note (Signed)
Patient remains in sinus rhythm. He discontinued his amiodarone because of leg weakness. He understands that the risk of atrial fibrillation recurring is higher. Hopefully he will maintain sinus rhythm. Continue xeralto.

## 2013-11-14 NOTE — Assessment & Plan Note (Signed)
Continue diet.

## 2013-11-14 NOTE — Progress Notes (Signed)
HPI: Pleasant male for fu of atrial fibrillation. 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 LHC 10/2009: pLAD 205, dLAD 30%, mOM2 70-80% (unchanged compared to previous), pRCA 20%, dRCA 20%. Cardiac MRI in January of 2011 showed an ejection fraction of 57%, mild biatrial enlargement, mild right ventricular enlargement. Cardiac amyloid could not be definitively excluded. Patient was admitted in February of 2012 with a wide complex tachycardia. Seen by EP and felt most likely SVT with aberrancy. Treated transiently with amiodarone but then placed on beta blockade. It was felt that if symptoms recur ablation would be warranted. Echo 10/2012: Abnormal septal motion from ?bundle branch block, moderate LVH, EF 55-60%, mild MR, moderate LAE mild RAE, PASP 53. Admitted with atrial fibrillation 6/14 and treated with anticoagulation and rate control. TSH normal. Had cardioversion July 2014. However atrial fibrillation recurred. He was placed on amiodarone. He converted to sinus rhythm. Last seen in Oct 2014. He contacted the office in November and wanted to discontinue his amiodarone because of leg weakness. He has dyspnea with more extreme activities but not routine activities. No chest pain. Pedal edema unchanged.   Current Outpatient Prescriptions  Medication Sig Dispense Refill  . acyclovir (ZOVIRAX) 400 MG tablet Take 1 tablet (400 mg total) by mouth daily.  90 tablet  3  . furosemide (LASIX) 40 MG tablet Take 1 tablet (40 mg total) by mouth 2 (two) times daily.  180 tablet  3  . Rivaroxaban (XARELTO) 20 MG TABS Take 1 tablet (20 mg total) by mouth daily with supper.  30 tablet  11   No current facility-administered medications for this visit.     Past Medical History  Diagnosis Date  . Hypertension   . Coronary atherosclerosis of unspecified type of vessel, native or graft     Non obstructive  . Cerebrovascular disease, unspecified   .  Pure hypercholesterolemia   . Diverticulosis of colon (without mention of hemorrhage)   . Benign neoplasm of colon   . Unspecified hemorrhoids without mention of complication   . Degenerative disc disease   . 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 Hemangioma   . Amyloidosis   . LBBB (left bundle branch block)   . 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 OSA (obstructive sleep apnea)   . Atrial fibrillation   . Cancer     amyloidosis  . Edema 08/29/2013    Past Surgical History  Procedure Laterality Date  . Knee arthroscopy    . Carpal tunnel release  08/2006    Dr Thad Ranger at [facility] - Bilateral  . 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 Back surgery      Lumbar  . Cardioversion N/A 07/10/2013    Procedure: CARDIOVERSION;  Surgeon: Lewayne Bunting, MD;  Location: [facility] ENDOSCOPY;  Service: Cardiovascular;  Laterality: N/A;    History   Social History  . Marital Status: Married    Spouse Name: N/A    Number of Children: 2  . Years of Education: N/A   Occupational History  . PRESIDENT     [employer]   Social History Main Topics  . Smoking status: Never Smoker   . Smokeless tobacco: Never Used  . Alcohol Use: Yes  . Drug Use: No  . Sexual Activity: No   Other Topics Concern  . Not on file   Social  History Narrative   Lives with wife.      ROS: no fevers or chills, productive cough, hemoptysis, dysphasia, odynophagia, melena, hematochezia, dysuria, hematuria, rash, seizure activity, orthopnea, PND, claudication. Remaining systems are negative.  Physical Exam: Well-developed well-nourished in no acute distress.  Skin is warm and dry.  HEENT is normal.  Neck is supple.  Chest is clear to auscultation with normal expansion.  Cardiovascular exam is regular rate and rhythm.  Abdominal exam nontender or distended. No masses palpated. Extremities show 1+ edema. neuro grossly intact  ECG sinus rhythm at a  rate of 76. First degree AV block. Left bundle branch block.

## 2013-11-14 NOTE — Assessment & Plan Note (Signed)
Not on aspirin given need for anticoagulation. 

## 2013-11-14 NOTE — Assessment & Plan Note (Signed)
Continue present dose of Lasix and take an additional 40 mg in the afternoon as needed.

## 2013-11-14 NOTE — Patient Instructions (Signed)
Your physician recommends that you schedule a follow-up appointment in: 8 WEEKS WITH DR CRENSHAW  

## 2013-12-17 ENCOUNTER — Other Ambulatory Visit (HOSPITAL_BASED_OUTPATIENT_CLINIC_OR_DEPARTMENT_OTHER): Payer: Medicare Other

## 2013-12-17 DIAGNOSIS — M545 Low back pain, unspecified: Secondary | ICD-10-CM

## 2013-12-17 DIAGNOSIS — E859 Amyloidosis, unspecified: Secondary | ICD-10-CM

## 2013-12-17 DIAGNOSIS — R609 Edema, unspecified: Secondary | ICD-10-CM

## 2013-12-17 DIAGNOSIS — D72829 Elevated white blood cell count, unspecified: Secondary | ICD-10-CM | POA: Diagnosis not present

## 2013-12-17 DIAGNOSIS — I471 Supraventricular tachycardia: Secondary | ICD-10-CM

## 2013-12-17 LAB — URIC ACID (CC13): URIC ACID, SERUM: 6.2 mg/dL (ref 2.6–7.4)

## 2013-12-17 LAB — CBC WITH DIFFERENTIAL/PLATELET
BASO%: 0.8 % (ref 0.0–2.0)
Basophils Absolute: 0.1 10*3/uL (ref 0.0–0.1)
EOS ABS: 0 10*3/uL (ref 0.0–0.5)
EOS%: 0.4 % (ref 0.0–7.0)
HEMATOCRIT: 44.3 % (ref 38.4–49.9)
HEMOGLOBIN: 14.7 g/dL (ref 13.0–17.1)
LYMPH#: 1.3 10*3/uL (ref 0.9–3.3)
LYMPH%: 12.5 % — ABNORMAL LOW (ref 14.0–49.0)
MCH: 32.1 pg (ref 27.2–33.4)
MCHC: 33.2 g/dL (ref 32.0–36.0)
MCV: 96.5 fL (ref 79.3–98.0)
MONO#: 1 10*3/uL — AB (ref 0.1–0.9)
MONO%: 10.1 % (ref 0.0–14.0)
NEUT%: 76.2 % — ABNORMAL HIGH (ref 39.0–75.0)
NEUTROS ABS: 7.6 10*3/uL — AB (ref 1.5–6.5)
Platelets: 228 10*3/uL (ref 140–400)
RBC: 4.59 10*6/uL (ref 4.20–5.82)
RDW: 14 % (ref 11.0–14.6)
WBC: 10 10*3/uL (ref 4.0–10.3)

## 2013-12-17 LAB — COMPREHENSIVE METABOLIC PANEL (CC13)
ALBUMIN: 3.4 g/dL — AB (ref 3.5–5.0)
ALT: 12 U/L (ref 0–55)
ANION GAP: 11 meq/L (ref 3–11)
AST: 16 U/L (ref 5–34)
Alkaline Phosphatase: 107 U/L (ref 40–150)
BUN: 17.9 mg/dL (ref 7.0–26.0)
CO2: 26 meq/L (ref 22–29)
CREATININE: 1 mg/dL (ref 0.7–1.3)
Calcium: 9.3 mg/dL (ref 8.4–10.4)
Chloride: 104 mEq/L (ref 98–109)
GLUCOSE: 97 mg/dL (ref 70–140)
POTASSIUM: 4.1 meq/L (ref 3.5–5.1)
Sodium: 141 mEq/L (ref 136–145)
Total Bilirubin: 0.47 mg/dL (ref 0.20–1.20)
Total Protein: 7 g/dL (ref 6.4–8.3)

## 2013-12-17 LAB — LACTATE DEHYDROGENASE (CC13): LDH: 171 U/L (ref 125–245)

## 2013-12-19 DIAGNOSIS — M545 Low back pain, unspecified: Secondary | ICD-10-CM | POA: Diagnosis not present

## 2013-12-19 DIAGNOSIS — I471 Supraventricular tachycardia: Secondary | ICD-10-CM | POA: Diagnosis not present

## 2013-12-19 DIAGNOSIS — R609 Edema, unspecified: Secondary | ICD-10-CM | POA: Diagnosis not present

## 2013-12-19 DIAGNOSIS — E859 Amyloidosis, unspecified: Secondary | ICD-10-CM | POA: Diagnosis not present

## 2013-12-19 LAB — SPEP & IFE WITH QIG
ALPHA-1-GLOBULIN: 4.5 % (ref 2.9–4.9)
ALPHA-2-GLOBULIN: 14.4 % — AB (ref 7.1–11.8)
Albumin ELP: 56.1 % (ref 55.8–66.1)
Beta 2: 5.1 % (ref 3.2–6.5)
Beta Globulin: 5.4 % (ref 4.7–7.2)
GAMMA GLOBULIN: 14.5 % (ref 11.1–18.8)
IgA: 185 mg/dL (ref 68–379)
IgG (Immunoglobin G), Serum: 994 mg/dL (ref 650–1600)
IgM, Serum: 35 mg/dL — ABNORMAL LOW (ref 41–251)
TOTAL PROTEIN, SERUM ELECTROPHOR: 6.4 g/dL (ref 6.0–8.3)

## 2013-12-19 LAB — KAPPA/LAMBDA LIGHT CHAINS
KAPPA LAMBDA RATIO: 1.3 (ref 0.26–1.65)
Kappa free light chain: 3.13 mg/dL — ABNORMAL HIGH (ref 0.33–1.94)
Lambda Free Lght Chn: 2.41 mg/dL (ref 0.57–2.63)

## 2013-12-19 LAB — BETA 2 MICROGLOBULIN, SERUM: BETA 2 MICROGLOBULIN: 2.45 mg/L — AB (ref 1.01–1.73)

## 2013-12-21 LAB — UPEP/TP, 24-HR URINE
COLLECTION INTERVAL: 24 h
TOTAL PROTEIN, URINE/DAY: 91 mg/d (ref 50–100)
TOTAL VOLUME, URINE: 2275 mL
Total Protein, Urine: 4 mg/dL

## 2013-12-24 LAB — UIFE/LIGHT CHAINS/TP QN, 24-HR UR
Albumin, U: DETECTED
Alpha 1, Urine: DETECTED — AB
Alpha 2, Urine: DETECTED — AB
Beta, Urine: DETECTED — AB
FREE LT CHN EXCR RATE: 112.39 mg/d
Free Kappa Lt Chains,Ur: 4.94 mg/dL — ABNORMAL HIGH (ref 0.14–2.42)
Free Kappa/Lambda Ratio: 35.29 ratio — ABNORMAL HIGH (ref 2.04–10.37)
Free Lambda Excretion/Day: 3.19 mg/d
Free Lambda Lt Chains,Ur: 0.14 mg/dL (ref 0.02–0.67)
Gamma Globulin, Urine: DETECTED — AB
TOTAL PROTEIN, URINE-UPE24: 5.6 mg/dL
TOTAL PROTEIN, URINE-UR/DAY: 127 mg/d (ref 10–140)
Time: 24 hours
Volume, Urine: 2275 mL

## 2013-12-31 ENCOUNTER — Encounter: Payer: Self-pay | Admitting: *Deleted

## 2013-12-31 ENCOUNTER — Ambulatory Visit: Payer: Medicare Other | Admitting: Hematology and Oncology

## 2014-01-01 ENCOUNTER — Telehealth: Payer: Self-pay | Admitting: *Deleted

## 2014-01-01 NOTE — Telephone Encounter (Signed)
Pt left a VM requesting to r/s his missed office visit from yesterday.  States he forgot he had an appt yesterday and would like to r/s asap.  POF sent to scheduler to get pt re-scheduled.

## 2014-01-02 ENCOUNTER — Telehealth: Payer: Self-pay | Admitting: Hematology and Oncology

## 2014-01-02 NOTE — Telephone Encounter (Signed)
s/w pt re appt for 1/29 @ 12pm

## 2014-01-09 ENCOUNTER — Encounter: Payer: Medicare Other | Admitting: Cardiology

## 2014-01-09 NOTE — Progress Notes (Signed)
HPI: FU atrial fibrillation. 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 LHC 10/2009: pLAD 205, dLAD 30%, mOM2 70-80% (unchanged compared to previous), pRCA 20%, dRCA 20%. Cardiac MRI in January of 2011 showed an ejection fraction of 57%, mild biatrial enlargement, mild right ventricular enlargement. Cardiac amyloid could not be definitively excluded. Patient was admitted in February of 2012 with a wide complex tachycardia. Seen by EP and felt most likely SVT with aberrancy. Treated transiently with amiodarone but then placed on beta blockade. It was felt that if symptoms recur ablation would be warranted. Echo 10/2012: Abnormal septal motion from ?bundle branch block, moderate LVH, EF 55-60%, mild MR, moderate LAE mild RAE, PASP 53. Admitted with atrial fibrillation 6/14 and treated with anticoagulation and rate control. TSH normal. Had cardioversion July 2014. However atrial fibrillation recurred. He was placed on amiodarone. He converted to sinus rhythm. However he discontinued this because he thought it was causing leg weakness. Last seen in Dec 2014.    Current Outpatient Prescriptions  Medication Sig Dispense Refill  . acyclovir (ZOVIRAX) 400 MG tablet Take 1 tablet (400 mg total) by mouth daily.  90 tablet  3  . furosemide (LASIX) 40 MG tablet Take 1 tablet (40 mg total) by mouth 2 (two) times daily.  180 tablet  3  . Rivaroxaban (XARELTO) 20 MG TABS Take 1 tablet (20 mg total) by mouth daily with supper.  30 tablet  11   No current facility-administered medications for this visit.     Past Medical History  Diagnosis Date  . Hypertension   . Coronary atherosclerosis of unspecified type of vessel, native or graft     Non obstructive  . Cerebrovascular disease, unspecified   . Pure hypercholesterolemia   . Diverticulosis of colon (without mention of hemorrhage)   . Benign neoplasm of colon   . Unspecified hemorrhoids without  mention of complication   . Degenerative disc disease   . 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 Hemangioma   . Amyloidosis   . LBBB (left bundle branch block)   . 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 OSA (obstructive sleep apnea)   . Atrial fibrillation   . Cancer     amyloidosis  . Edema 08/29/2013    Past Surgical History  Procedure Laterality Date  . Knee arthroscopy    . Carpal tunnel release  08/2006    Dr Doy Mince at [facility] - Bilateral  . 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 Back surgery      Lumbar  . Cardioversion N/A 07/10/2013    Procedure: CARDIOVERSION;  Surgeon: Lelon Perla, MD;  Location: [facility] ENDOSCOPY;  Service: Cardiovascular;  Laterality: N/A;    History   Social History  . Marital Status: Married    Spouse Name: N/A    Number of Children: 2  . Years of Education: N/A   Occupational History  . PRESIDENT     [employer]   Social History Main Topics  . Smoking status: Never Smoker   . Smokeless tobacco: Never Used  . Alcohol Use: Yes  . Drug Use: No  . Sexual Activity: No   Other Topics Concern  . Not on file   Social History Narrative   Lives with wife.      ROS: no fevers or chills, productive cough, hemoptysis, dysphasia, odynophagia, melena, hematochezia,  dysuria, hematuria, rash, seizure activity, orthopnea, PND, pedal edema, claudication. Remaining systems are negative.  Physical Exam: Well-developed well-nourished in no acute distress.  Skin is warm and dry.  HEENT is normal.  Neck is supple.  Chest is clear to auscultation with normal expansion.  Cardiovascular exam is regular rate and rhythm.  Abdominal exam nontender or distended. No masses palpated. Extremities show no edema. neuro grossly intact  ECG     This encounter was created in error - please disregard.

## 2014-01-10 ENCOUNTER — Ambulatory Visit (HOSPITAL_BASED_OUTPATIENT_CLINIC_OR_DEPARTMENT_OTHER): Payer: BLUE CROSS/BLUE SHIELD | Admitting: Hematology and Oncology

## 2014-01-10 ENCOUNTER — Telehealth: Payer: Self-pay | Admitting: *Deleted

## 2014-01-10 ENCOUNTER — Encounter: Payer: Self-pay | Admitting: Hematology and Oncology

## 2014-01-10 VITALS — BP 162/81 | HR 74 | Temp 97.7°F | Resp 20 | Wt 272.7 lb

## 2014-01-10 DIAGNOSIS — Z7901 Long term (current) use of anticoagulants: Secondary | ICD-10-CM

## 2014-01-10 DIAGNOSIS — I4891 Unspecified atrial fibrillation: Secondary | ICD-10-CM

## 2014-01-10 DIAGNOSIS — E8589 Other amyloidosis: Secondary | ICD-10-CM | POA: Diagnosis not present

## 2014-01-10 DIAGNOSIS — E859 Amyloidosis, unspecified: Secondary | ICD-10-CM

## 2014-01-10 NOTE — Progress Notes (Signed)
[facility] OFFICE PROGRESS NOTE  Patient Care Team: Noralee Space, MD as PCP - General (Pulmonary Disease) Lelon Perla, MD (Cardiology) Jeanann Lewandowsky as Consulting Physician (Internal Medicine)  DIAGNOSIS: Systemic amyloidosis  SUMMARY OF ONCOLOGIC HISTORY: He was diagnosed with systemic amyloidosis after presentation with shortness of breath. He has significant evaluation including bone marrow aspirate and biopsy. Bone marrow biopsy, from a couple like a restrictive disease. 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 The patient subsequently went for maintain dense chemotherapy with Velcade for almost 2 years and then switch to Revlimid. He self discontinue Revlimid in May of 2014 due to fatigue.   INTERVAL HISTORY: Johnny Mitchell 73 y.o. male returns for further followup. He expressed concern because his have easy bruising especially around his eye lid on the left side today. He denies any trauma. He remain on anticoagulation therapy for atrial fibrillation. The patient denies any recent signs or symptoms of bleeding such as spontaneous epistaxis, hematuria or hematochezia. He has very mild leg edema. He denies any recent fever, chills, night sweats or abnormal weight loss Denies any recent infection.    I have reviewed the past medical history, past surgical history, social history and family history with the patient and they are unchanged from previous note.  ALLERGIES:  has No Known Allergies.  MEDICATIONS:  Current Outpatient Prescriptions  Medication Sig Dispense Refill  . acyclovir (ZOVIRAX) 400 MG tablet Take 1 tablet (400 mg total) by mouth daily.  90 tablet  3  . furosemide (LASIX) 40 MG tablet Take 1 tablet (40 mg total) by mouth 2 (two) times daily.  180 tablet  3  . Rivaroxaban (XARELTO)  20 MG TABS Take 1 tablet (20 mg total) by mouth daily with supper.  30 tablet  11   No current facility-administered medications for this visit.    REVIEW OF SYSTEMS:   Constitutional: Denies fevers, chills or abnormal weight loss Eyes: Denies blurriness of vision Ears, nose, mouth, throat, and face: Denies mucositis or sore throat Respiratory: Denies cough, dyspnea or wheezes Gastrointestinal:  Denies nausea, heartburn or change in bowel habits Skin: Denies abnormal skin rashes Neurological:Denies numbness, tingling or new weaknesses Behavioral/Psych: Mood is stable, no new changes  All other systems were reviewed with the patient and are negative.  PHYSICAL EXAMINATION: ECOG PERFORMANCE STATUS: 1 - Symptomatic but completely ambulatory  Filed Vitals:   01/10/14 1231  BP: 162/81  Pulse: 74  Temp: 97.7 F (36.5 C)  Resp: 20   Filed Weights   01/10/14 1231  Weight: 272 lb 11.2 oz (123.696 kg)    GENERAL:alert, no distress and comfortable. He is moderately obese SKIN: Noted skin bruising around his left eyelid consistent was manifested of amyloidosis  EYES: normal, Conjunctiva are red and appeared injected, sclera clear OROPHARYNX:no exudate, no erythema and lips, buccal mucosa, and tongue normal  NECK: supple, thyroid normal size, non-tender, without nodularity LYMPH:  no palpable lymphadenopathy in the cervical, axillary or inguinal LUNGS: clear to auscultation and percussion with normal breathing effort HEART: regular rate & rhythm and no murmurs and moderate bilateral lower extremity edema ABDOMEN:abdomen soft, non-tender and normal bowel sounds Musculoskeletal:no cyanosis of digits and no clubbing  NEURO: alert & oriented x 3 with fluent speech, no focal motor/sensory deficits  LABORATORY DATA:  I have reviewed the data as listed  Component Value Date/Time   NA 141 12/17/2013 1051   NA 141 08/22/2013 1032   K 4.1 12/17/2013 1051   K 4.1 08/22/2013 1032   CL 105  08/22/2013 1032   CL 103 05/31/2013 0842   CO2 26 12/17/2013 1051   CO2 31 08/22/2013 1032   GLUCOSE 97 12/17/2013 1051   GLUCOSE 104* 08/22/2013 1032   GLUCOSE 118* 05/31/2013 0842   BUN 17.9 12/17/2013 1051   BUN 17 08/22/2013 1032   CREATININE 1.0 12/17/2013 1051   CREATININE 1.13 08/22/2013 1032   CREATININE 1.47* 06/01/2013 0548   CREATININE 0.92 12/17/2011 1606   CALCIUM 9.3 12/17/2013 1051   CALCIUM 8.7 08/22/2013 1032   PROT 7.0 12/17/2013 1051   PROT 6.0 08/03/2012 0843   ALBUMIN 3.4* 12/17/2013 1051   ALBUMIN 3.5 08/03/2012 0843   AST 16 12/17/2013 1051   AST 14 08/03/2012 0843   ALT 12 12/17/2013 1051   ALT 18 08/03/2012 0843   ALKPHOS 107 12/17/2013 1051   ALKPHOS 83 08/03/2012 0843   BILITOT 0.47 12/17/2013 1051   BILITOT 0.6 08/03/2012 0843   GFRNONAA 45* 06/01/2013 0548   GFRAA 52* 06/01/2013 0548    No results found for this basename: SPEP,  UPEP,   kappa and lambda light chains    Lab Results  Component Value Date   WBC 10.0 12/17/2013   NEUTROABS 7.6* 12/17/2013   HGB 14.7 12/17/2013   HCT 44.3 12/17/2013   MCV 96.5 12/17/2013   PLT 228 12/17/2013      Chemistry      Component Value Date/Time   NA 141 12/17/2013 1051   NA 141 08/22/2013 1032   K 4.1 12/17/2013 1051   K 4.1 08/22/2013 1032   CL 105 08/22/2013 1032   CL 103 05/31/2013 0842   CO2 26 12/17/2013 1051   CO2 31 08/22/2013 1032   BUN 17.9 12/17/2013 1051   BUN 17 08/22/2013 1032   CREATININE 1.0 12/17/2013 1051   CREATININE 1.13 08/22/2013 1032   CREATININE 1.47* 06/01/2013 0548   CREATININE 0.92 12/17/2011 1606      Component Value Date/Time   CALCIUM 9.3 12/17/2013 1051   CALCIUM 8.7 08/22/2013 1032   ALKPHOS 107 12/17/2013 1051   ALKPHOS 83 08/03/2012 0843   AST 16 12/17/2013 1051   AST 14 08/03/2012 0843   ALT 12 12/17/2013 1051   ALT 18 08/03/2012 0843   BILITOT 0.47 12/17/2013 1051   BILITOT 0.6 08/03/2012 0843     ASSESSMENT & PLAN:  #1 Systemic amyloidosis The patient expressed major concerns because of easy bruising and elevated light  chains, and felt that his amyloidosis has recurred. I made him a promise I will contact his physicians at [facility] about the next plan of care, whether he needs to proceed with systemic chemotherapy or clinical trial. I attempted to call his physician today but unable to get hold her. I will try again tomorrow. #2 history of cardiac arrhythmia He continue on amiodarone and anticoagulation therapy with no bleeding complications #3 antimicrobial prophylaxis Due to his her long-term treatment with Velcade, I recommend he continue on acyclovir  All questions were answered. The patient knows to call the clinic with any problems, questions or concerns. No barriers to learning was detected. I spent 25 minutes counseling the patient face to face. The total time spent in the appointment was 40 minutes and more than 50% was on counseling and review of test results     [name],  Elfrida Pixley, MD 01/10/2014 7:45 PM

## 2014-01-10 NOTE — Telephone Encounter (Signed)
Pt left VM states ph # for Dr. Dorthula Matas (sp?) is [phone] and their fax [phone]  At [facility] in [place].  Say his "customer client number" is [phone].  He requests notes and labs be faxed to her office when ready.

## 2014-01-11 ENCOUNTER — Telehealth: Payer: Self-pay | Admitting: Hematology and Oncology

## 2014-01-11 ENCOUNTER — Telehealth: Payer: Self-pay | Admitting: Cardiology

## 2014-01-11 ENCOUNTER — Telehealth: Payer: Self-pay | Admitting: *Deleted

## 2014-01-11 ENCOUNTER — Other Ambulatory Visit: Payer: Self-pay | Admitting: Hematology and Oncology

## 2014-01-11 DIAGNOSIS — E859 Amyloidosis, unspecified: Secondary | ICD-10-CM

## 2014-01-11 NOTE — Telephone Encounter (Signed)
New Problem:  Pt would like to see Dr. [name] in [place] or [place]. The soonest appt is 2/25 at either place. Pt states that's not soon enough. Pt does not wish to see a NP or PA. Pt would like to speak to Hilda Blades to know if he can be worked in.

## 2014-01-11 NOTE — Telephone Encounter (Signed)
Office note and labs faxed to Dr. Mardelle Matte at fax [phone].

## 2014-01-11 NOTE — Telephone Encounter (Signed)
Pt left VM would like to speak with Dr. Alvy Bimler regarding a conversation he had w/ MD in [place] yesterday.  He says he also needs to get scheduled for some additional lab work.

## 2014-01-11 NOTE — Telephone Encounter (Signed)
I spoke with Dr. Mardelle Matte from [facility] about the patient's abnormal blood work. I was advised to recheck his blood work and see him back in 8 weeks. I left a voicemail on the patient's telephone informing him about the phone conversation.

## 2014-01-11 NOTE — Telephone Encounter (Signed)
S/w the pt and he is aware of his march 2015 appts.

## 2014-01-17 ENCOUNTER — Telehealth: Payer: Self-pay | Admitting: Cardiology

## 2014-01-17 NOTE — Telephone Encounter (Signed)
Pt calls today to make appointment with Dr. [name] regarding worsening shortness of breath. Pt states he thinks this may be his atrial fib & maybe his medication needs to be adjusted.  Pt wanted appointment today or early next week.  He is not in acute distress  I made an appointment for pt with Richardson Dopp PA on 01/31/14 same day Dr. [name] is in office but no appt availability. Pt agrees with this.  Will forward this to Dr. [name] for review.  Horton Chin RN

## 2014-01-17 NOTE — Telephone Encounter (Signed)
New problem   Pt having sob and chest pain

## 2014-01-31 ENCOUNTER — Other Ambulatory Visit: Payer: Medicare Other

## 2014-01-31 ENCOUNTER — Ambulatory Visit (INDEPENDENT_AMBULATORY_CARE_PROVIDER_SITE_OTHER): Payer: Medicare Other | Admitting: Physician Assistant

## 2014-01-31 ENCOUNTER — Encounter: Payer: Self-pay | Admitting: Physician Assistant

## 2014-01-31 VITALS — BP 120/72 | HR 72 | Ht 70.0 in | Wt 280.0 lb

## 2014-01-31 DIAGNOSIS — I509 Heart failure, unspecified: Secondary | ICD-10-CM | POA: Diagnosis not present

## 2014-01-31 DIAGNOSIS — I4891 Unspecified atrial fibrillation: Secondary | ICD-10-CM | POA: Diagnosis not present

## 2014-01-31 DIAGNOSIS — I471 Supraventricular tachycardia: Secondary | ICD-10-CM

## 2014-01-31 DIAGNOSIS — I10 Essential (primary) hypertension: Secondary | ICD-10-CM | POA: Diagnosis not present

## 2014-01-31 DIAGNOSIS — E859 Amyloidosis, unspecified: Secondary | ICD-10-CM

## 2014-01-31 DIAGNOSIS — E7800 Pure hypercholesterolemia, unspecified: Secondary | ICD-10-CM | POA: Diagnosis not present

## 2014-01-31 DIAGNOSIS — I2510 Atherosclerotic heart disease of native coronary artery without angina pectoris: Secondary | ICD-10-CM

## 2014-01-31 DIAGNOSIS — R0602 Shortness of breath: Secondary | ICD-10-CM | POA: Diagnosis not present

## 2014-01-31 MED ORDER — NITROGLYCERIN 0.4 MG SL SUBL: 0.4000 mg | SUBLINGUAL_TABLET | SUBLINGUAL | Status: DC | PRN

## 2014-01-31 MED ORDER — AMIODARONE HCL 200 MG PO TABS: 200.0000 mg | ORAL_TABLET | Freq: Every day | ORAL | Status: DC

## 2014-01-31 MED ORDER — FUROSEMIDE 40 MG PO TABS: 40.0000 mg | ORAL_TABLET | Freq: Every day | ORAL | Status: DC

## 2014-01-31 NOTE — Patient Instructions (Addendum)
RE-START LASIX 40 MG DAILY  AN RX FOR NTG HAS BEEN SENT IN TO [facility] AND YOU HAVE BEEN ADVISED AS TO HOW AND WHEN TO USE NTG  LAB WORK TODAY; BMET, CBC W/DIFF, TSH, BNP, LFT  Your physician has requested that you have a lexiscan myoview. For further information please visit [email or web address]. Please follow instruction sheet, as given.  Your physician recommends that you schedule a follow-up appointment in: 2 WEEKS WITH DR. CRENSHAW OR [facility], PAC SAME DAY DR. [name] IS IN THE OFFICE  TRY OTC HYDROCORTISONE CREAM FOR YOUR RASH; APPLY TWICE DAILY TO AFFECTED AREA X 1 WEEK; IF NOT BETTER BY THEN YOU WILL NEED TO FOLLOW UP WITH YOUR PRIMARY CARE PHYSICIAN

## 2014-01-31 NOTE — Progress Notes (Signed)
[address], [place], [place]  [address] Phone: [phone] Fax:  [phone]  Date:  01/31/2014   ID:  LEANDRE WIEN, DOB Feb 19, 1941, MRN [id number]  PCP:  Noralee Space, MD  Cardiologist:  Dr. [name]     History of Present Illness: Johnny Mitchell is a 73 y.o. male with a hx of SVT, Primary AL Amyloidosis (s/p stem cell transplant 05/2010), nonobstructive CAD. LHC 10/2009: pLAD 20%, dLAD 30%, mOM2 70-80% (unchanged compared to previous), pRCA 20%, dRCA 20%. Cardiac MRI in January of 2011 showed an ejection fraction of 57%, mild biatrial enlargement, mild right ventricular enlargement. Cardiac amyloid could not be definitively excluded.   Patient was admitted in February of 2012 with a wide complex tachycardia. Seen by EP and rhythm was most likely felt to be SVT with aberrancy. Treated transiently with amiodarone but then placed on beta blockade. It was felt that if symptoms recur ablation would be warranted.   Echo 10/2012: Abnormal septal motion from ?bundle branch block, moderate LVH, EF 55-60%, mild MR, moderate LAE mild RAE, PASP 53.   Admitted in 05/2013 with atrial fibrillation with RVR. He underwent cardioversion 06/2013. However, atrial fibrillation recurred. He was placed on amiodarone and converted to NSR. Patient was last seen by Dr. [name] 11/2013. The patient had taken himself off of amiodarone due to leg weakness. Xarelto was continued for anticoagulation.  Patient has recently been experiencing increased dyspnea. This has occurred over the last 1 month. Of note, the patient has been seen back to [place] for follow up on his amyloidosis. Cardiac MRI (09/05/13): Moderate LVE, focal septal hypertrophy, EF 51%, global HK, mild RVE, normal RVSF, no evidence of cardiac amyloid. He has had some increase in his light chains on electrophoresis. This is being followed. The patient decided to resume his amiodarone about 2 weeks ago. He took this twice a day for 2  weeks and then reduced it to once a day. He thinks he may feel somewhat better. Of note, he did stop his Lasix about 2 weeks ago. Overall, he describes NYHA class III dyspnea. He denies chest pain, syncope. He does note increased fatigue. He denies increased abdominal girth. He has noted weight gain. He denies orthopnea, PND or edema.  Recent Labs: 05/31/2013: Pro B Natriuretic peptide (BNP) 2523.0*  08/29/2013: TSH 1.504  12/17/2013: ALT 12; Creatinine 1.0; Hemoglobin 14.7; Potassium 4.1   Wt Readings from Last 3 Encounters:  01/31/14 280 lb (127.007 kg)  01/10/14 272 lb 11.2 oz (123.696 kg)  11/14/13 274 lb (124.286 kg)     Past Medical History  Diagnosis Date  . Hypertension   . Coronary atherosclerosis of unspecified type of vessel, native or graft     Non obstructive  . Cerebrovascular disease, unspecified   . Pure hypercholesterolemia   . Diverticulosis of colon (without mention of hemorrhage)   . Benign neoplasm of colon   . Unspecified hemorrhoids without mention of complication   . Degenerative disc disease   . 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 Hemangioma   . Amyloidosis   . LBBB (left bundle branch block)   . 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 OSA (obstructive sleep apnea)   . Atrial fibrillation   . Cancer     amyloidosis  . Edema 08/29/2013    Current Outpatient Prescriptions  Medication Sig Dispense Refill  . acyclovir (ZOVIRAX) 400 MG tablet  Take 1 tablet (400 mg total) by mouth daily.  90 tablet  3  . furosemide (LASIX) 40 MG tablet Take 1 tablet (40 mg total) by mouth 2 (two) times daily.  180 tablet  3  . Rivaroxaban (XARELTO) 20 MG TABS Take 1 tablet (20 mg total) by mouth daily with supper.  30 tablet  11   No current facility-administered medications for this visit.    Allergies:   Review of patient's allergies indicates no known allergies.   Social History:  The patient  reports  that he has never smoked. He has never used smokeless tobacco. He reports that he drinks alcohol. He reports that he does not use illicit drugs.   Family History:  The patient's family history includes Cancer in his father and mother.   ROS:  Please see the history of present illness.   He does note a rash on his chest. This is not pruritic   All other systems reviewed and negative.   PHYSICAL EXAM: VS:  BP 120/72  Pulse 72  Ht 5\' 10"  (1.778 m)  Wt 280 lb (127.007 kg)  BMI 40.18 kg/m2 Well nourished, well developed, in no acute distress HEENT: normal Neck: no JVD Cardiac:  normal S1, S2; RRR; no murmur Lungs:  clear to auscultation bilaterally, no wheezing, rhonchi or rales Abd: soft, nontender, no hepatomegaly Ext: trace-1+ bilateral LE edema Skin: warm and dry; scattered maculopapular rash with excoriations on his chest Neuro:  CNs 2-12 intact, no focal abnormalities noted  EKG:  NSR, HR 72, LBBB     ASSESSMENT AND PLAN:  1. Dyspnea: This is fairly chronic. The patient is somewhat concerned about coronary artery disease contributing to his symptoms. He has a prior history of nonobstructive disease by cardiac catheterization in 2010. Recent cardiac MRI does not demonstrate evidence of cardiac amyloid. His ejection fraction remains preserved. He does have some evidence of volume overload on exam. He stopped his Lasix for unclear reasons about 1-2 weeks ago. He also resumed his amiodarone. It is not clear to me if the patient was actually in atrial fibrillation at the time that he started to feel more short of breath. In any event, he is back in NSR now.  I will arrange a Lexiscan Myoview to exclude ischemia. I will check a basic metabolic panel, CBC, TSH, LFTs and BNP. He will resume his Lasix. 2. Atrial Fibrillation:  Maintaining NSR. Continue Xarelto. Now that he is back on amiodarone, this will be continued. 3. Amyloidosis:  Continue follow up with oncology. 4. CAD:  Obtain Lexiscan  Myoview as noted. He is not on aspirin as he is on Xarelto. He is intolerant to statins. 5. Diastolic CHF:  Resume Lasix 40 mg daily. 6. Hypertension:  Controlled. 7. Hyperlipidemia:  He is intolerant to statins. 8. Paroxysmal SVT:  No apparent recurrence. 9. Rash: He may use OTC hydrocortisone cream twice daily as needed. If there is no improvement or resolution, he should follow up with primary care. He has been able to tolerate amiodarone in the past. I do not believe that Amiodarone is the cause for his rash. 10. Disposition:  Follow up with Dr. [name] or me in 2 weeks.  Signed, Richardson Dopp, PA-C  01/31/2014 11:22 AM

## 2014-02-01 LAB — CBC WITH DIFFERENTIAL/PLATELET
Basophils Absolute: 0 10*3/uL (ref 0.0–0.1)
Basophils Relative: 0.5 % (ref 0.0–3.0)
Eosinophils Absolute: 0.1 10*3/uL (ref 0.0–0.7)
Eosinophils Relative: 1.2 % (ref 0.0–5.0)
HEMATOCRIT: 42.7 % (ref 39.0–52.0)
Hemoglobin: 13.6 g/dL (ref 13.0–17.0)
LYMPHS ABS: 1.2 10*3/uL (ref 0.7–4.0)
Lymphocytes Relative: 12.7 % (ref 12.0–46.0)
MCHC: 31.9 g/dL (ref 30.0–36.0)
MCV: 97.9 fl (ref 78.0–100.0)
MONO ABS: 0.7 10*3/uL (ref 0.1–1.0)
MONOS PCT: 7.2 % (ref 3.0–12.0)
Neutro Abs: 7.2 10*3/uL (ref 1.4–7.7)
Neutrophils Relative %: 78.4 % — ABNORMAL HIGH (ref 43.0–77.0)
PLATELETS: 213 10*3/uL (ref 150.0–400.0)
RBC: 4.37 Mil/uL (ref 4.22–5.81)
RDW: 14.1 % (ref 11.5–14.6)
WBC: 9.2 10*3/uL (ref 4.5–10.5)

## 2014-02-01 LAB — HEPATIC FUNCTION PANEL
ALBUMIN: 3.2 g/dL — AB (ref 3.5–5.2)
ALK PHOS: 91 U/L (ref 39–117)
ALT: 13 U/L (ref 0–53)
AST: 16 U/L (ref 0–37)
Bilirubin, Direct: 0.2 mg/dL (ref 0.0–0.3)
Total Bilirubin: 0.8 mg/dL (ref 0.3–1.2)
Total Protein: 5.9 g/dL — ABNORMAL LOW (ref 6.0–8.3)

## 2014-02-01 LAB — BRAIN NATRIURETIC PEPTIDE: PRO B NATRI PEPTIDE: 204 pg/mL — AB (ref 0.0–100.0)

## 2014-02-01 LAB — BASIC METABOLIC PANEL
BUN: 18 mg/dL (ref 6–23)
CO2: 31 mEq/L (ref 19–32)
Calcium: 8.9 mg/dL (ref 8.4–10.5)
Chloride: 105 mEq/L (ref 96–112)
Creatinine, Ser: 1.1 mg/dL (ref 0.4–1.5)
GFR: 66.59 mL/min (ref >60.00)
GLUCOSE: 83 mg/dL (ref 70–99)
POTASSIUM: 4.4 meq/L (ref 3.5–5.1)
Sodium: 139 mEq/L (ref 135–145)

## 2014-02-01 LAB — TSH: TSH: 2.19 u[IU]/mL (ref 0.35–5.50)

## 2014-02-11 ENCOUNTER — Encounter: Payer: Self-pay | Admitting: Cardiology

## 2014-02-13 ENCOUNTER — Ambulatory Visit (HOSPITAL_COMMUNITY): Payer: Medicare Other | Attending: Internal Medicine | Admitting: Radiology

## 2014-02-13 ENCOUNTER — Encounter: Payer: Self-pay | Admitting: Internal Medicine

## 2014-02-13 VITALS — BP 142/64 | HR 68 | Ht 71.0 in | Wt 267.0 lb

## 2014-02-13 DIAGNOSIS — I4891 Unspecified atrial fibrillation: Secondary | ICD-10-CM | POA: Diagnosis not present

## 2014-02-13 DIAGNOSIS — I10 Essential (primary) hypertension: Secondary | ICD-10-CM | POA: Insufficient documentation

## 2014-02-13 DIAGNOSIS — I2510 Atherosclerotic heart disease of native coronary artery without angina pectoris: Secondary | ICD-10-CM

## 2014-02-13 DIAGNOSIS — R5381 Other malaise: Secondary | ICD-10-CM | POA: Insufficient documentation

## 2014-02-13 DIAGNOSIS — I447 Left bundle-branch block, unspecified: Secondary | ICD-10-CM | POA: Diagnosis not present

## 2014-02-13 DIAGNOSIS — I498 Other specified cardiac arrhythmias: Secondary | ICD-10-CM | POA: Diagnosis not present

## 2014-02-13 DIAGNOSIS — I252 Old myocardial infarction: Secondary | ICD-10-CM | POA: Insufficient documentation

## 2014-02-13 DIAGNOSIS — R5383 Other fatigue: Secondary | ICD-10-CM | POA: Diagnosis not present

## 2014-02-13 DIAGNOSIS — R0602 Shortness of breath: Secondary | ICD-10-CM | POA: Insufficient documentation

## 2014-02-13 MED ORDER — ADENOSINE (DIAGNOSTIC) 3 MG/ML IV SOLN
0.5600 mg/kg | Freq: Once | INTRAVENOUS | Status: AC
  Administered 2014-02-13: 60 mg via INTRAVENOUS

## 2014-02-13 MED ORDER — TECHNETIUM TC 99M SESTAMIBI GENERIC - CARDIOLITE
10.0000 | Freq: Once | INTRAVENOUS | Status: AC | PRN
  Administered 2014-02-13: 10 via INTRAVENOUS

## 2014-02-13 MED ORDER — TECHNETIUM TC 99M SESTAMIBI GENERIC - CARDIOLITE
30.0000 | Freq: Once | INTRAVENOUS | Status: AC | PRN
  Administered 2014-02-13: 30 via INTRAVENOUS

## 2014-02-13 NOTE — Progress Notes (Signed)
[place] [address] [place], [place] [address] [phone]    Cardiology Nuclear Med Study  Johnny Mitchell is a 73 y.o. male     MRN : [id number]     DOB: 1941/11/18  Procedure Date: 02/13/2014  Nuclear Med Background Indication for Stress Test:  Evaluation for Ischemia History:  CAD, MI, Cath (no CAD), Afib, Hx. SVT, Echo 2013 EF 55-60%, MPI 2010 EF 56%, Cardiac MRI 2014 EF 51% Cardiac Risk Factors: Hypertension, LBBB and Lipids  Symptoms:  Fatigue and SOB   Nuclear Pre-Procedure Caffeine/Decaff Intake:  None> 12 hrs NPO After: 6:00pm   Lungs:  clear O2 Sat: 95% on room air. IV 0.9% NS with Angio Cath:  22g  IV Site: R Wrist x 1, tolerated well IV Started by:  Irven Baltimore, RN  Chest Size (in):  50 Cup Size: n/a  Height: 5\' 11"  (1.803 m)  Weight:  267 lb (121.11 kg)  BMI:  Body mass index is 37.26 kg/(m^2). Tech Comments:  Took morning medications    Nuclear Med Study 1 or 2 day study: 1 day  Stress Test Type:  Adenosine  Reading MD: N/A  Order Authorizing Provider:  [name], MD  Resting Radionuclide: Technetium 85m Sestamibi  Resting Radionuclide Dose: 11.0 mCi   Stress Radionuclide:  Technetium 79m Sestamibi  Stress Radionuclide Dose: 33.0 mCi           Stress Protocol Rest HR: 68 Stress HR: 74  Rest BP: 142/64 Stress BP: 142/68  Exercise Time (min): n/a METS: n/a           Dose of Adenosine (mg):  60 mg Dose of Lexiscan: n/a mg  Dose of Atropine (mg): n/a Dose of Dobutamine: n/a mcg/kg/min (at max HR)  Stress Test Technologist: Glade Lloyd, BS-ES  Nuclear Technologist:  Vedia Pereyra, CNMT     Rest Procedure:  Myocardial perfusion imaging was performed at rest 45 minutes following the intravenous administration of Technetium 58m Sestamibi. Rest ECG: Normal sinus rhythm. Left bundle branch block.  Stress Procedure:  The patient received IV adenosine at 140 mcg/kg/min for 4 minutes.  Technetium 37m Sestamibi  was injected at the 2 minute mark and quantitative spect images were obtained after a 45 minute delay. Stress ECG: No significant change from baseline ECG  QPS Raw Data Images:  Normal; no motion artifact; normal heart/lung ratio. Stress Images:  Small area of mild decreased uptake at the apical cap, apical septal segment, and apical lateral segment. Rest Images:  Images at rest are similar to stress images. Subtraction (SDS):  No evidence of ischemia. Transient Ischemic Dilatation (Normal <1.22):  1.02 Lung/Heart Ratio (Normal <0.45):  0.33  Quantitative Gated Spect Images QGS EDV:  197 ml QGS ESV:  102 ml  Impression Exercise Capacity:  Adenosine study with no exercise. BP Response:  Normal blood pressure response. Clinical Symptoms:  Chest tightness ECG Impression:  No significant ST segment change suggestive of ischemia. Comparison with Prior Nuclear Study:  This study is compared to the report of the study of October, 2010  Overall Impression:  This study shows an area of mild decreased uptake at the apex that is probably apical thinning. There is no significant ischemia. There is very slight global hypokinesis. It is possible that the left ventricle is slightly larger than the prior study from 2010. The recorded ejection fraction is mildly reduced since 2010. This is a low-risk scan.  LV Ejection Fraction: 48%.  LV  Wall Motion:  Very mild global hypokinesis.  Dola Argyle, MD

## 2014-02-14 ENCOUNTER — Encounter: Payer: Self-pay | Admitting: Physician Assistant

## 2014-02-20 ENCOUNTER — Encounter: Payer: Self-pay | Admitting: Cardiology

## 2014-02-20 ENCOUNTER — Ambulatory Visit (INDEPENDENT_AMBULATORY_CARE_PROVIDER_SITE_OTHER): Payer: Medicare Other | Admitting: Cardiology

## 2014-02-20 VITALS — BP 126/74 | HR 70 | Ht 71.0 in | Wt 271.0 lb

## 2014-02-20 DIAGNOSIS — I2510 Atherosclerotic heart disease of native coronary artery without angina pectoris: Secondary | ICD-10-CM | POA: Diagnosis not present

## 2014-02-20 DIAGNOSIS — E7800 Pure hypercholesterolemia, unspecified: Secondary | ICD-10-CM | POA: Diagnosis not present

## 2014-02-20 DIAGNOSIS — I5032 Chronic diastolic (congestive) heart failure: Secondary | ICD-10-CM | POA: Diagnosis not present

## 2014-02-20 DIAGNOSIS — I4891 Unspecified atrial fibrillation: Secondary | ICD-10-CM

## 2014-02-20 DIAGNOSIS — I10 Essential (primary) hypertension: Secondary | ICD-10-CM | POA: Diagnosis not present

## 2014-02-20 DIAGNOSIS — E859 Amyloidosis, unspecified: Secondary | ICD-10-CM

## 2014-02-20 MED ORDER — PRAVASTATIN SODIUM 40 MG PO TABS: 40.0000 mg | ORAL_TABLET | Freq: Every evening | ORAL | Status: DC

## 2014-02-20 MED ORDER — NITROGLYCERIN 0.4 MG SL SUBL: 0.4000 mg | SUBLINGUAL_TABLET | SUBLINGUAL | Status: DC | PRN

## 2014-02-20 NOTE — Assessment & Plan Note (Signed)
Blood pressure controlled. 

## 2014-02-20 NOTE — Assessment & Plan Note (Signed)
Management per oncology. 

## 2014-02-20 NOTE — Patient Instructions (Signed)
Your physician recommends that you schedule a follow-up appointment in: 3 MONTHS WITH DR CRENSHAW  START PRAVASTATIN 40 MG ONCE DAILY AT BEDTIME  Your physician recommends that you return for lab work in: [place] TO LAB WORK

## 2014-02-20 NOTE — Assessment & Plan Note (Signed)
Continue amiodarone. 

## 2014-02-20 NOTE — Progress Notes (Signed)
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 LHC 10/2009: pLAD 20%, dLAD 30%, mOM2 70-80% (unchanged compared to previous), pRCA 20%, dRCA 20%. Cardiac MRI in January of 2011 showed an ejection fraction of 57%, mild biatrial enlargement, mild right ventricular enlargement. Cardiac amyloid could not be definitively excluded.  Admitted in February of 2012 with a wide complex tachycardia. Seen by EP and rhythm was most likely felt to be SVT with aberrancy. Treated transiently with amiodarone but then placed on beta blockade. It was felt that if symptoms recur ablation would be warranted.  Echo 10/2012: Abnormal septal motion from ?bundle branch block, moderate LVH, EF 55-60%, mild MR, moderate LAE mild RAE, PASP 53.  Admitted in 05/2013 with atrial fibrillation with RVR. He underwent cardioversion 06/2013. However, atrial fibrillation recurred. He was placed on amiodarone and converted to NSR. Patient was last seen by Dr. [name] 11/2013. The patient had taken himself off of amiodarone due to leg weakness. Xarelto was continued for anticoagulation.  Patient has recently been experiencing increased dyspnea. This has occurred over the last 1 month. Of note, the patient has been seen back to [place] for follow up on his amyloidosis. Cardiac MRI (09/05/13): Moderate LVE, focal septal hypertrophy, EF 51%, global HK, mild RVE, normal RVSF, no evidence of cardiac amyloid. He has had some increase in his light chains on electrophoresis. This is being followed. Nuclear study in March of 2015 showed apical thinning but no ischemia. Ejection fraction was 48%. Note his Lasix was resumed at previous office visit. He also resumed his amiodarone on his own. Since he was last seen he notes some dyspnea on exertion but no orthopnea, PND, chest pain or syncope. Chronic mild pedal edema.   Current Outpatient Prescriptions  Medication Sig Dispense Refill  . acyclovir (ZOVIRAX)  400 MG tablet Take 400 mg by mouth once.      Marland Kitchen amiodarone (PACERONE) 200 MG tablet Take 1 tablet (200 mg total) by mouth daily.  30 tablet  11  . furosemide (LASIX) 40 MG tablet Take 1 tablet (40 mg total) by mouth daily.  30 tablet  11  . nitroGLYCERIN (NITROSTAT) 0.4 MG SL tablet Place 1 tablet (0.4 mg total) under the tongue every 5 (five) minutes as needed for chest pain.  25 tablet  3  . Rivaroxaban (XARELTO) 20 MG TABS Take 1 tablet (20 mg total) by mouth daily with supper.  30 tablet  11   No current facility-administered medications for this visit.     Past Medical History  Diagnosis Date  . Hypertension   . Coronary atherosclerosis of unspecified type of vessel, native or graft     Non obstructive  . Cerebrovascular disease, unspecified   . Pure hypercholesterolemia   . Diverticulosis of colon (without mention of hemorrhage)   . Benign neoplasm of colon   . Unspecified hemorrhoids without mention of complication   . Degenerative disc disease   . 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 Hemangioma   . Amyloidosis   . LBBB (left bundle branch block)   . 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 OSA (obstructive sleep apnea)   . Atrial fibrillation   . Cancer     amyloidosis  . Edema 08/29/2013  . Hx of cardiovascular stress test     Adenosine Myoview (02/2014): Apical thinning, no ischemia, EF 48%; low risk.  Past Surgical History  Procedure Laterality Date  . Knee arthroscopy    . Carpal tunnel release  08/2006    Dr Doy Mince at [facility] - Bilateral  . 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 Back surgery      Lumbar  . Cardioversion N/A 07/10/2013    Procedure: CARDIOVERSION;  Surgeon: Lelon Perla, MD;  Location: [facility] ENDOSCOPY;  Service: Cardiovascular;  Laterality: N/A;    History   Social History  . Marital Status: Married    Spouse Name: N/A    Number of Children: 2  . Years of Education: N/A    Occupational History  . PRESIDENT     [employer]   Social History Main Topics  . Smoking status: Never Smoker   . Smokeless tobacco: Never Used  . Alcohol Use: Yes  . Drug Use: No  . Sexual Activity: No   Other Topics Concern  . Not on file   Social History Narrative   Lives with wife.      ROS: no fevers or chills, productive cough, hemoptysis, dysphasia, odynophagia, melena, hematochezia, dysuria, hematuria, rash, seizure activity, orthopnea, PND, claudication. Remaining systems are negative.  Physical Exam: Well-developed well-nourished in no acute distress.  Skin is warm and dry.  HEENT is normal.  Neck is supple.  Chest is clear to auscultation with normal expansion.  Cardiovascular exam is regular rate and rhythm.  Abdominal exam nontender or distended. No masses palpated. Extremities show 1+ edema. neuro grossly intact

## 2014-02-20 NOTE — Assessment & Plan Note (Signed)
Continue amiodarone. He will need followup TSH, liver functions and chest x-ray in the future.

## 2014-02-20 NOTE — Assessment & Plan Note (Signed)
Resume statin.  Not on aspirin given need for anticoagulation.

## 2014-02-20 NOTE — Assessment & Plan Note (Signed)
Continue present dose of Lasix. Take additional 40 mg daily as needed.

## 2014-02-20 NOTE — Assessment & Plan Note (Signed)
Add Pravachol 40 mg daily. Check lipids and liver in 6 weeks.

## 2014-03-05 ENCOUNTER — Other Ambulatory Visit: Payer: Self-pay | Admitting: Hematology and Oncology

## 2014-03-05 ENCOUNTER — Other Ambulatory Visit (HOSPITAL_BASED_OUTPATIENT_CLINIC_OR_DEPARTMENT_OTHER): Payer: Medicare Other

## 2014-03-05 DIAGNOSIS — E859 Amyloidosis, unspecified: Secondary | ICD-10-CM

## 2014-03-05 DIAGNOSIS — M899 Disorder of bone, unspecified: Secondary | ICD-10-CM | POA: Diagnosis not present

## 2014-03-05 DIAGNOSIS — E8589 Other amyloidosis: Secondary | ICD-10-CM | POA: Diagnosis not present

## 2014-03-05 DIAGNOSIS — M949 Disorder of cartilage, unspecified: Secondary | ICD-10-CM | POA: Diagnosis not present

## 2014-03-05 LAB — CBC WITH DIFFERENTIAL/PLATELET
BASO%: 0.4 % (ref 0.0–2.0)
Basophils Absolute: 0 10*3/uL (ref 0.0–0.1)
EOS ABS: 0 10*3/uL (ref 0.0–0.5)
EOS%: 0.4 % (ref 0.0–7.0)
HCT: 43.8 % (ref 38.4–49.9)
HGB: 14 g/dL (ref 13.0–17.1)
LYMPH#: 1.2 10*3/uL (ref 0.9–3.3)
LYMPH%: 17.6 % (ref 14.0–49.0)
MCH: 31 pg (ref 27.2–33.4)
MCHC: 32 g/dL (ref 32.0–36.0)
MCV: 96.9 fL (ref 79.3–98.0)
MONO#: 0.6 10*3/uL (ref 0.1–0.9)
MONO%: 9 % (ref 0.0–14.0)
NEUT%: 72.6 % (ref 39.0–75.0)
NEUTROS ABS: 4.9 10*3/uL (ref 1.5–6.5)
PLATELETS: 191 10*3/uL (ref 140–400)
RBC: 4.52 10*6/uL (ref 4.20–5.82)
RDW: 13.6 % (ref 11.0–14.6)
WBC: 6.8 10*3/uL (ref 4.0–10.3)

## 2014-03-05 LAB — COMPREHENSIVE METABOLIC PANEL (CC13)
ALBUMIN: 3.2 g/dL — AB (ref 3.5–5.0)
ALT: 14 U/L (ref 0–55)
ANION GAP: 9 meq/L (ref 3–11)
AST: 16 U/L (ref 5–34)
Alkaline Phosphatase: 97 U/L (ref 40–150)
BUN: 21.8 mg/dL (ref 7.0–26.0)
CALCIUM: 9.1 mg/dL (ref 8.4–10.4)
CO2: 26 meq/L (ref 22–29)
Chloride: 108 mEq/L (ref 98–109)
Creatinine: 1.2 mg/dL (ref 0.7–1.3)
Glucose: 136 mg/dl (ref 70–140)
Potassium: 4 mEq/L (ref 3.5–5.1)
SODIUM: 144 meq/L (ref 136–145)
TOTAL PROTEIN: 6.3 g/dL — AB (ref 6.4–8.3)
Total Bilirubin: 0.47 mg/dL (ref 0.20–1.20)

## 2014-03-06 ENCOUNTER — Other Ambulatory Visit: Payer: Self-pay | Admitting: Hematology and Oncology

## 2014-03-06 DIAGNOSIS — E859 Amyloidosis, unspecified: Secondary | ICD-10-CM

## 2014-03-07 LAB — SPEP & IFE WITH QIG
ALPHA-2-GLOBULIN: 12.6 % — AB (ref 7.1–11.8)
Albumin ELP: 54.1 % — ABNORMAL LOW (ref 55.8–66.1)
Alpha-1-Globulin: 7.5 % — ABNORMAL HIGH (ref 2.9–4.9)
BETA GLOBULIN: 6.6 % (ref 4.7–7.2)
Beta 2: 4.1 % (ref 3.2–6.5)
GAMMA GLOBULIN: 15.1 % (ref 11.1–18.8)
IGG (IMMUNOGLOBIN G), SERUM: 1020 mg/dL (ref 650–1600)
IgA: 182 mg/dL (ref 68–379)
IgM, Serum: 36 mg/dL — ABNORMAL LOW (ref 41–251)
Total Protein, Serum Electrophoresis: 5.9 g/dL — ABNORMAL LOW (ref 6.0–8.3)

## 2014-03-07 LAB — KAPPA/LAMBDA LIGHT CHAINS
KAPPA FREE LGHT CHN: 3.5 mg/dL — AB (ref 0.33–1.94)
Kappa:Lambda Ratio: 2.01 — ABNORMAL HIGH (ref 0.26–1.65)
Lambda Free Lght Chn: 1.74 mg/dL (ref 0.57–2.63)

## 2014-03-08 LAB — UPEP/TP, 24-HR URINE
COLLECTION INTERVAL: 24 h
TOTAL PROTEIN, URINE/DAY: 75 mg/d (ref 50–100)
TOTAL VOLUME, URINE: 1875 mL
Total Protein, Urine: 4 mg/dL

## 2014-03-08 LAB — UIFE/LIGHT CHAINS/TP QN, 24-HR UR
Albumin, U: DETECTED
Alpha 1, Urine: DETECTED — AB
Alpha 2, Urine: DETECTED — AB
BETA UR: DETECTED — AB
FREE KAPPA LT CHAINS, UR: 5.13 mg/dL — AB (ref 0.14–2.42)
FREE LAMBDA LT CHAINS, UR: 0.1 mg/dL (ref 0.02–0.67)
FREE LT CHN EXCR RATE: 96.19 mg/d
Free Kappa/Lambda Ratio: 51.3 ratio — ABNORMAL HIGH (ref 2.04–10.37)
Free Lambda Excretion/Day: 1.88 mg/d
Gamma Globulin, Urine: DETECTED — AB
TOTAL PROTEIN, URINE-UR/DAY: 107 mg/d (ref 10–140)
Time: 24 hours
Total Protein, Urine: 5.7 mg/dL
VOLUME, URINE-UPE24: 1875 mL

## 2014-03-12 ENCOUNTER — Ambulatory Visit (HOSPITAL_BASED_OUTPATIENT_CLINIC_OR_DEPARTMENT_OTHER): Payer: Medicare Other | Admitting: Hematology and Oncology

## 2014-03-12 ENCOUNTER — Telehealth: Payer: Self-pay | Admitting: Hematology and Oncology

## 2014-03-12 ENCOUNTER — Other Ambulatory Visit: Payer: Medicare Other

## 2014-03-12 ENCOUNTER — Encounter: Payer: Self-pay | Admitting: Hematology and Oncology

## 2014-03-12 VITALS — BP 147/66 | HR 73 | Temp 97.4°F | Resp 20 | Ht 71.0 in | Wt 275.0 lb

## 2014-03-12 DIAGNOSIS — I4891 Unspecified atrial fibrillation: Secondary | ICD-10-CM | POA: Diagnosis not present

## 2014-03-12 DIAGNOSIS — M549 Dorsalgia, unspecified: Secondary | ICD-10-CM | POA: Diagnosis not present

## 2014-03-12 DIAGNOSIS — E8589 Other amyloidosis: Secondary | ICD-10-CM

## 2014-03-12 DIAGNOSIS — Z7901 Long term (current) use of anticoagulants: Secondary | ICD-10-CM | POA: Diagnosis not present

## 2014-03-12 DIAGNOSIS — E859 Amyloidosis, unspecified: Secondary | ICD-10-CM

## 2014-03-12 NOTE — Progress Notes (Signed)
[facility] OFFICE PROGRESS NOTE  Patient Care Team: Noralee Space, MD as PCP - General (Pulmonary Disease) Lelon Perla, MD (Cardiology) Jeanann Lewandowsky as Consulting Physician (Internal Medicine)  DIAGNOSIS: Systemic Amylodosis  SUMMARY OF ONCOLOGIC HISTORY: I have reviewed the patient's chart extensively collaborated the history with the patient. He was diagnosed with systemic amyloidosis after presentation with shortness of breath. He has significant evaluation including bone marrow aspirate and biopsy. 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 The patient subsequently went for maintain dense chemotherapy with Velcade for almost 2 years and then switch to Revlimid. He self discontinue Revlimid in May of 2014 due to fatigue.   INTERVAL HISTORY: Johnny Mitchell 73 y.o. male returns for further follow-up. He complained of chronic back pain, slightly worse recently. Denies recent infection. He remained on anticoagulation therapy. The patient denies any recent signs or symptoms of bleeding such as spontaneous epistaxis, hematuria or hematochezia.  I have reviewed the past medical history, past surgical history, social history and family history with the patient and they are unchanged from previous note.  ALLERGIES:  has No Known Allergies.  MEDICATIONS:  Current Outpatient Prescriptions  Medication Sig Dispense Refill  . acyclovir (ZOVIRAX) 400 MG tablet Take 400 mg by mouth once.      Marland Kitchen amiodarone (PACERONE) 200 MG tablet Take 1 tablet (200 mg total) by mouth daily.  30 tablet  11  . furosemide (LASIX) 40 MG tablet Take 1 tablet (40 mg total) by mouth daily.  30 tablet  11  . nitroGLYCERIN (NITROSTAT) 0.4 MG SL tablet Place 1 tablet (0.4 mg  total) under the tongue every 5 (five) minutes as needed for chest pain.  25 tablet  12  . pravastatin (PRAVACHOL) 40 MG tablet Take 1 tablet (40 mg total) by mouth every evening.  30 tablet  11  . Rivaroxaban (XARELTO) 20 MG TABS Take 1 tablet (20 mg total) by mouth daily with supper.  30 tablet  11   No current facility-administered medications for this visit.    REVIEW OF SYSTEMS:   Constitutional: Denies fevers, chills or abnormal weight loss Eyes: Denies blurriness of vision Ears, nose, mouth, throat, and face: Denies mucositis or sore throat Respiratory: Denies cough, dyspnea or wheezes Cardiovascular: Denies palpitation, chest discomfort or lower extremity swelling Gastrointestinal:  Denies nausea, heartburn or change in bowel habits Skin: Denies abnormal skin rashes Lymphatics: Denies new lymphadenopathy or easy bruising Neurological:Denies numbness, tingling or new weaknesses Behavioral/Psych: Mood is stable, no new changes  All other systems were reviewed with the patient and are negative.  PHYSICAL EXAMINATION: ECOG PERFORMANCE STATUS: 1  Filed Vitals:   03/12/14 0938  BP: 147/66  Pulse: 73  Temp: 97.4 F (36.3 C)  Resp: 20   Filed Weights   03/12/14 0938  Weight: 275 lb (124.739 kg)    GENERAL:alert, no distress and comfortable SKIN: skin color, texture, turgor are normal, no rashes or significant lesions EYES: normal, Conjunctiva are pink and non-injected, sclera clear OROPHARYNX:no exudate, no erythema and lips, buccal mucosa, and tongue normal  NECK: supple, thyroid normal size, non-tender, without nodularity LYMPH:  no palpable lymphadenopathy in the cervical, axillary or inguinal LUNGS: clear to auscultation and percussion with normal breathing effort HEART: regular rate & rhythm and no murmurs with moderate bilateral lower  extremity edema ABDOMEN:abdomen soft, non-tender and normal bowel sounds Musculoskeletal:no cyanosis of digits and no clubbing   NEURO: alert & oriented x 3 with fluent speech, no focal motor/sensory deficits  LABORATORY DATA:  I have reviewed the data as listed    Component Value Date/Time   NA 144 03/05/2014 0823   NA 139 01/31/2014 1613   K 4.0 03/05/2014 0823   K 4.4 01/31/2014 1613   CL 105 01/31/2014 1613   CL 103 05/31/2013 0842   CO2 26 03/05/2014 0823   CO2 31 01/31/2014 1613   GLUCOSE 136 03/05/2014 0823   GLUCOSE 83 01/31/2014 1613   GLUCOSE 118* 05/31/2013 0842   BUN 21.8 03/05/2014 0823   BUN 18 01/31/2014 1613   CREATININE 1.2 03/05/2014 0823   CREATININE 1.1 01/31/2014 1613   CREATININE 1.13 08/22/2013 1032   CREATININE 0.92 12/17/2011 1606   CALCIUM 9.1 03/05/2014 0823   CALCIUM 8.9 01/31/2014 1613   PROT 6.3* 03/05/2014 0823   PROT 5.9* 01/31/2014 1613   ALBUMIN 3.2* 03/05/2014 0823   ALBUMIN 3.2* 01/31/2014 1613   AST 16 03/05/2014 0823   AST 16 01/31/2014 1613   ALT 14 03/05/2014 0823   ALT 13 01/31/2014 1613   ALKPHOS 97 03/05/2014 0823   ALKPHOS 91 01/31/2014 1613   BILITOT 0.47 03/05/2014 0823   BILITOT 0.8 01/31/2014 1613   GFRNONAA 64 08/22/2013 1032   GFRNONAA 45* 06/01/2013 0548   GFRAA 74 08/22/2013 1032   GFRAA 52* 06/01/2013 0548    No results found for this basename: SPEP,  UPEP,   kappa and lambda light chains    Lab Results  Component Value Date   WBC 6.8 03/05/2014   NEUTROABS 4.9 03/05/2014   HGB 14.0 03/05/2014   HCT 43.8 03/05/2014   MCV 96.9 03/05/2014   PLT 191 03/05/2014      Chemistry      Component Value Date/Time   NA 144 03/05/2014 0823   NA 139 01/31/2014 1613   K 4.0 03/05/2014 0823   K 4.4 01/31/2014 1613   CL 105 01/31/2014 1613   CL 103 05/31/2013 0842   CO2 26 03/05/2014 0823   CO2 31 01/31/2014 1613   BUN 21.8 03/05/2014 0823   BUN 18 01/31/2014 1613   CREATININE 1.2 03/05/2014 0823   CREATININE 1.1 01/31/2014 1613   CREATININE 1.13 08/22/2013 1032   CREATININE 0.92 12/17/2011 1606      Component Value Date/Time   CALCIUM 9.1 03/05/2014 0823   CALCIUM 8.9 01/31/2014 1613    ALKPHOS 97 03/05/2014 0823   ALKPHOS 91 01/31/2014 1613   AST 16 03/05/2014 0823   AST 16 01/31/2014 1613   ALT 14 03/05/2014 0823   ALT 13 01/31/2014 1613   BILITOT 0.47 03/05/2014 0823   BILITOT 0.8 01/31/2014 1613     ASSESSMENT & PLAN: #1 Systemic amyloidosis Light chains are marginally elevated but overall he is not symptomatic I recommend repeat blood work, history and physical examination in 3 months #2 history of cardiac arrhythmia He continue on amiodarone and anticoagulation therapy with no bleeding complications #3 antimicrobial prophylaxis Due to his her long-term treatment with Velcade, I recommend he continue on acyclovir #4 Chronic back pain I recommend vitamin D supplement and conservation management with exercise program and weight loss. Recent MRI was negative for disease  Orders Placed This Encounter  Procedures  . CBC with Differential    Standing Status: Future     Number of Occurrences:  Standing Expiration Date: 03/12/2015  . Comprehensive metabolic panel    Standing Status: Future     Number of Occurrences:      Standing Expiration Date: 03/12/2015  . SPEP & IFE with QIG    Standing Status: Future     Number of Occurrences:      Standing Expiration Date: 03/12/2015  . Protein Electro, 24-Hour Urine    Standing Status: Future     Number of Occurrences:      Standing Expiration Date: 03/12/2015  . Kappa/lambda light chains    Standing Status: Future     Number of Occurrences:      Standing Expiration Date: 03/12/2015  . IFE, Urine (with Tot Prot)    Standing Status: Future     Number of Occurrences:      Standing Expiration Date: 03/12/2015   All questions were answered. The patient knows to call the clinic with any problems, questions or concerns. No barriers to learning was detected. I spent  15 minutes counseling the patient face to face. The total time spent in the appointment was 25 minutes and more than 50% was on counseling and review of test  results     Murphy Watson [facility], [place], MD 03/12/2014 6:46 PM

## 2014-03-12 NOTE — Telephone Encounter (Signed)
[facility] PT APPT SCHEDULE FOR JUNE °

## 2014-05-08 ENCOUNTER — Telehealth: Payer: Self-pay | Admitting: Pulmonary Disease

## 2014-05-08 DIAGNOSIS — D126 Benign neoplasm of colon, unspecified: Secondary | ICD-10-CM

## 2014-05-08 NOTE — Telephone Encounter (Signed)
lmtcb x1 

## 2014-05-08 NOTE — Telephone Encounter (Signed)
Per SN--  Pt will need to set up with new primary care doctor---recs for [facility] Ok to set up with GI to see Dr. Carlean Purl for routine colonoscopy.

## 2014-05-08 NOTE — Telephone Encounter (Signed)
SN please advise if you want me to set up appt with pt or refer out to new PCP>  thanks

## 2014-05-08 NOTE — Telephone Encounter (Signed)
My Chart Message copied below:    Appointment Request From: Lucita Lora  With Provider: Noralee Space, MD [-Primary Care Physician-] Preferred Date Range: From 05/08/2014 To 05/10/2014  Preferred Times: Any Reason for visit:  Office Visit Comments: Is Lenna Gilford still my primary doctor or has he died? I need to schedule a colonoscopy. [phone]

## 2014-05-08 NOTE — Telephone Encounter (Signed)
Pt returning call.Johnny Mitchell ° °

## 2014-05-15 ENCOUNTER — Encounter: Payer: Self-pay | Admitting: Cardiology

## 2014-05-15 ENCOUNTER — Ambulatory Visit (INDEPENDENT_AMBULATORY_CARE_PROVIDER_SITE_OTHER): Payer: Medicare Other | Admitting: Cardiology

## 2014-05-15 VITALS — BP 148/82 | HR 67 | Ht 71.0 in | Wt 273.8 lb

## 2014-05-15 DIAGNOSIS — I10 Essential (primary) hypertension: Secondary | ICD-10-CM | POA: Diagnosis not present

## 2014-05-15 DIAGNOSIS — E7800 Pure hypercholesterolemia, unspecified: Secondary | ICD-10-CM

## 2014-05-15 DIAGNOSIS — I2510 Atherosclerotic heart disease of native coronary artery without angina pectoris: Secondary | ICD-10-CM

## 2014-05-15 DIAGNOSIS — I4891 Unspecified atrial fibrillation: Secondary | ICD-10-CM

## 2014-05-15 DIAGNOSIS — I471 Supraventricular tachycardia: Secondary | ICD-10-CM

## 2014-05-15 DIAGNOSIS — I5032 Chronic diastolic (congestive) heart failure: Secondary | ICD-10-CM

## 2014-05-15 DIAGNOSIS — R0602 Shortness of breath: Secondary | ICD-10-CM

## 2014-05-15 DIAGNOSIS — E859 Amyloidosis, unspecified: Secondary | ICD-10-CM

## 2014-05-15 MED ORDER — FUROSEMIDE 40 MG PO TABS: 60.0000 mg | ORAL_TABLET | Freq: Every day | ORAL | Status: DC

## 2014-05-15 MED ORDER — RIVAROXABAN 20 MG PO TABS: 20.0000 mg | ORAL_TABLET | Freq: Every day | ORAL | Status: DC

## 2014-05-15 NOTE — Patient Instructions (Signed)
Your physician wants you to follow-up in: [place] will receive a reminder letter in the mail two months in advance. If you don't receive a letter, please call our office to schedule the follow-up appointment.   INCREASE FUROSEMIDE TO 60 MG ONCE DAILY= 1 AND 1/2 TABLETS ONCE DAILY  Your physician recommends that you return for lab work in: [place]  A chest x-ray takes a picture of the organs and structures inside the chest, including the heart, lungs, and blood vessels. This test can show several things, including, whether the heart is enlarges; whether fluid is building up in the lungs; and whether pacemaker / defibrillator leads are still in place. IN ONE WEEK IN [place]

## 2014-05-15 NOTE — Assessment & Plan Note (Signed)
Managed by oncology

## 2014-05-15 NOTE — Assessment & Plan Note (Signed)
Continue statin. 

## 2014-05-15 NOTE — Assessment & Plan Note (Signed)
Blood pressure controlled. Continue present medications. 

## 2014-05-15 NOTE — Assessment & Plan Note (Signed)
Continue amiodarone. Check TSH, liver functions and chest x-ray. 

## 2014-05-15 NOTE — Assessment & Plan Note (Signed)
Patient remains in sinus rhythm. Continue amiodarone. Continue xarelto.

## 2014-05-15 NOTE — Assessment & Plan Note (Signed)
Mildly volume overloaded. Increase Lasix to 60 mg daily. Check potassium and renal function in one week.

## 2014-05-15 NOTE — Assessment & Plan Note (Signed)
Continue statin. Not on aspirin given need for anticoagulation. 

## 2014-05-15 NOTE — Progress Notes (Signed)
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 LHC 10/2009: pLAD 20%, dLAD 30%, mOM2 70-80% (unchanged compared to previous), pRCA 20%, dRCA 20%. Cardiac MRI in January of 2011 showed an ejection fraction of 57%, mild biatrial enlargement, mild right ventricular enlargement. Cardiac amyloid could not be definitively excluded.  Admitted in February of 2012 with a wide complex tachycardia. Seen by EP and rhythm was most likely felt to be SVT with aberrancy. It was felt that if symptoms recur ablation would be warranted.  Echo 10/2012: Abnormal septal motion from ?bundle branch block, moderate LVH, EF 55-60%, mild MR, moderate LAE mild RAE, PASP 53.  Cardiac MRI (09/05/13): Moderate LVE, focal septal hypertrophy, EF 51%, global HK, mild RVE, normal RVSF, no evidence of cardiac amyloid. He has had some increase in his light chains on electrophoresis. This is being followed. Nuclear study in March of 2015 showed apical thinning but no ischemia. Ejection fraction was 48%. Last seen in March of 2015. Since then, He notes some dyspnea on exertion but no orthopnea, PND, chest pain or syncope. Minimal pedal edema.  Current Outpatient Prescriptions  Medication Sig Dispense Refill  . acyclovir (ZOVIRAX) 400 MG tablet Take 400 mg by mouth once.      Marland Kitchen amiodarone (PACERONE) 200 MG tablet Take 1 tablet (200 mg total) by mouth daily.  30 tablet  11  . furosemide (LASIX) 40 MG tablet Take 1 tablet (40 mg total) by mouth daily.  30 tablet  11  . nitroGLYCERIN (NITROSTAT) 0.4 MG SL tablet Place 1 tablet (0.4 mg total) under the tongue every 5 (five) minutes as needed for chest pain.  25 tablet  12  . pravastatin (PRAVACHOL) 40 MG tablet Take 1 tablet (40 mg total) by mouth every evening.  30 tablet  11  . Rivaroxaban (XARELTO) 20 MG TABS Take 1 tablet (20 mg total) by mouth daily with supper.  30 tablet  11   No current facility-administered medications for  this visit.     Past Medical History  Diagnosis Date  . Hypertension   . Coronary atherosclerosis of unspecified type of vessel, native or graft     Non obstructive  . Cerebrovascular disease, unspecified   . Pure hypercholesterolemia   . Diverticulosis of colon (without mention of hemorrhage)   . Benign neoplasm of colon   . Unspecified hemorrhoids without mention of complication   . Degenerative disc disease   . 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 Hemangioma   . Amyloidosis   . LBBB (left bundle branch block)   . 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 OSA (obstructive sleep apnea)   . Atrial fibrillation   . Cancer     amyloidosis  . Edema 08/29/2013  . Hx of cardiovascular stress test     Adenosine Myoview (02/2014): Apical thinning, no ischemia, EF 48%; low risk.    Past Surgical History  Procedure Laterality Date  . Knee arthroscopy    . Carpal tunnel release  08/2006    Dr Doy Mince [facility] - Bilateral  . 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 Back surgery      Lumbar  . Cardioversion N/A 07/10/2013    Procedure: CARDIOVERSION;  Surgeon: Lelon Perla, MD;  Location: [facility] ENDOSCOPY;  Service: Cardiovascular;  Laterality: N/A;    History   Social History  . Marital  Status: Married    Spouse Name: N/A    Number of Children: 2  . Years of Education: N/A   Occupational History  . PRESIDENT     [employer]   Social History Main Topics  . Smoking status: Never Smoker   . Smokeless tobacco: Never Used  . Alcohol Use: Yes  . Drug Use: No  . Sexual Activity: No   Other Topics Concern  . Not on file   Social History Narrative   Lives with wife.      ROS: no fevers or chills, productive cough, hemoptysis, dysphasia, odynophagia, melena, hematochezia, dysuria, hematuria, rash, seizure activity, orthopnea, PND, pedal edema, claudication. Remaining systems are negative.  Physical  Exam: Well-developed well-nourished in no acute distress.  Skin is warm and dry.  HEENT is normal.  Neck is supple.  Chest is clear to auscultation with normal expansion.  Cardiovascular exam is regular rate and rhythm.  Abdominal exam nontender or distended. No masses palpated. Extremities show trace edema. neuro grossly intact  ECG Sinus rhythm with first degree AV block, left bundle branch block.

## 2014-06-03 DIAGNOSIS — Z6837 Body mass index (BMI) 37.0-37.9, adult: Secondary | ICD-10-CM | POA: Diagnosis not present

## 2014-06-03 DIAGNOSIS — M48061 Spinal stenosis, lumbar region without neurogenic claudication: Secondary | ICD-10-CM | POA: Diagnosis not present

## 2014-06-03 DIAGNOSIS — M4310 Spondylolisthesis, site unspecified: Secondary | ICD-10-CM | POA: Diagnosis not present

## 2014-06-03 DIAGNOSIS — M47817 Spondylosis without myelopathy or radiculopathy, lumbosacral region: Secondary | ICD-10-CM | POA: Diagnosis not present

## 2014-06-04 ENCOUNTER — Other Ambulatory Visit (HOSPITAL_BASED_OUTPATIENT_CLINIC_OR_DEPARTMENT_OTHER): Payer: Medicare Other

## 2014-06-04 DIAGNOSIS — E859 Amyloidosis, unspecified: Secondary | ICD-10-CM

## 2014-06-04 DIAGNOSIS — E8589 Other amyloidosis: Secondary | ICD-10-CM | POA: Diagnosis not present

## 2014-06-04 LAB — CBC WITH DIFFERENTIAL/PLATELET
BASO%: 0.7 % (ref 0.0–2.0)
Basophils Absolute: 0.1 10*3/uL (ref 0.0–0.1)
EOS ABS: 0 10*3/uL (ref 0.0–0.5)
EOS%: 0.6 % (ref 0.0–7.0)
HCT: 45.9 % (ref 38.4–49.9)
HGB: 14.8 g/dL (ref 13.0–17.1)
LYMPH%: 10.4 % — AB (ref 14.0–49.0)
MCH: 31.7 pg (ref 27.2–33.4)
MCHC: 32.2 g/dL (ref 32.0–36.0)
MCV: 98.4 fL — ABNORMAL HIGH (ref 79.3–98.0)
MONO#: 0.8 10*3/uL (ref 0.1–0.9)
MONO%: 9.5 % (ref 0.0–14.0)
NEUT#: 6.4 10*3/uL (ref 1.5–6.5)
NEUT%: 78.8 % — ABNORMAL HIGH (ref 39.0–75.0)
PLATELETS: 193 10*3/uL (ref 140–400)
RBC: 4.66 10*6/uL (ref 4.20–5.82)
RDW: 13.8 % (ref 11.0–14.6)
WBC: 8.2 10*3/uL (ref 4.0–10.3)
lymph#: 0.9 10*3/uL (ref 0.9–3.3)

## 2014-06-04 LAB — COMPREHENSIVE METABOLIC PANEL (CC13)
ALBUMIN: 3.4 g/dL — AB (ref 3.5–5.0)
ALT: 14 U/L (ref 0–55)
ANION GAP: 9 meq/L (ref 3–11)
AST: 20 U/L (ref 5–34)
Alkaline Phosphatase: 116 U/L (ref 40–150)
BUN: 14.3 mg/dL (ref 7.0–26.0)
CALCIUM: 8.9 mg/dL (ref 8.4–10.4)
CO2: 29 meq/L (ref 22–29)
Chloride: 107 mEq/L (ref 98–109)
Creatinine: 1.3 mg/dL (ref 0.7–1.3)
GLUCOSE: 103 mg/dL (ref 70–140)
POTASSIUM: 4 meq/L (ref 3.5–5.1)
Sodium: 144 mEq/L (ref 136–145)
TOTAL PROTEIN: 6.7 g/dL (ref 6.4–8.3)
Total Bilirubin: 0.6 mg/dL (ref 0.20–1.20)

## 2014-06-05 DIAGNOSIS — E859 Amyloidosis, unspecified: Secondary | ICD-10-CM | POA: Diagnosis not present

## 2014-06-06 LAB — SPEP & IFE WITH QIG
ALPHA-2-GLOBULIN: 12.6 % — AB (ref 7.1–11.8)
Albumin ELP: 53.8 % — ABNORMAL LOW (ref 55.8–66.1)
Alpha-1-Globulin: 7 % — ABNORMAL HIGH (ref 2.9–4.9)
BETA GLOBULIN: 6.6 % (ref 4.7–7.2)
Beta 2: 4.3 % (ref 3.2–6.5)
GAMMA GLOBULIN: 15.7 % (ref 11.1–18.8)
IGG (IMMUNOGLOBIN G), SERUM: 1090 mg/dL (ref 650–1600)
IgA: 207 mg/dL (ref 68–379)
IgM, Serum: 31 mg/dL — ABNORMAL LOW (ref 41–251)
Total Protein, Serum Electrophoresis: 6.5 g/dL (ref 6.0–8.3)

## 2014-06-06 LAB — KAPPA/LAMBDA LIGHT CHAINS
KAPPA LAMBDA RATIO: 2.71 — AB (ref 0.26–1.65)
Kappa free light chain: 5.32 mg/dL — ABNORMAL HIGH (ref 0.33–1.94)
Lambda Free Lght Chn: 1.96 mg/dL (ref 0.57–2.63)

## 2014-06-07 LAB — UPEP/TP, 24-HR URINE
COLLECTION INTERVAL: 24 h
Total Protein, Urine/Day: 66 mg/d (ref 50–100)
Total Protein, Urine: 3 mg/dL
Total Volume, Urine: 2200 mL

## 2014-06-07 LAB — UIFE/LIGHT CHAINS/TP QN, 24-HR UR
ALPHA 2 UR: DETECTED — AB
Albumin, U: DETECTED
Alpha 1, Urine: DETECTED — AB
Beta, Urine: DETECTED — AB
FREE KAPPA LT CHAINS, UR: 5.18 mg/dL — AB (ref 0.14–2.42)
FREE LT CHN EXCR RATE: 113.96 mg/d
Free Kappa/Lambda Ratio: 43.17 ratio — ABNORMAL HIGH (ref 2.04–10.37)
Free Lambda Excretion/Day: 2.64 mg/d
Free Lambda Lt Chains,Ur: 0.12 mg/dL (ref 0.02–0.67)
GAMMA UR: DETECTED — AB
TOTAL PROTEIN, URINE-UPE24: 5.8 mg/dL
Time: 24 hours
Total Protein, Urine-Ur/day: 128 mg/d (ref 10–140)
Volume, Urine: 2200 mL

## 2014-06-10 DIAGNOSIS — M5126 Other intervertebral disc displacement, lumbar region: Secondary | ICD-10-CM | POA: Diagnosis not present

## 2014-06-10 DIAGNOSIS — I471 Supraventricular tachycardia: Secondary | ICD-10-CM | POA: Diagnosis not present

## 2014-06-10 DIAGNOSIS — IMO0002 Reserved for concepts with insufficient information to code with codable children: Secondary | ICD-10-CM | POA: Diagnosis not present

## 2014-06-10 DIAGNOSIS — R0602 Shortness of breath: Secondary | ICD-10-CM | POA: Diagnosis not present

## 2014-06-10 DIAGNOSIS — E7800 Pure hypercholesterolemia, unspecified: Secondary | ICD-10-CM | POA: Diagnosis not present

## 2014-06-10 DIAGNOSIS — M999 Biomechanical lesion, unspecified: Secondary | ICD-10-CM | POA: Diagnosis not present

## 2014-06-10 DIAGNOSIS — I5032 Chronic diastolic (congestive) heart failure: Secondary | ICD-10-CM | POA: Diagnosis not present

## 2014-06-10 DIAGNOSIS — M25559 Pain in unspecified hip: Secondary | ICD-10-CM | POA: Diagnosis not present

## 2014-06-10 DIAGNOSIS — I10 Essential (primary) hypertension: Secondary | ICD-10-CM | POA: Diagnosis not present

## 2014-06-10 DIAGNOSIS — E859 Amyloidosis, unspecified: Secondary | ICD-10-CM | POA: Diagnosis not present

## 2014-06-10 DIAGNOSIS — I4891 Unspecified atrial fibrillation: Secondary | ICD-10-CM | POA: Diagnosis not present

## 2014-06-10 LAB — BASIC METABOLIC PANEL WITH GFR
BUN: 18 mg/dL (ref 6–23)
CO2: 28 meq/L (ref 19–32)
Calcium: 8.3 mg/dL — ABNORMAL LOW (ref 8.4–10.5)
Chloride: 102 mEq/L (ref 96–112)
Creat: 1.13 mg/dL (ref 0.50–1.35)
GFR, EST AFRICAN AMERICAN: 73 mL/min
GFR, EST NON AFRICAN AMERICAN: 63 mL/min
Glucose, Bld: 150 mg/dL — ABNORMAL HIGH (ref 70–99)
Potassium: 3.6 mEq/L (ref 3.5–5.3)
Sodium: 138 mEq/L (ref 135–145)

## 2014-06-10 LAB — HEPATIC FUNCTION PANEL
ALT: 10 U/L (ref 0–53)
AST: 18 U/L (ref 0–37)
Albumin: 3.6 g/dL (ref 3.5–5.2)
Alkaline Phosphatase: 87 U/L (ref 39–117)
Bilirubin, Direct: 0.2 mg/dL (ref 0.0–0.3)
Indirect Bilirubin: 0.6 mg/dL (ref 0.2–1.2)
Total Bilirubin: 0.8 mg/dL (ref 0.2–1.2)
Total Protein: 6 g/dL (ref 6.0–8.3)

## 2014-06-10 LAB — TSH: TSH: 1.86 u[IU]/mL (ref 0.350–4.500)

## 2014-06-11 ENCOUNTER — Encounter: Payer: Self-pay | Admitting: Hematology and Oncology

## 2014-06-11 ENCOUNTER — Telehealth: Payer: Self-pay | Admitting: Hematology and Oncology

## 2014-06-11 ENCOUNTER — Telehealth: Payer: Self-pay | Admitting: *Deleted

## 2014-06-11 ENCOUNTER — Ambulatory Visit (HOSPITAL_BASED_OUTPATIENT_CLINIC_OR_DEPARTMENT_OTHER): Payer: Medicare Other | Admitting: Hematology and Oncology

## 2014-06-11 VITALS — BP 159/80 | HR 73 | Temp 97.6°F | Resp 20 | Ht 71.0 in | Wt 279.9 lb

## 2014-06-11 DIAGNOSIS — E859 Amyloidosis, unspecified: Secondary | ICD-10-CM

## 2014-06-11 DIAGNOSIS — I5032 Chronic diastolic (congestive) heart failure: Secondary | ICD-10-CM | POA: Diagnosis not present

## 2014-06-11 DIAGNOSIS — R609 Edema, unspecified: Secondary | ICD-10-CM

## 2014-06-11 MED ORDER — ONDANSETRON HCL 8 MG PO TABS: 8.0000 mg | ORAL_TABLET | Freq: Three times a day (TID) | ORAL | Status: DC | PRN

## 2014-06-11 MED ORDER — PROCHLORPERAZINE MALEATE 10 MG PO TABS: 10.0000 mg | ORAL_TABLET | Freq: Four times a day (QID) | ORAL | Status: DC | PRN

## 2014-06-11 NOTE — Assessment & Plan Note (Signed)
Clinically, he has no signs of and organ damage. His serum free light chains are getting a little worse. The patient is very anxious to go back on chemotherapy. I explained to him the risk and benefits of starting treatment now versus waiting a little longer but the patient is not interested to wait any longer. I would recommend weekly Velcade subcutaneous injection to start next week. In the meantime, he wants to do some research about clinical trials for amyloidosis. The patient is recommended to continue taking acyclovir for antimicrobial prophylaxis while on Velcade.

## 2014-06-11 NOTE — Telephone Encounter (Signed)
Gave pt appt for lab and MD , emailed Michelle regarding chemo °

## 2014-06-11 NOTE — Assessment & Plan Note (Signed)
The patient is on diuretics. He has excessive salt intake. His blood pressure is up and the patient have bilateral lower extremity edema. I recommend salt restriction and to consult with his cardiologist for medication adjustment.

## 2014-06-11 NOTE — Progress Notes (Signed)
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 He has significant evaluation including bone marrow aspirate and biopsy. 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 The patient subsequently went for maintain dense chemotherapy with Velcade for almost 2 years and then switch to Revlimid. He self discontinue Revlimid in May of 2014 due to fatigue.  INTERVAL HISTORY: Johnny Mitchell 73 y.o. male returns for further follow-up.  Please see below for problem oriented charting. He complained of bilateral lower extremity edema. He has some easy bruising. The patient denies any recent signs or symptoms of bleeding such as spontaneous epistaxis, hematuria or hematochezia. He denies any chest pain or shortness of breath. 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 It is not bothering him.  REVIEW OF SYSTEMS:   Constitutional: Denies fevers, chills or abnormal weight loss Eyes: Denies blurriness of vision Ears, nose, mouth, throat, and face: Denies mucositis or sore throat Respiratory: Denies cough, dyspnea or wheezes Cardiovascular: Denies palpitation, chest discomfort  Gastrointestinal:  Denies nausea, heartburn or change in bowel habits Skin: Denies abnormal skin rashes Lymphatics: Denies new lymphadenopathy Neurological:Denies numbness, tingling or new weaknesses Behavioral/Psych: Mood is stable, no new changes  All other systems were  reviewed with the patient and are negative.  I have reviewed the past medical history, past surgical history, social history and family history with the patient and they are unchanged from previous note.  ALLERGIES:  has No Known Allergies.  MEDICATIONS:  Current Outpatient Prescriptions  Medication Sig Dispense Refill  . acyclovir (ZOVIRAX) 400 MG tablet Take 400 mg by mouth once.      Marland Kitchen amiodarone (PACERONE) 200 MG tablet Take 1 tablet (200 mg total) by mouth daily.  30 tablet  11  . furosemide (LASIX) 40 MG tablet Take 1.5 tablets (60 mg total) by mouth daily.  30 tablet  11  . nitroGLYCERIN (NITROSTAT) 0.4 MG SL tablet Place 1 tablet (0.4 mg total) under the tongue every 5 (five) minutes as needed for chest pain.  25 tablet  12  . pravastatin (PRAVACHOL) 40 MG tablet Take 1 tablet (40 mg total) by mouth every evening.  30 tablet  11  . rivaroxaban (XARELTO) 20 MG TABS tablet Take 1 tablet (20 mg total) by mouth daily with supper.  90 tablet  3   No current facility-administered medications for this visit.    PHYSICAL EXAMINATION: ECOG PERFORMANCE STATUS: 1 - Symptomatic but completely ambulatory  Filed Vitals:   06/11/14 0847  BP: 159/80  Pulse: 73  Temp: 97.6 F (36.4 C)  Resp: 20   Filed Weights   06/11/14 0847  Weight: 279 lb 14.4 oz (126.962 kg)    GENERAL:alert, no distress and comfortable SKIN: skin color, texture, turgor are normal, no rashes or significant lesions. Noticed skin bruising EYES: normal, Conjunctiva are pink and looks mildly injected, noted bruising around his eyes OROPHARYNX:no exudate, no erythema and lips, buccal mucosa, and tongue normal  HEART: moderate bilateral lower extremity edema Musculoskeletal:no cyanosis of digits and no clubbing  NEURO:  alert & oriented x 3 with fluent speech, no focal motor/sensory deficits  LABORATORY DATA:  I have reviewed the data as listed    Component Value Date/Time   NA 138 06/10/2014 1221   NA 144  06/04/2014 0853   K 3.6 06/10/2014 1221   K 4.0 06/04/2014 0853   CL 102 06/10/2014 1221   CL 103 05/31/2013 0842   CO2 28 06/10/2014 1221   CO2 29 06/04/2014 0853   GLUCOSE 150* 06/10/2014 1221   GLUCOSE 103 06/04/2014 0853   GLUCOSE 118* 05/31/2013 0842   BUN 18 06/10/2014 1221   BUN 14.3 06/04/2014 0853   CREATININE 1.13 06/10/2014 1221   CREATININE 1.3 06/04/2014 0853   CREATININE 1.1 01/31/2014 1613   CREATININE 0.92 12/17/2011 1606   CALCIUM 8.3* 06/10/2014 1221   CALCIUM 8.9 06/04/2014 0853   PROT 6.0 06/10/2014 1221   PROT 6.7 06/04/2014 0853   ALBUMIN 3.6 06/10/2014 1221   ALBUMIN 3.4* 06/04/2014 0853   AST 18 06/10/2014 1221   AST 20 06/04/2014 0853   ALT 10 06/10/2014 1221   ALT 14 06/04/2014 0853   ALKPHOS 87 06/10/2014 1221   ALKPHOS 116 06/04/2014 0853   BILITOT 0.8 06/10/2014 1221   BILITOT 0.60 06/04/2014 0853   GFRNONAA 63 06/10/2014 1221   GFRNONAA 45* 06/01/2013 0548   GFRAA 73 06/10/2014 1221   GFRAA 52* 06/01/2013 0548    No results found for this basename: SPEP,  UPEP,   kappa and lambda light chains    Lab Results  Component Value Date   WBC 8.2 06/04/2014   NEUTROABS 6.4 06/04/2014   HGB 14.8 06/04/2014   HCT 45.9 06/04/2014   MCV 98.4* 06/04/2014   PLT 193 06/04/2014      Chemistry      Component Value Date/Time   NA 138 06/10/2014 1221   NA 144 06/04/2014 0853   K 3.6 06/10/2014 1221   K 4.0 06/04/2014 0853   CL 102 06/10/2014 1221   CL 103 05/31/2013 0842   CO2 28 06/10/2014 1221   CO2 29 06/04/2014 0853   BUN 18 06/10/2014 1221   BUN 14.3 06/04/2014 0853   CREATININE 1.13 06/10/2014 1221   CREATININE 1.3 06/04/2014 0853   CREATININE 1.1 01/31/2014 1613   CREATININE 0.92 12/17/2011 1606      Component Value Date/Time   CALCIUM 8.3* 06/10/2014 1221   CALCIUM 8.9 06/04/2014 0853   ALKPHOS 87 06/10/2014 1221   ALKPHOS 116 06/04/2014 0853   AST 18 06/10/2014 1221   AST 20 06/04/2014 0853   ALT 10 06/10/2014 1221   ALT 14 06/04/2014 0853   BILITOT 0.8 06/10/2014 1221   BILITOT 0.60  06/04/2014 0853      ASSESSMENT & PLAN:  AMYLOIDOSIS UNSPECIFIED Clinically, he has no signs of and organ damage. His serum free light chains are getting a little worse. The patient is very anxious to go back on chemotherapy. I explained to him the risk and benefits of starting treatment now versus waiting a little longer but the patient is not interested to wait any longer. I would recommend weekly Velcade subcutaneous injection to start next week. In the meantime, he wants to do some research about clinical trials for amyloidosis. The patient is recommended to continue taking acyclovir for antimicrobial prophylaxis while on Velcade.  Chronic diastolic heart failure The patient is on diuretics. He has excessive salt intake. His blood pressure is up and the patient have bilateral lower extremity edema. I  recommend salt restriction and to consult with his cardiologist for medication adjustment.  We discussed the role of chemotherapy. The intent is for palliative.  We discussed some of the risks, benefits, side-effects of Velcade.   Some of the short term side-effects included, though not limited to, risk of fatigue, weight loss, pancytopenia, life-threatening infections, skin rash, allergic reactions, need for transfusions of blood products, reactivation of viral infections, nausea, vomiting, change in bowel habits, admission to hospital for various reasons, and risks of death.   Long term side-effects are also discussed including risks of infertility, permanent damage to nerve function, chronic fatigue.   The patient is aware that the response rates discussed earlier is not guaranteed.    After a long discussion, patient made an informed decision to proceed with the prescribed plan of care.    Orders Placed This Encounter  Procedures  . CBC with Differential    Standing Status: Standing     Number of Occurrences: 33     Standing Expiration Date: 06/12/2015  . Comprehensive  metabolic panel    Standing Status: Standing     Number of Occurrences: 33     Standing Expiration Date: 06/12/2015   All questions were answered. The patient knows to call the clinic with any problems, questions or concerns. No barriers to learning was detected. I spent 40 minutes counseling the patient face to face. The total time spent in the appointment was 55 minutes and more than 50% was on counseling and review of test results     [facility], [place], MD 06/11/2014 10:28 AM

## 2014-06-11 NOTE — Telephone Encounter (Signed)
Per [facility] scheduled appts.

## 2014-06-14 ENCOUNTER — Telehealth: Payer: Self-pay | Admitting: Hematology and Oncology

## 2014-06-14 NOTE — Telephone Encounter (Signed)
Called pt,left message regarding chemo on 7/8, reminded pt to get appt calendar for July and August 2015

## 2014-06-19 ENCOUNTER — Ambulatory Visit (HOSPITAL_BASED_OUTPATIENT_CLINIC_OR_DEPARTMENT_OTHER): Payer: Medicare Other

## 2014-06-19 VITALS — BP 167/88 | HR 82 | Temp 97.3°F | Resp 19

## 2014-06-19 DIAGNOSIS — Z5112 Encounter for antineoplastic immunotherapy: Secondary | ICD-10-CM

## 2014-06-19 DIAGNOSIS — E859 Amyloidosis, unspecified: Secondary | ICD-10-CM

## 2014-06-19 MED ORDER — ONDANSETRON HCL 8 MG PO TABS
ORAL_TABLET | ORAL | Status: AC
  Filled 2014-06-19: qty 1

## 2014-06-19 MED ORDER — BORTEZOMIB CHEMO SQ INJECTION 3.5 MG (2.5MG/ML)
1.3000 mg/m2 | Freq: Once | INTRAMUSCULAR | Status: AC
  Administered 2014-06-19: 3.25 mg via SUBCUTANEOUS
  Filled 2014-06-19: qty 3.25

## 2014-06-19 MED ORDER — ONDANSETRON HCL 8 MG PO TABS
8.0000 mg | ORAL_TABLET | Freq: Once | ORAL | Status: AC
  Administered 2014-06-19: 8 mg via ORAL

## 2014-06-19 NOTE — Patient Instructions (Addendum)
[facility] Discharge Instructions for Patients Receiving Chemotherapy  Today you received the following chemotherapy agents: Velcade  To help prevent nausea and vomiting after your treatment, we encourage you to take your nausea medication as prescribed by your physician.    If you develop nausea and vomiting that is not controlled by your nausea medication, call the clinic.   BELOW ARE SYMPTOMS THAT SHOULD BE REPORTED IMMEDIATELY:  *FEVER GREATER THAN 100.5 F  *CHILLS WITH OR WITHOUT FEVER  NAUSEA AND VOMITING THAT IS NOT CONTROLLED WITH YOUR NAUSEA MEDICATION  *UNUSUAL SHORTNESS OF BREATH  *UNUSUAL BRUISING OR BLEEDING  TENDERNESS IN MOUTH AND THROAT WITH OR WITHOUT PRESENCE OF ULCERS  *URINARY PROBLEMS  *BOWEL PROBLEMS  UNUSUAL RASH Items with * indicate a potential emergency and should be followed up as soon as possible.  Feel free to call the clinic you have any questions or concerns. The clinic phone number is [phone].   Bortezomib injection What is this medicine? BORTEZOMIB (bor TEZ oh mib) is a chemotherapy drug. It slows the growth of cancer cells. This medicine is used to treat multiple myeloma, and certain lymphomas, such as mantle-cell lymphoma. This medicine may be used for other purposes; ask your health care provider or pharmacist if you have questions. COMMON BRAND NAME(S): Velcade What should I tell my health care provider before I take this medicine? They need to know if you have any of these conditions: -diabetes -heart disease -irregular heartbeat -liver disease -on hemodialysis -low blood counts, like low white blood cells, platelets, or hemoglobin -peripheral neuropathy -taking medicine for blood pressure -an unusual or allergic reaction to bortezomib, mannitol, boron, other medicines, foods, dyes, or preservatives -pregnant or trying to get pregnant -breast-feeding How should I use this medicine? This medicine is for  injection into a vein or for injection under the skin. It is given by a health care professional in a hospital or clinic setting. Talk to your pediatrician regarding the use of this medicine in children. Special care may be needed. Overdosage: If you think you have taken too much of this medicine contact a poison control center or emergency room at once. NOTE: This medicine is only for you. Do not share this medicine with others. What if I miss a dose? It is important not to miss your dose. Call your doctor or health care professional if you are unable to keep an appointment. What may interact with this medicine? This medicine may interact with the following medications: -ketoconazole -rifampin -ritonavir -St. John's Wort This list may not describe all possible interactions. Give your health care provider a list of all the medicines, herbs, non-prescription drugs, or dietary supplements you use. Also tell them if you smoke, drink alcohol, or use illegal drugs. Some items may interact with your medicine. What should I watch for while using this medicine? Visit your doctor for checks on your progress. This drug may make you feel generally unwell. This is not uncommon, as chemotherapy can affect healthy cells as well as cancer cells. Report any side effects. Continue your course of treatment even though you feel ill unless your doctor tells you to stop. You may get drowsy or dizzy. Do not drive, use machinery, or do anything that needs mental alertness until you know how this medicine affects you. Do not stand or sit up quickly, especially if you are an older patient. This reduces the risk of dizzy or fainting spells. In some cases, you may be given additional medicines  to help with side effects. Follow all directions for their use. Call your doctor or health care professional for advice if you get a fever, chills or sore throat, or other symptoms of a cold or flu. Do not treat yourself. This drug  decreases your body's ability to fight infections. Try to avoid being around people who are sick. This medicine may increase your risk to bruise or bleed. Call your doctor or health care professional if you notice any unusual bleeding. You may need blood work done while you are taking this medicine. In some patients, this medicine may cause a serious brain infection that may cause death. If you have any problems seeing, thinking, speaking, walking, or standing, tell your doctor right away. If you cannot reach your doctor, urgently seek other source of medical care. Do not become pregnant while taking this medicine. Women should inform their doctor if they wish to become pregnant or think they might be pregnant. There is a potential for serious side effects to an unborn child. Talk to your health care professional or pharmacist for more information. Do not breast-feed an infant while taking this medicine. Check with your doctor or health care professional if you get an attack of severe diarrhea, nausea and vomiting, or if you sweat a lot. The loss of too much body fluid can make it dangerous for you to take this medicine. What side effects may I notice from receiving this medicine? Side effects that you should report to your doctor or health care professional as soon as possible: -allergic reactions like skin rash, itching or hives, swelling of the face, lips, or tongue -breathing problems -changes in hearing -changes in vision -fast, irregular heartbeat -feeling faint or lightheaded, falls -pain, tingling, numbness in the hands or feet -right upper belly pain -seizures -swelling of the ankles, feet, hands -unusual bleeding or bruising -unusually weak or tired -vomiting -yellowing of the eyes or skin Side effects that usually do not require medical attention (report to your doctor or health care professional if they continue or are bothersome): -changes in emotions or  moods -constipation -diarrhea -loss of appetite -headache -irritation at site where injected -nausea This list may not describe all possible side effects. Call your doctor for medical advice about side effects. You may report side effects to FDA at 1-800-FDA-1088. Where should I keep my medicine? This drug is given in a hospital or clinic and will not be stored at home. NOTE: This sheet is a summary. It may not cover all possible information. If you have questions about this medicine, talk to your doctor, pharmacist, or health care provider.  2015, [employer] Standard. (2013-09-24 12:46:32)

## 2014-06-19 NOTE — Progress Notes (Signed)
Ok to use labs from 6/23 per treatment plan.

## 2014-06-26 ENCOUNTER — Other Ambulatory Visit: Payer: Self-pay | Admitting: Hematology and Oncology

## 2014-06-26 ENCOUNTER — Ambulatory Visit (HOSPITAL_BASED_OUTPATIENT_CLINIC_OR_DEPARTMENT_OTHER): Payer: Medicare Other

## 2014-06-26 ENCOUNTER — Ambulatory Visit: Payer: Medicare Other

## 2014-06-26 ENCOUNTER — Other Ambulatory Visit: Payer: Medicare Other

## 2014-06-26 VITALS — BP 157/73 | HR 73 | Temp 97.9°F | Resp 20

## 2014-06-26 DIAGNOSIS — E859 Amyloidosis, unspecified: Secondary | ICD-10-CM | POA: Diagnosis not present

## 2014-06-26 DIAGNOSIS — Z5112 Encounter for antineoplastic immunotherapy: Secondary | ICD-10-CM | POA: Diagnosis not present

## 2014-06-26 LAB — COMPREHENSIVE METABOLIC PANEL (CC13)
ALT: 15 U/L (ref 0–55)
ANION GAP: 9 meq/L (ref 3–11)
AST: 20 U/L (ref 5–34)
Albumin: 3.5 g/dL (ref 3.5–5.0)
Alkaline Phosphatase: 112 U/L (ref 40–150)
BUN: 18.2 mg/dL (ref 7.0–26.0)
CALCIUM: 9.2 mg/dL (ref 8.4–10.4)
CHLORIDE: 106 meq/L (ref 98–109)
CO2: 27 mEq/L (ref 22–29)
Creatinine: 1.4 mg/dL — ABNORMAL HIGH (ref 0.7–1.3)
Glucose: 158 mg/dl — ABNORMAL HIGH (ref 70–140)
Potassium: 3.9 mEq/L (ref 3.5–5.1)
SODIUM: 142 meq/L (ref 136–145)
TOTAL PROTEIN: 7 g/dL (ref 6.4–8.3)
Total Bilirubin: 0.63 mg/dL (ref 0.20–1.20)

## 2014-06-26 LAB — CBC WITH DIFFERENTIAL/PLATELET
BASO%: 0.2 % (ref 0.0–2.0)
Basophils Absolute: 0 10*3/uL (ref 0.0–0.1)
EOS%: 0 % (ref 0.0–7.0)
Eosinophils Absolute: 0 10*3/uL (ref 0.0–0.5)
HEMATOCRIT: 45.3 % (ref 38.4–49.9)
HGB: 14.7 g/dL (ref 13.0–17.1)
LYMPH%: 4 % — ABNORMAL LOW (ref 14.0–49.0)
MCH: 32 pg (ref 27.2–33.4)
MCHC: 32.5 g/dL (ref 32.0–36.0)
MCV: 98.4 fL — ABNORMAL HIGH (ref 79.3–98.0)
MONO#: 0.1 10*3/uL (ref 0.1–0.9)
MONO%: 0.7 % (ref 0.0–14.0)
NEUT#: 10.4 10*3/uL — ABNORMAL HIGH (ref 1.5–6.5)
NEUT%: 95.1 % — ABNORMAL HIGH (ref 39.0–75.0)
Platelets: 205 10*3/uL (ref 140–400)
RBC: 4.6 10*6/uL (ref 4.20–5.82)
RDW: 13.9 % (ref 11.0–14.6)
WBC: 11 10*3/uL — AB (ref 4.0–10.3)
lymph#: 0.4 10*3/uL — ABNORMAL LOW (ref 0.9–3.3)

## 2014-06-26 MED ORDER — ONDANSETRON HCL 8 MG PO TABS
ORAL_TABLET | ORAL | Status: AC
  Filled 2014-06-26: qty 1

## 2014-06-26 MED ORDER — ONDANSETRON HCL 8 MG PO TABS
8.0000 mg | ORAL_TABLET | Freq: Once | ORAL | Status: AC
  Administered 2014-06-26: 8 mg via ORAL

## 2014-06-26 MED ORDER — BORTEZOMIB CHEMO SQ INJECTION 3.5 MG (2.5MG/ML)
1.3000 mg/m2 | Freq: Once | INTRAMUSCULAR | Status: AC
  Administered 2014-06-26: 3.25 mg via SUBCUTANEOUS
  Filled 2014-06-26: qty 3.25

## 2014-06-26 NOTE — Patient Instructions (Signed)
Bortezomib injection What is this medicine? BORTEZOMIB (bor TEZ oh mib) is a chemotherapy drug. It slows the growth of cancer cells. This medicine is used to treat multiple myeloma, and certain lymphomas, such as mantle-cell lymphoma. This medicine may be used for other purposes; ask your health care provider or pharmacist if you have questions. COMMON BRAND NAME(S): Velcade What should I tell my health care provider before I take this medicine? They need to know if you have any of these conditions: -diabetes -heart disease -irregular heartbeat -liver disease -on hemodialysis -low blood counts, like low white blood cells, platelets, or hemoglobin -peripheral neuropathy -taking medicine for blood pressure -an unusual or allergic reaction to bortezomib, mannitol, boron, other medicines, foods, dyes, or preservatives -pregnant or trying to get pregnant -breast-feeding How should I use this medicine? This medicine is for injection into a vein or for injection under the skin. It is given by a health care professional in a hospital or clinic setting. Talk to your pediatrician regarding the use of this medicine in children. Special care may be needed. Overdosage: If you think you have taken too much of this medicine contact a poison control center or emergency room at once. NOTE: This medicine is only for you. Do not share this medicine with others. What if I miss a dose? It is important not to miss your dose. Call your doctor or health care professional if you are unable to keep an appointment. What may interact with this medicine? This medicine may interact with the following medications: -ketoconazole -rifampin -ritonavir -St. John's Wort This list may not describe all possible interactions. Give your health care provider a list of all the medicines, herbs, non-prescription drugs, or dietary supplements you use. Also tell them if you smoke, drink alcohol, or use illegal drugs. Some items  may interact with your medicine. What should I watch for while using this medicine? Visit your doctor for checks on your progress. This drug may make you feel generally unwell. This is not uncommon, as chemotherapy can affect healthy cells as well as cancer cells. Report any side effects. Continue your course of treatment even though you feel ill unless your doctor tells you to stop. You may get drowsy or dizzy. Do not drive, use machinery, or do anything that needs mental alertness until you know how this medicine affects you. Do not stand or sit up quickly, especially if you are an older patient. This reduces the risk of dizzy or fainting spells. In some cases, you may be given additional medicines to help with side effects. Follow all directions for their use. Call your doctor or health care professional for advice if you get a fever, chills or sore throat, or other symptoms of a cold or flu. Do not treat yourself. This drug decreases your body's ability to fight infections. Try to avoid being around people who are sick. This medicine may increase your risk to bruise or bleed. Call your doctor or health care professional if you notice any unusual bleeding. You may need blood work done while you are taking this medicine. In some patients, this medicine may cause a serious brain infection that may cause death. If you have any problems seeing, thinking, speaking, walking, or standing, tell your doctor right away. If you cannot reach your doctor, urgently seek other source of medical care. Do not become pregnant while taking this medicine. Women should inform their doctor if they wish to become pregnant or think they might be pregnant. There is   a potential for serious side effects to an unborn child. Talk to your health care professional or pharmacist for more information. Do not breast-feed an infant while taking this medicine. Check with your doctor or health care professional if you get an attack of  severe diarrhea, nausea and vomiting, or if you sweat a lot. The loss of too much body fluid can make it dangerous for you to take this medicine. What side effects may I notice from receiving this medicine? Side effects that you should report to your doctor or health care professional as soon as possible: -allergic reactions like skin rash, itching or hives, swelling of the face, lips, or tongue -breathing problems -changes in hearing -changes in vision -fast, irregular heartbeat -feeling faint or lightheaded, falls -pain, tingling, numbness in the hands or feet -right upper belly pain -seizures -swelling of the ankles, feet, hands -unusual bleeding or bruising -unusually weak or tired -vomiting -yellowing of the eyes or skin Side effects that usually do not require medical attention (report to your doctor or health care professional if they continue or are bothersome): -changes in emotions or moods -constipation -diarrhea -loss of appetite -headache -irritation at site where injected -nausea This list may not describe all possible side effects. Call your doctor for medical advice about side effects. You may report side effects to FDA at 1-800-FDA-1088. Where should I keep my medicine? This drug is given in a hospital or clinic and will not be stored at home. NOTE: This sheet is a summary. It may not cover all possible information. If you have questions about this medicine, talk to your doctor, pharmacist, or health care provider.  2015, [employer] Standard. (2013-09-24 12:46:32)  

## 2014-07-02 ENCOUNTER — Encounter: Payer: Self-pay | Admitting: *Deleted

## 2014-07-03 ENCOUNTER — Other Ambulatory Visit (HOSPITAL_BASED_OUTPATIENT_CLINIC_OR_DEPARTMENT_OTHER): Payer: Medicare Other

## 2014-07-03 ENCOUNTER — Ambulatory Visit (HOSPITAL_BASED_OUTPATIENT_CLINIC_OR_DEPARTMENT_OTHER): Payer: Medicare Other

## 2014-07-03 VITALS — BP 169/77 | HR 71 | Temp 98.3°F

## 2014-07-03 DIAGNOSIS — Z5112 Encounter for antineoplastic immunotherapy: Secondary | ICD-10-CM

## 2014-07-03 DIAGNOSIS — E859 Amyloidosis, unspecified: Secondary | ICD-10-CM

## 2014-07-03 LAB — CBC WITH DIFFERENTIAL/PLATELET
BASO%: 0.3 % (ref 0.0–2.0)
BASOS ABS: 0 10*3/uL (ref 0.0–0.1)
EOS%: 0.2 % (ref 0.0–7.0)
Eosinophils Absolute: 0 10*3/uL (ref 0.0–0.5)
HCT: 45.8 % (ref 38.4–49.9)
HEMOGLOBIN: 14.9 g/dL (ref 13.0–17.1)
LYMPH#: 0.7 10*3/uL — AB (ref 0.9–3.3)
LYMPH%: 6.9 % — ABNORMAL LOW (ref 14.0–49.0)
MCH: 31.9 pg (ref 27.2–33.4)
MCHC: 32.6 g/dL (ref 32.0–36.0)
MCV: 97.8 fL (ref 79.3–98.0)
MONO#: 0.4 10*3/uL (ref 0.1–0.9)
MONO%: 3.5 % (ref 0.0–14.0)
NEUT#: 9.3 10*3/uL — ABNORMAL HIGH (ref 1.5–6.5)
NEUT%: 89.1 % — ABNORMAL HIGH (ref 39.0–75.0)
Platelets: 194 10*3/uL (ref 140–400)
RBC: 4.68 10*6/uL (ref 4.20–5.82)
RDW: 14.2 % (ref 11.0–14.6)
WBC: 10.4 10*3/uL — ABNORMAL HIGH (ref 4.0–10.3)

## 2014-07-03 LAB — COMPREHENSIVE METABOLIC PANEL (CC13)
ALK PHOS: 100 U/L (ref 40–150)
ALT: 18 U/L (ref 0–55)
AST: 17 U/L (ref 5–34)
Albumin: 3.3 g/dL — ABNORMAL LOW (ref 3.5–5.0)
Anion Gap: 9 mEq/L (ref 3–11)
BUN: 19.8 mg/dL (ref 7.0–26.0)
CHLORIDE: 107 meq/L (ref 98–109)
CO2: 26 mEq/L (ref 22–29)
Calcium: 9 mg/dL (ref 8.4–10.4)
Creatinine: 1.3 mg/dL (ref 0.7–1.3)
Glucose: 121 mg/dl (ref 70–140)
Potassium: 4.3 mEq/L (ref 3.5–5.1)
Sodium: 141 mEq/L (ref 136–145)
Total Bilirubin: 0.53 mg/dL (ref 0.20–1.20)
Total Protein: 6.6 g/dL (ref 6.4–8.3)

## 2014-07-03 MED ORDER — ONDANSETRON HCL 8 MG PO TABS
ORAL_TABLET | ORAL | Status: AC
  Filled 2014-07-03: qty 1

## 2014-07-03 MED ORDER — ONDANSETRON HCL 8 MG PO TABS
8.0000 mg | ORAL_TABLET | Freq: Once | ORAL | Status: AC
  Administered 2014-07-03: 8 mg via ORAL

## 2014-07-03 MED ORDER — BORTEZOMIB CHEMO SQ INJECTION 3.5 MG (2.5MG/ML)
1.3000 mg/m2 | Freq: Once | INTRAMUSCULAR | Status: AC
  Administered 2014-07-03: 3.25 mg via SUBCUTANEOUS
  Filled 2014-07-03: qty 3.25

## 2014-07-03 NOTE — Patient Instructions (Signed)
[facility] Discharge Instructions for Patients Receiving Chemotherapy  Today you received the following chemotherapy agents velcade   To help prevent nausea and vomiting after your treatment, we encourage you to take your nausea medication as directed  If you develop nausea and vomiting that is not controlled by your nausea medication, call the clinic.   BELOW ARE SYMPTOMS THAT SHOULD BE REPORTED IMMEDIATELY:  *FEVER GREATER THAN 100.5 F  *CHILLS WITH OR WITHOUT FEVER  NAUSEA AND VOMITING THAT IS NOT CONTROLLED WITH YOUR NAUSEA MEDICATION  *UNUSUAL SHORTNESS OF BREATH  *UNUSUAL BRUISING OR BLEEDING  TENDERNESS IN MOUTH AND THROAT WITH OR WITHOUT PRESENCE OF ULCERS  *URINARY PROBLEMS  *BOWEL PROBLEMS  UNUSUAL RASH Items with * indicate a potential emergency and should be followed up as soon as possible.  Feel free to call the clinic you have any questions or concerns. The clinic phone number is [phone].  

## 2014-07-08 ENCOUNTER — Encounter: Payer: Self-pay | Admitting: Pulmonary Disease

## 2014-07-10 ENCOUNTER — Other Ambulatory Visit (HOSPITAL_BASED_OUTPATIENT_CLINIC_OR_DEPARTMENT_OTHER): Payer: Medicare Other

## 2014-07-10 ENCOUNTER — Ambulatory Visit (HOSPITAL_BASED_OUTPATIENT_CLINIC_OR_DEPARTMENT_OTHER): Payer: Medicare Other

## 2014-07-10 VITALS — BP 134/72 | HR 69 | Temp 98.4°F

## 2014-07-10 DIAGNOSIS — E859 Amyloidosis, unspecified: Secondary | ICD-10-CM

## 2014-07-10 DIAGNOSIS — Z5112 Encounter for antineoplastic immunotherapy: Secondary | ICD-10-CM | POA: Diagnosis not present

## 2014-07-10 LAB — COMPREHENSIVE METABOLIC PANEL (CC13)
ALK PHOS: 93 U/L (ref 40–150)
ALT: 14 U/L (ref 0–55)
AST: 18 U/L (ref 5–34)
Albumin: 3.1 g/dL — ABNORMAL LOW (ref 3.5–5.0)
Anion Gap: 9 mEq/L (ref 3–11)
BILIRUBIN TOTAL: 0.79 mg/dL (ref 0.20–1.20)
BUN: 27.3 mg/dL — ABNORMAL HIGH (ref 7.0–26.0)
CO2: 27 mEq/L (ref 22–29)
CREATININE: 1.2 mg/dL (ref 0.7–1.3)
Calcium: 8.9 mg/dL (ref 8.4–10.4)
Chloride: 107 mEq/L (ref 98–109)
Glucose: 128 mg/dl (ref 70–140)
Potassium: 4.3 mEq/L (ref 3.5–5.1)
SODIUM: 142 meq/L (ref 136–145)
TOTAL PROTEIN: 6.5 g/dL (ref 6.4–8.3)

## 2014-07-10 LAB — CBC WITH DIFFERENTIAL/PLATELET
BASO%: 0.7 % (ref 0.0–2.0)
Basophils Absolute: 0.1 10*3/uL (ref 0.0–0.1)
EOS%: 2.3 % (ref 0.0–7.0)
Eosinophils Absolute: 0.2 10*3/uL (ref 0.0–0.5)
HCT: 46.6 % (ref 38.4–49.9)
HGB: 14.9 g/dL (ref 13.0–17.1)
LYMPH%: 11.3 % — AB (ref 14.0–49.0)
MCH: 31.3 pg (ref 27.2–33.4)
MCHC: 32 g/dL (ref 32.0–36.0)
MCV: 97.7 fL (ref 79.3–98.0)
MONO#: 0.8 10*3/uL (ref 0.1–0.9)
MONO%: 9 % (ref 0.0–14.0)
NEUT#: 6.6 10*3/uL — ABNORMAL HIGH (ref 1.5–6.5)
NEUT%: 76.7 % — AB (ref 39.0–75.0)
PLATELETS: 180 10*3/uL (ref 140–400)
RBC: 4.76 10*6/uL (ref 4.20–5.82)
RDW: 14 % (ref 11.0–14.6)
WBC: 8.6 10*3/uL (ref 4.0–10.3)
lymph#: 1 10*3/uL (ref 0.9–3.3)

## 2014-07-10 MED ORDER — BORTEZOMIB CHEMO SQ INJECTION 3.5 MG (2.5MG/ML)
1.3000 mg/m2 | Freq: Once | INTRAMUSCULAR | Status: AC
  Administered 2014-07-10: 3.25 mg via SUBCUTANEOUS
  Filled 2014-07-10: qty 3.25

## 2014-07-10 MED ORDER — ONDANSETRON HCL 8 MG PO TABS
ORAL_TABLET | ORAL | Status: AC
  Filled 2014-07-10: qty 1

## 2014-07-10 MED ORDER — ONDANSETRON HCL 8 MG PO TABS
8.0000 mg | ORAL_TABLET | Freq: Once | ORAL | Status: AC
  Administered 2014-07-10: 8 mg via ORAL

## 2014-07-12 DIAGNOSIS — J328 Other chronic sinusitis: Secondary | ICD-10-CM | POA: Diagnosis not present

## 2014-07-17 ENCOUNTER — Telehealth: Payer: Self-pay | Admitting: Hematology and Oncology

## 2014-07-17 ENCOUNTER — Ambulatory Visit (HOSPITAL_BASED_OUTPATIENT_CLINIC_OR_DEPARTMENT_OTHER): Payer: Medicare Other

## 2014-07-17 ENCOUNTER — Other Ambulatory Visit (HOSPITAL_BASED_OUTPATIENT_CLINIC_OR_DEPARTMENT_OTHER): Payer: Medicare Other

## 2014-07-17 ENCOUNTER — Ambulatory Visit (HOSPITAL_BASED_OUTPATIENT_CLINIC_OR_DEPARTMENT_OTHER): Payer: Medicare Other | Admitting: Hematology and Oncology

## 2014-07-17 ENCOUNTER — Encounter: Payer: Self-pay | Admitting: Hematology and Oncology

## 2014-07-17 VITALS — BP 152/74 | HR 72 | Temp 98.9°F | Resp 19 | Ht 71.0 in | Wt 269.5 lb

## 2014-07-17 DIAGNOSIS — Z5112 Encounter for antineoplastic immunotherapy: Secondary | ICD-10-CM | POA: Diagnosis not present

## 2014-07-17 DIAGNOSIS — E859 Amyloidosis, unspecified: Secondary | ICD-10-CM

## 2014-07-17 DIAGNOSIS — R609 Edema, unspecified: Secondary | ICD-10-CM

## 2014-07-17 DIAGNOSIS — D72829 Elevated white blood cell count, unspecified: Secondary | ICD-10-CM

## 2014-07-17 LAB — COMPREHENSIVE METABOLIC PANEL (CC13)
ALT: 18 U/L (ref 0–55)
ANION GAP: 9 meq/L (ref 3–11)
AST: 21 U/L (ref 5–34)
Albumin: 3.3 g/dL — ABNORMAL LOW (ref 3.5–5.0)
Alkaline Phosphatase: 94 U/L (ref 40–150)
BILIRUBIN TOTAL: 0.71 mg/dL (ref 0.20–1.20)
BUN: 23.8 mg/dL (ref 7.0–26.0)
CHLORIDE: 106 meq/L (ref 98–109)
CO2: 25 meq/L (ref 22–29)
CREATININE: 1.2 mg/dL (ref 0.7–1.3)
Calcium: 9.5 mg/dL (ref 8.4–10.4)
Glucose: 108 mg/dl (ref 70–140)
Potassium: 4.7 mEq/L (ref 3.5–5.1)
Sodium: 140 mEq/L (ref 136–145)
Total Protein: 6.7 g/dL (ref 6.4–8.3)

## 2014-07-17 LAB — CBC WITH DIFFERENTIAL/PLATELET
BASO%: 0.3 % (ref 0.0–2.0)
BASOS ABS: 0 10*3/uL (ref 0.0–0.1)
EOS%: 0.4 % (ref 0.0–7.0)
Eosinophils Absolute: 0.1 10*3/uL (ref 0.0–0.5)
HEMATOCRIT: 46.5 % (ref 38.4–49.9)
HEMOGLOBIN: 15 g/dL (ref 13.0–17.1)
LYMPH#: 1.1 10*3/uL (ref 0.9–3.3)
LYMPH%: 7.6 % — AB (ref 14.0–49.0)
MCH: 31.4 pg (ref 27.2–33.4)
MCHC: 32.2 g/dL (ref 32.0–36.0)
MCV: 97.6 fL (ref 79.3–98.0)
MONO#: 0.8 10*3/uL (ref 0.1–0.9)
MONO%: 5.6 % (ref 0.0–14.0)
NEUT#: 12.2 10*3/uL — ABNORMAL HIGH (ref 1.5–6.5)
NEUT%: 86.1 % — AB (ref 39.0–75.0)
PLATELETS: 199 10*3/uL (ref 140–400)
RBC: 4.76 10*6/uL (ref 4.20–5.82)
RDW: 14 % (ref 11.0–14.6)
WBC: 14.2 10*3/uL — AB (ref 4.0–10.3)

## 2014-07-17 MED ORDER — ONDANSETRON HCL 8 MG PO TABS
ORAL_TABLET | ORAL | Status: AC
  Filled 2014-07-17: qty 1

## 2014-07-17 MED ORDER — ONDANSETRON HCL 8 MG PO TABS
8.0000 mg | ORAL_TABLET | Freq: Once | ORAL | Status: AC
  Administered 2014-07-17: 8 mg via ORAL

## 2014-07-17 MED ORDER — BORTEZOMIB CHEMO SQ INJECTION 3.5 MG (2.5MG/ML)
1.3000 mg/m2 | Freq: Once | INTRAMUSCULAR | Status: AC
  Administered 2014-07-17: 3.25 mg via SUBCUTANEOUS
  Filled 2014-07-17: qty 3.25

## 2014-07-17 NOTE — Assessment & Plan Note (Signed)
This is likely due to dexamethasone. The patient has no signs of infection. I will observe.

## 2014-07-17 NOTE — Progress Notes (Signed)
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 He has significant evaluation including bone marrow aspirate and biopsy. 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 The patient subsequently went for maintain dense chemotherapy with Velcade for almost 2 years and then switch to Revlimid. He self discontinue Revlimid in May of 2014 due to fatigue. In July 2015, we restarted him back on Velcade due to a rising light chain. INTERVAL HISTORY: Please see below for problem oriented charting. He is seen prior to cycle 2 of treatment. He has started taking dexamethasone himself because he thought I forgot his prescription. He complains of bilateral leg edema and easy bruising. He denies side effects from peripheral neuropathy. REVIEW OF SYSTEMS:   Constitutional: Denies fevers, chills or abnormal weight loss Eyes: Denies blurriness of vision Ears, nose, mouth, throat, and face: Denies mucositis or sore throat Respiratory: Denies cough, dyspnea or wheezes Cardiovascular: Denies palpitation, chest discomfort  Gastrointestinal:  Denies nausea, heartburn or change in bowel habits Skin: Denies abnormal skin rashes Lymphatics: Denies new lymphadenopathy  Neurological:Denies numbness, tingling or new weaknesses Behavioral/Psych: Mood is stable, no new changes  All other systems were reviewed with the patient and are negative.  I have reviewed the past medical history, past surgical history, social  history and family history with the patient and they are unchanged from previous note.  ALLERGIES:  has No Known Allergies.  MEDICATIONS:  Current Outpatient Prescriptions  Medication Sig Dispense Refill  . acyclovir (ZOVIRAX) 400 MG tablet Take 400 mg by mouth once.      Marland Kitchen amiodarone (PACERONE) 200 MG tablet Take 1 tablet (200 mg total) by mouth daily.  30 tablet  11  . dexamethasone (DECADRON) 4 MG tablet Take 20 mg by mouth 2 (two) times daily with a meal.      . furosemide (LASIX) 40 MG tablet Take 1.5 tablets (60 mg total) by mouth daily.  30 tablet  11  . nitroGLYCERIN (NITROSTAT) 0.4 MG SL tablet Place 1 tablet (0.4 mg total) under the tongue every 5 (five) minutes as needed for chest pain.  25 tablet  12  . pravastatin (PRAVACHOL) 40 MG tablet Take 1 tablet (40 mg total) by mouth every evening.  30 tablet  11  . rivaroxaban (XARELTO) 20 MG TABS tablet Take 1 tablet (20 mg total) by mouth daily with supper.  90 tablet  3   No current facility-administered medications for this visit.    PHYSICAL EXAMINATION: ECOG PERFORMANCE STATUS: 0 - Asymptomatic  Filed Vitals:   07/17/14 0850  BP: 152/74  Pulse: 72  Temp: 98.9 F (37.2 C)  Resp: 19   Filed Weights   07/17/14 0850  Weight: 269 lb 8 oz (122.244 kg)    GENERAL:alert, no distress and comfortable SKIN: skin color, texture, turgor are normal, no rashes or significant lesions. No other chronic skin bruising EYES: normal, Conjunctiva are pink and non-injected, sclera clear OROPHARYNX:no exudate, no erythema and lips, buccal mucosa, and tongue normal  NECK: supple, thyroid normal size, non-tender, without nodularity LYMPH:  no palpable lymphadenopathy in the cervical,  axillary or inguinal LUNGS: clear to auscultation and percussion with normal breathing effort HEART: regular rate & rhythm and no murmurs moderate bilateral lower extremity edema ABDOMEN:abdomen soft, non-tender and normal bowel sounds Musculoskeletal:no  cyanosis of digits and no clubbing  NEURO: alert & oriented x 3 with fluent speech, no focal motor/sensory deficits  LABORATORY DATA:  I have reviewed the data as listed    Component Value Date/Time   NA 140 07/17/2014 0837   NA 138 06/10/2014 1221   K 4.7 07/17/2014 0837   K 3.6 06/10/2014 1221   CL 102 06/10/2014 1221   CL 103 05/31/2013 0842   CO2 25 07/17/2014 0837   CO2 28 06/10/2014 1221   GLUCOSE 108 07/17/2014 0837   GLUCOSE 150* 06/10/2014 1221   GLUCOSE 118* 05/31/2013 0842   BUN 23.8 07/17/2014 0837   BUN 18 06/10/2014 1221   CREATININE 1.2 07/17/2014 0837   CREATININE 1.13 06/10/2014 1221   CREATININE 1.1 01/31/2014 1613   CREATININE 0.92 12/17/2011 1606   CALCIUM 9.5 07/17/2014 0837   CALCIUM 8.3* 06/10/2014 1221   PROT 6.7 07/17/2014 0837   PROT 6.0 06/10/2014 1221   ALBUMIN 3.3* 07/17/2014 0837   ALBUMIN 3.6 06/10/2014 1221   AST 21 07/17/2014 0837   AST 18 06/10/2014 1221   ALT 18 07/17/2014 0837   ALT 10 06/10/2014 1221   ALKPHOS 94 07/17/2014 0837   ALKPHOS 87 06/10/2014 1221   BILITOT 0.71 07/17/2014 0837   BILITOT 0.8 06/10/2014 1221   GFRNONAA 63 06/10/2014 1221   GFRNONAA 45* 06/01/2013 0548   GFRAA 73 06/10/2014 1221   GFRAA 52* 06/01/2013 0548    No results found for this basename: SPEP,  UPEP,   kappa and lambda light chains    Lab Results  Component Value Date   WBC 14.2* 07/17/2014   NEUTROABS 12.2* 07/17/2014   HGB 15.0 07/17/2014   HCT 46.5 07/17/2014   MCV 97.6 07/17/2014   PLT 199 07/17/2014      Chemistry      Component Value Date/Time   NA 140 07/17/2014 0837   NA 138 06/10/2014 1221   K 4.7 07/17/2014 0837   K 3.6 06/10/2014 1221   CL 102 06/10/2014 1221   CL 103 05/31/2013 0842   CO2 25 07/17/2014 0837   CO2 28 06/10/2014 1221   BUN 23.8 07/17/2014 0837   BUN 18 06/10/2014 1221   CREATININE 1.2 07/17/2014 0837   CREATININE 1.13 06/10/2014 1221   CREATININE 1.1 01/31/2014 1613   CREATININE 0.92 12/17/2011 1606      Component Value Date/Time   CALCIUM 9.5 07/17/2014 0837   CALCIUM 8.3*  06/10/2014 1221   ALKPHOS 94 07/17/2014 0837   ALKPHOS 87 06/10/2014 1221   AST 21 07/17/2014 0837   AST 18 06/10/2014 1221   ALT 18 07/17/2014 0837   ALT 10 06/10/2014 1221   BILITOT 0.71 07/17/2014 0837   BILITOT 0.8 06/10/2014 1221      ASSESSMENT & PLAN:  AMYLOIDOSIS UNSPECIFIED Clinically, he has no signs of end organ damage. The patient is recommended to continue taking acyclovir for antimicrobial prophylaxis while on Velcade. He tolerates treatment well without any side effects so far. I would not recommend him to take dexamethasone due to the risk of worsening fluid retention and leg edema. Dexamethasone probably would contribute very little to the overall treatment of his amyloidosis.   Edema He has chronic bilateral leg edema from chronic diastolic heart failure. I recommend tight  control of blood pressure and to minimize salt intake if possible.  LEUKOCYTOSIS This is likely due to dexamethasone. The patient has no signs of infection. I will observe.   the patient wants to delay his treatment next week due to a trip. We will resume his treatment again every Wednesdays starting again on 07/31/2014.  Orders Placed This Encounter  Procedures  . SPEP & IFE with QIG    Standing Status: Future     Number of Occurrences:      Standing Expiration Date: 08/21/2015  . Kappa/lambda light chains    Standing Status: Future     Number of Occurrences:      Standing Expiration Date: 08/21/2015  . Beta 2 microglobulin, serum    Standing Status: Future     Number of Occurrences:      Standing Expiration Date: 08/21/2015   All questions were answered. The patient knows to call the clinic with any problems, questions or concerns. No barriers to learning was detected. I spent 25 minutes counseling the patient face to face. The total time spent in the appointment was 30 minutes and more than 50% was on counseling and review of test results     [facility], [place], MD 07/17/2014 11:48 PM

## 2014-07-17 NOTE — Telephone Encounter (Signed)
gv pt appt schedule for aug/sept °

## 2014-07-17 NOTE — Assessment & Plan Note (Signed)
Clinically, he has no signs of end organ damage. The patient is recommended to continue taking acyclovir for antimicrobial prophylaxis while on Velcade. He tolerates treatment well without any side effects so far. I would not recommend him to take dexamethasone due to the risk of worsening fluid retention and leg edema. Dexamethasone probably would contribute very little to the overall treatment of his amyloidosis.

## 2014-07-17 NOTE — Assessment & Plan Note (Signed)
He has chronic bilateral leg edema from chronic diastolic heart failure. I recommend tight control of blood pressure and to minimize salt intake if possible.

## 2014-07-17 NOTE — Patient Instructions (Signed)
[facility] Discharge Instructions for Patients Receiving Chemotherapy  Today you received the following chemotherapy agents: Velcade.  To help prevent nausea and vomiting after your treatment, we encourage you to take your nausea medication as prescribed.   If you develop nausea and vomiting that is not controlled by your nausea medication, call the clinic.   BELOW ARE SYMPTOMS THAT SHOULD BE REPORTED IMMEDIATELY:  *FEVER GREATER THAN 100.5 F  *CHILLS WITH OR WITHOUT FEVER  NAUSEA AND VOMITING THAT IS NOT CONTROLLED WITH YOUR NAUSEA MEDICATION  *UNUSUAL SHORTNESS OF BREATH  *UNUSUAL BRUISING OR BLEEDING  TENDERNESS IN MOUTH AND THROAT WITH OR WITHOUT PRESENCE OF ULCERS  *URINARY PROBLEMS  *BOWEL PROBLEMS  UNUSUAL RASH Items with * indicate a potential emergency and should be followed up as soon as possible.  Feel free to call the clinic you have any questions or concerns. The clinic phone number is [phone].    

## 2014-07-19 LAB — SPEP & IFE WITH QIG
Albumin ELP: 60.5 % (ref 55.8–66.1)
Alpha-1-Globulin: 6.5 % — ABNORMAL HIGH (ref 2.9–4.9)
Alpha-2-Globulin: 14.8 % — ABNORMAL HIGH (ref 7.1–11.8)
Beta 2: 3.1 % — ABNORMAL LOW (ref 3.2–6.5)
Beta Globulin: 4.8 % (ref 4.7–7.2)
GAMMA GLOBULIN: 10.3 % — AB (ref 11.1–18.8)
IGM, SERUM: 36 mg/dL — AB (ref 41–251)
IgA: 179 mg/dL (ref 68–379)
IgG (Immunoglobin G), Serum: 905 mg/dL (ref 650–1600)
Total Protein, Serum Electrophoresis: 6.4 g/dL (ref 6.0–8.3)

## 2014-07-19 LAB — KAPPA/LAMBDA LIGHT CHAINS
KAPPA FREE LGHT CHN: 3.12 mg/dL — AB (ref 0.33–1.94)
KAPPA LAMBDA RATIO: 2.18 — AB (ref 0.26–1.65)
Lambda Free Lght Chn: 1.43 mg/dL (ref 0.57–2.63)

## 2014-07-24 ENCOUNTER — Other Ambulatory Visit: Payer: Medicare Other

## 2014-07-24 ENCOUNTER — Ambulatory Visit: Payer: Medicare Other

## 2014-07-31 ENCOUNTER — Encounter: Payer: Self-pay | Admitting: Cardiology

## 2014-07-31 ENCOUNTER — Other Ambulatory Visit (HOSPITAL_BASED_OUTPATIENT_CLINIC_OR_DEPARTMENT_OTHER): Payer: Medicare Other

## 2014-07-31 ENCOUNTER — Ambulatory Visit (HOSPITAL_BASED_OUTPATIENT_CLINIC_OR_DEPARTMENT_OTHER): Payer: Medicare Other

## 2014-07-31 ENCOUNTER — Other Ambulatory Visit: Payer: Self-pay | Admitting: Hematology and Oncology

## 2014-07-31 VITALS — BP 137/63 | HR 61 | Temp 97.4°F | Resp 18

## 2014-07-31 DIAGNOSIS — E859 Amyloidosis, unspecified: Secondary | ICD-10-CM

## 2014-07-31 DIAGNOSIS — Z5112 Encounter for antineoplastic immunotherapy: Secondary | ICD-10-CM

## 2014-07-31 LAB — CBC WITH DIFFERENTIAL/PLATELET
BASO%: 0.8 % (ref 0.0–2.0)
Basophils Absolute: 0.1 10*3/uL (ref 0.0–0.1)
EOS%: 0.5 % (ref 0.0–7.0)
Eosinophils Absolute: 0 10*3/uL (ref 0.0–0.5)
HCT: 41.8 % (ref 38.4–49.9)
HGB: 13.5 g/dL (ref 13.0–17.1)
LYMPH%: 11.5 % — ABNORMAL LOW (ref 14.0–49.0)
MCH: 31.8 pg (ref 27.2–33.4)
MCHC: 32.3 g/dL (ref 32.0–36.0)
MCV: 98.4 fL — ABNORMAL HIGH (ref 79.3–98.0)
MONO#: 0.7 10*3/uL (ref 0.1–0.9)
MONO%: 8.5 % (ref 0.0–14.0)
NEUT#: 6.3 10*3/uL (ref 1.5–6.5)
NEUT%: 78.7 % — ABNORMAL HIGH (ref 39.0–75.0)
Platelets: 227 10*3/uL (ref 140–400)
RBC: 4.24 10*6/uL (ref 4.20–5.82)
RDW: 14.1 % (ref 11.0–14.6)
WBC: 8 10*3/uL (ref 4.0–10.3)
lymph#: 0.9 10*3/uL (ref 0.9–3.3)

## 2014-07-31 LAB — COMPREHENSIVE METABOLIC PANEL (CC13)
ALK PHOS: 98 U/L (ref 40–150)
ALT: 13 U/L (ref 0–55)
AST: 17 U/L (ref 5–34)
Albumin: 3.1 g/dL — ABNORMAL LOW (ref 3.5–5.0)
Anion Gap: 8 mEq/L (ref 3–11)
BUN: 13.6 mg/dL (ref 7.0–26.0)
CALCIUM: 9.2 mg/dL (ref 8.4–10.4)
CHLORIDE: 107 meq/L (ref 98–109)
CO2: 28 mEq/L (ref 22–29)
Creatinine: 1.2 mg/dL (ref 0.7–1.3)
Glucose: 122 mg/dl (ref 70–140)
POTASSIUM: 4.4 meq/L (ref 3.5–5.1)
Sodium: 143 mEq/L (ref 136–145)
Total Bilirubin: 0.56 mg/dL (ref 0.20–1.20)
Total Protein: 6.2 g/dL — ABNORMAL LOW (ref 6.4–8.3)

## 2014-07-31 MED ORDER — ONDANSETRON HCL 8 MG PO TABS
ORAL_TABLET | ORAL | Status: AC
  Filled 2014-07-31: qty 1

## 2014-07-31 MED ORDER — BORTEZOMIB CHEMO SQ INJECTION 3.5 MG (2.5MG/ML)
1.3000 mg/m2 | Freq: Once | INTRAMUSCULAR | Status: AC
  Administered 2014-07-31: 3.25 mg via SUBCUTANEOUS
  Filled 2014-07-31: qty 3.25

## 2014-07-31 MED ORDER — ONDANSETRON HCL 8 MG PO TABS
8.0000 mg | ORAL_TABLET | Freq: Once | ORAL | Status: AC
  Administered 2014-07-31: 8 mg via ORAL

## 2014-07-31 NOTE — Patient Instructions (Signed)
[facility] Discharge Instructions for Patients Receiving Chemotherapy  Today you received the following chemotherapy agents: Velcade. To help prevent nausea and vomiting after your treatment, we encourage you to take your nausea medication.  If you develop nausea and vomiting that is not controlled by your nausea medication, call the clinic.   BELOW ARE SYMPTOMS THAT SHOULD BE REPORTED IMMEDIATELY:  *FEVER GREATER THAN 100.5 F  *CHILLS WITH OR WITHOUT FEVER  NAUSEA AND VOMITING THAT IS NOT CONTROLLED WITH YOUR NAUSEA MEDICATION  *UNUSUAL SHORTNESS OF BREATH  *UNUSUAL BRUISING OR BLEEDING  TENDERNESS IN MOUTH AND THROAT WITH OR WITHOUT PRESENCE OF ULCERS  *URINARY PROBLEMS  *BOWEL PROBLEMS  UNUSUAL RASH Items with * indicate a potential emergency and should be followed up as soon as possible.  Feel free to call the clinic you have any questions or concerns. The clinic phone number is [phone].    

## 2014-07-31 NOTE — Telephone Encounter (Signed)
New message          Pt would like an appt to go over medications

## 2014-07-31 NOTE — Telephone Encounter (Signed)
This encounter was created in error - please disregard.

## 2014-08-07 ENCOUNTER — Other Ambulatory Visit: Payer: Self-pay | Admitting: Hematology and Oncology

## 2014-08-07 ENCOUNTER — Other Ambulatory Visit (HOSPITAL_BASED_OUTPATIENT_CLINIC_OR_DEPARTMENT_OTHER): Payer: Medicare Other

## 2014-08-07 ENCOUNTER — Ambulatory Visit (HOSPITAL_BASED_OUTPATIENT_CLINIC_OR_DEPARTMENT_OTHER): Payer: Medicare Other

## 2014-08-07 VITALS — BP 143/64 | HR 72 | Temp 98.4°F | Resp 18

## 2014-08-07 DIAGNOSIS — Z5112 Encounter for antineoplastic immunotherapy: Secondary | ICD-10-CM

## 2014-08-07 DIAGNOSIS — E859 Amyloidosis, unspecified: Secondary | ICD-10-CM | POA: Diagnosis not present

## 2014-08-07 LAB — COMPREHENSIVE METABOLIC PANEL (CC13)
ALK PHOS: 99 U/L (ref 40–150)
ALT: 15 U/L (ref 0–55)
AST: 18 U/L (ref 5–34)
Albumin: 3.2 g/dL — ABNORMAL LOW (ref 3.5–5.0)
Anion Gap: 9 mEq/L (ref 3–11)
BILIRUBIN TOTAL: 0.64 mg/dL (ref 0.20–1.20)
BUN: 17 mg/dL (ref 7.0–26.0)
CO2: 30 mEq/L — ABNORMAL HIGH (ref 22–29)
Calcium: 8.9 mg/dL (ref 8.4–10.4)
Chloride: 105 mEq/L (ref 98–109)
Creatinine: 1.3 mg/dL (ref 0.7–1.3)
GLUCOSE: 109 mg/dL (ref 70–140)
POTASSIUM: 4.2 meq/L (ref 3.5–5.1)
Sodium: 144 mEq/L (ref 136–145)
Total Protein: 6.5 g/dL (ref 6.4–8.3)

## 2014-08-07 LAB — CBC WITH DIFFERENTIAL/PLATELET
BASO%: 0.5 % (ref 0.0–2.0)
BASOS ABS: 0 10*3/uL (ref 0.0–0.1)
EOS ABS: 0 10*3/uL (ref 0.0–0.5)
EOS%: 0.6 % (ref 0.0–7.0)
HCT: 43 % (ref 38.4–49.9)
HEMOGLOBIN: 13.8 g/dL (ref 13.0–17.1)
LYMPH%: 13 % — AB (ref 14.0–49.0)
MCH: 31.6 pg (ref 27.2–33.4)
MCHC: 32.1 g/dL (ref 32.0–36.0)
MCV: 98.3 fL — AB (ref 79.3–98.0)
MONO#: 0.6 10*3/uL (ref 0.1–0.9)
MONO%: 9.3 % (ref 0.0–14.0)
NEUT%: 76.6 % — ABNORMAL HIGH (ref 39.0–75.0)
NEUTROS ABS: 5.2 10*3/uL (ref 1.5–6.5)
PLATELETS: 191 10*3/uL (ref 140–400)
RBC: 4.38 10*6/uL (ref 4.20–5.82)
RDW: 13.8 % (ref 11.0–14.6)
WBC: 6.7 10*3/uL (ref 4.0–10.3)
lymph#: 0.9 10*3/uL (ref 0.9–3.3)

## 2014-08-07 MED ORDER — BORTEZOMIB CHEMO SQ INJECTION 3.5 MG (2.5MG/ML)
1.3000 mg/m2 | Freq: Once | INTRAMUSCULAR | Status: AC
  Administered 2014-08-07: 3.25 mg via SUBCUTANEOUS
  Filled 2014-08-07: qty 3.25

## 2014-08-07 MED ORDER — ONDANSETRON HCL 8 MG PO TABS
ORAL_TABLET | ORAL | Status: AC
  Filled 2014-08-07: qty 1

## 2014-08-07 MED ORDER — ONDANSETRON HCL 8 MG PO TABS
8.0000 mg | ORAL_TABLET | Freq: Once | ORAL | Status: AC
  Administered 2014-08-07: 8 mg via ORAL

## 2014-08-07 NOTE — Patient Instructions (Signed)
[facility] Discharge Instructions for Patients Receiving Chemotherapy  Today you received the following chemotherapy agents: Velcade.  To help prevent nausea and vomiting after your treatment, we encourage you to take your nausea medication as prescribed.   If you develop nausea and vomiting that is not controlled by your nausea medication, call the clinic.   BELOW ARE SYMPTOMS THAT SHOULD BE REPORTED IMMEDIATELY:  *FEVER GREATER THAN 100.5 F  *CHILLS WITH OR WITHOUT FEVER  NAUSEA AND VOMITING THAT IS NOT CONTROLLED WITH YOUR NAUSEA MEDICATION  *UNUSUAL SHORTNESS OF BREATH  *UNUSUAL BRUISING OR BLEEDING  TENDERNESS IN MOUTH AND THROAT WITH OR WITHOUT PRESENCE OF ULCERS  *URINARY PROBLEMS  *BOWEL PROBLEMS  UNUSUAL RASH Items with * indicate a potential emergency and should be followed up as soon as possible.  Feel free to call the clinic you have any questions or concerns. The clinic phone number is [phone].    

## 2014-08-14 ENCOUNTER — Other Ambulatory Visit (HOSPITAL_BASED_OUTPATIENT_CLINIC_OR_DEPARTMENT_OTHER): Payer: Medicare Other

## 2014-08-14 ENCOUNTER — Ambulatory Visit (HOSPITAL_BASED_OUTPATIENT_CLINIC_OR_DEPARTMENT_OTHER): Payer: Medicare Other

## 2014-08-14 ENCOUNTER — Other Ambulatory Visit: Payer: Self-pay | Admitting: Hematology and Oncology

## 2014-08-14 VITALS — BP 139/69 | HR 65 | Temp 98.1°F | Resp 20

## 2014-08-14 DIAGNOSIS — Z5112 Encounter for antineoplastic immunotherapy: Secondary | ICD-10-CM | POA: Diagnosis not present

## 2014-08-14 DIAGNOSIS — E859 Amyloidosis, unspecified: Secondary | ICD-10-CM | POA: Diagnosis not present

## 2014-08-14 LAB — COMPREHENSIVE METABOLIC PANEL (CC13)
ALT: 16 U/L (ref 0–55)
ANION GAP: 9 meq/L (ref 3–11)
AST: 19 U/L (ref 5–34)
Albumin: 3.1 g/dL — ABNORMAL LOW (ref 3.5–5.0)
Alkaline Phosphatase: 105 U/L (ref 40–150)
BUN: 14.5 mg/dL (ref 7.0–26.0)
CO2: 28 meq/L (ref 22–29)
Calcium: 8.9 mg/dL (ref 8.4–10.4)
Chloride: 106 mEq/L (ref 98–109)
Creatinine: 1.3 mg/dL (ref 0.7–1.3)
Glucose: 104 mg/dl (ref 70–140)
POTASSIUM: 4.3 meq/L (ref 3.5–5.1)
SODIUM: 143 meq/L (ref 136–145)
TOTAL PROTEIN: 6.4 g/dL (ref 6.4–8.3)
Total Bilirubin: 0.48 mg/dL (ref 0.20–1.20)

## 2014-08-14 LAB — CBC WITH DIFFERENTIAL/PLATELET
BASO%: 0.5 % (ref 0.0–2.0)
Basophils Absolute: 0 10*3/uL (ref 0.0–0.1)
EOS%: 0.4 % (ref 0.0–7.0)
Eosinophils Absolute: 0 10*3/uL (ref 0.0–0.5)
HEMATOCRIT: 41.6 % (ref 38.4–49.9)
HGB: 13.4 g/dL (ref 13.0–17.1)
LYMPH#: 1 10*3/uL (ref 0.9–3.3)
LYMPH%: 14.5 % (ref 14.0–49.0)
MCH: 31.6 pg (ref 27.2–33.4)
MCHC: 32.1 g/dL (ref 32.0–36.0)
MCV: 98.3 fL — ABNORMAL HIGH (ref 79.3–98.0)
MONO#: 0.8 10*3/uL (ref 0.1–0.9)
MONO%: 11.3 % (ref 0.0–14.0)
NEUT#: 5.1 10*3/uL (ref 1.5–6.5)
NEUT%: 73.3 % (ref 39.0–75.0)
Platelets: 193 10*3/uL (ref 140–400)
RBC: 4.23 10*6/uL (ref 4.20–5.82)
RDW: 14.1 % (ref 11.0–14.6)
WBC: 7 10*3/uL (ref 4.0–10.3)

## 2014-08-14 MED ORDER — ONDANSETRON HCL 8 MG PO TABS
ORAL_TABLET | ORAL | Status: AC
  Filled 2014-08-14: qty 1

## 2014-08-14 MED ORDER — ONDANSETRON HCL 8 MG PO TABS
8.0000 mg | ORAL_TABLET | Freq: Once | ORAL | Status: AC
  Administered 2014-08-14: 8 mg via ORAL

## 2014-08-14 MED ORDER — BORTEZOMIB CHEMO SQ INJECTION 3.5 MG (2.5MG/ML)
1.3000 mg/m2 | Freq: Once | INTRAMUSCULAR | Status: AC
  Administered 2014-08-14: 3.25 mg via SUBCUTANEOUS
  Filled 2014-08-14: qty 3.25

## 2014-08-14 NOTE — Patient Instructions (Signed)
[facility] Discharge Instructions for Patients Receiving Chemotherapy  Today you received the following chemotherapy agent: Velcade   To help prevent nausea and vomiting after your treatment, we encourage you to take your nausea medication as prescribed.    If you develop nausea and vomiting that is not controlled by your nausea medication, call the clinic.   BELOW ARE SYMPTOMS THAT SHOULD BE REPORTED IMMEDIATELY:  *FEVER GREATER THAN 100.5 F  *CHILLS WITH OR WITHOUT FEVER  NAUSEA AND VOMITING THAT IS NOT CONTROLLED WITH YOUR NAUSEA MEDICATION  *UNUSUAL SHORTNESS OF BREATH  *UNUSUAL BRUISING OR BLEEDING  TENDERNESS IN MOUTH AND THROAT WITH OR WITHOUT PRESENCE OF ULCERS  *URINARY PROBLEMS  *BOWEL PROBLEMS  UNUSUAL RASH Items with * indicate a potential emergency and should be followed up as soon as possible.  Feel free to call the clinic you have any questions or concerns. The clinic phone number is [phone].

## 2014-08-14 NOTE — Progress Notes (Signed)
Patient declined flu shot at today's infusion visit. He wishes to discuss with Dr. Alvy Bimler at next office visit next week.

## 2014-08-20 LAB — SPEP & IFE WITH QIG
ALPHA-1-GLOBULIN: 4.8 % (ref 2.9–4.9)
ALPHA-2-GLOBULIN: 14.9 % — AB (ref 7.1–11.8)
Albumin ELP: 56.1 % (ref 55.8–66.1)
BETA 2: 5.6 % (ref 3.2–6.5)
Beta Globulin: 6 % (ref 4.7–7.2)
GAMMA GLOBULIN: 12.6 % (ref 11.1–18.8)
IgA: 187 mg/dL (ref 68–379)
IgG (Immunoglobin G), Serum: 795 mg/dL (ref 650–1600)
IgM, Serum: 31 mg/dL — ABNORMAL LOW (ref 41–251)
Total Protein, Serum Electrophoresis: 6 g/dL (ref 6.0–8.3)

## 2014-08-20 LAB — KAPPA/LAMBDA LIGHT CHAINS
KAPPA FREE LGHT CHN: 5.55 mg/dL — AB (ref 0.33–1.94)
Kappa:Lambda Ratio: 2.83 — ABNORMAL HIGH (ref 0.26–1.65)
Lambda Free Lght Chn: 1.96 mg/dL (ref 0.57–2.63)

## 2014-08-20 LAB — BETA 2 MICROGLOBULIN, SERUM: BETA 2 MICROGLOBULIN: 3.22 mg/L — AB (ref <=2.51)

## 2014-08-21 ENCOUNTER — Other Ambulatory Visit (HOSPITAL_BASED_OUTPATIENT_CLINIC_OR_DEPARTMENT_OTHER): Payer: Medicare Other

## 2014-08-21 ENCOUNTER — Ambulatory Visit (HOSPITAL_BASED_OUTPATIENT_CLINIC_OR_DEPARTMENT_OTHER): Payer: Medicare Other | Admitting: Hematology and Oncology

## 2014-08-21 ENCOUNTER — Ambulatory Visit (HOSPITAL_BASED_OUTPATIENT_CLINIC_OR_DEPARTMENT_OTHER): Payer: Medicare Other

## 2014-08-21 ENCOUNTER — Encounter: Payer: Self-pay | Admitting: Hematology and Oncology

## 2014-08-21 VITALS — BP 135/63 | HR 70 | Resp 18 | Ht 71.0 in | Wt 280.5 lb

## 2014-08-21 DIAGNOSIS — E669 Obesity, unspecified: Secondary | ICD-10-CM | POA: Diagnosis not present

## 2014-08-21 DIAGNOSIS — Z5112 Encounter for antineoplastic immunotherapy: Secondary | ICD-10-CM | POA: Diagnosis not present

## 2014-08-21 DIAGNOSIS — I10 Essential (primary) hypertension: Secondary | ICD-10-CM

## 2014-08-21 DIAGNOSIS — E859 Amyloidosis, unspecified: Secondary | ICD-10-CM

## 2014-08-21 DIAGNOSIS — R600 Localized edema: Secondary | ICD-10-CM

## 2014-08-21 DIAGNOSIS — R609 Edema, unspecified: Secondary | ICD-10-CM

## 2014-08-21 DIAGNOSIS — M545 Low back pain, unspecified: Secondary | ICD-10-CM | POA: Diagnosis not present

## 2014-08-21 LAB — CBC WITH DIFFERENTIAL/PLATELET
BASO%: 0.5 % (ref 0.0–2.0)
Basophils Absolute: 0 10*3/uL (ref 0.0–0.1)
EOS ABS: 0 10*3/uL (ref 0.0–0.5)
EOS%: 0.5 % (ref 0.0–7.0)
HCT: 42.9 % (ref 38.4–49.9)
HEMOGLOBIN: 13.8 g/dL (ref 13.0–17.1)
LYMPH#: 1.1 10*3/uL (ref 0.9–3.3)
LYMPH%: 14.2 % (ref 14.0–49.0)
MCH: 31.4 pg (ref 27.2–33.4)
MCHC: 32.1 g/dL (ref 32.0–36.0)
MCV: 98 fL (ref 79.3–98.0)
MONO#: 1 10*3/uL — ABNORMAL HIGH (ref 0.1–0.9)
MONO%: 12.6 % (ref 0.0–14.0)
NEUT%: 72.2 % (ref 39.0–75.0)
NEUTROS ABS: 5.5 10*3/uL (ref 1.5–6.5)
Platelets: 184 10*3/uL (ref 140–400)
RBC: 4.38 10*6/uL (ref 4.20–5.82)
RDW: 14.1 % (ref 11.0–14.6)
WBC: 7.6 10*3/uL (ref 4.0–10.3)

## 2014-08-21 LAB — COMPREHENSIVE METABOLIC PANEL (CC13)
ALT: 12 U/L (ref 0–55)
AST: 17 U/L (ref 5–34)
Albumin: 3.2 g/dL — ABNORMAL LOW (ref 3.5–5.0)
Alkaline Phosphatase: 94 U/L (ref 40–150)
Anion Gap: 10 mEq/L (ref 3–11)
BUN: 19.5 mg/dL (ref 7.0–26.0)
CALCIUM: 9 mg/dL (ref 8.4–10.4)
CHLORIDE: 106 meq/L (ref 98–109)
CO2: 27 mEq/L (ref 22–29)
Creatinine: 1.2 mg/dL (ref 0.7–1.3)
GLUCOSE: 85 mg/dL (ref 70–140)
Potassium: 4.3 mEq/L (ref 3.5–5.1)
Sodium: 143 mEq/L (ref 136–145)
Total Bilirubin: 0.72 mg/dL (ref 0.20–1.20)
Total Protein: 6.6 g/dL (ref 6.4–8.3)

## 2014-08-21 MED ORDER — DEXAMETHASONE 4 MG PO TABS: ORAL_TABLET | ORAL | Status: DC

## 2014-08-21 MED ORDER — ONDANSETRON HCL 8 MG PO TABS
8.0000 mg | ORAL_TABLET | Freq: Once | ORAL | Status: AC
  Administered 2014-08-21: 8 mg via ORAL

## 2014-08-21 MED ORDER — BORTEZOMIB CHEMO SQ INJECTION 3.5 MG (2.5MG/ML)
1.3000 mg/m2 | Freq: Once | INTRAMUSCULAR | Status: AC
  Administered 2014-08-21: 3.25 mg via SUBCUTANEOUS
  Filled 2014-08-21: qty 3.25

## 2014-08-21 MED ORDER — ONDANSETRON HCL 8 MG PO TABS
ORAL_TABLET | ORAL | Status: AC
  Filled 2014-08-21: qty 1

## 2014-08-21 NOTE — Progress Notes (Signed)
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 He has significant evaluation including bone marrow aspirate and biopsy. 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 The patient subsequently went for maintain dense chemotherapy with Velcade for almost 2 years and then switch to Revlimid. He self discontinue Revlimid in May of 2014 due to fatigue. In July 2015, we restarted him back on Velcade due to a rising light chain. In September 2015, the patient requested to add back dexamethasone at 20 mg every week. INTERVAL HISTORY: Please see below for problem oriented charting. He continues to complain of back pain and shortness of breath on minimal exertion. He has chronic leg edema. He had mild rash at the injection sites but it does not bother him.  REVIEW OF SYSTEMS:   Constitutional: Denies fevers, chills or abnormal weight loss Eyes: Denies blurriness of vision Ears, nose, mouth, throat, and face: Denies mucositis or sore throat Respiratory: Denies cough, dyspnea or wheezes Cardiovascular: Denies palpitation, chest discomfort  Gastrointestinal:  Denies nausea, heartburn or change in bowel habits Lymphatics: Denies new lymphadenopathy or easy bruising Neurological:Denies numbness, tingling or new weaknesses Behavioral/Psych: Mood is stable, no new changes  All other systems were reviewed with the patient and are negative.  I have reviewed the past medical history, past surgical  history, social history and family history with the patient and they are unchanged from previous note.  ALLERGIES:  has No Known Allergies.  MEDICATIONS:  Current Outpatient Prescriptions  Medication Sig Dispense Refill  . acyclovir (ZOVIRAX) 400 MG tablet Take 400 mg by mouth once.      Marland Kitchen amiodarone (PACERONE) 200 MG tablet Take 1 tablet (200 mg total) by mouth daily.  30 tablet  11  . dexamethasone (DECADRON) 4 MG tablet Take 20 mg by mouth 2 (two) times daily with a meal.      . dexamethasone (DECADRON) 4 MG tablet Take 5 tablets every week on Wednesdays  60 tablet  3  . furosemide (LASIX) 40 MG tablet Take 1.5 tablets (60 mg total) by mouth daily.  30 tablet  11  . nitroGLYCERIN (NITROSTAT) 0.4 MG SL tablet Place 1 tablet (0.4 mg total) under the tongue every 5 (five) minutes as needed for chest pain.  25 tablet  12  . pravastatin (PRAVACHOL) 40 MG tablet Take 1 tablet (40 mg total) by mouth every evening.  30 tablet  11  . rivaroxaban (XARELTO) 20 MG TABS tablet Take 1 tablet (20 mg total) by mouth daily with supper.  90 tablet  3   No current facility-administered medications for this visit.    PHYSICAL EXAMINATION: ECOG PERFORMANCE STATUS: 1 - Symptomatic but completely ambulatory  Filed Vitals:   08/21/14 0813  BP: 135/63  Pulse: 70  Resp: 18   Filed Weights   08/21/14 0813  Weight: 280 lb 8 oz (127.234 kg)    GENERAL:alert, no distress and comfortable. He is morbidly obese SKIN: Noted skin rash injection site. No cellulitis. EYES: normal, Conjunctiva are pink and non-injected, sclera clear OROPHARYNX:no exudate, no erythema and lips, buccal mucosa, and  tongue normal  NECK: supple, thyroid normal size, non-tender, without nodularity LYMPH:  no palpable lymphadenopathy in the cervical, axillary or inguinal LUNGS: clear to auscultation and percussion with normal breathing effort HEART: regular rate & rhythm and no murmurs with moderate bilateral lower extremity  edema ABDOMEN:abdomen soft, non-tender and normal bowel sounds Musculoskeletal:no cyanosis of digits and no clubbing  NEURO: alert & oriented x 3 with fluent speech, no focal motor/sensory deficits  LABORATORY DATA:  I have reviewed the data as listed    Component Value Date/Time   NA 143 08/21/2014 0803   NA 138 06/10/2014 1221   K 4.3 08/21/2014 0803   K 3.6 06/10/2014 1221   CL 102 06/10/2014 1221   CL 103 05/31/2013 0842   CO2 27 08/21/2014 0803   CO2 28 06/10/2014 1221   GLUCOSE 85 08/21/2014 0803   GLUCOSE 150* 06/10/2014 1221   GLUCOSE 118* 05/31/2013 0842   BUN 19.5 08/21/2014 0803   BUN 18 06/10/2014 1221   CREATININE 1.2 08/21/2014 0803   CREATININE 1.13 06/10/2014 1221   CREATININE 1.1 01/31/2014 1613   CREATININE 0.92 12/17/2011 1606   CALCIUM 9.0 08/21/2014 0803   CALCIUM 8.3* 06/10/2014 1221   PROT 6.6 08/21/2014 0803   PROT 6.0 06/10/2014 1221   ALBUMIN 3.2* 08/21/2014 0803   ALBUMIN 3.6 06/10/2014 1221   AST 17 08/21/2014 0803   AST 18 06/10/2014 1221   ALT 12 08/21/2014 0803   ALT 10 06/10/2014 1221   ALKPHOS 94 08/21/2014 0803   ALKPHOS 87 06/10/2014 1221   BILITOT 0.72 08/21/2014 0803   BILITOT 0.8 06/10/2014 1221   GFRNONAA 63 06/10/2014 1221   GFRNONAA 45* 06/01/2013 0548   GFRAA 73 06/10/2014 1221   GFRAA 52* 06/01/2013 0548    No results found for this basename: SPEP,  UPEP,   kappa and lambda light chains    Lab Results  Component Value Date   WBC 7.6 08/21/2014   NEUTROABS 5.5 08/21/2014   HGB 13.8 08/21/2014   HCT 42.9 08/21/2014   MCV 98.0 08/21/2014   PLT 184 08/21/2014      Chemistry      Component Value Date/Time   NA 143 08/21/2014 0803   NA 138 06/10/2014 1221   K 4.3 08/21/2014 0803   K 3.6 06/10/2014 1221   CL 102 06/10/2014 1221   CL 103 05/31/2013 0842   CO2 27 08/21/2014 0803   CO2 28 06/10/2014 1221   BUN 19.5 08/21/2014 0803   BUN 18 06/10/2014 1221   CREATININE 1.2 08/21/2014 0803   CREATININE 1.13 06/10/2014 1221   CREATININE 1.1 01/31/2014 1613   CREATININE 0.92 12/17/2011 1606       Component Value Date/Time   CALCIUM 9.0 08/21/2014 0803   CALCIUM 8.3* 06/10/2014 1221   ALKPHOS 94 08/21/2014 0803   ALKPHOS 87 06/10/2014 1221   AST 17 08/21/2014 0803   AST 18 06/10/2014 1221   ALT 12 08/21/2014 0803   ALT 10 06/10/2014 1221   BILITOT 0.72 08/21/2014 0803   BILITOT 0.8 06/10/2014 1221      ASSESSMENT & PLAN:  AMYLOIDOSIS UNSPECIFIED Clinically, he has no signs of end organ damage. The patient is recommended to continue taking acyclovir for antimicrobial prophylaxis while on Velcade. He tolerates treatment well without any side effects so far. I would not recommend him to take dexamethasone due to the risk of worsening fluid retention and leg edema. Dexamethasone probably would contribute very little to the overall treatment  of his amyloidosis. However, the patient is disappointed to see his light chain went from 3 to 5. He requested repeat treatment with dexamethasone at 20 mg weekly. Despite explaining to the patient the risk of long-term dexamethasone, he is not willing to listen.     OBESITY I discussed with the patient the importance of weight loss. He wants to try to exercise and modify his diet with target weight of 270 pounds by next month.  HYPERTENSION His blood pressure is mildly elevated. I told him dexamethasone will exacerbate his high blood pressure but the patient is not willing to listen. He will continue on dietary modification.  BACK PAIN, LUMBAR He has chronic back pain which I suspect attributed by obesity. The patient will try to lose weight.  Pedal edema This is likely related to diastolic heart failure and fluid retention. Rather than subject him to more medication, I recommend dietary modification, exercise and weight loss. I told him dexamethasone will likely exacerbate this further but the patient is not willing to listen.    Orders Placed This Encounter  Procedures  . SPEP & IFE with QIG    Standing Status: Future     Number of  Occurrences:      Standing Expiration Date: 09/25/2015  . Kappa/lambda light chains    Standing Status: Future     Number of Occurrences:      Standing Expiration Date: 09/25/2015   All questions were answered. The patient knows to call the clinic with any problems, questions or concerns. No barriers to learning was detected. I spent 30 minutes counseling the patient face to face. The total time spent in the appointment was 40 minutes and more than 50% was on counseling and review of test results     [facility], Takoda Siedlecki, MD 08/21/2014 9:00 AM

## 2014-08-21 NOTE — Patient Instructions (Signed)
[facility] Discharge Instructions for Patients Receiving Chemotherapy  Today you received the following chemotherapy agents: Velcade.  To help prevent nausea and vomiting after your treatment, we encourage you to take your nausea medication as prescribed.   If you develop nausea and vomiting that is not controlled by your nausea medication, call the clinic.   BELOW ARE SYMPTOMS THAT SHOULD BE REPORTED IMMEDIATELY:  *FEVER GREATER THAN 100.5 F  *CHILLS WITH OR WITHOUT FEVER  NAUSEA AND VOMITING THAT IS NOT CONTROLLED WITH YOUR NAUSEA MEDICATION  *UNUSUAL SHORTNESS OF BREATH  *UNUSUAL BRUISING OR BLEEDING  TENDERNESS IN MOUTH AND THROAT WITH OR WITHOUT PRESENCE OF ULCERS  *URINARY PROBLEMS  *BOWEL PROBLEMS  UNUSUAL RASH Items with * indicate a potential emergency and should be followed up as soon as possible.  Feel free to call the clinic you have any questions or concerns. The clinic phone number is [phone].    

## 2014-08-21 NOTE — Assessment & Plan Note (Signed)
Clinically, he has no signs of end organ damage. The patient is recommended to continue taking acyclovir for antimicrobial prophylaxis while on Velcade. He tolerates treatment well without any side effects so far. I would not recommend him to take dexamethasone due to the risk of worsening fluid retention and leg edema. Dexamethasone probably would contribute very little to the overall treatment of his amyloidosis. However, the patient is disappointed to see his light chain went from 3 to 5. He requested repeat treatment with dexamethasone at 20 mg weekly. Despite explaining to the patient the risk of long-term dexamethasone, he is not willing to listen.

## 2014-08-21 NOTE — Assessment & Plan Note (Signed)
He has chronic back pain which I suspect attributed by obesity. The patient will try to lose weight.

## 2014-08-21 NOTE — Assessment & Plan Note (Signed)
His blood pressure is mildly elevated. I told him dexamethasone will exacerbate his high blood pressure but the patient is not willing to listen. He will continue on dietary modification.

## 2014-08-21 NOTE — Assessment & Plan Note (Signed)
This is likely related to diastolic heart failure and fluid retention. Rather than subject him to more medication, I recommend dietary modification, exercise and weight loss. I told him dexamethasone will likely exacerbate this further but the patient is not willing to listen.

## 2014-08-21 NOTE — Assessment & Plan Note (Signed)
I discussed with the patient the importance of weight loss. He wants to try to exercise and modify his diet with target weight of 270 pounds by next month.

## 2014-08-23 ENCOUNTER — Telehealth: Payer: Self-pay | Admitting: Cardiology

## 2014-08-23 ENCOUNTER — Telehealth: Payer: Self-pay | Admitting: Hematology and Oncology

## 2014-08-23 ENCOUNTER — Ambulatory Visit (HOSPITAL_BASED_OUTPATIENT_CLINIC_OR_DEPARTMENT_OTHER)
Admission: RE | Admit: 2014-08-23 | Discharge: 2014-08-23 | Disposition: A | Discharge status: Home / Self Care | Payer: Medicare Other | Source: Ambulatory Visit | Attending: Cardiology | Admitting: Cardiology

## 2014-08-23 DIAGNOSIS — I10 Essential (primary) hypertension: Secondary | ICD-10-CM | POA: Insufficient documentation

## 2014-08-23 DIAGNOSIS — I517 Cardiomegaly: Secondary | ICD-10-CM | POA: Insufficient documentation

## 2014-08-23 DIAGNOSIS — E7800 Pure hypercholesterolemia, unspecified: Secondary | ICD-10-CM | POA: Diagnosis not present

## 2014-08-23 DIAGNOSIS — I471 Supraventricular tachycardia, unspecified: Secondary | ICD-10-CM | POA: Diagnosis not present

## 2014-08-23 DIAGNOSIS — E859 Amyloidosis, unspecified: Secondary | ICD-10-CM | POA: Diagnosis not present

## 2014-08-23 DIAGNOSIS — R0602 Shortness of breath: Secondary | ICD-10-CM | POA: Diagnosis not present

## 2014-08-23 DIAGNOSIS — I4891 Unspecified atrial fibrillation: Secondary | ICD-10-CM | POA: Diagnosis not present

## 2014-08-23 DIAGNOSIS — I5032 Chronic diastolic (congestive) heart failure: Secondary | ICD-10-CM | POA: Insufficient documentation

## 2014-08-23 DIAGNOSIS — R000 Tachycardia, unspecified: Secondary | ICD-10-CM | POA: Diagnosis not present

## 2014-08-23 NOTE — Telephone Encounter (Signed)
Patient refuses to talk to triage.  Message forwarded to [employer].

## 2014-08-23 NOTE — Telephone Encounter (Signed)
lvm for pt regarding to Sept and OCT appt.....advised pt to pick up new sched at nxt visit

## 2014-08-23 NOTE — Telephone Encounter (Signed)
Spoke with patient about CXR results and patient states he has increased SOB and feels he needs to come in sooner than his next scheduled appointment time. He wants DEBRA to call him to make this decision as "she knows his background" let him know she will be back next week and I would let her know the current issues.

## 2014-08-23 NOTE — Telephone Encounter (Signed)
New message    Patient calling C/O sob.   Asking for Johnny Mitchell to give him a call .   Patient is aware that she is not in the office this week .   Patient states he does not want a triage nurse to call him will wait for Johnny Mitchell to call him on Monday.

## 2014-08-26 NOTE — Telephone Encounter (Signed)
Spoke with pt, Follow up scheduled  

## 2014-08-28 ENCOUNTER — Encounter: Payer: Self-pay | Admitting: Cardiology

## 2014-08-28 ENCOUNTER — Ambulatory Visit (HOSPITAL_BASED_OUTPATIENT_CLINIC_OR_DEPARTMENT_OTHER): Payer: Medicare Other

## 2014-08-28 ENCOUNTER — Other Ambulatory Visit (HOSPITAL_BASED_OUTPATIENT_CLINIC_OR_DEPARTMENT_OTHER): Payer: Medicare Other

## 2014-08-28 ENCOUNTER — Ambulatory Visit (INDEPENDENT_AMBULATORY_CARE_PROVIDER_SITE_OTHER): Payer: Medicare Other | Admitting: Cardiology

## 2014-08-28 VITALS — BP 139/81 | HR 66 | Ht 71.0 in | Wt 270.0 lb

## 2014-08-28 VITALS — BP 137/63 | HR 59 | Temp 98.7°F | Resp 18

## 2014-08-28 DIAGNOSIS — Z5112 Encounter for antineoplastic immunotherapy: Secondary | ICD-10-CM

## 2014-08-28 DIAGNOSIS — I471 Supraventricular tachycardia: Secondary | ICD-10-CM | POA: Diagnosis not present

## 2014-08-28 DIAGNOSIS — E859 Amyloidosis, unspecified: Secondary | ICD-10-CM

## 2014-08-28 DIAGNOSIS — E8589 Other amyloidosis: Secondary | ICD-10-CM | POA: Diagnosis not present

## 2014-08-28 DIAGNOSIS — R0602 Shortness of breath: Secondary | ICD-10-CM

## 2014-08-28 DIAGNOSIS — I2510 Atherosclerotic heart disease of native coronary artery without angina pectoris: Secondary | ICD-10-CM

## 2014-08-28 DIAGNOSIS — I4891 Unspecified atrial fibrillation: Secondary | ICD-10-CM

## 2014-08-28 DIAGNOSIS — I5032 Chronic diastolic (congestive) heart failure: Secondary | ICD-10-CM

## 2014-08-28 DIAGNOSIS — E7800 Pure hypercholesterolemia, unspecified: Secondary | ICD-10-CM

## 2014-08-28 DIAGNOSIS — I10 Essential (primary) hypertension: Secondary | ICD-10-CM

## 2014-08-28 LAB — CBC WITH DIFFERENTIAL/PLATELET
BASO%: 0.2 % (ref 0.0–2.0)
Basophils Absolute: 0 10*3/uL (ref 0.0–0.1)
EOS%: 0.6 % (ref 0.0–7.0)
Eosinophils Absolute: 0 10*3/uL (ref 0.0–0.5)
HEMATOCRIT: 45 % (ref 38.4–49.9)
HGB: 14.2 g/dL (ref 13.0–17.1)
LYMPH%: 10.1 % — AB (ref 14.0–49.0)
MCH: 31.3 pg (ref 27.2–33.4)
MCHC: 31.5 g/dL — AB (ref 32.0–36.0)
MCV: 99.2 fL — AB (ref 79.3–98.0)
MONO#: 0.6 10*3/uL (ref 0.1–0.9)
MONO%: 7.9 % (ref 0.0–14.0)
NEUT#: 6.3 10*3/uL (ref 1.5–6.5)
NEUT%: 81.2 % — AB (ref 39.0–75.0)
PLATELETS: 178 10*3/uL (ref 140–400)
RBC: 4.53 10*6/uL (ref 4.20–5.82)
RDW: 14.1 % (ref 11.0–14.6)
WBC: 7.7 10*3/uL (ref 4.0–10.3)
lymph#: 0.8 10*3/uL — ABNORMAL LOW (ref 0.9–3.3)

## 2014-08-28 LAB — COMPREHENSIVE METABOLIC PANEL (CC13)
ALT: 15 U/L (ref 0–55)
ANION GAP: 9 meq/L (ref 3–11)
AST: 15 U/L (ref 5–34)
Albumin: 3.3 g/dL — ABNORMAL LOW (ref 3.5–5.0)
Alkaline Phosphatase: 93 U/L (ref 40–150)
BILIRUBIN TOTAL: 0.71 mg/dL (ref 0.20–1.20)
BUN: 25 mg/dL (ref 7.0–26.0)
CO2: 28 meq/L (ref 22–29)
CREATININE: 1.6 mg/dL — AB (ref 0.7–1.3)
Calcium: 9.3 mg/dL (ref 8.4–10.4)
Chloride: 103 mEq/L (ref 98–109)
Glucose: 114 mg/dl (ref 70–140)
Potassium: 4.4 mEq/L (ref 3.5–5.1)
Sodium: 141 mEq/L (ref 136–145)
Total Protein: 6.6 g/dL (ref 6.4–8.3)

## 2014-08-28 MED ORDER — ONDANSETRON HCL 8 MG PO TABS
8.0000 mg | ORAL_TABLET | Freq: Once | ORAL | Status: AC
  Administered 2014-08-28: 8 mg via ORAL

## 2014-08-28 MED ORDER — ONDANSETRON HCL 8 MG PO TABS
ORAL_TABLET | ORAL | Status: AC
  Filled 2014-08-28: qty 1

## 2014-08-28 MED ORDER — BORTEZOMIB CHEMO SQ INJECTION 3.5 MG (2.5MG/ML)
1.3000 mg/m2 | Freq: Once | INTRAMUSCULAR | Status: AC
  Administered 2014-08-28: 3.25 mg via SUBCUTANEOUS
  Filled 2014-08-28: qty 3.25

## 2014-08-28 MED ORDER — FUROSEMIDE 40 MG PO TABS: 40.0000 mg | ORAL_TABLET | Freq: Every day | ORAL | Status: DC

## 2014-08-28 NOTE — Assessment & Plan Note (Signed)
Continue statin. 

## 2014-08-28 NOTE — Patient Instructions (Signed)
Your physician recommends that you schedule a follow-up appointment in: [place] 40 MG ONCE DAILY AND TAKE AN ADDITIONAL 40 MG AS NEEDED FOR SWELLING AND SHORTNESS OF BREATH  Your physician recommends that you return for lab work in: [place]

## 2014-08-28 NOTE — Assessment & Plan Note (Signed)
Continue present blood pressure medications. 

## 2014-08-28 NOTE — Assessment & Plan Note (Signed)
Continue statin. Not on aspirin given need for anticoagulation. 

## 2014-08-28 NOTE — Assessment & Plan Note (Signed)
Continue amiodarone. 

## 2014-08-28 NOTE — Assessment & Plan Note (Signed)
Volume status has improved.Recent creatinine increased to 1.6. Change Lasix to 40 mg daily with an additional 40 mg daily as needed. Patient instructed on low sodium diet and fluid restriction.

## 2014-08-28 NOTE — Progress Notes (Signed)
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 LHC 10/2009: pLAD 20%, dLAD 30%, mOM2 70-80% (unchanged compared to previous), pRCA 20%, dRCA 20%. Cardiac MRI in January of 2011 showed an ejection fraction of 57%, mild biatrial enlargement, mild right ventricular enlargement. Cardiac amyloid could not be definitively excluded.  Admitted in February of 2012 with a wide complex tachycardia. Seen by EP and rhythm was most likely felt to be SVT with aberrancy. It was felt that if symptoms recur ablation would be warranted.  Echo 10/2012: Abnormal septal motion from ?bundle branch block, moderate LVH, EF 55-60%, mild MR, moderate LAE mild RAE, PASP 53.  Cardiac MRI (09/05/13): Moderate LVE, focal septal hypertrophy, EF 51%, global HK, mild RVE, normal RVSF, no evidence of cardiac amyloid. He has had some increase in his light chains on electrophoresis. This is being followed. Nuclear study in March of 2015 showed apical thinning but no ischemia. Ejection fraction was 48%. Since last seen, He has some dyspnea on exertion unchanged. Pedal edema has improved. No chest pain. No syncope.    Current Outpatient Prescriptions  Medication Sig Dispense Refill  . acyclovir (ZOVIRAX) 400 MG tablet Take 400 mg by mouth once.      Marland Kitchen amiodarone (PACERONE) 200 MG tablet Take 1 tablet (200 mg total) by mouth daily.  30 tablet  11  . dexamethasone (DECADRON) 4 MG tablet Take 5 tablets every week on Wednesdays  60 tablet  3  . furosemide (LASIX) 40 MG tablet Take 1.5 tablets (60 mg total) by mouth daily.  30 tablet  11  . nitroGLYCERIN (NITROSTAT) 0.4 MG SL tablet Place 1 tablet (0.4 mg total) under the tongue every 5 (five) minutes as needed for chest pain.  25 tablet  12  . pravastatin (PRAVACHOL) 40 MG tablet Take 1 tablet (40 mg total) by mouth every evening.  30 tablet  11  . rivaroxaban (XARELTO) 20 MG TABS tablet Take 1 tablet (20 mg total) by mouth daily  with supper.  90 tablet  3   No current facility-administered medications for this visit.     Past Medical History  Diagnosis Date  . Hypertension   . Coronary atherosclerosis of unspecified type of vessel, native or graft     Non obstructive  . Cerebrovascular disease, unspecified   . Pure hypercholesterolemia   . Diverticulosis of colon (without mention of hemorrhage)   . Benign neoplasm of colon   . Unspecified hemorrhoids without mention of complication   . Degenerative disc disease   . 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 Hemangioma   . Amyloidosis   . LBBB (left bundle branch block)   . 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 OSA (obstructive sleep apnea)   . Atrial fibrillation   . Cancer     amyloidosis  . Edema 08/29/2013  . Hx of cardiovascular stress test     Adenosine Myoview (02/2014): Apical thinning, no ischemia, EF 48%; low risk.    Past Surgical History  Procedure Laterality Date  . Knee arthroscopy    . Carpal tunnel release  08/2006    Dr Doy Mince at [facility] - Bilateral  . 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 Back surgery      Lumbar  . Cardioversion N/A 07/10/2013    Procedure: CARDIOVERSION;  Surgeon: Lelon Perla, MD;  Location: [facility];  Service:  Cardiovascular;  Laterality: N/A;    History   Social History  . Marital Status: Married    Spouse Name: N/A    Number of Children: 2  . Years of Education: N/A   Occupational History  . PRESIDENT     [employer]   Social History Main Topics  . Smoking status: Never Smoker   . Smokeless tobacco: Never Used  . Alcohol Use: Yes  . Drug Use: No  . Sexual Activity: No   Other Topics Concern  . Not on file   Social History Narrative   Lives with wife.      ROS: no fevers or chills, productive cough, hemoptysis, dysphasia, odynophagia, melena, hematochezia, dysuria, hematuria, rash, seizure activity, orthopnea, PND,  claudication. Remaining systems are negative.  Physical Exam: Well-developed well-nourished in no acute distress.  Skin is warm and dry.  HEENT is normal.  Neck is supple.  Chest is clear to auscultation with normal expansion.  Cardiovascular exam is regular rate and rhythm.  Abdominal exam nontender or distended. No masses palpated. Extremities show trace edema. neuro grossly intact  ECG Sinus rhythm, left bundle branch block.

## 2014-08-28 NOTE — Patient Instructions (Signed)
[facility] Discharge Instructions for Patients Receiving Chemotherapy  Today you received the following chemotherapy agents Velcade.  To help prevent nausea and vomiting after your treatment, we encourage you to take your nausea medication as directed.    If you develop nausea and vomiting that is not controlled by your nausea medication, call the clinic.   BELOW ARE SYMPTOMS THAT SHOULD BE REPORTED IMMEDIATELY:  *FEVER GREATER THAN 100.5 F  *CHILLS WITH OR WITHOUT FEVER  NAUSEA AND VOMITING THAT IS NOT CONTROLLED WITH YOUR NAUSEA MEDICATION  *UNUSUAL SHORTNESS OF BREATH  *UNUSUAL BRUISING OR BLEEDING  TENDERNESS IN MOUTH AND THROAT WITH OR WITHOUT PRESENCE OF ULCERS  *URINARY PROBLEMS  *BOWEL PROBLEMS  UNUSUAL RASH Items with * indicate a potential emergency and should be followed up as soon as possible.  Feel free to call the clinic you have any questions or concerns. The clinic phone number is [phone].    

## 2014-08-28 NOTE — Assessment & Plan Note (Signed)
Continue amiodarone. Continue xarelto. 

## 2014-08-28 NOTE — Assessment & Plan Note (Signed)
Followed by Oncology

## 2014-08-28 NOTE — Progress Notes (Signed)
Creatinine 1.6. Dr Alvy Bimler states ok to treat.

## 2014-09-02 ENCOUNTER — Telehealth: Payer: Self-pay | Admitting: *Deleted

## 2014-09-02 NOTE — Telephone Encounter (Signed)
Left message with note below. To call if has questions 

## 2014-09-02 NOTE — Telephone Encounter (Signed)
Message copied by Patton Salles on Mon Sep 02, 2014 11:09 AM ------      Message from: [facility], [place]: Mon Sep 02, 2014 11:04 AM      Regarding: dehydration       He has elevated creatinine, make sure he reduces lasix and increase oral fluid      ----- Message -----         From: SYSTEM         Sent: 09/02/2014  12:01 AM           To: Heath Lark, MD                   ------

## 2014-09-03 LAB — SPEP & IFE WITH QIG
Albumin ELP: 56.3 % (ref 55.8–66.1)
Alpha-1-Globulin: 4 % (ref 2.9–4.9)
Alpha-2-Globulin: 15.3 % — ABNORMAL HIGH (ref 7.1–11.8)
Beta 2: 5.8 % (ref 3.2–6.5)
Beta Globulin: 6 % (ref 4.7–7.2)
GAMMA GLOBULIN: 12.6 % (ref 11.1–18.8)
IGM, SERUM: 30 mg/dL — AB (ref 41–251)
IgA: 161 mg/dL (ref 68–379)
IgG (Immunoglobin G), Serum: 814 mg/dL (ref 650–1600)
Total Protein, Serum Electrophoresis: 6.1 g/dL (ref 6.0–8.3)

## 2014-09-03 LAB — KAPPA/LAMBDA LIGHT CHAINS
Kappa free light chain: 4.33 mg/dL — ABNORMAL HIGH (ref 0.33–1.94)
Kappa:Lambda Ratio: 3.26 — ABNORMAL HIGH (ref 0.26–1.65)
Lambda Free Lght Chn: 1.33 mg/dL (ref 0.57–2.63)

## 2014-09-04 ENCOUNTER — Ambulatory Visit (HOSPITAL_BASED_OUTPATIENT_CLINIC_OR_DEPARTMENT_OTHER): Payer: Medicare Other

## 2014-09-04 ENCOUNTER — Other Ambulatory Visit (HOSPITAL_BASED_OUTPATIENT_CLINIC_OR_DEPARTMENT_OTHER): Payer: Medicare Other

## 2014-09-04 VITALS — BP 147/77 | HR 63 | Temp 99.0°F | Resp 18

## 2014-09-04 DIAGNOSIS — E859 Amyloidosis, unspecified: Secondary | ICD-10-CM

## 2014-09-04 DIAGNOSIS — Z5112 Encounter for antineoplastic immunotherapy: Secondary | ICD-10-CM | POA: Diagnosis not present

## 2014-09-04 LAB — CBC WITH DIFFERENTIAL/PLATELET
BASO%: 0.4 % (ref 0.0–2.0)
Basophils Absolute: 0 10*3/uL (ref 0.0–0.1)
EOS%: 0.7 % (ref 0.0–7.0)
Eosinophils Absolute: 0.1 10*3/uL (ref 0.0–0.5)
HCT: 43.5 % (ref 38.4–49.9)
HGB: 13.8 g/dL (ref 13.0–17.1)
LYMPH%: 12.1 % — ABNORMAL LOW (ref 14.0–49.0)
MCH: 31.4 pg (ref 27.2–33.4)
MCHC: 31.8 g/dL — AB (ref 32.0–36.0)
MCV: 98.6 fL — ABNORMAL HIGH (ref 79.3–98.0)
MONO#: 0.7 10*3/uL (ref 0.1–0.9)
MONO%: 7.9 % (ref 0.0–14.0)
NEUT#: 7.2 10*3/uL — ABNORMAL HIGH (ref 1.5–6.5)
NEUT%: 78.9 % — ABNORMAL HIGH (ref 39.0–75.0)
PLATELETS: 160 10*3/uL (ref 140–400)
RBC: 4.41 10*6/uL (ref 4.20–5.82)
RDW: 14.3 % (ref 11.0–14.6)
WBC: 9.1 10*3/uL (ref 4.0–10.3)
lymph#: 1.1 10*3/uL (ref 0.9–3.3)

## 2014-09-04 LAB — COMPREHENSIVE METABOLIC PANEL (CC13)
ALK PHOS: 70 U/L (ref 40–150)
ALT: 12 U/L (ref 0–55)
AST: 15 U/L (ref 5–34)
Albumin: 3.2 g/dL — ABNORMAL LOW (ref 3.5–5.0)
Anion Gap: 6 mEq/L (ref 3–11)
BUN: 19.9 mg/dL (ref 7.0–26.0)
CO2: 28 mEq/L (ref 22–29)
Calcium: 9 mg/dL (ref 8.4–10.4)
Chloride: 108 mEq/L (ref 98–109)
Creatinine: 1.1 mg/dL (ref 0.7–1.3)
Glucose: 110 mg/dl (ref 70–140)
Potassium: 4.6 mEq/L (ref 3.5–5.1)
SODIUM: 143 meq/L (ref 136–145)
TOTAL PROTEIN: 6 g/dL — AB (ref 6.4–8.3)
Total Bilirubin: 0.78 mg/dL (ref 0.20–1.20)

## 2014-09-04 MED ORDER — BORTEZOMIB CHEMO SQ INJECTION 3.5 MG (2.5MG/ML)
1.3000 mg/m2 | Freq: Once | INTRAMUSCULAR | Status: AC
  Administered 2014-09-04: 3.25 mg via SUBCUTANEOUS
  Filled 2014-09-04: qty 3.25

## 2014-09-04 MED ORDER — ONDANSETRON HCL 8 MG PO TABS
ORAL_TABLET | ORAL | Status: AC
  Filled 2014-09-04: qty 1

## 2014-09-04 MED ORDER — ONDANSETRON HCL 8 MG PO TABS
8.0000 mg | ORAL_TABLET | Freq: Once | ORAL | Status: AC
  Administered 2014-09-04: 8 mg via ORAL

## 2014-09-04 NOTE — Patient Instructions (Signed)
Bortezomib injection What is this medicine? BORTEZOMIB (bor TEZ oh mib) is a chemotherapy drug. It slows the growth of cancer cells. This medicine is used to treat multiple myeloma, and certain lymphomas, such as mantle-cell lymphoma. This medicine may be used for other purposes; ask your health care provider or pharmacist if you have questions. COMMON BRAND NAME(S): Velcade What should I tell my health care provider before I take this medicine? They need to know if you have any of these conditions: -diabetes -heart disease -irregular heartbeat -liver disease -on hemodialysis -low blood counts, like low white blood cells, platelets, or hemoglobin -peripheral neuropathy -taking medicine for blood pressure -an unusual or allergic reaction to bortezomib, mannitol, boron, other medicines, foods, dyes, or preservatives -pregnant or trying to get pregnant -breast-feeding How should I use this medicine? This medicine is for injection into a vein or for injection under the skin. It is given by a health care professional in a hospital or clinic setting. Talk to your pediatrician regarding the use of this medicine in children. Special care may be needed. Overdosage: If you think you have taken too much of this medicine contact a poison control center or emergency room at once. NOTE: This medicine is only for you. Do not share this medicine with others. What if I miss a dose? It is important not to miss your dose. Call your doctor or health care professional if you are unable to keep an appointment. What may interact with this medicine? This medicine may interact with the following medications: -ketoconazole -rifampin -ritonavir -St. John's Wort This list may not describe all possible interactions. Give your health care provider a list of all the medicines, herbs, non-prescription drugs, or dietary supplements you use. Also tell them if you smoke, drink alcohol, or use illegal drugs. Some items  may interact with your medicine. What should I watch for while using this medicine? Visit your doctor for checks on your progress. This drug may make you feel generally unwell. This is not uncommon, as chemotherapy can affect healthy cells as well as cancer cells. Report any side effects. Continue your course of treatment even though you feel ill unless your doctor tells you to stop. You may get drowsy or dizzy. Do not drive, use machinery, or do anything that needs mental alertness until you know how this medicine affects you. Do not stand or sit up quickly, especially if you are an older patient. This reduces the risk of dizzy or fainting spells. In some cases, you may be given additional medicines to help with side effects. Follow all directions for their use. Call your doctor or health care professional for advice if you get a fever, chills or sore throat, or other symptoms of a cold or flu. Do not treat yourself. This drug decreases your body's ability to fight infections. Try to avoid being around people who are sick. This medicine may increase your risk to bruise or bleed. Call your doctor or health care professional if you notice any unusual bleeding. You may need blood work done while you are taking this medicine. In some patients, this medicine may cause a serious brain infection that may cause death. If you have any problems seeing, thinking, speaking, walking, or standing, tell your doctor right away. If you cannot reach your doctor, urgently seek other source of medical care. Do not become pregnant while taking this medicine. Women should inform their doctor if they wish to become pregnant or think they might be pregnant. There is   a potential for serious side effects to an unborn child. Talk to your health care professional or pharmacist for more information. Do not breast-feed an infant while taking this medicine. Check with your doctor or health care professional if you get an attack of  severe diarrhea, nausea and vomiting, or if you sweat a lot. The loss of too much body fluid can make it dangerous for you to take this medicine. What side effects may I notice from receiving this medicine? Side effects that you should report to your doctor or health care professional as soon as possible: -allergic reactions like skin rash, itching or hives, swelling of the face, lips, or tongue -breathing problems -changes in hearing -changes in vision -fast, irregular heartbeat -feeling faint or lightheaded, falls -pain, tingling, numbness in the hands or feet -right upper belly pain -seizures -swelling of the ankles, feet, hands -unusual bleeding or bruising -unusually weak or tired -vomiting -yellowing of the eyes or skin Side effects that usually do not require medical attention (report to your doctor or health care professional if they continue or are bothersome): -changes in emotions or moods -constipation -diarrhea -loss of appetite -headache -irritation at site where injected -nausea This list may not describe all possible side effects. Call your doctor for medical advice about side effects. You may report side effects to FDA at 1-800-FDA-1088. Where should I keep my medicine? This drug is given in a hospital or clinic and will not be stored at home. NOTE: This sheet is a summary. It may not cover all possible information. If you have questions about this medicine, talk to your doctor, pharmacist, or health care provider.  2015, [employer] Standard. (2013-09-24 12:46:32)  

## 2014-09-05 ENCOUNTER — Encounter: Payer: Self-pay | Admitting: Internal Medicine

## 2014-09-05 ENCOUNTER — Encounter: Payer: Self-pay | Admitting: Hematology and Oncology

## 2014-09-11 ENCOUNTER — Ambulatory Visit (HOSPITAL_BASED_OUTPATIENT_CLINIC_OR_DEPARTMENT_OTHER): Payer: Medicare Other

## 2014-09-11 ENCOUNTER — Other Ambulatory Visit (HOSPITAL_BASED_OUTPATIENT_CLINIC_OR_DEPARTMENT_OTHER): Payer: Medicare Other

## 2014-09-11 VITALS — BP 136/58 | HR 76 | Temp 98.7°F | Resp 18

## 2014-09-11 DIAGNOSIS — E859 Amyloidosis, unspecified: Secondary | ICD-10-CM

## 2014-09-11 DIAGNOSIS — Z5112 Encounter for antineoplastic immunotherapy: Secondary | ICD-10-CM

## 2014-09-11 LAB — CBC WITH DIFFERENTIAL/PLATELET
BASO%: 0.1 % (ref 0.0–2.0)
BASOS ABS: 0 10*3/uL (ref 0.0–0.1)
EOS%: 0.2 % (ref 0.0–7.0)
Eosinophils Absolute: 0 10*3/uL (ref 0.0–0.5)
HEMATOCRIT: 44 % (ref 38.4–49.9)
HEMOGLOBIN: 14.2 g/dL (ref 13.0–17.1)
LYMPH%: 5.3 % — AB (ref 14.0–49.0)
MCH: 31.7 pg (ref 27.2–33.4)
MCHC: 32.3 g/dL (ref 32.0–36.0)
MCV: 98 fL (ref 79.3–98.0)
MONO#: 0.2 10*3/uL (ref 0.1–0.9)
MONO%: 1.8 % (ref 0.0–14.0)
NEUT#: 10 10*3/uL — ABNORMAL HIGH (ref 1.5–6.5)
NEUT%: 92.6 % — AB (ref 39.0–75.0)
PLATELETS: 140 10*3/uL (ref 140–400)
RBC: 4.49 10*6/uL (ref 4.20–5.82)
RDW: 14.6 % (ref 11.0–14.6)
WBC: 10.8 10*3/uL — AB (ref 4.0–10.3)
lymph#: 0.6 10*3/uL — ABNORMAL LOW (ref 0.9–3.3)

## 2014-09-11 LAB — COMPREHENSIVE METABOLIC PANEL (CC13)
ALBUMIN: 3.3 g/dL — AB (ref 3.5–5.0)
ALT: 19 U/L (ref 0–55)
AST: 20 U/L (ref 5–34)
Alkaline Phosphatase: 107 U/L (ref 40–150)
Anion Gap: 6 mEq/L (ref 3–11)
BUN: 26.6 mg/dL — ABNORMAL HIGH (ref 7.0–26.0)
CHLORIDE: 105 meq/L (ref 98–109)
CO2: 29 mEq/L (ref 22–29)
Calcium: 9 mg/dL (ref 8.4–10.4)
Creatinine: 1.3 mg/dL (ref 0.7–1.3)
Glucose: 113 mg/dl (ref 70–140)
POTASSIUM: 4.3 meq/L (ref 3.5–5.1)
SODIUM: 140 meq/L (ref 136–145)
TOTAL PROTEIN: 6.3 g/dL — AB (ref 6.4–8.3)
Total Bilirubin: 0.69 mg/dL (ref 0.20–1.20)

## 2014-09-11 MED ORDER — BORTEZOMIB CHEMO SQ INJECTION 3.5 MG (2.5MG/ML)
1.3000 mg/m2 | Freq: Once | INTRAMUSCULAR | Status: AC
  Administered 2014-09-11: 3.25 mg via SUBCUTANEOUS
  Filled 2014-09-11: qty 3.25

## 2014-09-11 MED ORDER — ONDANSETRON HCL 8 MG PO TABS
8.0000 mg | ORAL_TABLET | Freq: Once | ORAL | Status: AC
Start: 2014-09-11 — End: 2014-09-11
  Administered 2014-09-11: 8 mg via ORAL

## 2014-09-11 MED ORDER — ONDANSETRON HCL 8 MG PO TABS
ORAL_TABLET | ORAL | Status: AC
  Filled 2014-09-11: qty 1

## 2014-09-11 NOTE — Patient Instructions (Signed)
Bortezomib injection What is this medicine? BORTEZOMIB (bor TEZ oh mib) is a chemotherapy drug. It slows the growth of cancer cells. This medicine is used to treat multiple myeloma, and certain lymphomas, such as mantle-cell lymphoma. This medicine may be used for other purposes; ask your health care provider or pharmacist if you have questions. COMMON BRAND NAME(S): Velcade What should I tell my health care provider before I take this medicine? They need to know if you have any of these conditions: -diabetes -heart disease -irregular heartbeat -liver disease -on hemodialysis -low blood counts, like low white blood cells, platelets, or hemoglobin -peripheral neuropathy -taking medicine for blood pressure -an unusual or allergic reaction to bortezomib, mannitol, boron, other medicines, foods, dyes, or preservatives -pregnant or trying to get pregnant -breast-feeding How should I use this medicine? This medicine is for injection into a vein or for injection under the skin. It is given by a health care professional in a hospital or clinic setting. Talk to your pediatrician regarding the use of this medicine in children. Special care may be needed. Overdosage: If you think you have taken too much of this medicine contact a poison control center or emergency room at once. NOTE: This medicine is only for you. Do not share this medicine with others. What if I miss a dose? It is important not to miss your dose. Call your doctor or health care professional if you are unable to keep an appointment. What may interact with this medicine? This medicine may interact with the following medications: -ketoconazole -rifampin -ritonavir -St. John's Wort This list may not describe all possible interactions. Give your health care provider a list of all the medicines, herbs, non-prescription drugs, or dietary supplements you use. Also tell them if you smoke, drink alcohol, or use illegal drugs. Some items  may interact with your medicine. What should I watch for while using this medicine? Visit your doctor for checks on your progress. This drug may make you feel generally unwell. This is not uncommon, as chemotherapy can affect healthy cells as well as cancer cells. Report any side effects. Continue your course of treatment even though you feel ill unless your doctor tells you to stop. You may get drowsy or dizzy. Do not drive, use machinery, or do anything that needs mental alertness until you know how this medicine affects you. Do not stand or sit up quickly, especially if you are an older patient. This reduces the risk of dizzy or fainting spells. In some cases, you may be given additional medicines to help with side effects. Follow all directions for their use. Call your doctor or health care professional for advice if you get a fever, chills or sore throat, or other symptoms of a cold or flu. Do not treat yourself. This drug decreases your body's ability to fight infections. Try to avoid being around people who are sick. This medicine may increase your risk to bruise or bleed. Call your doctor or health care professional if you notice any unusual bleeding. You may need blood work done while you are taking this medicine. In some patients, this medicine may cause a serious brain infection that may cause death. If you have any problems seeing, thinking, speaking, walking, or standing, tell your doctor right away. If you cannot reach your doctor, urgently seek other source of medical care. Do not become pregnant while taking this medicine. Women should inform their doctor if they wish to become pregnant or think they might be pregnant. There is   a potential for serious side effects to an unborn child. Talk to your health care professional or pharmacist for more information. Do not breast-feed an infant while taking this medicine. Check with your doctor or health care professional if you get an attack of  severe diarrhea, nausea and vomiting, or if you sweat a lot. The loss of too much body fluid can make it dangerous for you to take this medicine. What side effects may I notice from receiving this medicine? Side effects that you should report to your doctor or health care professional as soon as possible: -allergic reactions like skin rash, itching or hives, swelling of the face, lips, or tongue -breathing problems -changes in hearing -changes in vision -fast, irregular heartbeat -feeling faint or lightheaded, falls -pain, tingling, numbness in the hands or feet -right upper belly pain -seizures -swelling of the ankles, feet, hands -unusual bleeding or bruising -unusually weak or tired -vomiting -yellowing of the eyes or skin Side effects that usually do not require medical attention (report to your doctor or health care professional if they continue or are bothersome): -changes in emotions or moods -constipation -diarrhea -loss of appetite -headache -irritation at site where injected -nausea This list may not describe all possible side effects. Call your doctor for medical advice about side effects. You may report side effects to FDA at 1-800-FDA-1088. Where should I keep my medicine? This drug is given in a hospital or clinic and will not be stored at home. NOTE: This sheet is a summary. It may not cover all possible information. If you have questions about this medicine, talk to your doctor, pharmacist, or health care provider.  2015, Elsevier/Gold Standard. (2013-09-24 12:46:32)

## 2014-09-14 ENCOUNTER — Other Ambulatory Visit: Payer: Self-pay | Admitting: Hematology and Oncology

## 2014-09-18 ENCOUNTER — Ambulatory Visit (HOSPITAL_BASED_OUTPATIENT_CLINIC_OR_DEPARTMENT_OTHER): Payer: Medicare Other | Admitting: Hematology and Oncology

## 2014-09-18 ENCOUNTER — Ambulatory Visit (HOSPITAL_BASED_OUTPATIENT_CLINIC_OR_DEPARTMENT_OTHER): Payer: Medicare Other

## 2014-09-18 ENCOUNTER — Encounter: Payer: Self-pay | Admitting: Hematology and Oncology

## 2014-09-18 ENCOUNTER — Other Ambulatory Visit (HOSPITAL_BASED_OUTPATIENT_CLINIC_OR_DEPARTMENT_OTHER): Payer: Medicare Other

## 2014-09-18 VITALS — BP 140/80 | HR 70 | Temp 98.5°F | Resp 18 | Ht 71.0 in | Wt 273.6 lb

## 2014-09-18 DIAGNOSIS — R600 Localized edema: Secondary | ICD-10-CM | POA: Diagnosis not present

## 2014-09-18 DIAGNOSIS — E859 Amyloidosis, unspecified: Secondary | ICD-10-CM | POA: Diagnosis not present

## 2014-09-18 DIAGNOSIS — Z23 Encounter for immunization: Secondary | ICD-10-CM | POA: Diagnosis not present

## 2014-09-18 DIAGNOSIS — E669 Obesity, unspecified: Secondary | ICD-10-CM | POA: Diagnosis not present

## 2014-09-18 DIAGNOSIS — D509 Iron deficiency anemia, unspecified: Secondary | ICD-10-CM

## 2014-09-18 DIAGNOSIS — Z5112 Encounter for antineoplastic immunotherapy: Secondary | ICD-10-CM | POA: Diagnosis not present

## 2014-09-18 LAB — CBC WITH DIFFERENTIAL/PLATELET
BASO%: 0.6 % (ref 0.0–2.0)
Basophils Absolute: 0 10*3/uL (ref 0.0–0.1)
EOS%: 1.7 % (ref 0.0–7.0)
Eosinophils Absolute: 0.1 10*3/uL (ref 0.0–0.5)
HCT: 41.8 % (ref 38.4–49.9)
HGB: 13.2 g/dL (ref 13.0–17.1)
LYMPH%: 15.5 % (ref 14.0–49.0)
MCH: 31.1 pg (ref 27.2–33.4)
MCHC: 31.7 g/dL — AB (ref 32.0–36.0)
MCV: 98.2 fL — AB (ref 79.3–98.0)
MONO#: 0.9 10*3/uL (ref 0.1–0.9)
MONO%: 11.4 % (ref 0.0–14.0)
NEUT#: 5.5 10*3/uL (ref 1.5–6.5)
NEUT%: 70.8 % (ref 39.0–75.0)
PLATELETS: 146 10*3/uL (ref 140–400)
RBC: 4.26 10*6/uL (ref 4.20–5.82)
RDW: 14.8 % — ABNORMAL HIGH (ref 11.0–14.6)
WBC: 7.7 10*3/uL (ref 4.0–10.3)
lymph#: 1.2 10*3/uL (ref 0.9–3.3)

## 2014-09-18 LAB — COMPREHENSIVE METABOLIC PANEL (CC13)
ALK PHOS: 77 U/L (ref 40–150)
ALT: 15 U/L (ref 0–55)
AST: 15 U/L (ref 5–34)
Albumin: 3.1 g/dL — ABNORMAL LOW (ref 3.5–5.0)
Anion Gap: 6 mEq/L (ref 3–11)
BUN: 23.7 mg/dL (ref 7.0–26.0)
CO2: 30 mEq/L — ABNORMAL HIGH (ref 22–29)
Calcium: 9.2 mg/dL (ref 8.4–10.4)
Chloride: 107 mEq/L (ref 98–109)
Creatinine: 1.1 mg/dL (ref 0.7–1.3)
GLUCOSE: 105 mg/dL (ref 70–140)
Potassium: 4.3 mEq/L (ref 3.5–5.1)
Sodium: 143 mEq/L (ref 136–145)
Total Bilirubin: 1 mg/dL (ref 0.20–1.20)
Total Protein: 6 g/dL — ABNORMAL LOW (ref 6.4–8.3)

## 2014-09-18 MED ORDER — ONDANSETRON HCL 8 MG PO TABS
ORAL_TABLET | ORAL | Status: AC
  Filled 2014-09-18: qty 1

## 2014-09-18 MED ORDER — INFLUENZA VAC SPLIT QUAD 0.5 ML IM SUSY
0.5000 mL | PREFILLED_SYRINGE | Freq: Once | INTRAMUSCULAR | Status: AC
  Administered 2014-09-18: 0.5 mL via INTRAMUSCULAR
  Filled 2014-09-18: qty 0.5

## 2014-09-18 MED ORDER — ONDANSETRON HCL 8 MG PO TABS
8.0000 mg | ORAL_TABLET | Freq: Once | ORAL | Status: AC
  Administered 2014-09-18: 8 mg via ORAL

## 2014-09-18 MED ORDER — BORTEZOMIB CHEMO SQ INJECTION 3.5 MG (2.5MG/ML)
1.3000 mg/m2 | Freq: Once | INTRAMUSCULAR | Status: AC
  Administered 2014-09-18: 3.25 mg via SUBCUTANEOUS
  Filled 2014-09-18: qty 3.25

## 2014-09-18 NOTE — Assessment & Plan Note (Signed)
I discussed with the patient the importance of weight loss. He wants to try to exercise and modify his diet with target weight of 270 pounds by next month. He has increased appetite due to steroids use.

## 2014-09-18 NOTE — Patient Instructions (Signed)
[facility] Discharge Instructions for Patients Receiving Chemotherapy  Today you received the following chemotherapy agent: Velcade   To help prevent nausea and vomiting after your treatment, we encourage you to take your nausea medication as prescribed.    If you develop nausea and vomiting that is not controlled by your nausea medication, call the clinic.   BELOW ARE SYMPTOMS THAT SHOULD BE REPORTED IMMEDIATELY:  *FEVER GREATER THAN 100.5 F  *CHILLS WITH OR WITHOUT FEVER  NAUSEA AND VOMITING THAT IS NOT CONTROLLED WITH YOUR NAUSEA MEDICATION  *UNUSUAL SHORTNESS OF BREATH  *UNUSUAL BRUISING OR BLEEDING  TENDERNESS IN MOUTH AND THROAT WITH OR WITHOUT PRESENCE OF ULCERS  *URINARY PROBLEMS  *BOWEL PROBLEMS  UNUSUAL RASH Items with * indicate a potential emergency and should be followed up as soon as possible.  Feel free to call the clinic you have any questions or concerns. The clinic phone number is [phone].

## 2014-09-18 NOTE — Progress Notes (Signed)
[place] [facility] OFFICE PROGRESS NOTE  Patient Care Team: Lelon Perla, MD (Cardiology) Heath Lark, MD as Consulting Physician (Hematology and Oncology)  SUMMARY OF ONCOLOGIC HISTORY: He was diagnosed with systemic amyloidosis after presentation with shortness of breath. He has significant evaluation including bone marrow aspirate and biopsy. 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 The patient subsequently went for maintain dense chemotherapy with Velcade for almost 2 years and then switch to Revlimid. He self discontinue Revlimid in May of 2014 due to fatigue. In July 2015, we restarted him back on Velcade due to a rising light chain. In September 2015, the patient requested to add back dexamethasone at 20 mg every week.  INTERVAL HISTORY: Please see below for problem oriented charting. He is seen prior to chemotherapy. He has mild bilateral leg edema.  REVIEW OF SYSTEMS:   Constitutional: Denies fevers, chills or abnormal weight loss Eyes: Denies blurriness of vision Ears, nose, mouth, throat, and face: Denies mucositis or sore throat Respiratory: Denies cough, dyspnea or wheezes Cardiovascular: Denies palpitation, chest discomfort Gastrointestinal:  Denies nausea, heartburn or change in bowel habits Skin: Denies abnormal skin rashes Lymphatics: Denies new lymphadenopathy. He has chronic easy bruising Neurological:Denies numbness, tingling or new weaknesses Behavioral/Psych: Mood is stable, no new changes  All other systems were reviewed with the patient and are negative.  I have reviewed the past medical history, past surgical history, social history and family history with the patient and they are unchanged from previous note.  ALLERGIES:  has No  Known Allergies.  MEDICATIONS:  Current Outpatient Prescriptions  Medication Sig Dispense Refill  . acyclovir (ZOVIRAX) 400 MG tablet Take 400 mg by mouth once.      Marland Kitchen amiodarone (PACERONE) 200 MG tablet Take 1 tablet (200 mg total) by mouth daily.  30 tablet  11  . dexamethasone (DECADRON) 4 MG tablet Take 5 tablets every week on Wednesdays  60 tablet  3  . furosemide (LASIX) 40 MG tablet Take 1 tablet (40 mg total) by mouth daily.  90 tablet  3  . furosemide (LASIX) 40 MG tablet TAKE ONE TABLET BY MOUTH TWICE DAILY  180 tablet  0  . nitroGLYCERIN (NITROSTAT) 0.4 MG SL tablet Place 1 tablet (0.4 mg total) under the tongue every 5 (five) minutes as needed for chest pain.  25 tablet  12  . pravastatin (PRAVACHOL) 40 MG tablet Take 1 tablet (40 mg total) by mouth every evening.  30 tablet  11  . rivaroxaban (XARELTO) 20 MG TABS tablet Take 1 tablet (20 mg total) by mouth daily with supper.  90 tablet  3   No current facility-administered medications for this visit.    PHYSICAL EXAMINATION: ECOG PERFORMANCE STATUS: 1 - Symptomatic but completely ambulatory  Filed Vitals:   09/18/14 0858  BP: 140/80  Pulse: 70  Temp: 98.5 F (36.9 C)  Resp: 18   Filed Weights   09/18/14 0858  Weight: 273 lb 9.6 oz (124.104 kg)    GENERAL:alert, no distress and comfortable SKIN: skin color, texture, turgor are normal, no rashes or significant lesions EYES: normal, Conjunctiva are pink and non-injected, sclera clear OROPHARYNX:no exudate, no erythema and lips, buccal mucosa, and tongue normal  NECK: supple, thyroid normal size, non-tender, without nodularity LYMPH:  no palpable lymphadenopathy in the cervical, axillary or  inguinal LUNGS: clear to auscultation and percussion with normal breathing effort HEART: regular rate & rhythm and no murmurs with mild bilateral lower extremity edema ABDOMEN:abdomen soft, non-tender and normal bowel sounds Musculoskeletal:no cyanosis of digits and no clubbing   NEURO: alert & oriented x 3 with fluent speech, no focal motor/sensory deficits  LABORATORY DATA:  I have reviewed the data as listed    Component Value Date/Time   NA 143 09/18/2014 0848   NA 138 06/10/2014 1221   K 4.3 09/18/2014 0848   K 3.6 06/10/2014 1221   CL 102 06/10/2014 1221   CL 103 05/31/2013 0842   CO2 30* 09/18/2014 0848   CO2 28 06/10/2014 1221   GLUCOSE 105 09/18/2014 0848   GLUCOSE 150* 06/10/2014 1221   GLUCOSE 118* 05/31/2013 0842   BUN 23.7 09/18/2014 0848   BUN 18 06/10/2014 1221   CREATININE 1.1 09/18/2014 0848   CREATININE 1.13 06/10/2014 1221   CREATININE 1.1 01/31/2014 1613   CREATININE 0.92 12/17/2011 1606   CALCIUM 9.2 09/18/2014 0848   CALCIUM 8.3* 06/10/2014 1221   PROT 6.0* 09/18/2014 0848   PROT 6.0 06/10/2014 1221   ALBUMIN 3.1* 09/18/2014 0848   ALBUMIN 3.6 06/10/2014 1221   AST 15 09/18/2014 0848   AST 18 06/10/2014 1221   ALT 15 09/18/2014 0848   ALT 10 06/10/2014 1221   ALKPHOS 77 09/18/2014 0848   ALKPHOS 87 06/10/2014 1221   BILITOT 1.00 09/18/2014 0848   BILITOT 0.8 06/10/2014 1221   GFRNONAA 63 06/10/2014 1221   GFRNONAA 45* 06/01/2013 0548   GFRAA 73 06/10/2014 1221   GFRAA 52* 06/01/2013 0548    No results found for this basename: SPEP,  UPEP,   kappa and lambda light chains    Lab Results  Component Value Date   WBC 7.7 09/18/2014   NEUTROABS 5.5 09/18/2014   HGB 13.2 09/18/2014   HCT 41.8 09/18/2014   MCV 98.2* 09/18/2014   PLT 146 09/18/2014      Chemistry      Component Value Date/Time   NA 143 09/18/2014 0848   NA 138 06/10/2014 1221   K 4.3 09/18/2014 0848   K 3.6 06/10/2014 1221   CL 102 06/10/2014 1221   CL 103 05/31/2013 0842   CO2 30* 09/18/2014 0848   CO2 28 06/10/2014 1221   BUN 23.7 09/18/2014 0848   BUN 18 06/10/2014 1221   CREATININE 1.1 09/18/2014 0848   CREATININE 1.13 06/10/2014 1221   CREATININE 1.1 01/31/2014 1613   CREATININE 0.92 12/17/2011 1606      Component Value Date/Time   CALCIUM 9.2 09/18/2014 0848   CALCIUM 8.3* 06/10/2014  1221   ALKPHOS 77 09/18/2014 0848   ALKPHOS 87 06/10/2014 1221   AST 15 09/18/2014 0848   AST 18 06/10/2014 1221   ALT 15 09/18/2014 0848   ALT 10 06/10/2014 1221   BILITOT 1.00 09/18/2014 0848   BILITOT 0.8 06/10/2014 1221      ASSESSMENT & PLAN:  Amyloidosis Clinically, he has no signs of end organ damage. The patient is recommended to continue taking acyclovir for antimicrobial prophylaxis while on Velcade. He tolerates treatment well without any side effects so far. I would not recommend him to take dexamethasone due to the risk of worsening fluid retention and leg edema. Dexamethasone probably would contribute very little to the overall treatment of his amyloidosis but the patient insisted and started taking 20 mg weekly. We will continue current treatment plan without adjustment  Pedal edema This is likely related to diastolic heart failure and fluid retention. Rather than subject him to more medication, I recommend dietary modification, exercise and weight loss. I told him dexamethasone will likely exacerbate this further but the patient is not willing to listen.   Obesity I discussed with the patient the importance of weight loss. He wants to try to exercise and modify his diet with target weight of 270 pounds by next month. He has increased appetite due to steroids use.    Orders Placed This Encounter  Procedures  . SPEP & IFE with QIG    Standing Status: Future     Number of Occurrences:      Standing Expiration Date: 10/23/2015  . Kappa/lambda light chains    Standing Status: Future     Number of Occurrences:      Standing Expiration Date: 10/23/2015   All questions were answered. The patient knows to call the clinic with any problems, questions or concerns. No barriers to learning was detected. I spent 25 minutes counseling the patient face to face. The total time spent in the appointment was 30 minutes and more than 50% was on counseling and review of test results      [facility], [place], MD 09/18/2014 7:02 PM

## 2014-09-18 NOTE — Assessment & Plan Note (Signed)
This is likely related to diastolic heart failure and fluid retention. Rather than subject him to more medication, I recommend dietary modification, exercise and weight loss. I told him dexamethasone will likely exacerbate this further but the patient is not willing to listen.

## 2014-09-18 NOTE — Assessment & Plan Note (Signed)
Clinically, he has no signs of end organ damage. The patient is recommended to continue taking acyclovir for antimicrobial prophylaxis while on Velcade. He tolerates treatment well without any side effects so far. I would not recommend him to take dexamethasone due to the risk of worsening fluid retention and leg edema. Dexamethasone probably would contribute very little to the overall treatment of his amyloidosis but the patient insisted and started taking 20 mg weekly. We will continue current treatment plan without adjustment

## 2014-09-19 ENCOUNTER — Telehealth: Payer: Self-pay | Admitting: Hematology and Oncology

## 2014-09-19 NOTE — Telephone Encounter (Signed)
s.w. pt and advised on OCT and NOV appt....pt is going to get all appts from My chart.

## 2014-09-25 ENCOUNTER — Other Ambulatory Visit (HOSPITAL_BASED_OUTPATIENT_CLINIC_OR_DEPARTMENT_OTHER): Payer: Medicare Other

## 2014-09-25 ENCOUNTER — Ambulatory Visit (HOSPITAL_BASED_OUTPATIENT_CLINIC_OR_DEPARTMENT_OTHER): Payer: Medicare Other

## 2014-09-25 VITALS — BP 140/50 | HR 79 | Temp 98.6°F

## 2014-09-25 DIAGNOSIS — E859 Amyloidosis, unspecified: Secondary | ICD-10-CM

## 2014-09-25 DIAGNOSIS — Z5112 Encounter for antineoplastic immunotherapy: Secondary | ICD-10-CM

## 2014-09-25 LAB — COMPREHENSIVE METABOLIC PANEL (CC13)
ALK PHOS: 89 U/L (ref 40–150)
ALT: 15 U/L (ref 0–55)
AST: 12 U/L (ref 5–34)
Albumin: 3.1 g/dL — ABNORMAL LOW (ref 3.5–5.0)
Anion Gap: 9 mEq/L (ref 3–11)
BUN: 20.9 mg/dL (ref 7.0–26.0)
CO2: 28 mEq/L (ref 22–29)
Calcium: 9.1 mg/dL (ref 8.4–10.4)
Chloride: 107 mEq/L (ref 98–109)
Creatinine: 1.3 mg/dL (ref 0.7–1.3)
Glucose: 114 mg/dl (ref 70–140)
POTASSIUM: 4.3 meq/L (ref 3.5–5.1)
SODIUM: 143 meq/L (ref 136–145)
TOTAL PROTEIN: 6 g/dL — AB (ref 6.4–8.3)
Total Bilirubin: 0.68 mg/dL (ref 0.20–1.20)

## 2014-09-25 LAB — CBC WITH DIFFERENTIAL/PLATELET
BASO%: 0.7 % (ref 0.0–2.0)
Basophils Absolute: 0.1 10*3/uL (ref 0.0–0.1)
EOS%: 1.4 % (ref 0.0–7.0)
Eosinophils Absolute: 0.1 10*3/uL (ref 0.0–0.5)
HCT: 41.1 % (ref 38.4–49.9)
HGB: 13.1 g/dL (ref 13.0–17.1)
LYMPH%: 13.2 % — ABNORMAL LOW (ref 14.0–49.0)
MCH: 31.6 pg (ref 27.2–33.4)
MCHC: 31.9 g/dL — ABNORMAL LOW (ref 32.0–36.0)
MCV: 99 fL — AB (ref 79.3–98.0)
MONO#: 1 10*3/uL — ABNORMAL HIGH (ref 0.1–0.9)
MONO%: 13.8 % (ref 0.0–14.0)
NEUT%: 70.9 % (ref 39.0–75.0)
NEUTROS ABS: 5 10*3/uL (ref 1.5–6.5)
Platelets: 151 10*3/uL (ref 140–400)
RBC: 4.15 10*6/uL — AB (ref 4.20–5.82)
RDW: 14.9 % — AB (ref 11.0–14.6)
WBC: 7.1 10*3/uL (ref 4.0–10.3)
lymph#: 0.9 10*3/uL (ref 0.9–3.3)

## 2014-09-25 MED ORDER — ONDANSETRON HCL 8 MG PO TABS
8.0000 mg | ORAL_TABLET | Freq: Once | ORAL | Status: AC
  Administered 2014-09-25: 8 mg via ORAL

## 2014-09-25 MED ORDER — BORTEZOMIB CHEMO SQ INJECTION 3.5 MG (2.5MG/ML)
1.3000 mg/m2 | Freq: Once | INTRAMUSCULAR | Status: AC
  Administered 2014-09-25: 3.25 mg via SUBCUTANEOUS
  Filled 2014-09-25: qty 3.25

## 2014-09-25 MED ORDER — ONDANSETRON HCL 8 MG PO TABS
ORAL_TABLET | ORAL | Status: AC
  Filled 2014-09-25: qty 1

## 2014-09-25 NOTE — Patient Instructions (Signed)
[facility] Discharge Instructions for Patients Receiving Chemotherapy  Today you received the following chemotherapy agents: Velcade.  To help prevent nausea and vomiting after your treatment, we encourage you to take your nausea medication as prescribed.   If you develop nausea and vomiting that is not controlled by your nausea medication, call the clinic.   BELOW ARE SYMPTOMS THAT SHOULD BE REPORTED IMMEDIATELY:  *FEVER GREATER THAN 100.5 F  *CHILLS WITH OR WITHOUT FEVER  NAUSEA AND VOMITING THAT IS NOT CONTROLLED WITH YOUR NAUSEA MEDICATION  *UNUSUAL SHORTNESS OF BREATH  *UNUSUAL BRUISING OR BLEEDING  TENDERNESS IN MOUTH AND THROAT WITH OR WITHOUT PRESENCE OF ULCERS  *URINARY PROBLEMS  *BOWEL PROBLEMS  UNUSUAL RASH Items with * indicate a potential emergency and should be followed up as soon as possible.  Feel free to call the clinic you have any questions or concerns. The clinic phone number is [phone].    

## 2014-09-27 ENCOUNTER — Other Ambulatory Visit: Payer: Self-pay

## 2014-09-27 LAB — SPEP & IFE WITH QIG
ALPHA-2-GLOBULIN: 16.2 % — AB (ref 7.1–11.8)
Albumin ELP: 56.4 % (ref 55.8–66.1)
Alpha-1-Globulin: 5.1 % — ABNORMAL HIGH (ref 2.9–4.9)
Beta 2: 5.8 % (ref 3.2–6.5)
Beta Globulin: 5.7 % (ref 4.7–7.2)
Gamma Globulin: 10.8 % — ABNORMAL LOW (ref 11.1–18.8)
IGG (IMMUNOGLOBIN G), SERUM: 591 mg/dL — AB (ref 650–1600)
IgA: 126 mg/dL (ref 68–379)
IgM, Serum: 27 mg/dL — ABNORMAL LOW (ref 41–251)
Total Protein, Serum Electrophoresis: 5.5 g/dL — ABNORMAL LOW (ref 6.0–8.3)

## 2014-09-27 LAB — KAPPA/LAMBDA LIGHT CHAINS
KAPPA FREE LGHT CHN: 3.39 mg/dL — AB (ref 0.33–1.94)
Kappa:Lambda Ratio: 2.05 — ABNORMAL HIGH (ref 0.26–1.65)
Lambda Free Lght Chn: 1.65 mg/dL (ref 0.57–2.63)

## 2014-09-30 ENCOUNTER — Ambulatory Visit (INDEPENDENT_AMBULATORY_CARE_PROVIDER_SITE_OTHER): Payer: Medicare Other | Admitting: Cardiology

## 2014-09-30 ENCOUNTER — Telehealth: Payer: Self-pay | Admitting: Cardiology

## 2014-09-30 ENCOUNTER — Encounter: Payer: Self-pay | Admitting: Cardiology

## 2014-09-30 VITALS — BP 138/70 | HR 69 | Ht 71.0 in | Wt 267.1 lb

## 2014-09-30 DIAGNOSIS — E859 Amyloidosis, unspecified: Secondary | ICD-10-CM

## 2014-09-30 DIAGNOSIS — R0602 Shortness of breath: Secondary | ICD-10-CM

## 2014-09-30 DIAGNOSIS — I471 Supraventricular tachycardia: Secondary | ICD-10-CM | POA: Diagnosis not present

## 2014-09-30 DIAGNOSIS — I5033 Acute on chronic diastolic (congestive) heart failure: Secondary | ICD-10-CM | POA: Insufficient documentation

## 2014-09-30 DIAGNOSIS — I10 Essential (primary) hypertension: Secondary | ICD-10-CM

## 2014-09-30 DIAGNOSIS — I5032 Chronic diastolic (congestive) heart failure: Secondary | ICD-10-CM

## 2014-09-30 DIAGNOSIS — I2510 Atherosclerotic heart disease of native coronary artery without angina pectoris: Secondary | ICD-10-CM | POA: Diagnosis not present

## 2014-09-30 DIAGNOSIS — E7800 Pure hypercholesterolemia, unspecified: Secondary | ICD-10-CM

## 2014-09-30 DIAGNOSIS — I4891 Unspecified atrial fibrillation: Secondary | ICD-10-CM

## 2014-09-30 MED ORDER — FUROSEMIDE 40 MG PO TABS: 80.0000 mg | ORAL_TABLET | Freq: Two times a day (BID) | ORAL | Status: DC

## 2014-09-30 NOTE — Addendum Note (Signed)
Addended by: Cristopher Estimable on: 09/30/2014 11:16 AM   Modules accepted: Orders

## 2014-09-30 NOTE — Patient Instructions (Addendum)
Your physician recommends that you schedule a follow-up appointment in: [place] 80 MG OF FUROSEMIDE TODAY  INCREASE FUROSEMIDE TO 40 MG TWICE DAILY= 2 OF THE 20 MG TABLETS TWICE DAILY  Your physician recommends that you return for lab work Wednesday NEXT WEEK=BMP

## 2014-09-30 NOTE — Assessment & Plan Note (Signed)
Patient remains in sinus rhythm. Continue amiodarone. Continue Coumadin.

## 2014-09-30 NOTE — Telephone Encounter (Signed)
Spoke with pt, he will be seen today by dr [name]

## 2014-09-30 NOTE — Assessment & Plan Note (Signed)
Continue amiodarone. 

## 2014-09-30 NOTE — Assessment & Plan Note (Signed)
Continue statin. Not on aspirin given need for Coumadin. 

## 2014-09-30 NOTE — Assessment & Plan Note (Signed)
Blood pressure controlled. Continue present medications. 

## 2014-09-30 NOTE — Progress Notes (Signed)
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 LHC 10/2009: pLAD 20%, dLAD 30%, mOM2 70-80% (unchanged compared to previous), pRCA 20%, dRCA 20%. Cardiac MRI in January of 2011 showed an ejection fraction of 57%, mild biatrial enlargement, mild right ventricular enlargement. Cardiac amyloid could not be definitively excluded.  Admitted in February of 2012 with a wide complex tachycardia. Seen by EP and rhythm was most likely felt to be SVT with aberrancy. It was felt that if symptoms recur ablation would be warranted.  Echo 10/2012: Abnormal septal motion from ?bundle branch block, moderate LVH, EF 55-60%, mild MR, moderate LAE mild RAE, PASP 53.  Cardiac MRI (09/05/13): Moderate LVE, focal septal hypertrophy, EF 51%, global HK, mild RVE, normal RVSF, no evidence of cardiac amyloid. He has had some increase in his light chains on electrophoresis. This is being followed. Nuclear study in March of 2015 showed apical thinning but no ischemia. Ejection fraction was 48%. Since last seen, Patient complains of increased dyspnea on exertion, orthopnea and pedal edema. No chest pain, palpitations or syncope.   Current Outpatient Prescriptions  Medication Sig Dispense Refill  . acyclovir (ZOVIRAX) 400 MG tablet Take 400 mg by mouth once.      Marland Kitchen amiodarone (PACERONE) 200 MG tablet Take 1 tablet (200 mg total) by mouth daily.  30 tablet  11  . dexamethasone (DECADRON) 4 MG tablet Take 5 tablets every week on Wednesdays  60 tablet  3  . furosemide (LASIX) 40 MG tablet Take 1 tablet (40 mg total) by mouth daily.  90 tablet  3  . nitroGLYCERIN (NITROSTAT) 0.4 MG SL tablet Place 1 tablet (0.4 mg total) under the tongue every 5 (five) minutes as needed for chest pain.  25 tablet  12  . pravastatin (PRAVACHOL) 40 MG tablet Take 1 tablet (40 mg total) by mouth every evening.  30 tablet  11  . rivaroxaban (XARELTO) 20 MG TABS tablet Take 1 tablet (20 mg total)  by mouth daily with supper.  90 tablet  3   No current facility-administered medications for this visit.     Past Medical History  Diagnosis Date  . Hypertension   . Coronary atherosclerosis of unspecified type of vessel, native or graft     Non obstructive  . Cerebrovascular disease, unspecified   . Pure hypercholesterolemia   . Diverticulosis of colon (without mention of hemorrhage)   . Benign neoplasm of colon   . Unspecified hemorrhoids without mention of complication   . Degenerative disc disease   . 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 Hemangioma   . Amyloidosis   . LBBB (left bundle branch block)   . 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 OSA (obstructive sleep apnea)   . Atrial fibrillation   . Cancer     amyloidosis  . Edema 08/29/2013  . Hx of cardiovascular stress test     Adenosine Myoview (02/2014): Apical thinning, no ischemia, EF 48%; low risk.    Past Surgical History  Procedure Laterality Date  . Knee arthroscopy    . Carpal tunnel release  08/2006    Dr Doy Mince at [facility] - Bilateral  . 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 Back surgery      Lumbar  . Cardioversion N/A 07/10/2013    Procedure: CARDIOVERSION;  Surgeon: Lelon Perla, MD;  Location: [facility] ENDOSCOPY;  Service: Cardiovascular;  Laterality: N/A;    History   Social History  . Marital Status: Married    Spouse Name: N/A    Number of Children: 2  . Years of Education: N/A   Occupational History  . PRESIDENT     [employer]   Social History Main Topics  . Smoking status: Never Smoker   . Smokeless tobacco: Never Used  . Alcohol Use: Yes  . Drug Use: No  . Sexual Activity: No   Other Topics Concern  . Not on file   Social History Narrative   Lives with wife.      ROS: no fevers or chills, productive cough, hemoptysis, dysphasia, odynophagia, melena, hematochezia, dysuria, hematuria, rash, seizure activity,  orthopnea, PND, pedal edema, claudication. Remaining systems are negative.  Physical Exam: Well-developed well-nourished in no acute distress.  Skin is warm and dry.  HEENT is normal.  Neck is supple.  Chest is clear to auscultation with normal expansion.  Cardiovascular exam is regular rate and rhythm.  Abdominal exam nontender or distended. No masses palpated. Extremities show 2+ edema. neuro grossly intact  ECG Sinus rhythm with first degree AV block. Left bundle branch block.

## 2014-09-30 NOTE — Telephone Encounter (Signed)
Please call,sob,hard to breath.Needs to be seen or maybe go to the hospital.

## 2014-09-30 NOTE — Assessment & Plan Note (Signed)
Continue statin. 

## 2014-09-30 NOTE — Telephone Encounter (Signed)
Returned call to patient he stated he is sob.Stated started last night had to sleep in a chair could not lie flat.Stated he is unable to walk a few feet he is sob.Weight stable, no swelling noticed.Advised he needs to go to [facility] ER.Patient refused wants to see Dr.Crenshaw.Stated he took furosemide 40 mg this morning has not took a extra 40 mg.Stated he thinks he needs his medication adjusted.Message sent to Dr.Crenshaw's nurse Fredia Beets RN.

## 2014-09-30 NOTE — Assessment & Plan Note (Addendum)
Patient presented today as an add-on for increasing dyspnea. He is volume overloaded. He remains in sinus rhythm. I have asked him to increase Lasix. He will take an additional 80 mg today and then begin 40 mg twice a day tomorrow. Check potassium and renal function on Wednesday, October 21. I've instructed him on low sodium diet. He will weigh himself and keep records. If he worsens have asked him to go to the emergency room. He feels as though he can be diuresed at home at present. Note he is taking Decadron which certainly could be contributing to fluid retention.

## 2014-10-01 DIAGNOSIS — R05 Cough: Secondary | ICD-10-CM | POA: Diagnosis not present

## 2014-10-01 DIAGNOSIS — R0601 Orthopnea: Secondary | ICD-10-CM | POA: Diagnosis not present

## 2014-10-02 ENCOUNTER — Other Ambulatory Visit: Payer: Self-pay | Admitting: Hematology and Oncology

## 2014-10-02 ENCOUNTER — Other Ambulatory Visit (HOSPITAL_BASED_OUTPATIENT_CLINIC_OR_DEPARTMENT_OTHER): Payer: Medicare Other

## 2014-10-02 ENCOUNTER — Ambulatory Visit (HOSPITAL_BASED_OUTPATIENT_CLINIC_OR_DEPARTMENT_OTHER): Payer: Medicare Other

## 2014-10-02 VITALS — BP 142/52 | HR 74 | Temp 98.0°F | Resp 18

## 2014-10-02 DIAGNOSIS — R0602 Shortness of breath: Secondary | ICD-10-CM | POA: Diagnosis not present

## 2014-10-02 DIAGNOSIS — E859 Amyloidosis, unspecified: Secondary | ICD-10-CM | POA: Diagnosis not present

## 2014-10-02 DIAGNOSIS — Z5112 Encounter for antineoplastic immunotherapy: Secondary | ICD-10-CM | POA: Diagnosis not present

## 2014-10-02 LAB — COMPREHENSIVE METABOLIC PANEL
ALT: 14 U/L (ref 0–53)
AST: 17 U/L (ref 0–37)
Albumin: 3.4 g/dL — ABNORMAL LOW (ref 3.5–5.2)
Alkaline Phosphatase: 93 U/L (ref 39–117)
BUN: 23 mg/dL (ref 6–23)
CHLORIDE: 98 meq/L (ref 96–112)
CO2: 33 meq/L — AB (ref 19–32)
CREATININE: 1.41 mg/dL — AB (ref 0.50–1.35)
Calcium: 9 mg/dL (ref 8.4–10.5)
Glucose, Bld: 121 mg/dL — ABNORMAL HIGH (ref 70–99)
Potassium: 3.9 mEq/L (ref 3.5–5.3)
Sodium: 142 mEq/L (ref 135–145)
Total Bilirubin: 0.8 mg/dL (ref 0.3–1.2)
Total Protein: 6.7 g/dL (ref 6.0–8.3)

## 2014-10-02 LAB — CBC WITH DIFFERENTIAL/PLATELET
BASO%: 1.2 % (ref 0.0–2.0)
BASOS ABS: 0.1 10*3/uL (ref 0.0–0.1)
EOS%: 0.8 % (ref 0.0–7.0)
Eosinophils Absolute: 0.1 10*3/uL (ref 0.0–0.5)
HEMATOCRIT: 43.6 % (ref 38.4–49.9)
HEMOGLOBIN: 13.9 g/dL (ref 13.0–17.1)
LYMPH%: 14.1 % (ref 14.0–49.0)
MCH: 31.2 pg (ref 27.2–33.4)
MCHC: 31.9 g/dL — ABNORMAL LOW (ref 32.0–36.0)
MCV: 97.8 fL (ref 79.3–98.0)
MONO#: 1 10*3/uL — ABNORMAL HIGH (ref 0.1–0.9)
MONO%: 9.7 % (ref 0.0–14.0)
NEUT#: 7.5 10*3/uL — ABNORMAL HIGH (ref 1.5–6.5)
NEUT%: 74.2 % (ref 39.0–75.0)
Platelets: 196 10*3/uL (ref 140–400)
RBC: 4.47 10*6/uL (ref 4.20–5.82)
RDW: 14.6 % (ref 11.0–14.6)
WBC: 10.1 10*3/uL (ref 4.0–10.3)
lymph#: 1.4 10*3/uL (ref 0.9–3.3)

## 2014-10-02 MED ORDER — ONDANSETRON HCL 8 MG PO TABS
ORAL_TABLET | ORAL | Status: AC
  Filled 2014-10-02: qty 1

## 2014-10-02 MED ORDER — BORTEZOMIB CHEMO SQ INJECTION 3.5 MG (2.5MG/ML)
1.3000 mg/m2 | Freq: Once | INTRAMUSCULAR | Status: AC
  Administered 2014-10-02: 3.25 mg via SUBCUTANEOUS
  Filled 2014-10-02: qty 3.25

## 2014-10-02 MED ORDER — ONDANSETRON HCL 8 MG PO TABS
8.0000 mg | ORAL_TABLET | Freq: Once | ORAL | Status: AC
  Administered 2014-10-02: 8 mg via ORAL

## 2014-10-02 NOTE — Patient Instructions (Signed)
[facility] Discharge Instructions for Patients Receiving Chemotherapy  Today you received the following chemotherapy agents: Velcade   To help prevent nausea and vomiting after your treatment, we encourage you to take your nausea medication as prescribed by your physician.    If you develop nausea and vomiting that is not controlled by your nausea medication, call the clinic.   BELOW ARE SYMPTOMS THAT SHOULD BE REPORTED IMMEDIATELY:  *FEVER GREATER THAN 100.5 F  *CHILLS WITH OR WITHOUT FEVER  NAUSEA AND VOMITING THAT IS NOT CONTROLLED WITH YOUR NAUSEA MEDICATION  *UNUSUAL SHORTNESS OF BREATH  *UNUSUAL BRUISING OR BLEEDING  TENDERNESS IN MOUTH AND THROAT WITH OR WITHOUT PRESENCE OF ULCERS  *URINARY PROBLEMS  *BOWEL PROBLEMS  UNUSUAL RASH Items with * indicate a potential emergency and should be followed up as soon as possible.  Feel free to call the clinic you have any questions or concerns. The clinic phone number is [phone].

## 2014-10-08 ENCOUNTER — Telehealth: Payer: Self-pay | Admitting: Cardiology

## 2014-10-08 NOTE — Telephone Encounter (Signed)
Received 5 pages records from Dr Amanda Pea [facility]--for patient appointment on 10/10/14 with Dr [name].  Records given to [facility] (medical records) for Dr Jacalyn Lefevre schedule of 10/10/14 lp

## 2014-10-09 ENCOUNTER — Ambulatory Visit (HOSPITAL_BASED_OUTPATIENT_CLINIC_OR_DEPARTMENT_OTHER): Payer: Medicare Other

## 2014-10-09 ENCOUNTER — Other Ambulatory Visit (HOSPITAL_BASED_OUTPATIENT_CLINIC_OR_DEPARTMENT_OTHER): Payer: Medicare Other

## 2014-10-09 VITALS — BP 136/68 | HR 78 | Temp 98.1°F | Resp 18

## 2014-10-09 DIAGNOSIS — E859 Amyloidosis, unspecified: Secondary | ICD-10-CM | POA: Diagnosis not present

## 2014-10-09 DIAGNOSIS — Z5112 Encounter for antineoplastic immunotherapy: Secondary | ICD-10-CM | POA: Diagnosis not present

## 2014-10-09 LAB — COMPREHENSIVE METABOLIC PANEL (CC13)
ALT: 21 U/L (ref 0–55)
ANION GAP: 10 meq/L (ref 3–11)
AST: 16 U/L (ref 5–34)
Albumin: 3.4 g/dL — ABNORMAL LOW (ref 3.5–5.0)
Alkaline Phosphatase: 93 U/L (ref 40–150)
BUN: 32.1 mg/dL — AB (ref 7.0–26.0)
CALCIUM: 9.3 mg/dL (ref 8.4–10.4)
CHLORIDE: 105 meq/L (ref 98–109)
CO2: 26 meq/L (ref 22–29)
Creatinine: 1.4 mg/dL — ABNORMAL HIGH (ref 0.7–1.3)
GLUCOSE: 128 mg/dL (ref 70–140)
Potassium: 4.4 mEq/L (ref 3.5–5.1)
Sodium: 141 mEq/L (ref 136–145)
Total Bilirubin: 0.49 mg/dL (ref 0.20–1.20)
Total Protein: 6.4 g/dL (ref 6.4–8.3)

## 2014-10-09 LAB — CBC WITH DIFFERENTIAL/PLATELET
BASO%: 0.1 % (ref 0.0–2.0)
BASOS ABS: 0 10*3/uL (ref 0.0–0.1)
EOS%: 0 % (ref 0.0–7.0)
Eosinophils Absolute: 0 10*3/uL (ref 0.0–0.5)
HEMATOCRIT: 43.2 % (ref 38.4–49.9)
HGB: 13.8 g/dL (ref 13.0–17.1)
LYMPH#: 0.6 10*3/uL — AB (ref 0.9–3.3)
LYMPH%: 5.8 % — ABNORMAL LOW (ref 14.0–49.0)
MCH: 31.3 pg (ref 27.2–33.4)
MCHC: 32 g/dL (ref 32.0–36.0)
MCV: 98 fL (ref 79.3–98.0)
MONO#: 0.1 10*3/uL (ref 0.1–0.9)
MONO%: 1.1 % (ref 0.0–14.0)
NEUT#: 9.8 10*3/uL — ABNORMAL HIGH (ref 1.5–6.5)
NEUT%: 93 % — AB (ref 39.0–75.0)
PLATELETS: 192 10*3/uL (ref 140–400)
RBC: 4.41 10*6/uL (ref 4.20–5.82)
RDW: 14.6 % (ref 11.0–14.6)
WBC: 10.5 10*3/uL — AB (ref 4.0–10.3)

## 2014-10-09 MED ORDER — ONDANSETRON HCL 8 MG PO TABS
8.0000 mg | ORAL_TABLET | Freq: Once | ORAL | Status: AC
  Administered 2014-10-09: 8 mg via ORAL

## 2014-10-09 MED ORDER — BORTEZOMIB CHEMO SQ INJECTION 3.5 MG (2.5MG/ML)
1.3000 mg/m2 | Freq: Once | INTRAMUSCULAR | Status: AC
  Administered 2014-10-09: 3.25 mg via SUBCUTANEOUS
  Filled 2014-10-09: qty 3.25

## 2014-10-09 MED ORDER — ONDANSETRON HCL 8 MG PO TABS
ORAL_TABLET | ORAL | Status: AC
  Filled 2014-10-09: qty 1

## 2014-10-09 NOTE — Progress Notes (Signed)
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 LHC 10/2009: pLAD 20%, dLAD 30%, mOM2 70-80% (unchanged compared to previous), pRCA 20%, dRCA 20%. Cardiac MRI in January of 2011 showed an ejection fraction of 57%, mild biatrial enlargement, mild right ventricular enlargement. Cardiac amyloid could not be definitively excluded.  Admitted in February of 2012 with a wide complex tachycardia. Seen by EP and rhythm was most likely felt to be SVT with aberrancy. It was felt that if symptoms recur ablation would be warranted.  Echo 10/2012: Abnormal septal motion from ?bundle branch block, moderate LVH, EF 55-60%, mild MR, moderate LAE mild RAE, PASP 53.  Cardiac MRI (09/05/13): Moderate LVE, focal septal hypertrophy, EF 51%, global HK, mild RVE, normal RVSF, no evidence of cardiac amyloid. He has had some increase in his light chains on electrophoresis. This is being followed. Nuclear study in March of 2015 showed apical thinning but no ischemia. Ejection fraction was 48%. At last Ov we increased lasix due to volume excess. Since then,    Current Outpatient Prescriptions  Medication Sig Dispense Refill  . acyclovir (ZOVIRAX) 400 MG tablet Take 400 mg by mouth once.      Marland Kitchen amiodarone (PACERONE) 200 MG tablet Take 1 tablet (200 mg total) by mouth daily.  30 tablet  11  . dexamethasone (DECADRON) 4 MG tablet Take 5 tablets every week on Wednesdays  60 tablet  3  . furosemide (LASIX) 40 MG tablet Take 2 tablets (80 mg total) by mouth 2 (two) times daily.  90 tablet  3  . nitroGLYCERIN (NITROSTAT) 0.4 MG SL tablet Place 1 tablet (0.4 mg total) under the tongue every 5 (five) minutes as needed for chest pain.  25 tablet  12  . pravastatin (PRAVACHOL) 40 MG tablet Take 1 tablet (40 mg total) by mouth every evening.  30 tablet  11  . rivaroxaban (XARELTO) 20 MG TABS tablet Take 1 tablet (20 mg total) by mouth daily with supper.  90 tablet  3   No  current facility-administered medications for this visit.     Past Medical History  Diagnosis Date  . Hypertension   . Coronary atherosclerosis of unspecified type of vessel, native or graft     Non obstructive  . Cerebrovascular disease, unspecified   . Pure hypercholesterolemia   . Diverticulosis of colon (without mention of hemorrhage)   . Benign neoplasm of colon   . Unspecified hemorrhoids without mention of complication   . Degenerative disc disease   . 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 Hemangioma   . Amyloidosis   . LBBB (left bundle branch block)   . 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 OSA (obstructive sleep apnea)   . Atrial fibrillation   . Cancer     amyloidosis  . Edema 08/29/2013  . Hx of cardiovascular stress test     Adenosine Myoview (02/2014): Apical thinning, no ischemia, EF 48%; low risk.    Past Surgical History  Procedure Laterality Date  . Knee arthroscopy    . Carpal tunnel release  08/2006    Dr Doy Mince at [facility] - Bilateral  . 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 Back surgery      Lumbar  . Cardioversion N/A 07/10/2013    Procedure: CARDIOVERSION;  Surgeon: Lelon Perla, MD;  Location: [facility];  Service: Cardiovascular;  Laterality:  N/A;    History   Social History  . Marital Status: Married    Spouse Name: N/A    Number of Children: 2  . Years of Education: N/A   Occupational History  . PRESIDENT     [employer]   Social History Main Topics  . Smoking status: Never Smoker   . Smokeless tobacco: Never Used  . Alcohol Use: Yes  . Drug Use: No  . Sexual Activity: No   Other Topics Concern  . Not on file   Social History Narrative   Lives with wife.      ROS: no fevers or chills, productive cough, hemoptysis, dysphasia, odynophagia, melena, hematochezia, dysuria, hematuria, rash, seizure activity, orthopnea, PND, pedal edema, claudication. Remaining  systems are negative.  Physical Exam: Well-developed well-nourished in no acute distress.  Skin is warm and dry.  HEENT is normal.  Neck is supple.  Chest is clear to auscultation with normal expansion.  Cardiovascular exam is regular rate and rhythm.  Abdominal exam nontender or distended. No masses palpated. Extremities show no edema. neuro grossly intact  ECG     This encounter was created in error - please disregard.

## 2014-10-09 NOTE — Progress Notes (Signed)
While here, offered anti-emetic for home use.  Johnny Mitchell denies need for anything for home.  Abdomen looks good when injection given with faint redness, no swelling noted.

## 2014-10-09 NOTE — Progress Notes (Signed)
1026 Discharged alone, ambulatory in no distress.

## 2014-10-09 NOTE — Patient Instructions (Signed)
[facility] Discharge Instructions for Patients Receiving Chemotherapy  Today you received the following chemotherapy agents: Velcade.  To help prevent nausea and vomiting after your treatment, we encourage you to take your nausea medication as prescribed.   If you develop nausea and vomiting that is not controlled by your nausea medication, call the clinic.   BELOW ARE SYMPTOMS THAT SHOULD BE REPORTED IMMEDIATELY:  *FEVER GREATER THAN 100.5 F  *CHILLS WITH OR WITHOUT FEVER  NAUSEA AND VOMITING THAT IS NOT CONTROLLED WITH YOUR NAUSEA MEDICATION  *UNUSUAL SHORTNESS OF BREATH  *UNUSUAL BRUISING OR BLEEDING  TENDERNESS IN MOUTH AND THROAT WITH OR WITHOUT PRESENCE OF ULCERS  *URINARY PROBLEMS  *BOWEL PROBLEMS  UNUSUAL RASH Items with * indicate a potential emergency and should be followed up as soon as possible.  Feel free to call the clinic you have any questions or concerns. The clinic phone number is [phone].    

## 2014-10-10 ENCOUNTER — Encounter: Payer: Medicare Other | Admitting: Cardiology

## 2014-10-14 ENCOUNTER — Ambulatory Visit: Payer: Medicare Other | Admitting: Cardiology

## 2014-10-15 ENCOUNTER — Encounter: Payer: Self-pay | Admitting: Internal Medicine

## 2014-10-16 ENCOUNTER — Other Ambulatory Visit (HOSPITAL_BASED_OUTPATIENT_CLINIC_OR_DEPARTMENT_OTHER): Payer: Medicare Other

## 2014-10-16 ENCOUNTER — Encounter: Payer: Self-pay | Admitting: Cardiology

## 2014-10-16 ENCOUNTER — Ambulatory Visit (HOSPITAL_BASED_OUTPATIENT_CLINIC_OR_DEPARTMENT_OTHER): Payer: Medicare Other

## 2014-10-16 ENCOUNTER — Ambulatory Visit: Payer: Medicare Other | Admitting: Cardiology

## 2014-10-16 DIAGNOSIS — Z5112 Encounter for antineoplastic immunotherapy: Secondary | ICD-10-CM | POA: Diagnosis not present

## 2014-10-16 DIAGNOSIS — E859 Amyloidosis, unspecified: Secondary | ICD-10-CM

## 2014-10-16 LAB — COMPREHENSIVE METABOLIC PANEL (CC13)
ALK PHOS: 86 U/L (ref 40–150)
ALT: 16 U/L (ref 0–55)
AST: 16 U/L (ref 5–34)
Albumin: 3.2 g/dL — ABNORMAL LOW (ref 3.5–5.0)
Anion Gap: 8 mEq/L (ref 3–11)
BUN: 24.5 mg/dL (ref 7.0–26.0)
CALCIUM: 8.8 mg/dL (ref 8.4–10.4)
CHLORIDE: 108 meq/L (ref 98–109)
CO2: 28 mEq/L (ref 22–29)
Creatinine: 1.3 mg/dL (ref 0.7–1.3)
Glucose: 121 mg/dl (ref 70–140)
POTASSIUM: 4.5 meq/L (ref 3.5–5.1)
Sodium: 143 mEq/L (ref 136–145)
Total Bilirubin: 0.68 mg/dL (ref 0.20–1.20)
Total Protein: 5.8 g/dL — ABNORMAL LOW (ref 6.4–8.3)

## 2014-10-16 LAB — CBC WITH DIFFERENTIAL/PLATELET
BASO%: 0.4 % (ref 0.0–2.0)
Basophils Absolute: 0 10*3/uL (ref 0.0–0.1)
EOS%: 0.1 % (ref 0.0–7.0)
Eosinophils Absolute: 0 10*3/uL (ref 0.0–0.5)
HCT: 41.3 % (ref 38.4–49.9)
HGB: 12.9 g/dL — ABNORMAL LOW (ref 13.0–17.1)
LYMPH#: 0.4 10*3/uL — AB (ref 0.9–3.3)
LYMPH%: 3.9 % — ABNORMAL LOW (ref 14.0–49.0)
MCH: 30.9 pg (ref 27.2–33.4)
MCHC: 31.4 g/dL — AB (ref 32.0–36.0)
MCV: 98.5 fL — ABNORMAL HIGH (ref 79.3–98.0)
MONO#: 0.2 10*3/uL (ref 0.1–0.9)
MONO%: 2.2 % (ref 0.0–14.0)
NEUT#: 10.6 10*3/uL — ABNORMAL HIGH (ref 1.5–6.5)
NEUT%: 93.4 % — ABNORMAL HIGH (ref 39.0–75.0)
Platelets: 153 10*3/uL (ref 140–400)
RBC: 4.19 10*6/uL — ABNORMAL LOW (ref 4.20–5.82)
RDW: 15 % — AB (ref 11.0–14.6)
WBC: 11.3 10*3/uL — ABNORMAL HIGH (ref 4.0–10.3)

## 2014-10-16 MED ORDER — ONDANSETRON HCL 8 MG PO TABS
8.0000 mg | ORAL_TABLET | Freq: Once | ORAL | Status: AC
  Administered 2014-10-16: 8 mg via ORAL

## 2014-10-16 MED ORDER — ONDANSETRON HCL 8 MG PO TABS
ORAL_TABLET | ORAL | Status: AC
  Filled 2014-10-16: qty 1

## 2014-10-16 MED ORDER — BORTEZOMIB CHEMO SQ INJECTION 3.5 MG (2.5MG/ML)
1.3000 mg/m2 | Freq: Once | INTRAMUSCULAR | Status: AC
  Administered 2014-10-16: 3.25 mg via SUBCUTANEOUS
  Filled 2014-10-16: qty 3.25

## 2014-10-16 NOTE — Patient Instructions (Signed)
[facility] Discharge Instructions for Patients Receiving Chemotherapy  Today you received the following chemotherapy agents velcade  To help prevent nausea and vomiting after your treatment, we encourage you to take your nausea medication if needed.   If you develop nausea and vomiting that is not controlled by your nausea medication, call the clinic.   BELOW ARE SYMPTOMS THAT SHOULD BE REPORTED IMMEDIATELY:  *FEVER GREATER THAN 100.5 F  *CHILLS WITH OR WITHOUT FEVER  NAUSEA AND VOMITING THAT IS NOT CONTROLLED WITH YOUR NAUSEA MEDICATION  *UNUSUAL SHORTNESS OF BREATH  *UNUSUAL BRUISING OR BLEEDING  TENDERNESS IN MOUTH AND THROAT WITH OR WITHOUT PRESENCE OF ULCERS  *URINARY PROBLEMS  *BOWEL PROBLEMS  UNUSUAL RASH Items with * indicate a potential emergency and should be followed up as soon as possible.  Feel free to call the clinic you have any questions or concerns. The clinic phone number is [phone].

## 2014-10-23 ENCOUNTER — Ambulatory Visit (HOSPITAL_BASED_OUTPATIENT_CLINIC_OR_DEPARTMENT_OTHER): Payer: Medicare Other | Admitting: Hematology and Oncology

## 2014-10-23 ENCOUNTER — Encounter: Payer: Self-pay | Admitting: Hematology and Oncology

## 2014-10-23 ENCOUNTER — Telehealth: Payer: Self-pay | Admitting: *Deleted

## 2014-10-23 ENCOUNTER — Other Ambulatory Visit (HOSPITAL_BASED_OUTPATIENT_CLINIC_OR_DEPARTMENT_OTHER): Payer: Medicare Other

## 2014-10-23 ENCOUNTER — Telehealth: Payer: Self-pay | Admitting: Hematology and Oncology

## 2014-10-23 ENCOUNTER — Other Ambulatory Visit: Payer: Self-pay | Admitting: Hematology and Oncology

## 2014-10-23 ENCOUNTER — Ambulatory Visit (HOSPITAL_BASED_OUTPATIENT_CLINIC_OR_DEPARTMENT_OTHER): Payer: Medicare Other

## 2014-10-23 VITALS — BP 135/64 | HR 68 | Temp 98.2°F | Resp 18 | Ht 71.0 in | Wt 268.6 lb

## 2014-10-23 DIAGNOSIS — R0609 Other forms of dyspnea: Secondary | ICD-10-CM | POA: Diagnosis not present

## 2014-10-23 DIAGNOSIS — E859 Amyloidosis, unspecified: Secondary | ICD-10-CM

## 2014-10-23 DIAGNOSIS — R609 Edema, unspecified: Secondary | ICD-10-CM | POA: Diagnosis not present

## 2014-10-23 DIAGNOSIS — Z5112 Encounter for antineoplastic immunotherapy: Secondary | ICD-10-CM

## 2014-10-23 DIAGNOSIS — R600 Localized edema: Secondary | ICD-10-CM

## 2014-10-23 DIAGNOSIS — N508 Other specified disorders of male genital organs: Secondary | ICD-10-CM | POA: Diagnosis not present

## 2014-10-23 DIAGNOSIS — N5082 Scrotal pain: Secondary | ICD-10-CM

## 2014-10-23 HISTORY — DX: Scrotal pain: N50.82

## 2014-10-23 LAB — CBC WITH DIFFERENTIAL/PLATELET
BASO%: 0.4 % (ref 0.0–2.0)
BASOS ABS: 0 10*3/uL (ref 0.0–0.1)
EOS%: 0.5 % (ref 0.0–7.0)
Eosinophils Absolute: 0 10*3/uL (ref 0.0–0.5)
HEMATOCRIT: 44 % (ref 38.4–49.9)
HEMOGLOBIN: 13.9 g/dL (ref 13.0–17.1)
LYMPH%: 8.4 % — AB (ref 14.0–49.0)
MCH: 31.4 pg (ref 27.2–33.4)
MCHC: 31.7 g/dL — ABNORMAL LOW (ref 32.0–36.0)
MCV: 99.1 fL — ABNORMAL HIGH (ref 79.3–98.0)
MONO#: 0.3 10*3/uL (ref 0.1–0.9)
MONO%: 3.8 % (ref 0.0–14.0)
NEUT#: 8.1 10*3/uL — ABNORMAL HIGH (ref 1.5–6.5)
NEUT%: 86.9 % — AB (ref 39.0–75.0)
PLATELETS: 158 10*3/uL (ref 140–400)
RBC: 4.44 10*6/uL (ref 4.20–5.82)
RDW: 15.5 % — ABNORMAL HIGH (ref 11.0–14.6)
WBC: 9.3 10*3/uL (ref 4.0–10.3)
lymph#: 0.8 10*3/uL — ABNORMAL LOW (ref 0.9–3.3)

## 2014-10-23 LAB — COMPREHENSIVE METABOLIC PANEL (CC13)
ALK PHOS: 88 U/L (ref 40–150)
ALT: 17 U/L (ref 0–55)
AST: 15 U/L (ref 5–34)
Albumin: 3.3 g/dL — ABNORMAL LOW (ref 3.5–5.0)
Anion Gap: 8 mEq/L (ref 3–11)
BUN: 24.4 mg/dL (ref 7.0–26.0)
CALCIUM: 9.4 mg/dL (ref 8.4–10.4)
CHLORIDE: 107 meq/L (ref 98–109)
CO2: 28 mEq/L (ref 22–29)
CREATININE: 1.3 mg/dL (ref 0.7–1.3)
Glucose: 84 mg/dl (ref 70–140)
Potassium: 4.3 mEq/L (ref 3.5–5.1)
Sodium: 142 mEq/L (ref 136–145)
Total Bilirubin: 0.74 mg/dL (ref 0.20–1.20)
Total Protein: 6.2 g/dL — ABNORMAL LOW (ref 6.4–8.3)

## 2014-10-23 MED ORDER — ONDANSETRON HCL 8 MG PO TABS
ORAL_TABLET | ORAL | Status: AC
  Filled 2014-10-23: qty 1

## 2014-10-23 MED ORDER — ONDANSETRON HCL 8 MG PO TABS
8.0000 mg | ORAL_TABLET | Freq: Three times a day (TID) | ORAL | Status: DC | PRN
Start: 2014-10-23 — End: 2014-11-20

## 2014-10-23 MED ORDER — BORTEZOMIB CHEMO SQ INJECTION 3.5 MG (2.5MG/ML)
1.3000 mg/m2 | Freq: Once | INTRAMUSCULAR | Status: AC
  Administered 2014-10-23: 3.25 mg via SUBCUTANEOUS
  Filled 2014-10-23: qty 3.25

## 2014-10-23 MED ORDER — ONDANSETRON HCL 8 MG PO TABS
8.0000 mg | ORAL_TABLET | Freq: Once | ORAL | Status: AC
  Administered 2014-10-23: 8 mg via ORAL

## 2014-10-23 NOTE — Assessment & Plan Note (Signed)
The cause is unknown. I recommend urologist review.

## 2014-10-23 NOTE — Telephone Encounter (Signed)
Pls call it in, 60 tabs, 3 refills. I will cancel it in his plan

## 2014-10-23 NOTE — Progress Notes (Signed)
[facility] OFFICE PROGRESS NOTE  Patient Care Team: No Pcp Per Patient as PCP - General (General Practice) Lelon Perla, MD (Cardiology) Heath Lark, MD as Consulting Physician (Hematology and Oncology)  SUMMARY OF ONCOLOGIC HISTORY:  He was diagnosed with systemic amyloidosis after presentation with shortness of breath. He has significant evaluation including bone marrow aspirate and biopsy. 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 The patient subsequently went for maintain dense chemotherapy with Velcade for almost 2 years and then switch to Revlimid. He self discontinue Revlimid in May of 2014 due to fatigue. In July 2015, we restarted him back on Velcade due to a rising light chain. In September 2015, the patient requested to add back dexamethasone at 20 mg every week. In November 2015, the patient reduce dexamethasone to 16 mg every week along with Velcade injection INTERVAL HISTORY: Please see below for problem oriented charting. He is seen for toxicity revealed before his chemotherapy. He complained of shortness of breath. He has mild fluid retention. He has easy skin bruising. He denies neuropathy. He complained of scrotal pain recently. REVIEW OF SYSTEMS:   Constitutional: Denies fevers, chills or abnormal weight loss Eyes: Denies blurriness of vision Ears, nose, mouth, throat, and face: Denies mucositis or sore throat Cardiovascular: Denies palpitation, chest discomfort  Gastrointestinal:  Denies nausea, heartburn or change in bowel habits Skin: Denies abnormal skin rashes Lymphatics: Denies new lymphadenopathy Neurological:Denies numbness, tingling or new weaknesses Behavioral/Psych: Mood is stable, no new changes  All other systems were  reviewed with the patient and are negative.  I have reviewed the past medical history, past surgical history, social history and family history with the patient and they are unchanged from previous note.  ALLERGIES:  has No Known Allergies.  MEDICATIONS:  Current Outpatient Prescriptions  Medication Sig Dispense Refill  . acyclovir (ZOVIRAX) 400 MG tablet Take 400 mg by mouth once.    Marland Kitchen amiodarone (PACERONE) 200 MG tablet Take 1 tablet (200 mg total) by mouth daily. 30 tablet 11  . dexamethasone (DECADRON) 4 MG tablet Take 5 tablets every week on Wednesdays 60 tablet 3  . furosemide (LASIX) 40 MG tablet Take 2 tablets (80 mg total) by mouth 2 (two) times daily. 90 tablet 3  . nitroGLYCERIN (NITROSTAT) 0.4 MG SL tablet Place 1 tablet (0.4 mg total) under the tongue every 5 (five) minutes as needed for chest pain. 25 tablet 12  . pravastatin (PRAVACHOL) 40 MG tablet Take 1 tablet (40 mg total) by mouth every evening. 30 tablet 11  . rivaroxaban (XARELTO) 20 MG TABS tablet Take 1 tablet (20 mg total) by mouth daily with supper. 90 tablet 3   No current facility-administered medications for this visit.    PHYSICAL EXAMINATION: ECOG PERFORMANCE STATUS: 1 - Symptomatic but completely ambulatory  Filed Vitals:   10/23/14 0919  BP: 135/64  Pulse: 68  Temp: 98.2 F (36.8 C)  Resp: 18   Filed Weights   10/23/14 0919  Weight: 268 lb 9.6 oz (121.836 kg)    GENERAL:alert, no distress and comfortable. He is morbidly obese SKIN: skin color, texture, turgor are normal, no rashes or significant lesions. Noticed skin bruising EYES: normal, Conjunctiva are pink and non-injected, sclera clear OROPHARYNX:no exudate, no erythema and lips, buccal mucosa, and tongue normal  NECK: supple, thyroid normal  size, non-tender, without nodularity LYMPH:  no palpable lymphadenopathy in the cervical, axillary or inguinal LUNGS: clear to auscultation and percussion with normal breathing effort HEART: regular  rate & rhythm and no murmurs with moderate bilateral lower extremity edema ABDOMEN:abdomen soft, non-tender and normal bowel sounds Musculoskeletal:no cyanosis of digits and no clubbing  NEURO: alert & oriented x 3 with fluent speech, no focal motor/sensory deficits  LABORATORY DATA:  I have reviewed the data as listed    Component Value Date/Time   NA 143 10/16/2014 1027   NA 142 10/02/2014 0851   K 4.5 10/16/2014 1027   K 3.9 10/02/2014 0851   CL 98 10/02/2014 0851   CL 103 05/31/2013 0842   CO2 28 10/16/2014 1027   CO2 33* 10/02/2014 0851   GLUCOSE 121 10/16/2014 1027   GLUCOSE 121* 10/02/2014 0851   GLUCOSE 118* 05/31/2013 0842   BUN 24.5 10/16/2014 1027   BUN 23 10/02/2014 0851   CREATININE 1.3 10/16/2014 1027   CREATININE 1.41* 10/02/2014 0851   CREATININE 1.13 06/10/2014 1221   CREATININE 0.92 12/17/2011 1606   CALCIUM 8.8 10/16/2014 1027   CALCIUM 9.0 10/02/2014 0851   PROT 5.8* 10/16/2014 1027   PROT 6.7 10/02/2014 0851   ALBUMIN 3.2* 10/16/2014 1027   ALBUMIN 3.4* 10/02/2014 0851   AST 16 10/16/2014 1027   AST 17 10/02/2014 0851   ALT 16 10/16/2014 1027   ALT 14 10/02/2014 0851   ALKPHOS 86 10/16/2014 1027   ALKPHOS 93 10/02/2014 0851   BILITOT 0.68 10/16/2014 1027   BILITOT 0.8 10/02/2014 0851   GFRNONAA 63 06/10/2014 1221   GFRNONAA 45* 06/01/2013 0548   GFRAA 73 06/10/2014 1221   GFRAA 52* 06/01/2013 0548    No results found for: SPEP, UPEP  Lab Results  Component Value Date   WBC 9.3 10/23/2014   NEUTROABS 8.1* 10/23/2014   HGB 13.9 10/23/2014   HCT 44.0 10/23/2014   MCV 99.1* 10/23/2014   PLT 158 10/23/2014      Chemistry      Component Value Date/Time   NA 143 10/16/2014 1027   NA 142 10/02/2014 0851   K 4.5 10/16/2014 1027   K 3.9 10/02/2014 0851   CL 98 10/02/2014 0851   CL 103 05/31/2013 0842   CO2 28 10/16/2014 1027   CO2 33* 10/02/2014 0851   BUN 24.5 10/16/2014 1027   BUN 23 10/02/2014 0851   CREATININE 1.3 10/16/2014 1027    CREATININE 1.41* 10/02/2014 0851   CREATININE 1.13 06/10/2014 1221   CREATININE 0.92 12/17/2011 1606      Component Value Date/Time   CALCIUM 8.8 10/16/2014 1027   CALCIUM 9.0 10/02/2014 0851   ALKPHOS 86 10/16/2014 1027   ALKPHOS 93 10/02/2014 0851   AST 16 10/16/2014 1027   AST 17 10/02/2014 0851   ALT 16 10/16/2014 1027   ALT 14 10/02/2014 0851   BILITOT 0.68 10/16/2014 1027   BILITOT 0.8 10/02/2014 0851     ASSESSMENT & PLAN:  Amyloidosis Clinically, he has no signs of end organ damage. The patient is recommended to continue taking acyclovir for antimicrobial prophylaxis while on Velcade. He tolerates treatment well without any side effects so far. I would not recommend him to take dexamethasone due to the risk of worsening fluid retention and leg edema. Dexamethasone probably would contribute very little to the overall treatment of his amyloidosis but the patient insisted and started taking 20 mg weekly, recently self-reduced to 16 mg weekly We will  continue current treatment plan without adjustment    Pedal edema This is likely related to diastolic heart failure and fluid retention. Rather than subject him to more medication, I recommend dietary modification, exercise and weight loss. I told him dexamethasone will likely exacerbate this further but the patient is not willing to listen. I would defer to management of fluid retention to his cardiologist.  Scrotal pain The cause is unknown. I recommend urologist review.  Dyspnea on exertion This is multifactorial, related to morbid obesity, cardiac amyloidosis and mild fluid retention. I would recommend he discuss medical management with his cardiologist.   Orders Placed This Encounter  Procedures  . SPEP & IFE with QIG    Standing Status: Standing     Number of Occurrences: 9     Standing Expiration Date: 10/24/2015  . Kappa/lambda light chains    Standing Status: Standing     Number of Occurrences: 9     Standing  Expiration Date: 10/24/2015  . Beta 2 microglobulin, serum    Standing Status: Standing     Number of Occurrences: 2     Standing Expiration Date: 10/24/2015  . Ambulatory referral to Urology    Referral Priority:  Routine    Referral Type:  Consultation    Referral Reason:  Specialty Services Required    Requested Specialty:  Urology    Number of Visits Requested:  1   All questions were answered. The patient knows to call the clinic with any problems, questions or concerns. No barriers to learning was detected. I spent 30 minutes counseling the patient face to face. The total time spent in the appointment was 40 minutes and more than 50% was on counseling and review of test results     [facility], [place], MD 10/23/2014 9:44 AM

## 2014-10-23 NOTE — Telephone Encounter (Signed)
Pt requests rx for Zofran sent to [facility].  He would rather take it at home prior to coming in for Velcade.  than have it given to him in Infusion room.  He says he thinks it costs less if he can get it from [facility] rather than taking it here.

## 2014-10-23 NOTE — Patient Instructions (Signed)
Bortezomib injection What is this medicine? BORTEZOMIB (bor TEZ oh mib) is a chemotherapy drug. It slows the growth of cancer cells. This medicine is used to treat multiple myeloma, and certain lymphomas, such as mantle-cell lymphoma. This medicine may be used for other purposes; ask your health care provider or pharmacist if you have questions. COMMON BRAND NAME(S): Velcade What should I tell my health care provider before I take this medicine? They need to know if you have any of these conditions: -diabetes -heart disease -irregular heartbeat -liver disease -on hemodialysis -low blood counts, like low white blood cells, platelets, or hemoglobin -peripheral neuropathy -taking medicine for blood pressure -an unusual or allergic reaction to bortezomib, mannitol, boron, other medicines, foods, dyes, or preservatives -pregnant or trying to get pregnant -breast-feeding How should I use this medicine? This medicine is for injection into a vein or for injection under the skin. It is given by a health care professional in a hospital or clinic setting. Talk to your pediatrician regarding the use of this medicine in children. Special care may be needed. Overdosage: If you think you have taken too much of this medicine contact a poison control center or emergency room at once. NOTE: This medicine is only for you. Do not share this medicine with others. What if I miss a dose? It is important not to miss your dose. Call your doctor or health care professional if you are unable to keep an appointment. What may interact with this medicine? This medicine may interact with the following medications: -ketoconazole -rifampin -ritonavir -St. John's Wort This list may not describe all possible interactions. Give your health care provider a list of all the medicines, herbs, non-prescription drugs, or dietary supplements you use. Also tell them if you smoke, drink alcohol, or use illegal drugs. Some items  may interact with your medicine. What should I watch for while using this medicine? Visit your doctor for checks on your progress. This drug may make you feel generally unwell. This is not uncommon, as chemotherapy can affect healthy cells as well as cancer cells. Report any side effects. Continue your course of treatment even though you feel ill unless your doctor tells you to stop. You may get drowsy or dizzy. Do not drive, use machinery, or do anything that needs mental alertness until you know how this medicine affects you. Do not stand or sit up quickly, especially if you are an older patient. This reduces the risk of dizzy or fainting spells. In some cases, you may be given additional medicines to help with side effects. Follow all directions for their use. Call your doctor or health care professional for advice if you get a fever, chills or sore throat, or other symptoms of a cold or flu. Do not treat yourself. This drug decreases your body's ability to fight infections. Try to avoid being around people who are sick. This medicine may increase your risk to bruise or bleed. Call your doctor or health care professional if you notice any unusual bleeding. You may need blood work done while you are taking this medicine. In some patients, this medicine may cause a serious brain infection that may cause death. If you have any problems seeing, thinking, speaking, walking, or standing, tell your doctor right away. If you cannot reach your doctor, urgently seek other source of medical care. Do not become pregnant while taking this medicine. Women should inform their doctor if they wish to become pregnant or think they might be pregnant. There is   a potential for serious side effects to an unborn child. Talk to your health care professional or pharmacist for more information. Do not breast-feed an infant while taking this medicine. Check with your doctor or health care professional if you get an attack of  severe diarrhea, nausea and vomiting, or if you sweat a lot. The loss of too much body fluid can make it dangerous for you to take this medicine. What side effects may I notice from receiving this medicine? Side effects that you should report to your doctor or health care professional as soon as possible: -allergic reactions like skin rash, itching or hives, swelling of the face, lips, or tongue -breathing problems -changes in hearing -changes in vision -fast, irregular heartbeat -feeling faint or lightheaded, falls -pain, tingling, numbness in the hands or feet -right upper belly pain -seizures -swelling of the ankles, feet, hands -unusual bleeding or bruising -unusually weak or tired -vomiting -yellowing of the eyes or skin Side effects that usually do not require medical attention (report to your doctor or health care professional if they continue or are bothersome): -changes in emotions or moods -constipation -diarrhea -loss of appetite -headache -irritation at site where injected -nausea This list may not describe all possible side effects. Call your doctor for medical advice about side effects. You may report side effects to FDA at 1-800-FDA-1088. Where should I keep my medicine? This drug is given in a hospital or clinic and will not be stored at home. NOTE: This sheet is a summary. It may not cover all possible information. If you have questions about this medicine, talk to your doctor, pharmacist, or health care provider.  2015, [employer] Standard. (2013-09-24 12:46:32)  

## 2014-10-23 NOTE — Assessment & Plan Note (Signed)
This is multifactorial, related to morbid obesity, cardiac amyloidosis and mild fluid retention. I would recommend he discuss medical management with his cardiologist.

## 2014-10-23 NOTE — Telephone Encounter (Signed)
Pt confirmed labs/ov per 11/11 POF, S/w staff scheduler pt scheduled to see Dr. Alexis Frock on 12/08 @8 :30, sending request to fax notes to [phone], spk w/pt confirming referral apt..... KJ

## 2014-10-23 NOTE — Assessment & Plan Note (Signed)
This is likely related to diastolic heart failure and fluid retention. Rather than subject him to more medication, I recommend dietary modification, exercise and weight loss. I told him dexamethasone will likely exacerbate this further but the patient is not willing to listen. I would defer to management of fluid retention to his cardiologist.

## 2014-10-23 NOTE — Assessment & Plan Note (Signed)
Clinically, he has no signs of end organ damage. The patient is recommended to continue taking acyclovir for antimicrobial prophylaxis while on Velcade. He tolerates treatment well without any side effects so far. I would not recommend him to take dexamethasone due to the risk of worsening fluid retention and leg edema. Dexamethasone probably would contribute very little to the overall treatment of his amyloidosis but the patient insisted and started taking 20 mg weekly, recently self-reduced to 16 mg weekly We will continue current treatment plan without adjustment

## 2014-10-24 ENCOUNTER — Telehealth: Payer: Self-pay | Admitting: *Deleted

## 2014-10-24 ENCOUNTER — Telehealth: Payer: Self-pay | Admitting: Hematology and Oncology

## 2014-10-24 NOTE — Telephone Encounter (Signed)
Per staff message and POF I have scheduled appts. Advised scheduler of appts. JMW  

## 2014-10-24 NOTE — Telephone Encounter (Signed)
Faxed pt medical records to Dr. Tresa Moore

## 2014-10-25 LAB — SPEP & IFE WITH QIG
ALBUMIN ELP: 57.2 % (ref 55.8–66.1)
Alpha-1-Globulin: 4.7 % (ref 2.9–4.9)
Alpha-2-Globulin: 16.2 % — ABNORMAL HIGH (ref 7.1–11.8)
Beta 2: 5.6 % (ref 3.2–6.5)
Beta Globulin: 6 % (ref 4.7–7.2)
Gamma Globulin: 10.3 % — ABNORMAL LOW (ref 11.1–18.8)
IGA: 138 mg/dL (ref 68–379)
IGM, SERUM: 28 mg/dL — AB (ref 41–251)
IgG (Immunoglobin G), Serum: 680 mg/dL (ref 650–1600)
Total Protein, Serum Electrophoresis: 6.1 g/dL (ref 6.0–8.3)

## 2014-10-25 LAB — KAPPA/LAMBDA LIGHT CHAINS
KAPPA LAMBDA RATIO: 2.48 — AB (ref 0.26–1.65)
Kappa free light chain: 2.33 mg/dL — ABNORMAL HIGH (ref 0.33–1.94)
LAMBDA FREE LGHT CHN: 0.94 mg/dL (ref 0.57–2.63)

## 2014-10-25 LAB — BETA 2 MICROGLOBULIN, SERUM: Beta-2 Microglobulin: 3.41 mg/L — ABNORMAL HIGH (ref <=2.51)

## 2014-10-28 ENCOUNTER — Encounter: Payer: Self-pay | Admitting: Hematology and Oncology

## 2014-10-28 NOTE — Progress Notes (Signed)
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 LHC 10/2009: pLAD 20%, dLAD 30%, mOM2 70-80% (unchanged compared to previous), pRCA 20%, dRCA 20%. Cardiac MRI in January of 2011 showed an ejection fraction of 57%, mild biatrial enlargement, mild right ventricular enlargement. Cardiac amyloid could not be definitively excluded.  Admitted in February of 2012 with a wide complex tachycardia. Seen by EP and rhythm was most likely felt to be SVT with aberrancy. It was felt that if symptoms recur ablation would be warranted.  Echo 10/2012: Abnormal septal motion from ?bundle branch block, moderate LVH, EF 55-60%, mild MR, moderate LAE mild RAE, PASP 53.  Cardiac MRI (09/05/13): Moderate LVE, focal septal hypertrophy, EF 51%, global HK, mild RVE, normal RVSF, no evidence of cardiac amyloid. He has had some increase in his light chains on electrophoresis. This is being followed. Nuclear study in March of 2015 showed apical thinning but no ischemia. Ejection fraction was 48%. Diuretics increased at last OV for volume excess. Since then, He does have dyspnea on exertion and pedal edema. He has not taken higher doses of Lasix as prescribed previously. No chest pain or syncope.  Current Outpatient Prescriptions  Medication Sig Dispense Refill  . acyclovir (ZOVIRAX) 400 MG tablet Take 400 mg by mouth once.    Marland Kitchen amiodarone (PACERONE) 200 MG tablet Take 1 tablet (200 mg total) by mouth daily. 30 tablet 11  . dexamethasone (DECADRON) 4 MG tablet Take 5 tablets every week on Wednesdays 60 tablet 3  . furosemide (LASIX) 40 MG tablet Take 2 tablets (80 mg total) by mouth 2 (two) times daily. 90 tablet 3  . nitroGLYCERIN (NITROSTAT) 0.4 MG SL tablet Place 1 tablet (0.4 mg total) under the tongue every 5 (five) minutes as needed for chest pain. 25 tablet 12  . ondansetron (ZOFRAN) 8 MG tablet Take 1 tablet (8 mg total) by mouth every 8 (eight) hours as needed  for nausea or vomiting. 60 tablet 3  . pravastatin (PRAVACHOL) 40 MG tablet Take 1 tablet (40 mg total) by mouth every evening. 30 tablet 11  . rivaroxaban (XARELTO) 20 MG TABS tablet Take 1 tablet (20 mg total) by mouth daily with supper. 90 tablet 3   No current facility-administered medications for this visit.     Past Medical History  Diagnosis Date  . Hypertension   . Coronary atherosclerosis of unspecified type of vessel, native or graft     Non obstructive  . Cerebrovascular disease, unspecified   . Pure hypercholesterolemia   . Diverticulosis of colon (without mention of hemorrhage)   . Benign neoplasm of colon   . Unspecified hemorrhoids without mention of complication   . Degenerative disc disease   . 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 Hemangioma   . Amyloidosis   . LBBB (left bundle branch block)   . 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 OSA (obstructive sleep apnea)   . Atrial fibrillation   . Cancer     amyloidosis  . Edema 08/29/2013  . Hx of cardiovascular stress test     Adenosine Myoview (02/2014): Apical thinning, no ischemia, EF 48%; low risk.  . Scrotal pain 10/23/2014    Past Surgical History  Procedure Laterality Date  . Knee arthroscopy    . Carpal tunnel release  08/2006    Dr Doy Mince at [facility] - Bilateral  . 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 Back surgery      Lumbar  . Cardioversion N/A 07/10/2013    Procedure: CARDIOVERSION;  Surgeon: Lelon Perla, MD;  Location: [facility] ENDOSCOPY;  Service: Cardiovascular;  Laterality: N/A;    History   Social History  . Marital Status: Married    Spouse Name: N/A    Number of Children: 2  . Years of Education: N/A   Occupational History  . PRESIDENT     [employer]   Social History Main Topics  . Smoking status: Never Smoker   . Smokeless tobacco: Never Used  . Alcohol Use: Yes  . Drug Use: No  . Sexual Activity: No    Other Topics Concern  . Not on file   Social History Narrative   Lives with wife.      ROS: fatigue but no fevers or chills, productive cough, hemoptysis, dysphasia, odynophagia, melena, hematochezia, dysuria, hematuria, rash, seizure activity, orthopnea, PND, claudication. Remaining systems are negative.  Physical Exam: Well-developed obese in no acute distress.  Skin is warm and dry.  HEENT is normal.  Neck is supple.  Chest is clear to auscultation with normal expansion.  Cardiovascular exam is regular rate and rhythm.  Abdominal exam nontender or distended. No masses palpated. Extremities show 2+ edema. neuro grossly intact  ECG Sinus rhythm with left bundle branch block. First-degree AV block.

## 2014-10-30 ENCOUNTER — Ambulatory Visit (HOSPITAL_BASED_OUTPATIENT_CLINIC_OR_DEPARTMENT_OTHER): Payer: Medicare Other

## 2014-10-30 ENCOUNTER — Other Ambulatory Visit (HOSPITAL_BASED_OUTPATIENT_CLINIC_OR_DEPARTMENT_OTHER): Payer: Medicare Other

## 2014-10-30 DIAGNOSIS — Z5112 Encounter for antineoplastic immunotherapy: Secondary | ICD-10-CM

## 2014-10-30 DIAGNOSIS — Z859 Personal history of malignant neoplasm, unspecified: Secondary | ICD-10-CM | POA: Diagnosis not present

## 2014-10-30 DIAGNOSIS — E859 Amyloidosis, unspecified: Secondary | ICD-10-CM

## 2014-10-30 LAB — CBC WITH DIFFERENTIAL/PLATELET
BASO%: 0.1 % (ref 0.0–2.0)
Basophils Absolute: 0 10*3/uL (ref 0.0–0.1)
EOS%: 1.2 % (ref 0.0–7.0)
Eosinophils Absolute: 0.1 10*3/uL (ref 0.0–0.5)
HCT: 41.4 % (ref 38.4–49.9)
HGB: 13.4 g/dL (ref 13.0–17.1)
LYMPH%: 8.9 % — ABNORMAL LOW (ref 14.0–49.0)
MCH: 32.2 pg (ref 27.2–33.4)
MCHC: 32.4 g/dL (ref 32.0–36.0)
MCV: 99.5 fL — AB (ref 79.3–98.0)
MONO#: 1 10*3/uL — ABNORMAL HIGH (ref 0.1–0.9)
MONO%: 10.6 % (ref 0.0–14.0)
NEUT#: 7.5 10*3/uL — ABNORMAL HIGH (ref 1.5–6.5)
NEUT%: 79.2 % — ABNORMAL HIGH (ref 39.0–75.0)
Platelets: 158 10*3/uL (ref 140–400)
RBC: 4.16 10*6/uL — AB (ref 4.20–5.82)
RDW: 14.8 % — AB (ref 11.0–14.6)
WBC: 9.5 10*3/uL (ref 4.0–10.3)
lymph#: 0.9 10*3/uL (ref 0.9–3.3)

## 2014-10-30 LAB — COMPREHENSIVE METABOLIC PANEL (CC13)
ALBUMIN: 3 g/dL — AB (ref 3.5–5.0)
ALK PHOS: 112 U/L (ref 40–150)
ALT: 14 U/L (ref 0–55)
AST: 14 U/L (ref 5–34)
Anion Gap: 10 mEq/L (ref 3–11)
BUN: 23.8 mg/dL (ref 7.0–26.0)
CO2: 29 mEq/L (ref 22–29)
Calcium: 8.8 mg/dL (ref 8.4–10.4)
Chloride: 105 mEq/L (ref 98–109)
Creatinine: 1.3 mg/dL (ref 0.7–1.3)
Glucose: 108 mg/dl (ref 70–140)
POTASSIUM: 4.1 meq/L (ref 3.5–5.1)
SODIUM: 144 meq/L (ref 136–145)
TOTAL PROTEIN: 5.7 g/dL — AB (ref 6.4–8.3)
Total Bilirubin: 0.56 mg/dL (ref 0.20–1.20)

## 2014-10-30 MED ORDER — BORTEZOMIB CHEMO SQ INJECTION 3.5 MG (2.5MG/ML)
1.3000 mg/m2 | Freq: Once | INTRAMUSCULAR | Status: AC
  Administered 2014-10-30: 3.25 mg via SUBCUTANEOUS
  Filled 2014-10-30: qty 3.25

## 2014-10-30 NOTE — Patient Instructions (Signed)
[facility] Discharge Instructions for Patients Receiving Chemotherapy  Today you received the following chemotherapy agents: Velcade.  To help prevent nausea and vomiting after your treatment, we encourage you to take your nausea medication as prescribed.   If you develop nausea and vomiting that is not controlled by your nausea medication, call the clinic.   BELOW ARE SYMPTOMS THAT SHOULD BE REPORTED IMMEDIATELY:  *FEVER GREATER THAN 100.5 F  *CHILLS WITH OR WITHOUT FEVER  NAUSEA AND VOMITING THAT IS NOT CONTROLLED WITH YOUR NAUSEA MEDICATION  *UNUSUAL SHORTNESS OF BREATH  *UNUSUAL BRUISING OR BLEEDING  TENDERNESS IN MOUTH AND THROAT WITH OR WITHOUT PRESENCE OF ULCERS  *URINARY PROBLEMS  *BOWEL PROBLEMS  UNUSUAL RASH Items with * indicate a potential emergency and should be followed up as soon as possible.  Feel free to call the clinic you have any questions or concerns. The clinic phone number is [phone].    

## 2014-10-31 ENCOUNTER — Encounter: Payer: Self-pay | Admitting: *Deleted

## 2014-10-31 ENCOUNTER — Ambulatory Visit (INDEPENDENT_AMBULATORY_CARE_PROVIDER_SITE_OTHER): Payer: Medicare Other | Admitting: Cardiology

## 2014-10-31 ENCOUNTER — Encounter: Payer: Self-pay | Admitting: Cardiology

## 2014-10-31 VITALS — BP 130/80 | HR 81 | Ht 61.0 in | Wt 275.0 lb

## 2014-10-31 DIAGNOSIS — I4891 Unspecified atrial fibrillation: Secondary | ICD-10-CM

## 2014-10-31 DIAGNOSIS — E859 Amyloidosis, unspecified: Secondary | ICD-10-CM | POA: Diagnosis not present

## 2014-10-31 DIAGNOSIS — I2510 Atherosclerotic heart disease of native coronary artery without angina pectoris: Secondary | ICD-10-CM

## 2014-10-31 DIAGNOSIS — I471 Supraventricular tachycardia, unspecified: Secondary | ICD-10-CM

## 2014-10-31 DIAGNOSIS — I480 Paroxysmal atrial fibrillation: Secondary | ICD-10-CM

## 2014-10-31 DIAGNOSIS — R0602 Shortness of breath: Secondary | ICD-10-CM

## 2014-10-31 DIAGNOSIS — E7800 Pure hypercholesterolemia, unspecified: Secondary | ICD-10-CM

## 2014-10-31 DIAGNOSIS — I10 Essential (primary) hypertension: Secondary | ICD-10-CM

## 2014-10-31 DIAGNOSIS — I5033 Acute on chronic diastolic (congestive) heart failure: Secondary | ICD-10-CM

## 2014-10-31 DIAGNOSIS — I5032 Chronic diastolic (congestive) heart failure: Secondary | ICD-10-CM

## 2014-10-31 MED ORDER — FUROSEMIDE 20 MG PO TABS: 40.0000 mg | ORAL_TABLET | Freq: Every day | ORAL | Status: DC

## 2014-10-31 NOTE — Assessment & Plan Note (Signed)
Management per hematology oncology.

## 2014-10-31 NOTE — Assessment & Plan Note (Signed)
Patient is volume overloaded on examination. He did not increase his Lasix as prescribed previously. I have asked him to take 60 mg daily for 3 days and then 40 mg daily. Check potassium and renal function next week. I have instructed him on the importance of low sodium diet and fluid restriction.

## 2014-10-31 NOTE — Assessment & Plan Note (Signed)
Continue amiodarone. 

## 2014-10-31 NOTE — Patient Instructions (Signed)
Your physician recommends that you schedule a follow-up appointment in: [place] 60 MG OF FUROSEMIDE X 3 DAYS THEN DECREASE TO 63 MG ONCE DAILY  Your physician recommends that you return for lab work NEXT Wednesday  Your physician has requested that you have an echocardiogram. Echocardiography is a painless test that uses sound waves to create images of your heart. It provides your doctor with information about the size and shape of your heart and how well your heart's chambers and valves are working. This procedure takes approximately one hour. There are no restrictions for this procedure.

## 2014-10-31 NOTE — Assessment & Plan Note (Signed)
Plan to continue statin. Not on aspirin given need for anticoagulation.

## 2014-10-31 NOTE — Assessment & Plan Note (Signed)
Continue statin. 

## 2014-10-31 NOTE — Assessment & Plan Note (Signed)
Blood pressure controlled. Continue present medications. 

## 2014-10-31 NOTE — Assessment & Plan Note (Signed)
Patient remains in sinus rhythm. Continue amiodarone and xarelto. Check TSH; recent LFTs normal.

## 2014-11-05 ENCOUNTER — Ambulatory Visit (HOSPITAL_COMMUNITY): Payer: Medicare Other

## 2014-11-06 ENCOUNTER — Other Ambulatory Visit (HOSPITAL_BASED_OUTPATIENT_CLINIC_OR_DEPARTMENT_OTHER): Payer: Medicare Other

## 2014-11-06 ENCOUNTER — Ambulatory Visit (HOSPITAL_BASED_OUTPATIENT_CLINIC_OR_DEPARTMENT_OTHER): Payer: Medicare Other

## 2014-11-06 ENCOUNTER — Ambulatory Visit (HOSPITAL_COMMUNITY)
Admission: RE | Admit: 2014-11-06 | Discharge: 2014-11-06 | Disposition: A | Discharge status: Home / Self Care | Payer: Medicare Other | Source: Ambulatory Visit | Attending: Internal Medicine | Admitting: Internal Medicine

## 2014-11-06 DIAGNOSIS — R0600 Dyspnea, unspecified: Secondary | ICD-10-CM | POA: Diagnosis present

## 2014-11-06 DIAGNOSIS — I10 Essential (primary) hypertension: Secondary | ICD-10-CM | POA: Diagnosis not present

## 2014-11-06 DIAGNOSIS — E785 Hyperlipidemia, unspecified: Secondary | ICD-10-CM | POA: Diagnosis not present

## 2014-11-06 DIAGNOSIS — E859 Amyloidosis, unspecified: Secondary | ICD-10-CM | POA: Diagnosis not present

## 2014-11-06 DIAGNOSIS — R0602 Shortness of breath: Secondary | ICD-10-CM

## 2014-11-06 DIAGNOSIS — Z5112 Encounter for antineoplastic immunotherapy: Secondary | ICD-10-CM

## 2014-11-06 DIAGNOSIS — I369 Nonrheumatic tricuspid valve disorder, unspecified: Secondary | ICD-10-CM | POA: Diagnosis not present

## 2014-11-06 DIAGNOSIS — I5032 Chronic diastolic (congestive) heart failure: Secondary | ICD-10-CM

## 2014-11-06 LAB — CBC WITH DIFFERENTIAL/PLATELET
BASO%: 0.4 % (ref 0.0–2.0)
Basophils Absolute: 0 10*3/uL (ref 0.0–0.1)
EOS%: 0.6 % (ref 0.0–7.0)
Eosinophils Absolute: 0.1 10*3/uL (ref 0.0–0.5)
HCT: 41 % (ref 38.4–49.9)
HGB: 13 g/dL (ref 13.0–17.1)
LYMPH#: 0.9 10*3/uL (ref 0.9–3.3)
LYMPH%: 8.7 % — ABNORMAL LOW (ref 14.0–49.0)
MCH: 31.5 pg (ref 27.2–33.4)
MCHC: 31.8 g/dL — AB (ref 32.0–36.0)
MCV: 99.2 fL — ABNORMAL HIGH (ref 79.3–98.0)
MONO#: 0.8 10*3/uL (ref 0.1–0.9)
MONO%: 8 % (ref 0.0–14.0)
NEUT#: 8.5 10*3/uL — ABNORMAL HIGH (ref 1.5–6.5)
NEUT%: 82.3 % — ABNORMAL HIGH (ref 39.0–75.0)
Platelets: 167 10*3/uL (ref 140–400)
RBC: 4.13 10*6/uL — AB (ref 4.20–5.82)
RDW: 15.5 % — ABNORMAL HIGH (ref 11.0–14.6)
WBC: 10.3 10*3/uL (ref 4.0–10.3)

## 2014-11-06 LAB — COMPREHENSIVE METABOLIC PANEL (CC13)
ALT: 15 U/L (ref 0–55)
ANION GAP: 10 meq/L (ref 3–11)
AST: 15 U/L (ref 5–34)
Albumin: 3 g/dL — ABNORMAL LOW (ref 3.5–5.0)
Alkaline Phosphatase: 100 U/L (ref 40–150)
BUN: 27.8 mg/dL — ABNORMAL HIGH (ref 7.0–26.0)
CHLORIDE: 104 meq/L (ref 98–109)
CO2: 29 meq/L (ref 22–29)
Calcium: 8.9 mg/dL (ref 8.4–10.4)
Creatinine: 1.4 mg/dL — ABNORMAL HIGH (ref 0.7–1.3)
Glucose: 122 mg/dl (ref 70–140)
Potassium: 4.1 mEq/L (ref 3.5–5.1)
SODIUM: 143 meq/L (ref 136–145)
TOTAL PROTEIN: 5.6 g/dL — AB (ref 6.4–8.3)
Total Bilirubin: 0.78 mg/dL (ref 0.20–1.20)

## 2014-11-06 MED ORDER — BORTEZOMIB CHEMO SQ INJECTION 3.5 MG (2.5MG/ML)
1.3000 mg/m2 | Freq: Once | INTRAMUSCULAR | Status: AC
  Administered 2014-11-06: 3.25 mg via SUBCUTANEOUS
  Filled 2014-11-06: qty 3.25

## 2014-11-06 NOTE — Patient Instructions (Signed)
[place] Discharge Instructions for Patients Receiving Chemotherapy  Today you received the following chemotherapy agents: Velcade  To help prevent nausea and vomiting after your treatment, we encourage you to take your nausea medication as prescribed by your physician.   If you develop nausea and vomiting that is not controlled by your nausea medication, call the clinic.   BELOW ARE SYMPTOMS THAT SHOULD BE REPORTED IMMEDIATELY:  *FEVER GREATER THAN 100.5 F  *CHILLS WITH OR WITHOUT FEVER  NAUSEA AND VOMITING THAT IS NOT CONTROLLED WITH YOUR NAUSEA MEDICATION  *UNUSUAL SHORTNESS OF BREATH  *UNUSUAL BRUISING OR BLEEDING  TENDERNESS IN MOUTH AND THROAT WITH OR WITHOUT PRESENCE OF ULCERS  *URINARY PROBLEMS  *BOWEL PROBLEMS  UNUSUAL RASH Items with * indicate a potential emergency and should be followed up as soon as possible.  Feel free to call the clinic you have any questions or concerns. The clinic phone number is [phone].

## 2014-11-06 NOTE — Progress Notes (Signed)
2D Echocardiogram Complete.  11/06/2014   Raffaela Ladley Platte, RDCS

## 2014-11-13 ENCOUNTER — Other Ambulatory Visit (HOSPITAL_BASED_OUTPATIENT_CLINIC_OR_DEPARTMENT_OTHER): Payer: Medicare Other

## 2014-11-13 ENCOUNTER — Ambulatory Visit (HOSPITAL_BASED_OUTPATIENT_CLINIC_OR_DEPARTMENT_OTHER): Payer: Medicare Other

## 2014-11-13 DIAGNOSIS — E859 Amyloidosis, unspecified: Secondary | ICD-10-CM | POA: Diagnosis not present

## 2014-11-13 DIAGNOSIS — Z5112 Encounter for antineoplastic immunotherapy: Secondary | ICD-10-CM | POA: Diagnosis not present

## 2014-11-13 LAB — CBC WITH DIFFERENTIAL/PLATELET
BASO%: 0.2 % (ref 0.0–2.0)
BASOS ABS: 0 10*3/uL (ref 0.0–0.1)
EOS%: 0.7 % (ref 0.0–7.0)
Eosinophils Absolute: 0.1 10*3/uL (ref 0.0–0.5)
HEMATOCRIT: 38.9 % (ref 38.4–49.9)
HGB: 12.6 g/dL — ABNORMAL LOW (ref 13.0–17.1)
LYMPH#: 0.7 10*3/uL — AB (ref 0.9–3.3)
LYMPH%: 7.6 % — AB (ref 14.0–49.0)
MCH: 32 pg (ref 27.2–33.4)
MCHC: 32.4 g/dL (ref 32.0–36.0)
MCV: 99 fL — AB (ref 79.3–98.0)
MONO#: 0.8 10*3/uL (ref 0.1–0.9)
MONO%: 8.9 % (ref 0.0–14.0)
NEUT#: 7.7 10*3/uL — ABNORMAL HIGH (ref 1.5–6.5)
NEUT%: 82.6 % — ABNORMAL HIGH (ref 39.0–75.0)
Platelets: 151 10*3/uL (ref 140–400)
RBC: 3.93 10*6/uL — ABNORMAL LOW (ref 4.20–5.82)
RDW: 15.6 % — ABNORMAL HIGH (ref 11.0–14.6)
WBC: 9.4 10*3/uL (ref 4.0–10.3)

## 2014-11-13 LAB — COMPREHENSIVE METABOLIC PANEL (CC13)
ALT: 16 U/L (ref 0–55)
AST: 14 U/L (ref 5–34)
Albumin: 3 g/dL — ABNORMAL LOW (ref 3.5–5.0)
Alkaline Phosphatase: 89 U/L (ref 40–150)
Anion Gap: 8 mEq/L (ref 3–11)
BUN: 26.4 mg/dL — ABNORMAL HIGH (ref 7.0–26.0)
CHLORIDE: 106 meq/L (ref 98–109)
CO2: 28 mEq/L (ref 22–29)
CREATININE: 1.3 mg/dL (ref 0.7–1.3)
Calcium: 8.7 mg/dL (ref 8.4–10.4)
Glucose: 101 mg/dl (ref 70–140)
Potassium: 4.1 mEq/L (ref 3.5–5.1)
Sodium: 142 mEq/L (ref 136–145)
Total Bilirubin: 0.76 mg/dL (ref 0.20–1.20)
Total Protein: 5.6 g/dL — ABNORMAL LOW (ref 6.4–8.3)

## 2014-11-13 LAB — TSH: TSH: 1.517 u[IU]/mL (ref 0.350–4.500)

## 2014-11-13 MED ORDER — BORTEZOMIB CHEMO SQ INJECTION 3.5 MG (2.5MG/ML)
1.3000 mg/m2 | Freq: Once | INTRAMUSCULAR | Status: AC
  Administered 2014-11-13: 3.25 mg via SUBCUTANEOUS
  Filled 2014-11-13: qty 3.25

## 2014-11-13 NOTE — Patient Instructions (Signed)
[place] Discharge Instructions for Patients Receiving Chemotherapy  Today you received the following chemotherapy agents VELCADE  To help prevent nausea and vomiting after your treatment, we encourage you to take your nausea medication if needed.   If you develop nausea and vomiting that is not controlled by your nausea medication, call the clinic.   BELOW ARE SYMPTOMS THAT SHOULD BE REPORTED IMMEDIATELY:  *FEVER GREATER THAN 100.5 F  *CHILLS WITH OR WITHOUT FEVER  NAUSEA AND VOMITING THAT IS NOT CONTROLLED WITH YOUR NAUSEA MEDICATION  *UNUSUAL SHORTNESS OF BREATH  *UNUSUAL BRUISING OR BLEEDING  TENDERNESS IN MOUTH AND THROAT WITH OR WITHOUT PRESENCE OF ULCERS  *URINARY PROBLEMS  *BOWEL PROBLEMS  UNUSUAL RASH Items with * indicate a potential emergency and should be followed up as soon as possible.  Feel free to call the clinic you have any questions or concerns. The clinic phone number is [phone].

## 2014-11-19 NOTE — Progress Notes (Signed)
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 LHC 10/2009: pLAD 20%, dLAD 30%, mOM2 70-80% (unchanged compared to previous), pRCA 20%, dRCA 20%.  Admitted in February of 2012 with a wide complex tachycardia. Seen by EP and rhythm was most likely felt to be SVT with aberrancy. It was felt that if symptoms recur ablation would be warranted.  Cardiac MRI (09/05/13): Moderate LVE, focal septal hypertrophy, EF 51%, global HK, mild RVE, normal RVSF, no evidence of cardiac amyloid. He has had some increase in his light chains on electrophoresis. This is being followed. Nuclear study in March of 2015 showed apical thinning but no ischemia. Ejection fraction was 48%. Echo 11/15 showed EF 62-37, grade 2 diastolic dysfunction, mild LAE and mildly dilated aortic root. Diuretics increased at last OV for volume excess. Since then, He denies dyspnea, chest pain, palpitations or syncope. His pedal edema has improved.  Current Outpatient Prescriptions  Medication Sig Dispense Refill  . amiodarone (PACERONE) 200 MG tablet Take 1 tablet (200 mg total) by mouth daily. 30 tablet 11  . dexamethasone (DECADRON) 4 MG tablet Take 5 tablets every week on Wednesdays 60 tablet 3  . furosemide (LASIX) 20 MG tablet Take 2 tablets (40 mg total) by mouth daily.    . nitroGLYCERIN (NITROSTAT) 0.4 MG SL tablet Place 1 tablet (0.4 mg total) under the tongue every 5 (five) minutes as needed for chest pain. 25 tablet 12  . pravastatin (PRAVACHOL) 40 MG tablet Take 1 tablet (40 mg total) by mouth every evening. 30 tablet 11  . rivaroxaban (XARELTO) 20 MG TABS tablet Take 1 tablet (20 mg total) by mouth daily with supper. 90 tablet 3   No current facility-administered medications for this visit.     Past Medical History  Diagnosis Date  . Hypertension   . Coronary atherosclerosis of unspecified type of vessel, native or graft     Non obstructive  .  Cerebrovascular disease, unspecified   . Pure hypercholesterolemia   . Diverticulosis of colon (without mention of hemorrhage)   . Benign neoplasm of colon   . Unspecified hemorrhoids without mention of complication   . Degenerative disc disease   . 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 Hemangioma   . Amyloidosis   . LBBB (left bundle branch block)   . 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 OSA (obstructive sleep apnea)   . Atrial fibrillation   . Cancer     amyloidosis  . Edema 08/29/2013  . Hx of cardiovascular stress test     Adenosine Myoview (02/2014): Apical thinning, no ischemia, EF 48%; low risk.  . Scrotal pain 10/23/2014    Past Surgical History  Procedure Laterality Date  . Knee arthroscopy    . Carpal tunnel release  08/2006    Dr Doy Mince at [facility] - Bilateral  . 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 Back surgery      Lumbar  . Cardioversion N/A 07/10/2013    Procedure: CARDIOVERSION;  Surgeon: Lelon Perla, MD;  Location: [facility] ENDOSCOPY;  Service: Cardiovascular;  Laterality: N/A;    History   Social History  . Marital Status: Married    Spouse Name: N/A    Number of Children: 2  . Years of Education: N/A   Occupational History  . [employer]  History Main Topics  . Smoking status: Never Smoker   . Smokeless tobacco: Never Used  . Alcohol Use: Yes  . Drug Use: No  . Sexual Activity: No   Other Topics Concern  . Not on file   Social History Narrative   Lives with wife.      ROS: no fevers or chills, productive cough, hemoptysis, dysphasia, odynophagia, melena, hematochezia, dysuria, hematuria, rash, seizure activity, orthopnea, PND, claudication. Remaining systems are negative.  Physical Exam: Well-developed well-nourished in no acute distress.  Skin is warm and dry.  HEENT is normal.  Neck is supple.  Chest is clear to auscultation with normal  expansion.  Cardiovascular exam is regular rate and rhythm.  Abdominal exam nontender or distended. No masses palpated. Extremities show 1+ edema. neuro grossly intact

## 2014-11-20 ENCOUNTER — Ambulatory Visit: Payer: Medicare Other | Admitting: Cardiology

## 2014-11-20 ENCOUNTER — Ambulatory Visit (INDEPENDENT_AMBULATORY_CARE_PROVIDER_SITE_OTHER): Payer: Medicare Other | Admitting: Cardiology

## 2014-11-20 ENCOUNTER — Encounter: Payer: Self-pay | Admitting: Cardiology

## 2014-11-20 ENCOUNTER — Ambulatory Visit (HOSPITAL_BASED_OUTPATIENT_CLINIC_OR_DEPARTMENT_OTHER): Payer: Medicare Other

## 2014-11-20 ENCOUNTER — Ambulatory Visit (HOSPITAL_BASED_OUTPATIENT_CLINIC_OR_DEPARTMENT_OTHER): Payer: Medicare Other | Admitting: Hematology and Oncology

## 2014-11-20 ENCOUNTER — Other Ambulatory Visit (HOSPITAL_BASED_OUTPATIENT_CLINIC_OR_DEPARTMENT_OTHER): Payer: Medicare Other

## 2014-11-20 ENCOUNTER — Encounter: Payer: Self-pay | Admitting: Hematology and Oncology

## 2014-11-20 VITALS — BP 128/86 | HR 77 | Ht 71.0 in | Wt 265.1 lb

## 2014-11-20 VITALS — BP 116/42 | HR 72 | Temp 98.3°F | Resp 18 | Ht 61.0 in | Wt 275.9 lb

## 2014-11-20 DIAGNOSIS — E859 Amyloidosis, unspecified: Secondary | ICD-10-CM | POA: Diagnosis not present

## 2014-11-20 DIAGNOSIS — I2510 Atherosclerotic heart disease of native coronary artery without angina pectoris: Secondary | ICD-10-CM

## 2014-11-20 DIAGNOSIS — I5032 Chronic diastolic (congestive) heart failure: Secondary | ICD-10-CM

## 2014-11-20 DIAGNOSIS — R0609 Other forms of dyspnea: Secondary | ICD-10-CM

## 2014-11-20 DIAGNOSIS — I471 Supraventricular tachycardia, unspecified: Secondary | ICD-10-CM

## 2014-11-20 DIAGNOSIS — E7800 Pure hypercholesterolemia, unspecified: Secondary | ICD-10-CM

## 2014-11-20 DIAGNOSIS — D630 Anemia in neoplastic disease: Secondary | ICD-10-CM | POA: Insufficient documentation

## 2014-11-20 DIAGNOSIS — I10 Essential (primary) hypertension: Secondary | ICD-10-CM | POA: Diagnosis not present

## 2014-11-20 DIAGNOSIS — R609 Edema, unspecified: Secondary | ICD-10-CM

## 2014-11-20 DIAGNOSIS — I480 Paroxysmal atrial fibrillation: Secondary | ICD-10-CM | POA: Diagnosis not present

## 2014-11-20 DIAGNOSIS — R600 Localized edema: Secondary | ICD-10-CM

## 2014-11-20 LAB — CBC WITH DIFFERENTIAL/PLATELET
BASO%: 0.6 % (ref 0.0–2.0)
BASOS ABS: 0 10*3/uL (ref 0.0–0.1)
EOS%: 1.3 % (ref 0.0–7.0)
Eosinophils Absolute: 0.1 10*3/uL (ref 0.0–0.5)
HCT: 40 % (ref 38.4–49.9)
HGB: 12.8 g/dL — ABNORMAL LOW (ref 13.0–17.1)
LYMPH#: 1 10*3/uL (ref 0.9–3.3)
LYMPH%: 12.9 % — ABNORMAL LOW (ref 14.0–49.0)
MCH: 31.8 pg (ref 27.2–33.4)
MCHC: 32.1 g/dL (ref 32.0–36.0)
MCV: 99.3 fL — AB (ref 79.3–98.0)
MONO#: 0.9 10*3/uL (ref 0.1–0.9)
MONO%: 11.2 % (ref 0.0–14.0)
NEUT#: 6 10*3/uL (ref 1.5–6.5)
NEUT%: 74 % (ref 39.0–75.0)
Platelets: 174 10*3/uL (ref 140–400)
RBC: 4.03 10*6/uL — ABNORMAL LOW (ref 4.20–5.82)
RDW: 15.8 % — AB (ref 11.0–14.6)
WBC: 8.1 10*3/uL (ref 4.0–10.3)

## 2014-11-20 LAB — COMPREHENSIVE METABOLIC PANEL (CC13)
ALK PHOS: 84 U/L (ref 40–150)
ALT: 16 U/L (ref 0–55)
AST: 15 U/L (ref 5–34)
Albumin: 3.1 g/dL — ABNORMAL LOW (ref 3.5–5.0)
Anion Gap: 11 mEq/L (ref 3–11)
BUN: 25.3 mg/dL (ref 7.0–26.0)
CALCIUM: 8.9 mg/dL (ref 8.4–10.4)
CHLORIDE: 105 meq/L (ref 98–109)
CO2: 26 mEq/L (ref 22–29)
CREATININE: 1.3 mg/dL (ref 0.7–1.3)
EGFR: 53 mL/min/{1.73_m2} — ABNORMAL LOW (ref >90)
Glucose: 100 mg/dl (ref 70–140)
POTASSIUM: 4.2 meq/L (ref 3.5–5.1)
Sodium: 143 mEq/L (ref 136–145)
Total Bilirubin: 0.9 mg/dL (ref 0.20–1.20)
Total Protein: 5.9 g/dL — ABNORMAL LOW (ref 6.4–8.3)

## 2014-11-20 MED ORDER — BORTEZOMIB CHEMO SQ INJECTION 3.5 MG (2.5MG/ML)
1.3000 mg/m2 | Freq: Once | INTRAMUSCULAR | Status: AC
  Administered 2014-11-20: 3.25 mg via SUBCUTANEOUS
  Filled 2014-11-20: qty 3.25

## 2014-11-20 MED ORDER — ONDANSETRON HCL 8 MG PO TABS
ORAL_TABLET | ORAL | Status: AC
  Filled 2014-11-20: qty 1

## 2014-11-20 NOTE — Assessment & Plan Note (Signed)
Clinically, he has no signs of end organ damage. The patient is recommended to continue taking acyclovir for antimicrobial prophylaxis while on Velcade. He tolerates treatment well without any side effects so far. His serum light chains are improving. The patient wants to stop treatment if repeat light chains today show that his serum light chain is less than 3. If that is the case, I will stop his treatment and reschedule appointment to see me back in 3 months.

## 2014-11-20 NOTE — Assessment & Plan Note (Signed)
Blood pressure controlled. Continue present medications. 

## 2014-11-20 NOTE — Patient Instructions (Signed)
[place] Discharge Instructions for Patients Receiving Chemotherapy  Today you received the following chemotherapy agents: Velcade  To help prevent nausea and vomiting after your treatment, we encourage you to take your nausea medication as prescribed by your physician.   If you develop nausea and vomiting that is not controlled by your nausea medication, call the clinic.   BELOW ARE SYMPTOMS THAT SHOULD BE REPORTED IMMEDIATELY:  *FEVER GREATER THAN 100.5 F  *CHILLS WITH OR WITHOUT FEVER  NAUSEA AND VOMITING THAT IS NOT CONTROLLED WITH YOUR NAUSEA MEDICATION  *UNUSUAL SHORTNESS OF BREATH  *UNUSUAL BRUISING OR BLEEDING  TENDERNESS IN MOUTH AND THROAT WITH OR WITHOUT PRESENCE OF ULCERS  *URINARY PROBLEMS  *BOWEL PROBLEMS  UNUSUAL RASH Items with * indicate a potential emergency and should be followed up as soon as possible.  Feel free to call the clinic you have any questions or concerns. The clinic phone number is [phone].

## 2014-11-20 NOTE — Patient Instructions (Signed)
Your physician recommends that you schedule a follow-up appointment in: 3 MONTHS WITH DR CRENSHAW  

## 2014-11-20 NOTE — Assessment & Plan Note (Signed)
Patient's volume status is improving. Continue Lasix 40 mg daily with an additional 40 mg daily for weight gain of 2 pounds or worsening edema. Patient instructed on low sodium diet and fluid restriction.

## 2014-11-20 NOTE — Assessment & Plan Note (Signed)
Continue statin. Not on aspirin given need for anticoagulation. 

## 2014-11-20 NOTE — Assessment & Plan Note (Signed)
This is likely related to diastolic heart failure and fluid retention. Rather than subject him to more medication, I recommend dietary modification, exercise and weight loss. I told him dexamethasone will likely exacerbate this further but the patient is not willing to listen. I would defer to management of fluid retention to his cardiologist.

## 2014-11-20 NOTE — Assessment & Plan Note (Signed)
Continue amiodarone and xarelto.  

## 2014-11-20 NOTE — Assessment & Plan Note (Signed)
Continue amiodarone. 

## 2014-11-20 NOTE — Progress Notes (Signed)
[place] OFFICE PROGRESS NOTE  Patient Care Team: No Pcp Per Patient as PCP - General (General Practice) Lelon Perla, MD (Cardiology) Heath Lark, MD as Consulting Physician (Hematology and Oncology)  SUMMARY OF ONCOLOGIC HISTORY:  He was diagnosed with systemic amyloidosis after presentation with shortness of breath. He has significant evaluation including bone marrow aspirate and biopsy. 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 The patient subsequently went for maintain dense chemotherapy with Velcade for almost 2 years and then switch to Revlimid. He self discontinue Revlimid in May of 2014 due to fatigue. In July 2015, we restarted him back on Velcade due to a rising light chain. In September 2015, the patient requested to add back dexamethasone at 20 mg every week. In November 2015, the patient reduce dexamethasone to 16 mg every week along with Velcade injection  INTERVAL HISTORY: Please see below for problem oriented charting.  he feels well apart from severe fatigue. He felt that recent treatment for cholesterol is cause of this. His shortness of breath on minimal exertion. He has persistent edema. He denies side effects of treatment. He denies neuropathy.  REVIEW OF SYSTEMS:   Constitutional: Denies fevers, chills or abnormal weight loss Eyes: Denies blurriness of vision Ears, nose, mouth, throat, and face: Denies mucositis or sore throat Respiratory: Denies cough, dyspnea or wheezes Cardiovascular: Denies palpitation, chest discomfort  Gastrointestinal:  Denies nausea, heartburn or change in bowel habits Skin: Denies abnormal skin rashes Lymphatics: Denies new lymphadenopathy or easy bruising Neurological:Denies numbness, tingling or  new weaknesses Behavioral/Psych: Mood is stable, no new changes  All other systems were reviewed with the patient and are negative.  I have reviewed the past medical history, past surgical history, social history and family history with the patient and they are unchanged from previous note.  ALLERGIES:  has No Known Allergies.  MEDICATIONS:  Current Outpatient Prescriptions  Medication Sig Dispense Refill  . amiodarone (PACERONE) 200 MG tablet Take 1 tablet (200 mg total) by mouth daily. 30 tablet 11  . dexamethasone (DECADRON) 4 MG tablet Take 5 tablets every week on Wednesdays 60 tablet 3  . furosemide (LASIX) 20 MG tablet Take 2 tablets (40 mg total) by mouth daily.    . nitroGLYCERIN (NITROSTAT) 0.4 MG SL tablet Place 1 tablet (0.4 mg total) under the tongue every 5 (five) minutes as needed for chest pain. 25 tablet 12  . pravastatin (PRAVACHOL) 40 MG tablet Take 1 tablet (40 mg total) by mouth every evening. 30 tablet 11  . rivaroxaban (XARELTO) 20 MG TABS tablet Take 1 tablet (20 mg total) by mouth daily with supper. 90 tablet 3   No current facility-administered medications for this visit.    PHYSICAL EXAMINATION: ECOG PERFORMANCE STATUS: 1 - Symptomatic but completely ambulatory  Filed Vitals:   11/20/14 1036  BP: 116/42  Pulse: 72  Temp: 98.3 F (36.8 C)  Resp: 18   Filed Weights   11/20/14 1036  Weight: 275 lb 14.4 oz (125.147 kg)    GENERAL:alert, no distress and comfortable. He is morbidly obese SKIN: skin color, texture, turgor are normal, no rashes or significant lesions EYES: normal, Conjunctiva are pink and non-injected, sclera clear OROPHARYNX:no exudate, no erythema and lips, buccal mucosa, and tongue normal  NECK: supple, thyroid normal size, non-tender, without nodularity LYMPH:  no palpable lymphadenopathy in the cervical, axillary or inguinal LUNGS: clear to auscultation and percussion with normal breathing effort HEART: regular rate & rhythm and no  murmurs  With moderate bilaterallower extremity edema ABDOMEN:abdomen soft, non-tender and normal bowel sounds Musculoskeletal:no cyanosis of digits and no clubbing  NEURO: alert & oriented x 3 with fluent speech, no focal motor/sensory deficits  LABORATORY DATA:  I have reviewed the data as listed    Component Value Date/Time   NA 143 11/20/2014 0952   NA 142 10/02/2014 0851   K 4.2 11/20/2014 0952   K 3.9 10/02/2014 0851   CL 98 10/02/2014 0851   CL 103 05/31/2013 0842   CO2 26 11/20/2014 0952   CO2 33* 10/02/2014 0851   GLUCOSE 100 11/20/2014 0952   GLUCOSE 121* 10/02/2014 0851   GLUCOSE 118* 05/31/2013 0842   BUN 25.3 11/20/2014 0952   BUN 23 10/02/2014 0851   CREATININE 1.3 11/20/2014 0952   CREATININE 1.41* 10/02/2014 0851   CREATININE 1.13 06/10/2014 1221   CREATININE 0.92 12/17/2011 1606   CALCIUM 8.9 11/20/2014 0952   CALCIUM 9.0 10/02/2014 0851   PROT 5.9* 11/20/2014 0952   PROT 6.7 10/02/2014 0851   ALBUMIN 3.1* 11/20/2014 0952   ALBUMIN 3.4* 10/02/2014 0851   AST 15 11/20/2014 0952   AST 17 10/02/2014 0851   ALT 16 11/20/2014 0952   ALT 14 10/02/2014 0851   ALKPHOS 84 11/20/2014 0952   ALKPHOS 93 10/02/2014 0851   BILITOT 0.90 11/20/2014 0952   BILITOT 0.8 10/02/2014 0851   GFRNONAA 63 06/10/2014 1221   GFRNONAA 45* 06/01/2013 0548   GFRAA 73 06/10/2014 1221   GFRAA 52* 06/01/2013 0548    No results found for: SPEP, UPEP  Lab Results  Component Value Date   WBC 8.1 11/20/2014   NEUTROABS 6.0 11/20/2014   HGB 12.8* 11/20/2014   HCT 40.0 11/20/2014   MCV 99.3* 11/20/2014   PLT 174 11/20/2014      Chemistry      Component Value Date/Time   NA 143 11/20/2014 0952   NA 142 10/02/2014 0851   K 4.2 11/20/2014 0952   K 3.9 10/02/2014 0851   CL 98 10/02/2014 0851   CL 103 05/31/2013 0842   CO2 26 11/20/2014 0952   CO2 33* 10/02/2014 0851   BUN 25.3 11/20/2014 0952   BUN 23 10/02/2014 0851   CREATININE 1.3 11/20/2014 0952   CREATININE 1.41*  10/02/2014 0851   CREATININE 1.13 06/10/2014 1221   CREATININE 0.92 12/17/2011 1606      Component Value Date/Time   CALCIUM 8.9 11/20/2014 0952   CALCIUM 9.0 10/02/2014 0851   ALKPHOS 84 11/20/2014 0952   ALKPHOS 93 10/02/2014 0851   AST 15 11/20/2014 0952   AST 17 10/02/2014 0851   ALT 16 11/20/2014 0952   ALT 14 10/02/2014 0851   BILITOT 0.90 11/20/2014 0952   BILITOT 0.8 10/02/2014 0851     ASSESSMENT & PLAN:  Amyloidosis Clinically, he has no signs of end organ damage. The patient is recommended to continue taking acyclovir for antimicrobial prophylaxis while on Velcade. He tolerates treatment well without any side effects so far. His serum light chains are improving. The patient wants to stop treatment if repeat light chains today show that his serum light chain is less than 3. If that is the case, I will stop his treatment and reschedule appointment to see me back in 3 months.     Dyspnea on exertion This is  multifactorial, related to morbid obesity, cardiac amyloidosis and mild fluid retention. I would recommend he discuss medical management with his cardiologist.  Pedal edema This is likely related to diastolic heart failure and fluid retention. Rather than subject him to more medication, I recommend dietary modification, exercise and weight loss. I told him dexamethasone will likely exacerbate this further but the patient is not willing to listen. I would defer to management of fluid retention to his cardiologist.  Anemia in neoplastic disease This is likely anemia of chronic disease. The patient denies recent history of bleeding such as epistaxis, hematuria or hematochezia. He is asymptomatic from the anemia. We will observe for now.  I do not believe this is the cause of his fatigue.      All questions were answered. The patient knows to call the clinic with any problems, questions or concerns. No barriers to learning was detected. I spent 30 minutes counseling the  patient face to face. The total time spent in the appointment was 40 minutes and more than 50% was on counseling and review of test results     [facility], [place], MD 11/20/2014 10:07 PM

## 2014-11-20 NOTE — Assessment & Plan Note (Signed)
This is likely anemia of chronic disease. The patient denies recent history of bleeding such as epistaxis, hematuria or hematochezia. He is asymptomatic from the anemia. We will observe for now.  I do not believe this is the cause of his fatigue.

## 2014-11-20 NOTE — Assessment & Plan Note (Signed)
Continue statin. 

## 2014-11-20 NOTE — Assessment & Plan Note (Addendum)
This is multifactorial, related to morbid obesity, cardiac amyloidosis and mild fluid retention. I would recommend he discuss medical management with his cardiologist.

## 2014-11-20 NOTE — Assessment & Plan Note (Signed)
Followed by oncology 

## 2014-11-22 ENCOUNTER — Telehealth: Payer: Self-pay | Admitting: Hematology and Oncology

## 2014-11-22 ENCOUNTER — Other Ambulatory Visit: Payer: Self-pay | Admitting: Hematology and Oncology

## 2014-11-22 LAB — SPEP & IFE WITH QIG
ALBUMIN ELP: 57.2 % (ref 55.8–66.1)
Alpha-1-Globulin: 5 % — ABNORMAL HIGH (ref 2.9–4.9)
Alpha-2-Globulin: 16.8 % — ABNORMAL HIGH (ref 7.1–11.8)
Beta 2: 5.8 % (ref 3.2–6.5)
Beta Globulin: 5.7 % (ref 4.7–7.2)
Gamma Globulin: 9.5 % — ABNORMAL LOW (ref 11.1–18.8)
IgA: 118 mg/dL (ref 68–379)
IgG (Immunoglobin G), Serum: 621 mg/dL — ABNORMAL LOW (ref 650–1600)
IgM, Serum: 25 mg/dL — ABNORMAL LOW (ref 41–251)
TOTAL PROTEIN, SERUM ELECTROPHOR: 5.9 g/dL — AB (ref 6.0–8.3)

## 2014-11-22 LAB — KAPPA/LAMBDA LIGHT CHAINS
KAPPA LAMBDA RATIO: 1.88 — AB (ref 0.26–1.65)
Kappa free light chain: 2.16 mg/dL — ABNORMAL HIGH (ref 0.33–1.94)
LAMBDA FREE LGHT CHN: 1.15 mg/dL (ref 0.57–2.63)

## 2014-11-22 LAB — BETA 2 MICROGLOBULIN, SERUM: Beta-2 Microglobulin: 3.55 mg/L — ABNORMAL HIGH (ref <=2.51)

## 2014-11-22 NOTE — Telephone Encounter (Signed)
Left patient a voicemail. I would discontinue his chemotherapy as previously discussed. I will place order to have him return in April with repeat blood work every week prior.

## 2014-11-25 ENCOUNTER — Other Ambulatory Visit: Payer: Self-pay | Admitting: Hematology and Oncology

## 2014-12-02 ENCOUNTER — Encounter: Payer: Self-pay | Admitting: Internal Medicine

## 2014-12-02 ENCOUNTER — Ambulatory Visit (INDEPENDENT_AMBULATORY_CARE_PROVIDER_SITE_OTHER): Payer: Medicare Other | Admitting: Internal Medicine

## 2014-12-02 VITALS — BP 104/61 | HR 72 | Temp 97.9°F | Wt 275.0 lb

## 2014-12-02 DIAGNOSIS — E785 Hyperlipidemia, unspecified: Secondary | ICD-10-CM | POA: Diagnosis not present

## 2014-12-02 DIAGNOSIS — E7800 Pure hypercholesterolemia, unspecified: Secondary | ICD-10-CM

## 2014-12-02 DIAGNOSIS — D126 Benign neoplasm of colon, unspecified: Secondary | ICD-10-CM

## 2014-12-02 DIAGNOSIS — M545 Low back pain, unspecified: Secondary | ICD-10-CM

## 2014-12-02 DIAGNOSIS — I2510 Atherosclerotic heart disease of native coronary artery without angina pectoris: Secondary | ICD-10-CM | POA: Diagnosis not present

## 2014-12-02 DIAGNOSIS — I10 Essential (primary) hypertension: Secondary | ICD-10-CM | POA: Diagnosis not present

## 2014-12-02 NOTE — Assessment & Plan Note (Addendum)
Had colonoscopy in 2010, recently received a letter that stated he does not need follow-up colonoscopy, patient is concerned about the issue. I told him that a colonoscopy is not a risk-free procedure, our GI doctors calculated risk and benefit and the recommendations are based on that. At the end of the conversation he still felt that he would like to proceed, will refer to GI for eval.

## 2014-12-02 NOTE — Assessment & Plan Note (Signed)
On Pravachol, check FLP

## 2014-12-02 NOTE — Patient Instructions (Signed)
Stop by the front desk and schedule labs to be done within few days (fasting)   Please come back to the office in 6 months a routine visit

## 2014-12-02 NOTE — Progress Notes (Signed)
Pre visit review using our clinic review tool, if applicable. No additional management support is needed unless otherwise documented below in the visit note. 

## 2014-12-02 NOTE — Progress Notes (Signed)
Subjective:    Patient ID: Johnny Mitchell, male    DOB: 1941/03/01, 73 y.o.   MRN: [id number]  DOS:  12/02/2014 Type of visit - description : New patient, last visit with previous PCP Dr. Lenna Gilford 12-2012 for bronchitis Interval history: Johnny Mitchell is a 73 year old gentleman transferring from Dr. Lenna Gilford, he has a history of CAD, CHF, atrial fibrillation on anticoagulation, hypertension, hyperlipidemia, amyloidoses. Note from the most recent cardiology visit reviewed, he was felt to be stable Saw oncology this month, he is on Velcade, was recommended to continue acyclovir prophylactically Has many years history of back pain, would like to explore some treatment. See assessment and plan. Concern about a colonoscopy, see assessment and plan. Labs reviewed, most recent BMP, CBC LFTs and TSH are stable.  ROS Denies chest pain, difficulty breathing. Still has lower extremity edema mostly at the end of the day, on Lasix, follow-up by cardiology. No nausea, vomiting, diarrhea or blood in the stools.   Past Medical History  Diagnosis Date  . Hypertension   . Coronary atherosclerosis of unspecified type of vessel, native or graft     Non obstructive  . Cerebrovascular disease, unspecified   . Pure hypercholesterolemia   . Diverticulosis of colon (without mention of hemorrhage)   . Benign neoplasm of colon   . Unspecified hemorrhoids without mention of complication   . Degenerative disc disease   . 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 Hemangioma   . Amyloidosis   . LBBB (left bundle branch block)   . 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 OSA (obstructive sleep apnea)   . Atrial fibrillation   . Cancer     amyloidosis  . Edema 08/29/2013  . Hx of cardiovascular stress test     Adenosine Myoview (02/2014): Apical thinning, no ischemia, EF 48%; low risk.  . Scrotal pain 10/23/2014    Past Surgical History  Procedure Laterality  Date  . Knee arthroscopy    . Carpal tunnel release  08/2006    Dr Doy Mince at [facility] - Bilateral  . 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 Back surgery      Lumbar  . Cardioversion N/A 07/10/2013    Procedure: CARDIOVERSION;  Surgeon: Lelon Perla, MD;  Location: [facility] ENDOSCOPY;  Service: Cardiovascular;  Laterality: N/A;    History   Social History  . Marital Status: Married    Spouse Name: N/A    Number of Children: 2  . Years of Education: N/A   Occupational History  . PRESIDENT     [employer]   Social History Main Topics  . Smoking status: Never Smoker   . Smokeless tobacco: Never Used  . Alcohol Use: Yes  . Drug Use: No  . Sexual Activity: No   Other Topics Concern  . Not on file   Social History Narrative   Lives with wife.          Medication List       This list is accurate as of: 12/02/14  6:34 PM.  Always use your most recent med list.               acyclovir 400 MG tablet  Commonly known as:  ZOVIRAX  Take 400 mg by mouth daily.     amiodarone 200 MG tablet  Commonly known as:  PACERONE  Take 1 tablet (200 mg total) by mouth daily.  dexamethasone 4 MG tablet  Commonly known as:  DECADRON  Take 5 tablets every week on Wednesdays     furosemide 40 MG tablet  Commonly known as:  LASIX  Take 40 mg by mouth daily.     nitroGLYCERIN 0.4 MG SL tablet  Commonly known as:  NITROSTAT  Place 1 tablet (0.4 mg total) under the tongue every 5 (five) minutes as needed for chest pain.     pravastatin 40 MG tablet  Commonly known as:  PRAVACHOL  Take 1 tablet (40 mg total) by mouth every evening.     rivaroxaban 20 MG Tabs tablet  Commonly known as:  XARELTO  Take 1 tablet (20 mg total) by mouth daily with supper.           Objective:   Physical Exam BP 104/61 mmHg  Pulse 72  Temp(Src) 97.9 F (36.6 C) (Oral)  Wt 275 lb (124.739 kg)  SpO2 96% General -- alert, well-developed, NAD.   Lungs -- normal respiratory effort, no  intercostal retractions, no accessory muscle use, and normal breath sounds.  Heart-- normal rate, regular rhythm, no murmur.  Extremities-- +/+++ pretibial edema bilaterally  Neurologic--  alert & oriented X3. Speech normal, gait appropriate for age, strength symmetric and appropriate for age.  Psych-- Cognition and judgment appear intact. Cooperative with normal attention span and concentration. No anxious or depressed appearing.     Assessment & Plan:

## 2014-12-02 NOTE — Assessment & Plan Note (Signed)
Symptoms well-controlled, continue with Lasix, last BMP within normal

## 2014-12-02 NOTE — Assessment & Plan Note (Signed)
Many years history of back pain, status post surgery. Most recently he saw Dr. Ellene Route 2014, a MRI was done, he was not recommended  surgery. In the past have tried physical therapy,  local injections. He has available oxycodone but does not like to take it.  He still has pain  and likes further eval, will be referred to [place]

## 2014-12-03 ENCOUNTER — Encounter: Payer: Self-pay | Admitting: Internal Medicine

## 2014-12-13 DIAGNOSIS — I872 Venous insufficiency (chronic) (peripheral): Secondary | ICD-10-CM

## 2014-12-13 HISTORY — DX: Venous insufficiency (chronic) (peripheral): I87.2

## 2014-12-22 ENCOUNTER — Telehealth: Payer: Self-pay | Admitting: Internal Medicine

## 2014-12-22 NOTE — Telephone Encounter (Signed)
Advise  patient, needs a FLP, please arrange

## 2014-12-23 NOTE — Telephone Encounter (Signed)
Letter printed and mailed to Pt informing her she is due for cholesterol recheck.

## 2015-01-08 ENCOUNTER — Other Ambulatory Visit: Payer: Self-pay | Admitting: Hematology and Oncology

## 2015-01-08 ENCOUNTER — Other Ambulatory Visit: Payer: Self-pay | Admitting: Physician Assistant

## 2015-01-10 ENCOUNTER — Telehealth: Payer: Self-pay | Admitting: *Deleted

## 2015-01-10 NOTE — Telephone Encounter (Signed)
Informed pt of refill on Acyclovir sent to [facility].  He verbalized understanding.

## 2015-01-23 ENCOUNTER — Other Ambulatory Visit: Payer: Self-pay | Admitting: Internal Medicine

## 2015-01-23 ENCOUNTER — Encounter: Payer: Self-pay | Admitting: Internal Medicine

## 2015-01-23 ENCOUNTER — Ambulatory Visit (INDEPENDENT_AMBULATORY_CARE_PROVIDER_SITE_OTHER): Payer: Medicare Other | Admitting: Internal Medicine

## 2015-01-23 VITALS — BP 128/70 | HR 68 | Ht 68.75 in | Wt 274.2 lb

## 2015-01-23 DIAGNOSIS — Z86010 Personal history of colonic polyps: Secondary | ICD-10-CM

## 2015-01-23 DIAGNOSIS — I480 Paroxysmal atrial fibrillation: Secondary | ICD-10-CM | POA: Diagnosis not present

## 2015-01-23 DIAGNOSIS — K625 Hemorrhage of anus and rectum: Secondary | ICD-10-CM | POA: Diagnosis not present

## 2015-01-23 DIAGNOSIS — Z7901 Long term (current) use of anticoagulants: Secondary | ICD-10-CM | POA: Diagnosis not present

## 2015-01-23 DIAGNOSIS — I10 Essential (primary) hypertension: Secondary | ICD-10-CM

## 2015-01-23 MED ORDER — RIVAROXABAN 20 MG PO TABS: 20.0000 mg | ORAL_TABLET | Freq: Every day | ORAL | Status: DC

## 2015-01-23 NOTE — Patient Instructions (Addendum)
You have been set up for a virtual colonoscopy at [facility] for February 17th 2016.  They are located at [address], [address].  This is the [facility].  Please go by today and pick up your prep supplies and instructions.  Their phone # [phone].     I appreciate the opportunity to care for you.

## 2015-01-23 NOTE — Progress Notes (Signed)
Subjective:    Patient ID: Johnny Mitchell, male    DOB: 1941-04-17, 74 y.o.   MRN: [id number] Chief complaint: Rectal bleeding, I want a virtual colonoscopy HPI The patient is a 74 year old man with a past medical history of amyloidosis status post bone marrow transplant, a remote history of colon polyp in 2003 with negative colonoscopy for neoplasia since. This was last done in 2010. I had sent him a letter changing his recall from 5-10 years based upon current expert guidelines. He has had rectal bleeding in the past thought to be due to hemorrhoids. He has since started Xarelto to prevent stroke and atrial fibrillation. He has paroxysmal atrial fibrillation. He is concerned that he could've developed polyps or cancer and wants to be checked again. He has been having some intermittent rectal bleeding both with wiping but he does pass some darker blood into the commode at times though apparently this is rare, I cannot get a clear fix on how often or when that happened last from talking to him. He denies abdominal pain, change in bowel habits. GI review of systems seems otherwise negative. No Known Allergies Outpatient Prescriptions Prior to Visit  Medication Sig Dispense Refill  . acyclovir (ZOVIRAX) 400 MG tablet TAKE ONE TABLET BY MOUTH ONCE DAILY 90 tablet 0  . amiodarone (PACERONE) 200 MG tablet TAKE ONE TABLET BY MOUTH ONCE DAILY 30 tablet 2  . dexamethasone (DECADRON) 4 MG tablet Take 5 tablets every week on Wednesdays 60 tablet 3  . furosemide (LASIX) 40 MG tablet Take 40 mg by mouth daily.    . nitroGLYCERIN (NITROSTAT) 0.4 MG SL tablet Place 1 tablet (0.4 mg total) under the tongue every 5 (five) minutes as needed for chest pain. 25 tablet 12  . pravastatin (PRAVACHOL) 40 MG tablet Take 1 tablet (40 mg total) by mouth every evening. 30 tablet 11  . rivaroxaban (XARELTO) 20 MG TABS tablet Take 1 tablet (20 mg total) by mouth daily with supper. 90 tablet 3   No facility-administered  medications prior to visit.   Past Medical History  Diagnosis Date  . Hypertension   . Coronary atherosclerosis of unspecified type of vessel, native or graft     Non obstructive  . Cerebrovascular disease, unspecified   . Pure hypercholesterolemia   . Diverticulosis of colon (without mention of hemorrhage)   . Benign neoplasm of colon   . Unspecified hemorrhoids without mention of complication   . Degenerative disc disease   . 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 Hemangioma   . Amyloidosis   . LBBB (left bundle branch block)   . 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 OSA (obstructive sleep apnea)   . Atrial fibrillation   . Cancer     amyloidosis  . Edema 08/29/2013  . Hx of cardiovascular stress test     Adenosine Myoview (02/2014): Apical thinning, no ischemia, EF 48%; low risk.  . Scrotal pain 10/23/2014  . Adenomatous colon polyp 2003    max size 43mm   Past Surgical History  Procedure Laterality Date  . Knee arthroscopy    . Carpal tunnel release  08/2006    Dr Doy Mince at [facility] - Bilateral  . 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 Back surgery      Lumbar  . Cardioversion N/A 07/10/2013    Procedure: CARDIOVERSION;  Surgeon: Lelon Perla, MD;  Location: [facility] ENDOSCOPY;  Service: Cardiovascular;  Laterality: N/A;  . Colonoscopy w/ biopsies     History   Social History  . Marital Status: Married    Spouse Name: N/A  . Number of Children: 2  . Years of Education: N/A   Occupational History  . PRESIDENT     [employer]   Social History Main Topics  . Smoking status: Never Smoker   . Smokeless tobacco: Never Used  . Alcohol Use: 0.0 oz/week    0 Standard drinks or equivalent per week     Comment: Occassionally  . Drug Use: No  . Sexual Activity: No   Other Topics Concern  . None   Social History Narrative   Lives with wife.     Family History  Problem Relation Age of Onset  . Lung  cancer Mother   . Skin cancer Father     Melanoma  . Colon cancer Neg Hx   . Colon polyps Neg Hx   . Kidney disease Neg Hx   . Esophageal cancer Neg Hx   . Diabetes Neg Hx   . Gallbladder disease Neg Hx     Review of Systems He has chronic intermittent back pain worse when walking, he has some dyspnea on exertion. He has pedal edema. He has arthritis pains in multiple joints. All other review of systems are negative. Or as per history of present illness.    Objective:   Physical Exam General:  Well-developed, well-nourished and in no acute distress Eyes:  anicteric. Neck:   supple w/o thyromegaly or mass.  Lungs: Clear to auscultation bilaterally. Heart:  S1S2, no rubs, murmurs, gallops. Abdomen:  Obese soft, non-tender, no hepatosplenomegaly, hernia, or mass and BS+.  Lymph:  no cervical or supraclavicular adenopathy. Extremities:   trace lower extremity edema, no cyanosis or clubbing Skin   no rash. Neuro:  A&O x 3.  Psych:  appropriate mood and  Affect.   Data Reviewed: Previous colonoscopies, pathology reports cardiology notes in the last year. Labs in the EMR. Primary care notes from the last year. Oncology note in the last year.    Assessment & Plan:   1. Rectal bleeding   2. History of colonic polyps   3. Paroxysmal atrial fibrillation   4. Long term current use of anticoagulant therapy       I had an extensive conversation with the patient on more than one occasion during this visit, I was called back into his room. I explained the risks benefits and indications of optical versus CT or virtual colonoscopy. I explained that how in most cases with rectal bleeding and history of polyps it did not make sense to do a virtual colonoscopy. He agreed to have an optical colonoscopy and hold his Xarelto, understanding the rare but real increased risk of stroke beyond bleeding, infection, medication reaction perforation which are typical problems with colonoscopy complications of  those are all rare as well. I think that with bleeding, even though most likely hemorrhoidal, an optical colonoscopy with possible chance of polypectomy or other treatment of bleeding lesions is most sensible.  After originally siding to pursue that, he changed his mind and wants to pursue CT colonoscopy. He understands that I think that medically this is not the most sensible choice but it is his option. He knows that this is not covered by Medicare and he will have to pay cash for it. I've explained that even if it is negative we can't be assured that we have  determined that there is no significant serious cause of his bleeding like cancer or other bleeding lesions.  60 minutes of total time spent on this visit. CC: [name], MD

## 2015-01-29 ENCOUNTER — Other Ambulatory Visit: Payer: Medicare Other

## 2015-01-31 ENCOUNTER — Inpatient Hospital Stay: Admission: RE | Admit: 2015-01-31 | Payer: Medicare Other | Source: Ambulatory Visit

## 2015-02-04 ENCOUNTER — Telehealth: Payer: Self-pay | Admitting: Hematology and Oncology

## 2015-02-04 NOTE — Telephone Encounter (Signed)
Faxed pt medical records to [facility]

## 2015-02-05 ENCOUNTER — Encounter: Payer: Medicare Other | Admitting: Internal Medicine

## 2015-02-15 ENCOUNTER — Encounter (HOSPITAL_COMMUNITY): Payer: Self-pay | Admitting: Emergency Medicine

## 2015-02-15 ENCOUNTER — Emergency Department (HOSPITAL_COMMUNITY)
Admission: EM | Admit: 2015-02-15 | Discharge: 2015-02-15 | Disposition: A | Discharge status: Home / Self Care | Payer: Medicare Other | Attending: Emergency Medicine | Admitting: Emergency Medicine

## 2015-02-15 DIAGNOSIS — M479 Spondylosis, unspecified: Secondary | ICD-10-CM | POA: Insufficient documentation

## 2015-02-15 DIAGNOSIS — Z86010 Personal history of colonic polyps: Secondary | ICD-10-CM | POA: Diagnosis not present

## 2015-02-15 DIAGNOSIS — Z87438 Personal history of other diseases of male genital organs: Secondary | ICD-10-CM | POA: Diagnosis not present

## 2015-02-15 DIAGNOSIS — Y9301 Activity, walking, marching and hiking: Secondary | ICD-10-CM | POA: Insufficient documentation

## 2015-02-15 DIAGNOSIS — S41112A Laceration without foreign body of left upper arm, initial encounter: Secondary | ICD-10-CM

## 2015-02-15 DIAGNOSIS — Z8719 Personal history of other diseases of the digestive system: Secondary | ICD-10-CM | POA: Diagnosis not present

## 2015-02-15 DIAGNOSIS — W010XXA Fall on same level from slipping, tripping and stumbling without subsequent striking against object, initial encounter: Secondary | ICD-10-CM | POA: Diagnosis not present

## 2015-02-15 DIAGNOSIS — Z8669 Personal history of other diseases of the nervous system and sense organs: Secondary | ICD-10-CM | POA: Diagnosis not present

## 2015-02-15 DIAGNOSIS — I4891 Unspecified atrial fibrillation: Secondary | ICD-10-CM | POA: Diagnosis not present

## 2015-02-15 DIAGNOSIS — Z7901 Long term (current) use of anticoagulants: Secondary | ICD-10-CM | POA: Insufficient documentation

## 2015-02-15 DIAGNOSIS — Y998 Other external cause status: Secondary | ICD-10-CM | POA: Insufficient documentation

## 2015-02-15 DIAGNOSIS — E780 Pure hypercholesterolemia: Secondary | ICD-10-CM | POA: Diagnosis not present

## 2015-02-15 DIAGNOSIS — S51812A Laceration without foreign body of left forearm, initial encounter: Secondary | ICD-10-CM | POA: Insufficient documentation

## 2015-02-15 DIAGNOSIS — Y9289 Other specified places as the place of occurrence of the external cause: Secondary | ICD-10-CM | POA: Diagnosis not present

## 2015-02-15 DIAGNOSIS — S81812A Laceration without foreign body, left lower leg, initial encounter: Secondary | ICD-10-CM | POA: Insufficient documentation

## 2015-02-15 DIAGNOSIS — I10 Essential (primary) hypertension: Secondary | ICD-10-CM | POA: Insufficient documentation

## 2015-02-15 DIAGNOSIS — W19XXXA Unspecified fall, initial encounter: Secondary | ICD-10-CM

## 2015-02-15 DIAGNOSIS — S91312A Laceration without foreign body, left foot, initial encounter: Secondary | ICD-10-CM | POA: Diagnosis not present

## 2015-02-15 MED ORDER — LIDOCAINE-EPINEPHRINE (PF) 2 %-1:200000 IJ SOLN
INTRAMUSCULAR | Status: AC
  Administered 2015-02-15: 20 mL
  Filled 2015-02-15: qty 20

## 2015-02-15 MED ORDER — LIDOCAINE-EPINEPHRINE 2 %-1:100000 IJ SOLN: 20.0000 mL | Freq: Once | INTRAMUSCULAR | Status: DC

## 2015-02-15 NOTE — ED Provider Notes (Signed)
CSN: [id number]     Arrival date & time 02/15/15  1931 History   First MD Initiated Contact with Patient 02/15/15 2034     Chief Complaint  Patient presents with  . Extremity Laceration    left leg     (Consider location/radiation/quality/duration/timing/severity/associated sxs/prior Treatment) The history is provided by the patient.   Johnny Mitchell is a 74 y.o. male who is here for evaluation of a left leg injury after fall, while getting food from the cafeteria. He tripped while walking over a grassy area with fully attached, and fell, injuring his left lower leg. He was able to get up and walk afterwards without problems. He denies headache, neck pain or back pain. There are no other known modifying factors.   Past Medical History  Diagnosis Date  . Hypertension   . Coronary atherosclerosis of unspecified type of vessel, native or graft     Non obstructive  . Cerebrovascular disease, unspecified   . Pure hypercholesterolemia   . Diverticulosis of colon (without mention of hemorrhage)   . Benign neoplasm of colon   . Unspecified hemorrhoids without mention of complication   . Degenerative disc disease   . 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 Hemangioma   . Amyloidosis   . LBBB (left bundle branch block)   . 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 OSA (obstructive sleep apnea)   . Atrial fibrillation   . Cancer     amyloidosis  . Edema 08/29/2013  . Hx of cardiovascular stress test     Adenosine Myoview (02/2014): Apical thinning, no ischemia, EF 48%; low risk.  . Scrotal pain 10/23/2014  . Adenomatous colon polyp 2003    max size 50mm   Past Surgical History  Procedure Laterality Date  . Knee arthroscopy    . Carpal tunnel release  08/2006    Dr Doy Mince at [facility] - Bilateral  . 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 Back surgery      Lumbar  . Cardioversion N/A 07/10/2013    Procedure: CARDIOVERSION;   Surgeon: Lelon Perla, MD;  Location: [facility] ENDOSCOPY;  Service: Cardiovascular;  Laterality: N/A;  . Colonoscopy w/ biopsies     Family History  Problem Relation Age of Onset  . Lung cancer Mother   . Skin cancer Father     Melanoma  . Colon cancer Neg Hx   . Colon polyps Neg Hx   . Kidney disease Neg Hx   . Esophageal cancer Neg Hx   . Diabetes Neg Hx   . Gallbladder disease Neg Hx    History  Substance Use Topics  . Smoking status: Never Smoker   . Smokeless tobacco: Never Used  . Alcohol Use: 0.0 oz/week    0 Standard drinks or equivalent per week     Comment: Occassionally    Review of Systems  All other systems reviewed and are negative.     Allergies  Review of patient's allergies indicates no known allergies.  Home Medications   Prior to Admission medications   Medication Sig Start Date End Date Taking? Authorizing Provider  acyclovir (ZOVIRAX) 400 MG tablet TAKE ONE TABLET BY MOUTH ONCE DAILY 01/09/15  Yes Heath Lark, MD  amiodarone (PACERONE) 200 MG tablet TAKE ONE TABLET BY MOUTH ONCE DAILY 01/10/15  Yes Lelon Perla, MD  furosemide (LASIX) 40 MG tablet Take 40 mg by mouth daily.  Yes Historical Provider, MD  nitroGLYCERIN (NITROSTAT) 0.4 MG SL tablet Place 1 tablet (0.4 mg total) under the tongue every 5 (five) minutes as needed for chest pain. 02/20/14  Yes Lelon Perla, MD  pravastatin (PRAVACHOL) 40 MG tablet Take 1 tablet (40 mg total) by mouth every evening. 02/20/14 02/20/15 Yes Lelon Perla, MD  rivaroxaban (XARELTO) 20 MG TABS tablet Take 1 tablet (20 mg total) by mouth daily with supper. 01/23/15  Yes Gatha Mayer, MD  Turmeric 500 MG CAPS Take 1,000 mg by mouth daily.   Yes Historical Provider, MD  dexamethasone (DECADRON) 4 MG tablet Take 5 tablets every week on Wednesdays 08/21/14   Ni Gorsuch, MD   BP 129/78 mmHg  Pulse 65  Temp(Src) 98.4 F (36.9 C) (Oral)  Resp 20  SpO2 94% Physical Exam  Constitutional: He is oriented to person,  place, and time. He appears well-developed and well-nourished.  HENT:  Head: Normocephalic and atraumatic.  Right Ear: External ear normal.  Left Ear: External ear normal.  Eyes: Conjunctivae and EOM are normal. Pupils are equal, round, and reactive to light.  Neck: Normal range of motion and phonation normal. Neck supple.  Cardiovascular: Normal rate.   Pulmonary/Chest: Effort normal. He exhibits no bony tenderness.  Musculoskeletal: Normal range of motion.  Large left lower leg laceration, gaping and with subcutaneous hematoma on the lateral aspect. Wound is vertical in alignment. Neurovascular intact distally in the left foot. Superficial laceration, left forearm, not bleeding. This wound does not require suture repair  Neurological: He is alert and oriented to person, place, and time. No cranial nerve deficit or sensory deficit. He exhibits normal muscle tone. Coordination normal.  Skin: Skin is warm, dry and intact.  Psychiatric: He has a normal mood and affect. His behavior is normal. Judgment and thought content normal.  Nursing note and vitals reviewed.   ED Course  Procedures (including critical care time)   LACERATION REPAIR Performed by: Richarda Blade Consent: Verbal consent obtained. Risks and benefits: risks, benefits and alternatives were discussed Patient identity confirmed: provided demographic data Time out performed prior to procedure Prepped and Draped in normal sterile fashion Wound explored Laceration Location: left lower leg Laceration Length: 8.5 cm No Foreign Bodies seen or palpated Anesthesia: local infiltration Local anesthetic: lidocaine 2% with epinephrine Anesthetic total: 10 ml Irrigation method: syringe Amount of cleaning: standard Skin closure: 3-0 Prolene Number of sutures or staples: 9 Technique: simple and mattress Patient tolerance: Patient tolerated the procedure well with no immediate complications.  Labs Review Labs Reviewed - No  data to display  Imaging Review No results found.   EKG Interpretation None      MDM   Final diagnoses:  Laceration of leg, left, initial encounter  Laceration of arm, left, initial encounter  Fall, initial encounter    Followed by injury, laceration, without suspected fracture.  Nursing Notes Reviewed/ Care Coordinated Applicable Imaging Reviewed Interpretation of Laboratory Data incorporated into ED treatment  The patient appears reasonably screened and/or stabilized for discharge and I doubt any other medical condition or other [facility] requiring further screening, evaluation, or treatment in the ED at this time prior to discharge.  Plan: Home Medications- usual; Home Treatments-elevate left leg,  rest; return here if the recommended treatment, does not improve the symptoms; Recommended follow up- PCP prn. Sutures out 12 days     Richarda Blade, MD 02/16/15 [phone]

## 2015-02-15 NOTE — ED Notes (Signed)
Patients he was walking and fell, cutting his leg through his pants creating a laceration. Patient also has a cut to the left arm. Patient does take blood thinners. Bleeding controlled at this time with washcloth and ace bandage. Patient is unable to state when he last had a tetanus shot, states it should be listed in his records here. Patient is A&Ox4, NAD noted.

## 2015-02-15 NOTE — Discharge Instructions (Signed)
Laceration Care, Adult °A laceration is a cut or lesion that goes through all layers of the skin and into the tissue just beneath the skin. °TREATMENT  °Some lacerations may not require closure. Some lacerations may not be able to be closed due to an increased risk of infection. It is important to see your caregiver as soon as possible after an injury to minimize the risk of infection and maximize the opportunity for successful closure. °If closure is appropriate, pain medicines may be given, if needed. The wound will be cleaned to help prevent infection. Your caregiver will use stitches (sutures), staples, wound glue (adhesive), or skin adhesive strips to repair the laceration. These tools bring the skin edges together to allow for faster healing and a better cosmetic outcome. However, all wounds will heal with a scar. Once the wound has healed, scarring can be minimized by covering the wound with sunscreen during the day for 1 full year. °HOME CARE INSTRUCTIONS  °For sutures or staples: °· Keep the wound clean and dry. °· If you were given a bandage (dressing), you should change it at least once a day. Also, change the dressing if it becomes wet or dirty, or as directed by your caregiver. °· Wash the wound with soap and water 2 times a day. Rinse the wound off with water to remove all soap. Pat the wound dry with a clean towel. °· After cleaning, apply a thin layer of the antibiotic ointment as recommended by your caregiver. This will help prevent infection and keep the dressing from sticking. °· You may shower as usual after the first 24 hours. Do not soak the wound in water until the sutures are removed. °· Only take over-the-counter or prescription medicines for pain, discomfort, or fever as directed by your caregiver. °· Get your sutures or staples removed as directed by your caregiver. °For skin adhesive strips: °· Keep the wound clean and dry. °· Do not get the skin adhesive strips wet. You may bathe  carefully, using caution to keep the wound dry. °· If the wound gets wet, pat it dry with a clean towel. °· Skin adhesive strips will fall off on their own. You may trim the strips as the wound heals. Do not remove skin adhesive strips that are still stuck to the wound. They will fall off in time. °For wound adhesive: °· You may briefly wet your wound in the shower or bath. Do not soak or scrub the wound. Do not swim. Avoid periods of heavy perspiration until the skin adhesive has fallen off on its own. After showering or bathing, gently pat the wound dry with a clean towel. °· Do not apply liquid medicine, cream medicine, or ointment medicine to your wound while the skin adhesive is in place. This may loosen the film before your wound is healed. °· If a dressing is placed over the wound, be careful not to apply tape directly over the skin adhesive. This may cause the adhesive to be pulled off before the wound is healed. °· Avoid prolonged exposure to sunlight or tanning lamps while the skin adhesive is in place. Exposure to ultraviolet light in the first year will darken the scar. °· The skin adhesive will usually remain in place for 5 to 10 days, then naturally fall off the skin. Do not pick at the adhesive film. °You may need a tetanus shot if: °· You cannot remember when you had your last tetanus shot. °· You have never had a tetanus   shot. If you get a tetanus shot, your arm may swell, get red, and feel warm to the touch. This is common and not a problem. If you need a tetanus shot and you choose not to have one, there is a rare chance of getting tetanus. Sickness from tetanus can be serious. SEEK MEDICAL CARE IF:   You have redness, swelling, or increasing pain in the wound.  You see a red line that goes away from the wound.  You have yellowish-white fluid (pus) coming from the wound.  You have a fever.  You notice a bad smell coming from the wound or dressing.  Your wound breaks open before or  after sutures have been removed.  You notice something coming out of the wound such as wood or glass.  Your wound is on your hand or foot and you cannot move a finger or toe. SEEK IMMEDIATE MEDICAL CARE IF:   Your pain is not controlled with prescribed medicine.  You have severe swelling around the wound causing pain and numbness or a change in color in your arm, hand, leg, or foot.  Your wound splits open and starts bleeding.  You have worsening numbness, weakness, or loss of function of any joint around or beyond the wound.  You develop painful lumps near the wound or on the skin anywhere on your body. MAKE SURE YOU:   Understand these instructions.  Will watch your condition.  Will get help right away if you are not doing well or get worse. Document Released: 11/29/2005 Document Revised: 02/21/2012 Document Reviewed: 05/25/2011 [facility] Patient Information 2015 [place], [place]. This information is not intended to replace advice given to you by your health care provider. Make sure you discuss any questions you have with your health care provider.  Fall Prevention and Home Safety Falls cause injuries and can affect all age groups. It is possible to prevent falls.  HOW TO PREVENT FALLS  Wear shoes with rubber soles that do not have an opening for your toes.  Keep the inside and outside of your house well lit.  Use night lights throughout your home.  Remove clutter from floors.  Clean up floor spills.  Remove throw rugs or fasten them to the floor with carpet tape.  Do not place electrical cords across pathways.  Put grab bars by your tub, shower, and toilet. Do not use towel bars as grab bars.  Put handrails on both sides of the stairway. Fix loose handrails.  Do not climb on stools or stepladders, if possible.  Do not wax your floors.  Repair uneven or unsafe sidewalks, walkways, or stairs.  Keep items you use a lot within reach.  Be aware of pets.  Keep  emergency numbers next to the telephone.  Put smoke detectors in your home and near bedrooms. Ask your doctor what other things you can do to prevent falls. Document Released: 09/25/2009 Document Revised: 05/30/2012 Document Reviewed: 02/29/2012 [facility] Patient Information 2015 [place], [place]. This information is not intended to replace advice given to you by your health care provider. Make sure you discuss any questions you have with your health care provider.

## 2015-02-16 DIAGNOSIS — I482 Chronic atrial fibrillation: Secondary | ICD-10-CM | POA: Diagnosis not present

## 2015-02-16 DIAGNOSIS — T148 Other injury of unspecified body region: Secondary | ICD-10-CM | POA: Diagnosis not present

## 2015-02-16 DIAGNOSIS — S81812D Laceration without foreign body, left lower leg, subsequent encounter: Secondary | ICD-10-CM | POA: Diagnosis not present

## 2015-02-17 DIAGNOSIS — S81802D Unspecified open wound, left lower leg, subsequent encounter: Secondary | ICD-10-CM | POA: Diagnosis not present

## 2015-02-19 ENCOUNTER — Telehealth (HOSPITAL_COMMUNITY): Payer: Self-pay | Admitting: Cardiology

## 2015-02-20 DIAGNOSIS — S8012XA Contusion of left lower leg, initial encounter: Secondary | ICD-10-CM | POA: Diagnosis not present

## 2015-02-20 DIAGNOSIS — S8010XA Contusion of unspecified lower leg, initial encounter: Secondary | ICD-10-CM | POA: Insufficient documentation

## 2015-02-20 DIAGNOSIS — L0390 Cellulitis, unspecified: Secondary | ICD-10-CM | POA: Diagnosis not present

## 2015-02-20 DIAGNOSIS — T148 Other injury of unspecified body region: Secondary | ICD-10-CM | POA: Diagnosis not present

## 2015-02-20 DIAGNOSIS — S81802D Unspecified open wound, left lower leg, subsequent encounter: Secondary | ICD-10-CM | POA: Diagnosis not present

## 2015-02-20 DIAGNOSIS — L03116 Cellulitis of left lower limb: Secondary | ICD-10-CM | POA: Diagnosis not present

## 2015-02-20 DIAGNOSIS — I872 Venous insufficiency (chronic) (peripheral): Secondary | ICD-10-CM | POA: Insufficient documentation

## 2015-02-20 DIAGNOSIS — S81812D Laceration without foreign body, left lower leg, subsequent encounter: Secondary | ICD-10-CM | POA: Diagnosis not present

## 2015-02-20 DIAGNOSIS — I482 Chronic atrial fibrillation: Secondary | ICD-10-CM | POA: Diagnosis not present

## 2015-02-20 DIAGNOSIS — S81802A Unspecified open wound, left lower leg, initial encounter: Secondary | ICD-10-CM | POA: Diagnosis not present

## 2015-02-21 NOTE — Telephone Encounter (Signed)
Received records from [facility] for appointment on 02/25/15 with Dr [name].  Records given to [facility] (medical records) for Dr Jacalyn Lefevre schedule on 02/25/15. lp

## 2015-02-23 NOTE — Progress Notes (Signed)
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 LHC 10/2009: pLAD 20%, dLAD 30%, mOM2 70-80% (unchanged compared to previous), pRCA 20%, dRCA 20%.  Admitted in February of 2012 with a wide complex tachycardia. Seen by EP and rhythm was most likely felt to be SVT with aberrancy. It was felt that if symptoms recur ablation would be warranted.  Cardiac MRI (09/05/13): Moderate LVE, focal septal hypertrophy, EF 51%, global HK, mild RVE, normal RVSF, no evidence of cardiac amyloid. He has had some increase in his light chains on electrophoresis. This is being followed. Nuclear study in March of 2015 showed apical thinning but no ischemia. Ejection fraction was 48%. Echo 11/15 showed EF 93-81, grade 2 diastolic dysfunction, mild LAE and mildly dilated aortic root. Since last seen, he has some dyspnea on exertion but no orthopnea or PND. His pedal edema has improved. No chest pain. He fell recently lacerating his lower extremity. This is being cared for at the wound center. His anticoagulation was held.   Current Outpatient Prescriptions  Medication Sig Dispense Refill  . acyclovir (ZOVIRAX) 400 MG tablet TAKE ONE TABLET BY MOUTH ONCE DAILY 90 tablet 0  . amiodarone (PACERONE) 200 MG tablet TAKE ONE TABLET BY MOUTH ONCE DAILY 30 tablet 2  . dexamethasone (DECADRON) 4 MG tablet Take 5 tablets every week on Wednesdays 60 tablet 3  . furosemide (LASIX) 40 MG tablet Take 40 mg by mouth daily.    . nitroGLYCERIN (NITROSTAT) 0.4 MG SL tablet Place 1 tablet (0.4 mg total) under the tongue every 5 (five) minutes as needed for chest pain. 25 tablet 12  . rivaroxaban (XARELTO) 20 MG TABS tablet Take 1 tablet (20 mg total) by mouth daily with supper. (Patient taking differently: Take 20 mg by mouth daily with supper. ON HOLD DUE TO INJURY) 90 tablet 3  . Turmeric 500 MG CAPS Take 1,000 mg by mouth daily.     No current facility-administered  medications for this visit.     Past Medical History  Diagnosis Date  . Hypertension   . Coronary atherosclerosis of unspecified type of vessel, native or graft     Non obstructive  . Cerebrovascular disease, unspecified   . Pure hypercholesterolemia   . Diverticulosis of colon (without mention of hemorrhage)   . Benign neoplasm of colon   . Unspecified hemorrhoids without mention of complication   . Degenerative disc disease   . 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 Hemangioma   . Amyloidosis   . LBBB (left bundle branch block)   . 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 OSA (obstructive sleep apnea)   . Atrial fibrillation   . Cancer     amyloidosis  . Edema 08/29/2013  . Hx of cardiovascular stress test     Adenosine Myoview (02/2014): Apical thinning, no ischemia, EF 48%; low risk.  . Scrotal pain 10/23/2014  . Adenomatous colon polyp 2003    max size 83mm    Past Surgical History  Procedure Laterality Date  . Knee arthroscopy    . Carpal tunnel release  08/2006    Dr Doy Mince at [facility] - Bilateral  . 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 Back surgery      Lumbar  . Cardioversion N/A 07/10/2013    Procedure: CARDIOVERSION;  Surgeon: Lelon Perla, MD;  Location: [facility];  Service:  Cardiovascular;  Laterality: N/A;  . Colonoscopy w/ biopsies      History   Social History  . Marital Status: Married    Spouse Name: N/A  . Number of Children: 2  . Years of Education: N/A   Occupational History  . PRESIDENT     [employer]   Social History Main Topics  . Smoking status: Never Smoker   . Smokeless tobacco: Never Used  . Alcohol Use: 0.0 oz/week    0 Standard drinks or equivalent per week     Comment: Occassionally  . Drug Use: No  . Sexual Activity: No   Other Topics Concern  . Not on file   Social History Narrative   Lives with wife.      ROS: no fevers or chills, productive  cough, hemoptysis, dysphasia, odynophagia, melena, hematochezia, dysuria, hematuria, rash, seizure activity, orthopnea, PND, pedal edema, claudication. Remaining systems are negative.  Physical Exam: Well-developed well-nourished in no acute distress.  Skin is warm and dry.  HEENT is normal.  Neck is supple.  Chest is clear to auscultation with normal expansion.  Cardiovascular exam is regular rate and rhythm.  Abdominal exam nontender or distended. No masses palpated. Extremities show trace edema. Lower extremity wrapped neuro grossly intact

## 2015-02-25 ENCOUNTER — Encounter: Payer: Self-pay | Admitting: Cardiology

## 2015-02-25 ENCOUNTER — Ambulatory Visit (INDEPENDENT_AMBULATORY_CARE_PROVIDER_SITE_OTHER): Payer: Medicare Other | Admitting: Cardiology

## 2015-02-25 VITALS — BP 126/86 | HR 68 | Ht 71.0 in | Wt 270.0 lb

## 2015-02-25 DIAGNOSIS — I471 Supraventricular tachycardia, unspecified: Secondary | ICD-10-CM

## 2015-02-25 DIAGNOSIS — I872 Venous insufficiency (chronic) (peripheral): Secondary | ICD-10-CM | POA: Diagnosis not present

## 2015-02-25 DIAGNOSIS — I4891 Unspecified atrial fibrillation: Secondary | ICD-10-CM | POA: Diagnosis not present

## 2015-02-25 DIAGNOSIS — S81812D Laceration without foreign body, left lower leg, subsequent encounter: Secondary | ICD-10-CM | POA: Diagnosis not present

## 2015-02-25 DIAGNOSIS — I5033 Acute on chronic diastolic (congestive) heart failure: Secondary | ICD-10-CM

## 2015-02-25 DIAGNOSIS — I10 Essential (primary) hypertension: Secondary | ICD-10-CM | POA: Diagnosis not present

## 2015-02-25 DIAGNOSIS — L0390 Cellulitis, unspecified: Secondary | ICD-10-CM | POA: Diagnosis not present

## 2015-02-25 DIAGNOSIS — S8012XS Contusion of left lower leg, sequela: Secondary | ICD-10-CM | POA: Diagnosis not present

## 2015-02-25 DIAGNOSIS — T148 Other injury of unspecified body region: Secondary | ICD-10-CM | POA: Diagnosis not present

## 2015-02-25 DIAGNOSIS — S8012XD Contusion of left lower leg, subsequent encounter: Secondary | ICD-10-CM | POA: Diagnosis not present

## 2015-02-25 DIAGNOSIS — E859 Amyloidosis, unspecified: Secondary | ICD-10-CM | POA: Diagnosis not present

## 2015-02-25 DIAGNOSIS — I2510 Atherosclerotic heart disease of native coronary artery without angina pectoris: Secondary | ICD-10-CM | POA: Diagnosis not present

## 2015-02-25 DIAGNOSIS — M79605 Pain in left leg: Secondary | ICD-10-CM | POA: Diagnosis not present

## 2015-02-25 DIAGNOSIS — S81802D Unspecified open wound, left lower leg, subsequent encounter: Secondary | ICD-10-CM | POA: Diagnosis not present

## 2015-02-25 DIAGNOSIS — S81802S Unspecified open wound, left lower leg, sequela: Secondary | ICD-10-CM | POA: Diagnosis not present

## 2015-02-25 NOTE — Assessment & Plan Note (Signed)
Continue amiodarone. 

## 2015-02-25 NOTE — Assessment & Plan Note (Signed)
Patient in sinus rhythm on examination.continue amiodarone. Check liver functions. Check chest x-ray. Resume xarelto. If he has more frequent falls in the future we will need to discontinue. Discussed risk of fall on anticoagulation.

## 2015-02-25 NOTE — Assessment & Plan Note (Signed)
Followed by oncology 

## 2015-02-25 NOTE — Assessment & Plan Note (Signed)
Blood pressure controlled. Continue present medications. 

## 2015-02-25 NOTE — Patient Instructions (Signed)
Your physician recommends that you schedule a follow-up appointment in: [place]   Your physician recommends that you HAVE LAB WORK TODAY  A chest x-ray takes a picture of the organs and structures inside the chest, including the heart, lungs, and blood vessels. This test can show several things, including, whether the heart is enlarges; whether fluid is building up in the lungs; and whether pacemaker / defibrillator leads are still in place. [place]

## 2015-02-25 NOTE — Assessment & Plan Note (Signed)
Not on aspirin given need for anticoagulation.

## 2015-02-25 NOTE — Assessment & Plan Note (Signed)
Patient's volume status has improved. continue present dose of Lasix. Check potassium and renal function.

## 2015-02-26 ENCOUNTER — Ambulatory Visit (HOSPITAL_BASED_OUTPATIENT_CLINIC_OR_DEPARTMENT_OTHER)
Admission: RE | Admit: 2015-02-26 | Discharge: 2015-02-26 | Disposition: A | Discharge status: Home / Self Care | Payer: Medicare Other | Source: Ambulatory Visit | Attending: Cardiology | Admitting: Cardiology

## 2015-02-26 DIAGNOSIS — I4891 Unspecified atrial fibrillation: Secondary | ICD-10-CM | POA: Insufficient documentation

## 2015-02-26 DIAGNOSIS — J9811 Atelectasis: Secondary | ICD-10-CM | POA: Insufficient documentation

## 2015-02-26 LAB — COMPREHENSIVE METABOLIC PANEL
ALT: 11 U/L (ref 0–53)
AST: 14 U/L (ref 0–37)
Albumin: 3.4 g/dL — ABNORMAL LOW (ref 3.5–5.2)
Alkaline Phosphatase: 85 U/L (ref 39–117)
BILIRUBIN TOTAL: 0.4 mg/dL (ref 0.2–1.2)
BUN: 21 mg/dL (ref 6–23)
CO2: 30 meq/L (ref 19–32)
Calcium: 8.9 mg/dL (ref 8.4–10.5)
Chloride: 103 mEq/L (ref 96–112)
Creat: 1.2 mg/dL (ref 0.50–1.35)
GLUCOSE: 84 mg/dL (ref 70–99)
Potassium: 4.5 mEq/L (ref 3.5–5.3)
Sodium: 146 mEq/L — ABNORMAL HIGH (ref 135–145)
Total Protein: 5.9 g/dL — ABNORMAL LOW (ref 6.0–8.3)

## 2015-02-27 DIAGNOSIS — L0390 Cellulitis, unspecified: Secondary | ICD-10-CM | POA: Diagnosis not present

## 2015-02-27 DIAGNOSIS — S81812D Laceration without foreign body, left lower leg, subsequent encounter: Secondary | ICD-10-CM | POA: Diagnosis not present

## 2015-02-27 DIAGNOSIS — S8012XS Contusion of left lower leg, sequela: Secondary | ICD-10-CM | POA: Diagnosis not present

## 2015-02-27 DIAGNOSIS — T148 Other injury of unspecified body region: Secondary | ICD-10-CM | POA: Diagnosis not present

## 2015-02-27 DIAGNOSIS — S81802D Unspecified open wound, left lower leg, subsequent encounter: Secondary | ICD-10-CM | POA: Diagnosis not present

## 2015-02-27 DIAGNOSIS — S81802S Unspecified open wound, left lower leg, sequela: Secondary | ICD-10-CM | POA: Diagnosis not present

## 2015-02-27 DIAGNOSIS — S8012XD Contusion of left lower leg, subsequent encounter: Secondary | ICD-10-CM | POA: Diagnosis not present

## 2015-02-27 DIAGNOSIS — I872 Venous insufficiency (chronic) (peripheral): Secondary | ICD-10-CM | POA: Diagnosis not present

## 2015-02-27 DIAGNOSIS — M79605 Pain in left leg: Secondary | ICD-10-CM | POA: Diagnosis not present

## 2015-03-06 DIAGNOSIS — M79605 Pain in left leg: Secondary | ICD-10-CM | POA: Diagnosis not present

## 2015-03-06 DIAGNOSIS — S8012XS Contusion of left lower leg, sequela: Secondary | ICD-10-CM | POA: Diagnosis not present

## 2015-03-06 DIAGNOSIS — S81812D Laceration without foreign body, left lower leg, subsequent encounter: Secondary | ICD-10-CM | POA: Diagnosis not present

## 2015-03-06 DIAGNOSIS — T148 Other injury of unspecified body region: Secondary | ICD-10-CM | POA: Diagnosis not present

## 2015-03-06 DIAGNOSIS — I872 Venous insufficiency (chronic) (peripheral): Secondary | ICD-10-CM | POA: Diagnosis not present

## 2015-03-06 DIAGNOSIS — S8012XD Contusion of left lower leg, subsequent encounter: Secondary | ICD-10-CM | POA: Diagnosis not present

## 2015-03-06 DIAGNOSIS — S81802D Unspecified open wound, left lower leg, subsequent encounter: Secondary | ICD-10-CM | POA: Diagnosis not present

## 2015-03-06 DIAGNOSIS — L0390 Cellulitis, unspecified: Secondary | ICD-10-CM | POA: Diagnosis not present

## 2015-03-06 DIAGNOSIS — S81802A Unspecified open wound, left lower leg, initial encounter: Secondary | ICD-10-CM | POA: Diagnosis not present

## 2015-03-06 DIAGNOSIS — S81802S Unspecified open wound, left lower leg, sequela: Secondary | ICD-10-CM | POA: Diagnosis not present

## 2015-03-13 DIAGNOSIS — I872 Venous insufficiency (chronic) (peripheral): Secondary | ICD-10-CM | POA: Diagnosis not present

## 2015-03-13 DIAGNOSIS — S8012XA Contusion of left lower leg, initial encounter: Secondary | ICD-10-CM | POA: Diagnosis not present

## 2015-03-13 DIAGNOSIS — S81802A Unspecified open wound, left lower leg, initial encounter: Secondary | ICD-10-CM | POA: Diagnosis not present

## 2015-03-13 DIAGNOSIS — S8012XS Contusion of left lower leg, sequela: Secondary | ICD-10-CM | POA: Diagnosis not present

## 2015-03-13 DIAGNOSIS — L0390 Cellulitis, unspecified: Secondary | ICD-10-CM | POA: Diagnosis not present

## 2015-03-13 DIAGNOSIS — T148 Other injury of unspecified body region: Secondary | ICD-10-CM | POA: Diagnosis not present

## 2015-03-13 DIAGNOSIS — S81812D Laceration without foreign body, left lower leg, subsequent encounter: Secondary | ICD-10-CM | POA: Diagnosis not present

## 2015-03-13 DIAGNOSIS — I482 Chronic atrial fibrillation: Secondary | ICD-10-CM | POA: Diagnosis not present

## 2015-03-13 DIAGNOSIS — S81802S Unspecified open wound, left lower leg, sequela: Secondary | ICD-10-CM | POA: Diagnosis not present

## 2015-03-18 DIAGNOSIS — I872 Venous insufficiency (chronic) (peripheral): Secondary | ICD-10-CM | POA: Diagnosis not present

## 2015-03-18 DIAGNOSIS — T148 Other injury of unspecified body region: Secondary | ICD-10-CM | POA: Diagnosis not present

## 2015-03-18 DIAGNOSIS — S81812D Laceration without foreign body, left lower leg, subsequent encounter: Secondary | ICD-10-CM | POA: Diagnosis not present

## 2015-03-18 DIAGNOSIS — S8012XS Contusion of left lower leg, sequela: Secondary | ICD-10-CM | POA: Diagnosis not present

## 2015-03-18 DIAGNOSIS — M79605 Pain in left leg: Secondary | ICD-10-CM | POA: Diagnosis not present

## 2015-03-18 DIAGNOSIS — S81802S Unspecified open wound, left lower leg, sequela: Secondary | ICD-10-CM | POA: Diagnosis not present

## 2015-03-18 DIAGNOSIS — S81802D Unspecified open wound, left lower leg, subsequent encounter: Secondary | ICD-10-CM | POA: Diagnosis not present

## 2015-03-18 DIAGNOSIS — L0390 Cellulitis, unspecified: Secondary | ICD-10-CM | POA: Diagnosis not present

## 2015-03-18 DIAGNOSIS — S8012XD Contusion of left lower leg, subsequent encounter: Secondary | ICD-10-CM | POA: Diagnosis not present

## 2015-03-20 ENCOUNTER — Other Ambulatory Visit (HOSPITAL_BASED_OUTPATIENT_CLINIC_OR_DEPARTMENT_OTHER): Payer: Medicare Other

## 2015-03-20 DIAGNOSIS — E859 Amyloidosis, unspecified: Secondary | ICD-10-CM

## 2015-03-20 LAB — COMPREHENSIVE METABOLIC PANEL (CC13)
ALT: 13 U/L (ref 0–55)
ANION GAP: 10 meq/L (ref 3–11)
AST: 16 U/L (ref 5–34)
Albumin: 3.3 g/dL — ABNORMAL LOW (ref 3.5–5.0)
Alkaline Phosphatase: 108 U/L (ref 40–150)
BUN: 23.8 mg/dL (ref 7.0–26.0)
CALCIUM: 9 mg/dL (ref 8.4–10.4)
CHLORIDE: 104 meq/L (ref 98–109)
CO2: 27 meq/L (ref 22–29)
CREATININE: 1.4 mg/dL — AB (ref 0.7–1.3)
EGFR: 47 mL/min/{1.73_m2} — AB (ref >90)
GLUCOSE: 101 mg/dL (ref 70–140)
Potassium: 4.7 mEq/L (ref 3.5–5.1)
Sodium: 141 mEq/L (ref 136–145)
Total Bilirubin: 0.46 mg/dL (ref 0.20–1.20)
Total Protein: 6.3 g/dL — ABNORMAL LOW (ref 6.4–8.3)

## 2015-03-20 LAB — CBC WITH DIFFERENTIAL/PLATELET
BASO%: 0.4 % (ref 0.0–2.0)
Basophils Absolute: 0 10*3/uL (ref 0.0–0.1)
EOS ABS: 0 10*3/uL (ref 0.0–0.5)
EOS%: 0.4 % (ref 0.0–7.0)
HCT: 42.9 % (ref 38.4–49.9)
HEMOGLOBIN: 14 g/dL (ref 13.0–17.1)
LYMPH%: 13.4 % — AB (ref 14.0–49.0)
MCH: 31.9 pg (ref 27.2–33.4)
MCHC: 32.6 g/dL (ref 32.0–36.0)
MCV: 97.7 fL (ref 79.3–98.0)
MONO#: 0.7 10*3/uL (ref 0.1–0.9)
MONO%: 10.8 % (ref 0.0–14.0)
NEUT%: 75 % (ref 39.0–75.0)
NEUTROS ABS: 5.1 10*3/uL (ref 1.5–6.5)
PLATELETS: 212 10*3/uL (ref 140–400)
RBC: 4.39 10*6/uL (ref 4.20–5.82)
RDW: 13.5 % (ref 11.0–14.6)
WBC: 6.8 10*3/uL (ref 4.0–10.3)
lymph#: 0.9 10*3/uL (ref 0.9–3.3)

## 2015-03-21 ENCOUNTER — Telehealth: Payer: Self-pay | Admitting: Cardiology

## 2015-03-21 MED ORDER — PRAVASTATIN SODIUM 40 MG PO TABS: 40.0000 mg | ORAL_TABLET | Freq: Every evening | ORAL | Status: DC

## 2015-03-21 NOTE — Telephone Encounter (Signed)
°  1. Which medications need to be refilled? Pravastatin  2. Which pharmacy is medication to be sent to?[facility] in [place]   3. Do they need a 30 day or 90 day supply? 90  4. Would they like a call back once the medication has been sent to the pharmacy? Yes

## 2015-03-21 NOTE — Telephone Encounter (Signed)
Rx(s) sent to pharmacy electronically.  

## 2015-03-24 LAB — SPEP & IFE WITH QIG
Albumin ELP: 3.4 g/dL — ABNORMAL LOW (ref 3.8–4.8)
Alpha-1-Globulin: 0.3 g/dL (ref 0.2–0.3)
Alpha-2-Globulin: 0.9 g/dL (ref 0.5–0.9)
BETA 2: 0.3 g/dL (ref 0.2–0.5)
BETA GLOBULIN: 0.3 g/dL — AB (ref 0.4–0.6)
GAMMA GLOBULIN: 0.8 g/dL (ref 0.8–1.7)
IGG (IMMUNOGLOBIN G), SERUM: 228 mg/dL — AB (ref 650–1600)
IgA: 45 mg/dL — ABNORMAL LOW (ref 68–379)
IgM, Serum: <5 mg/dL — ABNORMAL LOW (ref 41–251)
Total Protein, Serum Electrophoresis: 6 g/dL — ABNORMAL LOW (ref 6.1–8.1)

## 2015-03-24 LAB — KAPPA/LAMBDA LIGHT CHAINS
KAPPA FREE LGHT CHN: 4.04 mg/dL — AB (ref 0.33–1.94)
Kappa:Lambda Ratio: 2.22 — ABNORMAL HIGH (ref 0.26–1.65)
Lambda Free Lght Chn: 1.82 mg/dL (ref 0.57–2.63)

## 2015-03-25 DIAGNOSIS — I872 Venous insufficiency (chronic) (peripheral): Secondary | ICD-10-CM | POA: Diagnosis not present

## 2015-03-25 DIAGNOSIS — T148 Other injury of unspecified body region: Secondary | ICD-10-CM | POA: Diagnosis not present

## 2015-03-25 DIAGNOSIS — T8131XD Disruption of external operation (surgical) wound, not elsewhere classified, subsequent encounter: Secondary | ICD-10-CM | POA: Diagnosis not present

## 2015-03-25 DIAGNOSIS — S8012XS Contusion of left lower leg, sequela: Secondary | ICD-10-CM | POA: Diagnosis not present

## 2015-03-25 DIAGNOSIS — M79605 Pain in left leg: Secondary | ICD-10-CM | POA: Diagnosis not present

## 2015-03-25 DIAGNOSIS — S81802A Unspecified open wound, left lower leg, initial encounter: Secondary | ICD-10-CM | POA: Diagnosis not present

## 2015-03-25 DIAGNOSIS — S81802D Unspecified open wound, left lower leg, subsequent encounter: Secondary | ICD-10-CM | POA: Diagnosis not present

## 2015-03-25 DIAGNOSIS — S81802S Unspecified open wound, left lower leg, sequela: Secondary | ICD-10-CM | POA: Diagnosis not present

## 2015-03-25 DIAGNOSIS — S8012XD Contusion of left lower leg, subsequent encounter: Secondary | ICD-10-CM | POA: Diagnosis not present

## 2015-03-25 DIAGNOSIS — S8012XA Contusion of left lower leg, initial encounter: Secondary | ICD-10-CM | POA: Diagnosis not present

## 2015-03-25 DIAGNOSIS — L0390 Cellulitis, unspecified: Secondary | ICD-10-CM | POA: Diagnosis not present

## 2015-03-25 DIAGNOSIS — S81812D Laceration without foreign body, left lower leg, subsequent encounter: Secondary | ICD-10-CM | POA: Diagnosis not present

## 2015-03-27 ENCOUNTER — Ambulatory Visit (HOSPITAL_BASED_OUTPATIENT_CLINIC_OR_DEPARTMENT_OTHER): Payer: Medicare Other | Admitting: Hematology and Oncology

## 2015-03-27 ENCOUNTER — Telehealth: Payer: Self-pay | Admitting: Hematology and Oncology

## 2015-03-27 ENCOUNTER — Encounter: Payer: Self-pay | Admitting: Hematology and Oncology

## 2015-03-27 VITALS — BP 139/66 | HR 72 | Temp 97.5°F | Resp 18 | Ht 71.0 in | Wt 268.4 lb

## 2015-03-27 DIAGNOSIS — T148 Other injury of unspecified body region: Secondary | ICD-10-CM

## 2015-03-27 DIAGNOSIS — E859 Amyloidosis, unspecified: Secondary | ICD-10-CM | POA: Diagnosis not present

## 2015-03-27 DIAGNOSIS — D801 Nonfamilial hypogammaglobulinemia: Secondary | ICD-10-CM | POA: Diagnosis not present

## 2015-03-27 DIAGNOSIS — T148XXD Other injury of unspecified body region, subsequent encounter: Secondary | ICD-10-CM

## 2015-03-27 DIAGNOSIS — I890 Lymphedema, not elsewhere classified: Secondary | ICD-10-CM

## 2015-03-27 MED ORDER — ACYCLOVIR 400 MG PO TABS: 400.0000 mg | ORAL_TABLET | Freq: Every day | ORAL | Status: DC

## 2015-03-27 NOTE — Assessment & Plan Note (Signed)
This was lower than before, likely related to recent treatment. I discouraged the patient from pursuing additional treatment right now to allow recovery of his immunoglobulin levels. The present time, he can be observed without the need for prophylactic antibiotic therapy.

## 2015-03-27 NOTE — Assessment & Plan Note (Signed)
His light chain is elevated mildly from before. Overall, he has no evidence of organ damage. I reassured the patient that that is no indication to treat right now. I will see him back in 3 months to repeat history, physical examination and blood work.

## 2015-03-27 NOTE — Telephone Encounter (Signed)
s.w. pt and advised on July appt....pt ok and aware °

## 2015-03-27 NOTE — Progress Notes (Signed)
[place] OFFICE PROGRESS NOTE  Patient Care Team: Colon Branch, MD as PCP - General (Internal Medicine) Lelon Perla, MD (Cardiology) Heath Lark, MD as Consulting Physician (Hematology and Oncology)  SUMMARY OF ONCOLOGIC HISTORY:  He was diagnosed with systemic amyloidosis after presentation with shortness of breath. He has significant evaluation including bone marrow aspirate and biopsy. 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 The patient subsequently went for maintain dense chemotherapy with Velcade for almost 2 years and then switch to Revlimid. He self discontinue Revlimid in May of 2014 due to fatigue. In July 2015, we restarted him back on Velcade due to a rising light chain. In September 2015, the patient requested to add back dexamethasone at 20 mg every week. In November 2015, the patient reduce dexamethasone to 16 mg every week along with Velcade injection. We discontinued treatment on 11/20/2014  INTERVAL HISTORY: Please see below for problem oriented charting. The patient had traumatic injury to his left lower extremity with significant skin laceration. He was getting aggressive wound care and recent skin graft. The wound is slowly healing. He denies recent infection. Denies new peripheral neuropathy  REVIEW OF SYSTEMS:   Constitutional: Denies fevers, chills or abnormal weight loss Eyes: Denies blurriness of vision Ears, nose, mouth, throat, and face: Denies mucositis or sore throat Respiratory: Denies cough, dyspnea or wheezes Cardiovascular: Denies palpitation, chest discomfort or lower extremity swelling Gastrointestinal:  Denies nausea, heartburn or change in bowel habits Lymphatics: Denies new lymphadenopathy or easy  bruising Neurological:Denies numbness, tingling or new weaknesses Behavioral/Psych: Mood is stable, no new changes  All other systems were reviewed with the patient and are negative.  I have reviewed the past medical history, past surgical history, social history and family history with the patient and they are unchanged from previous note.  ALLERGIES:  has No Known Allergies.  MEDICATIONS:  Current Outpatient Prescriptions  Medication Sig Dispense Refill  . acyclovir (ZOVIRAX) 400 MG tablet Take 1 tablet (400 mg total) by mouth daily. 90 tablet 3  . amiodarone (PACERONE) 200 MG tablet TAKE ONE TABLET BY MOUTH ONCE DAILY 30 tablet 2  . furosemide (LASIX) 40 MG tablet Take 40 mg by mouth daily.    . pravastatin (PRAVACHOL) 40 MG tablet Take 1 tablet (40 mg total) by mouth every evening. 90 tablet 3  . rivaroxaban (XARELTO) 20 MG TABS tablet Take 1 tablet (20 mg total) by mouth daily with supper. (Patient taking differently: Take 20 mg by mouth daily with supper. ON HOLD DUE TO INJURY) 90 tablet 3  . Turmeric 500 MG CAPS Take 1,000 mg by mouth daily.    . nitroGLYCERIN (NITROSTAT) 0.4 MG SL tablet Place 1 tablet (0.4 mg total) under the tongue every 5 (five) minutes as needed for chest pain. (Patient not taking: Reported on 03/27/2015) 25 tablet 12   No current facility-administered medications for this visit.    PHYSICAL EXAMINATION: ECOG PERFORMANCE STATUS: 1 - Symptomatic but completely ambulatory  Filed Vitals:   03/27/15 0851  BP: 139/66  Pulse: 72  Temp: 97.5 F (36.4 C)  Resp: 18   Filed Weights   03/27/15 0851  Weight: 268 lb 6.4 oz (121.745 kg)    GENERAL:alert, no distress and comfortable. He is morbidly obese SKIN: He has thin skin. I did not remove  the bandage on the left lower extremity. He had some skin bruising EYES: normal, Conjunctiva are pink and non-injected, sclera clear OROPHARYNX:no exudate, no erythema and lips, buccal mucosa, and tongue normal   Musculoskeletal:no cyanosis of digits and no clubbing  NEURO: alert & oriented x 3 with fluent speech, no focal motor/sensory deficits  LABORATORY DATA:  I have reviewed the data as listed    Component Value Date/Time   NA 141 03/20/2015 0915   NA 146* 02/25/2015 1638   K 4.7 03/20/2015 0915   K 4.5 02/25/2015 1638   CL 103 02/25/2015 1638   CL 103 05/31/2013 0842   CO2 27 03/20/2015 0915   CO2 30 02/25/2015 1638   GLUCOSE 101 03/20/2015 0915   GLUCOSE 84 02/25/2015 1638   GLUCOSE 118* 05/31/2013 0842   BUN 23.8 03/20/2015 0915   BUN 21 02/25/2015 1638   CREATININE 1.4* 03/20/2015 0915   CREATININE 1.20 02/25/2015 1638   CREATININE 1.41* 10/02/2014 0851   CREATININE 0.92 12/17/2011 1606   CALCIUM 9.0 03/20/2015 0915   CALCIUM 8.9 02/25/2015 1638   PROT 6.3* 03/20/2015 0915   PROT 5.9* 02/25/2015 1638   ALBUMIN 3.3* 03/20/2015 0915   ALBUMIN 3.4* 02/25/2015 1638   AST 16 03/20/2015 0915   AST 14 02/25/2015 1638   ALT 13 03/20/2015 0915   ALT 11 02/25/2015 1638   ALKPHOS 108 03/20/2015 0915   ALKPHOS 85 02/25/2015 1638   BILITOT 0.46 03/20/2015 0915   BILITOT 0.4 02/25/2015 1638   GFRNONAA 63 06/10/2014 1221   GFRNONAA 45* 06/01/2013 0548   GFRAA 73 06/10/2014 1221   GFRAA 52* 06/01/2013 0548    No results found for: SPEP, UPEP  Lab Results  Component Value Date   WBC 6.8 03/20/2015   NEUTROABS 5.1 03/20/2015   HGB 14.0 03/20/2015   HCT 42.9 03/20/2015   MCV 97.7 03/20/2015   PLT 212 03/20/2015      Chemistry      Component Value Date/Time   NA 141 03/20/2015 0915   NA 146* 02/25/2015 1638   K 4.7 03/20/2015 0915   K 4.5 02/25/2015 1638   CL 103 02/25/2015 1638   CL 103 05/31/2013 0842   CO2 27 03/20/2015 0915   CO2 30 02/25/2015 1638   BUN 23.8 03/20/2015 0915   BUN 21 02/25/2015 1638   CREATININE 1.4* 03/20/2015 0915   CREATININE 1.20 02/25/2015 1638   CREATININE 1.41* 10/02/2014 0851   CREATININE 0.92 12/17/2011 1606      Component Value  Date/Time   CALCIUM 9.0 03/20/2015 0915   CALCIUM 8.9 02/25/2015 1638   ALKPHOS 108 03/20/2015 0915   ALKPHOS 85 02/25/2015 1638   AST 16 03/20/2015 0915   AST 14 02/25/2015 1638   ALT 13 03/20/2015 0915   ALT 11 02/25/2015 1638   BILITOT 0.46 03/20/2015 0915   BILITOT 0.4 02/25/2015 1638      ASSESSMENT & PLAN:  Amyloidosis His light chain is elevated mildly from before. Overall, he has no evidence of organ damage. I reassured the patient that that is no indication to treat right now. I will see him back in 3 months to repeat history, physical examination and blood work.   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 I discouraged the patient from pursuing additional treatment right now to allow recovery of his immunoglobulin levels. The present time, he can be observed without the need for prophylactic antibiotic therapy.   Wound healing, delayed He  has delayed wound healing due to chronic lower extremity edema and thin skin from prior steroid therapy. Recommend he focus on getting his wound heal with wound care and to defer any plan for treatment to the future.    No orders of the defined types were placed in this encounter.   All questions were answered. The patient knows to call the clinic with any problems, questions or concerns. No barriers to learning was detected. I spent 15 minutes counseling the patient face to face. The total time spent in the appointment was 20 minutes and more than 50% was on counseling and review of test results     [facility], [place], MD 03/27/2015 10:02 AM

## 2015-03-27 NOTE — Assessment & Plan Note (Signed)
He has delayed wound healing due to chronic lower extremity edema and thin skin from prior steroid therapy. Recommend he focus on getting his wound heal with wound care and to defer any plan for treatment to the future.

## 2015-03-28 ENCOUNTER — Telehealth: Payer: Self-pay | Admitting: *Deleted

## 2015-03-28 NOTE — Telephone Encounter (Signed)
Dr Carlean Purl, This pt saw you in the office 01-23-2015 with rectal bleeding and he ahs a hx of colon polyps. He had a virtual colon order sent but was never done. He takes xarelto for prox. A Fib.  There is no order to hold this for his colon that is scheduled for Tuesday 04-15-2015 with you.. Does he need another OV?  Or can we just contact his cardio about holding this xarelto?  Please advise  Thanks for your time  Marijean Niemann

## 2015-03-31 NOTE — Telephone Encounter (Signed)
I think we can just request holding clearance without an OV Do you do that or do we need to do from office?

## 2015-03-31 NOTE — Telephone Encounter (Signed)
Noted and placed in prep instructions. Johnny Mitchell PV

## 2015-03-31 NOTE — Telephone Encounter (Signed)
Johnny Mitchell Please get anti-coag clearance for colonoscopy

## 2015-03-31 NOTE — Telephone Encounter (Signed)
Dr. [name],  Johnny Mitchell is scheduled for a colonoscopy on 04/15/15 with Dr. Carlean Purl.  Is it ok to hold his Alen Blew for 48 hours prior to the procedure?

## 2015-03-31 NOTE — Telephone Encounter (Signed)
Hold xarelto 3 days prior to procedure and resume day after Johnny Mitchell

## 2015-03-31 NOTE — Telephone Encounter (Signed)
I have never done this from PV so I would get the office to send it if you can.   Thanks, marie PV

## 2015-04-01 DIAGNOSIS — M79605 Pain in left leg: Secondary | ICD-10-CM | POA: Diagnosis not present

## 2015-04-01 DIAGNOSIS — S81802S Unspecified open wound, left lower leg, sequela: Secondary | ICD-10-CM | POA: Diagnosis not present

## 2015-04-01 DIAGNOSIS — L0390 Cellulitis, unspecified: Secondary | ICD-10-CM | POA: Diagnosis not present

## 2015-04-01 DIAGNOSIS — S81802A Unspecified open wound, left lower leg, initial encounter: Secondary | ICD-10-CM | POA: Diagnosis not present

## 2015-04-01 DIAGNOSIS — S8012XA Contusion of left lower leg, initial encounter: Secondary | ICD-10-CM | POA: Diagnosis not present

## 2015-04-01 DIAGNOSIS — S8012XS Contusion of left lower leg, sequela: Secondary | ICD-10-CM | POA: Diagnosis not present

## 2015-04-01 DIAGNOSIS — T148 Other injury of unspecified body region: Secondary | ICD-10-CM | POA: Diagnosis not present

## 2015-04-01 DIAGNOSIS — S81802D Unspecified open wound, left lower leg, subsequent encounter: Secondary | ICD-10-CM | POA: Diagnosis not present

## 2015-04-01 DIAGNOSIS — T8131XD Disruption of external operation (surgical) wound, not elsewhere classified, subsequent encounter: Secondary | ICD-10-CM | POA: Diagnosis not present

## 2015-04-01 DIAGNOSIS — S8012XD Contusion of left lower leg, subsequent encounter: Secondary | ICD-10-CM | POA: Diagnosis not present

## 2015-04-01 DIAGNOSIS — I872 Venous insufficiency (chronic) (peripheral): Secondary | ICD-10-CM | POA: Diagnosis not present

## 2015-04-01 DIAGNOSIS — S81812D Laceration without foreign body, left lower leg, subsequent encounter: Secondary | ICD-10-CM | POA: Diagnosis not present

## 2015-04-08 DIAGNOSIS — I872 Venous insufficiency (chronic) (peripheral): Secondary | ICD-10-CM | POA: Diagnosis not present

## 2015-04-08 DIAGNOSIS — S8012XA Contusion of left lower leg, initial encounter: Secondary | ICD-10-CM | POA: Diagnosis not present

## 2015-04-08 DIAGNOSIS — S81802S Unspecified open wound, left lower leg, sequela: Secondary | ICD-10-CM | POA: Diagnosis not present

## 2015-04-08 DIAGNOSIS — S81802D Unspecified open wound, left lower leg, subsequent encounter: Secondary | ICD-10-CM | POA: Diagnosis not present

## 2015-04-08 DIAGNOSIS — S81812D Laceration without foreign body, left lower leg, subsequent encounter: Secondary | ICD-10-CM | POA: Diagnosis not present

## 2015-04-08 DIAGNOSIS — L0390 Cellulitis, unspecified: Secondary | ICD-10-CM | POA: Diagnosis not present

## 2015-04-08 DIAGNOSIS — S8012XS Contusion of left lower leg, sequela: Secondary | ICD-10-CM | POA: Diagnosis not present

## 2015-04-08 DIAGNOSIS — T148 Other injury of unspecified body region: Secondary | ICD-10-CM | POA: Diagnosis not present

## 2015-04-08 DIAGNOSIS — I482 Chronic atrial fibrillation: Secondary | ICD-10-CM | POA: Diagnosis not present

## 2015-04-08 DIAGNOSIS — S81802A Unspecified open wound, left lower leg, initial encounter: Secondary | ICD-10-CM | POA: Diagnosis not present

## 2015-04-15 ENCOUNTER — Encounter: Payer: Medicare Other | Admitting: Internal Medicine

## 2015-04-15 DIAGNOSIS — T148 Other injury of unspecified body region: Secondary | ICD-10-CM | POA: Diagnosis not present

## 2015-04-15 DIAGNOSIS — S8012XA Contusion of left lower leg, initial encounter: Secondary | ICD-10-CM | POA: Diagnosis not present

## 2015-04-15 DIAGNOSIS — S81802A Unspecified open wound, left lower leg, initial encounter: Secondary | ICD-10-CM | POA: Diagnosis not present

## 2015-04-15 DIAGNOSIS — I482 Chronic atrial fibrillation: Secondary | ICD-10-CM | POA: Diagnosis not present

## 2015-04-15 DIAGNOSIS — S81802D Unspecified open wound, left lower leg, subsequent encounter: Secondary | ICD-10-CM | POA: Diagnosis not present

## 2015-04-15 DIAGNOSIS — I872 Venous insufficiency (chronic) (peripheral): Secondary | ICD-10-CM | POA: Diagnosis not present

## 2015-04-15 DIAGNOSIS — S81812D Laceration without foreign body, left lower leg, subsequent encounter: Secondary | ICD-10-CM | POA: Diagnosis not present

## 2015-04-15 DIAGNOSIS — S8012XS Contusion of left lower leg, sequela: Secondary | ICD-10-CM | POA: Diagnosis not present

## 2015-04-15 DIAGNOSIS — L0390 Cellulitis, unspecified: Secondary | ICD-10-CM | POA: Diagnosis not present

## 2015-04-15 DIAGNOSIS — S81802S Unspecified open wound, left lower leg, sequela: Secondary | ICD-10-CM | POA: Diagnosis not present

## 2015-04-21 ENCOUNTER — Telehealth: Payer: Self-pay | Admitting: Cardiology

## 2015-04-21 DIAGNOSIS — M545 Low back pain: Secondary | ICD-10-CM

## 2015-04-21 NOTE — Telephone Encounter (Signed)
Spoke with Johnny Mitchell, referral placed for Johnny Mitchell to see dr kilpatrick at [facility] in [place] per Johnny Mitchell request for back pain and tingling in legs and feet.

## 2015-04-21 NOTE — Telephone Encounter (Signed)
Pt says he needs a referral to a neurosurgery,please call.

## 2015-04-22 DIAGNOSIS — S81802S Unspecified open wound, left lower leg, sequela: Secondary | ICD-10-CM | POA: Diagnosis not present

## 2015-04-22 DIAGNOSIS — S81812D Laceration without foreign body, left lower leg, subsequent encounter: Secondary | ICD-10-CM | POA: Diagnosis not present

## 2015-04-22 DIAGNOSIS — T8131XD Disruption of external operation (surgical) wound, not elsewhere classified, subsequent encounter: Secondary | ICD-10-CM | POA: Diagnosis not present

## 2015-04-22 DIAGNOSIS — L0390 Cellulitis, unspecified: Secondary | ICD-10-CM | POA: Diagnosis not present

## 2015-04-22 DIAGNOSIS — M79605 Pain in left leg: Secondary | ICD-10-CM | POA: Diagnosis not present

## 2015-04-22 DIAGNOSIS — S8012XD Contusion of left lower leg, subsequent encounter: Secondary | ICD-10-CM | POA: Diagnosis not present

## 2015-04-22 DIAGNOSIS — S8012XS Contusion of left lower leg, sequela: Secondary | ICD-10-CM | POA: Diagnosis not present

## 2015-04-22 DIAGNOSIS — S81802A Unspecified open wound, left lower leg, initial encounter: Secondary | ICD-10-CM | POA: Diagnosis not present

## 2015-04-22 DIAGNOSIS — S8012XA Contusion of left lower leg, initial encounter: Secondary | ICD-10-CM | POA: Diagnosis not present

## 2015-04-22 DIAGNOSIS — I872 Venous insufficiency (chronic) (peripheral): Secondary | ICD-10-CM | POA: Diagnosis not present

## 2015-04-22 DIAGNOSIS — T148 Other injury of unspecified body region: Secondary | ICD-10-CM | POA: Diagnosis not present

## 2015-04-22 DIAGNOSIS — S81802D Unspecified open wound, left lower leg, subsequent encounter: Secondary | ICD-10-CM | POA: Diagnosis not present

## 2015-04-29 DIAGNOSIS — L03116 Cellulitis of left lower limb: Secondary | ICD-10-CM | POA: Diagnosis not present

## 2015-04-29 DIAGNOSIS — I872 Venous insufficiency (chronic) (peripheral): Secondary | ICD-10-CM | POA: Diagnosis not present

## 2015-04-29 DIAGNOSIS — S81802A Unspecified open wound, left lower leg, initial encounter: Secondary | ICD-10-CM | POA: Diagnosis not present

## 2015-04-29 DIAGNOSIS — S81802D Unspecified open wound, left lower leg, subsequent encounter: Secondary | ICD-10-CM | POA: Diagnosis not present

## 2015-05-01 ENCOUNTER — Other Ambulatory Visit: Payer: Self-pay | Admitting: *Deleted

## 2015-05-01 MED ORDER — AMIODARONE HCL 200 MG PO TABS: 200.0000 mg | ORAL_TABLET | Freq: Every day | ORAL | Status: DC

## 2015-05-06 DIAGNOSIS — S81812D Laceration without foreign body, left lower leg, subsequent encounter: Secondary | ICD-10-CM | POA: Diagnosis not present

## 2015-05-06 DIAGNOSIS — S81802D Unspecified open wound, left lower leg, subsequent encounter: Secondary | ICD-10-CM | POA: Diagnosis not present

## 2015-05-06 DIAGNOSIS — L0390 Cellulitis, unspecified: Secondary | ICD-10-CM | POA: Diagnosis not present

## 2015-05-06 DIAGNOSIS — T8131XD Disruption of external operation (surgical) wound, not elsewhere classified, subsequent encounter: Secondary | ICD-10-CM | POA: Diagnosis not present

## 2015-05-06 DIAGNOSIS — S8012XS Contusion of left lower leg, sequela: Secondary | ICD-10-CM | POA: Diagnosis not present

## 2015-05-06 DIAGNOSIS — S81802A Unspecified open wound, left lower leg, initial encounter: Secondary | ICD-10-CM | POA: Diagnosis not present

## 2015-05-06 DIAGNOSIS — M79605 Pain in left leg: Secondary | ICD-10-CM | POA: Diagnosis not present

## 2015-05-06 DIAGNOSIS — S8012XA Contusion of left lower leg, initial encounter: Secondary | ICD-10-CM | POA: Diagnosis not present

## 2015-05-06 DIAGNOSIS — S81802S Unspecified open wound, left lower leg, sequela: Secondary | ICD-10-CM | POA: Diagnosis not present

## 2015-05-06 DIAGNOSIS — S8012XD Contusion of left lower leg, subsequent encounter: Secondary | ICD-10-CM | POA: Diagnosis not present

## 2015-05-06 DIAGNOSIS — I872 Venous insufficiency (chronic) (peripheral): Secondary | ICD-10-CM | POA: Diagnosis not present

## 2015-05-06 DIAGNOSIS — T148 Other injury of unspecified body region: Secondary | ICD-10-CM | POA: Diagnosis not present

## 2015-05-13 DIAGNOSIS — S81802D Unspecified open wound, left lower leg, subsequent encounter: Secondary | ICD-10-CM | POA: Diagnosis not present

## 2015-05-13 DIAGNOSIS — T148 Other injury of unspecified body region: Secondary | ICD-10-CM | POA: Diagnosis not present

## 2015-05-13 DIAGNOSIS — S81802A Unspecified open wound, left lower leg, initial encounter: Secondary | ICD-10-CM | POA: Diagnosis not present

## 2015-05-13 DIAGNOSIS — S8012XD Contusion of left lower leg, subsequent encounter: Secondary | ICD-10-CM | POA: Diagnosis not present

## 2015-05-13 DIAGNOSIS — S8012XS Contusion of left lower leg, sequela: Secondary | ICD-10-CM | POA: Diagnosis not present

## 2015-05-13 DIAGNOSIS — S8012XA Contusion of left lower leg, initial encounter: Secondary | ICD-10-CM | POA: Diagnosis not present

## 2015-05-13 DIAGNOSIS — I872 Venous insufficiency (chronic) (peripheral): Secondary | ICD-10-CM | POA: Diagnosis not present

## 2015-05-13 DIAGNOSIS — S81812D Laceration without foreign body, left lower leg, subsequent encounter: Secondary | ICD-10-CM | POA: Diagnosis not present

## 2015-05-13 DIAGNOSIS — T8131XD Disruption of external operation (surgical) wound, not elsewhere classified, subsequent encounter: Secondary | ICD-10-CM | POA: Diagnosis not present

## 2015-05-13 DIAGNOSIS — M79605 Pain in left leg: Secondary | ICD-10-CM | POA: Diagnosis not present

## 2015-05-13 DIAGNOSIS — S81802S Unspecified open wound, left lower leg, sequela: Secondary | ICD-10-CM | POA: Diagnosis not present

## 2015-05-13 DIAGNOSIS — L0390 Cellulitis, unspecified: Secondary | ICD-10-CM | POA: Diagnosis not present

## 2015-05-14 ENCOUNTER — Encounter: Payer: Self-pay | Admitting: Cardiology

## 2015-05-14 ENCOUNTER — Ambulatory Visit (INDEPENDENT_AMBULATORY_CARE_PROVIDER_SITE_OTHER): Payer: Medicare Other | Admitting: Cardiology

## 2015-05-14 VITALS — BP 136/94 | HR 61 | Ht 71.0 in | Wt 269.4 lb

## 2015-05-14 DIAGNOSIS — I2510 Atherosclerotic heart disease of native coronary artery without angina pectoris: Secondary | ICD-10-CM | POA: Diagnosis not present

## 2015-05-14 DIAGNOSIS — I480 Paroxysmal atrial fibrillation: Secondary | ICD-10-CM

## 2015-05-14 DIAGNOSIS — I5032 Chronic diastolic (congestive) heart failure: Secondary | ICD-10-CM

## 2015-05-14 DIAGNOSIS — R0602 Shortness of breath: Secondary | ICD-10-CM | POA: Diagnosis not present

## 2015-05-14 MED ORDER — AMIODARONE HCL 200 MG PO TABS: 200.0000 mg | ORAL_TABLET | Freq: Every day | ORAL | Status: DC

## 2015-05-14 MED ORDER — FUROSEMIDE 40 MG PO TABS: 60.0000 mg | ORAL_TABLET | Freq: Every day | ORAL | Status: DC

## 2015-05-14 NOTE — Assessment & Plan Note (Signed)
Continue statin. 

## 2015-05-14 NOTE — Assessment & Plan Note (Signed)
Continue amiodarone. 

## 2015-05-14 NOTE — Assessment & Plan Note (Signed)
Continue statin. Not on aspirin given need for anticoagulation. 

## 2015-05-14 NOTE — Progress Notes (Signed)
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 LHC 10/2009: pLAD 20%, dLAD 30%, mOM2 70-80% (unchanged compared to previous), pRCA 20%, dRCA 20%.  Admitted in February of 2012 with a wide complex tachycardia. Seen by EP and rhythm was most likely felt to be SVT with aberrancy. It was felt that if symptoms recur ablation would be warranted.  Cardiac MRI (09/05/13): Moderate LVE, focal septal hypertrophy, EF 51%, global HK, mild RVE, normal RVSF, no evidence of cardiac amyloid. He has had some increase in his light chains on electrophoresis. This is being followed. Nuclear study in March of 2015 showed apical thinning but no ischemia. Ejection fraction was 48%. Echo 11/15 showed EF 18-84, grade 2 diastolic dysfunction, mild LAE and mildly dilated aortic root. Since last seen, he does have some dyspnea on exertion. No orthopnea, PND, chest pain or syncope.  Current Outpatient Prescriptions  Medication Sig Dispense Refill  . acyclovir (ZOVIRAX) 400 MG tablet Take 1 tablet (400 mg total) by mouth daily. 90 tablet 3  . amiodarone (PACERONE) 200 MG tablet Take 1 tablet (200 mg total) by mouth daily. 30 tablet 12  . furosemide (LASIX) 40 MG tablet Take 40 mg by mouth daily.    . nitroGLYCERIN (NITROSTAT) 0.4 MG SL tablet Place 1 tablet (0.4 mg total) under the tongue every 5 (five) minutes as needed for chest pain. 25 tablet 12  . pravastatin (PRAVACHOL) 40 MG tablet Take 1 tablet (40 mg total) by mouth every evening. 90 tablet 3  . rivaroxaban (XARELTO) 20 MG TABS tablet Take 1 tablet (20 mg total) by mouth daily with supper. (Patient taking differently: Take 20 mg by mouth daily with supper. ON HOLD DUE TO INJURY) 90 tablet 3  . Turmeric 500 MG CAPS Take 1,000 mg by mouth daily.     No current facility-administered medications for this visit.     Past Medical History  Diagnosis Date  . Hypertension   . Coronary atherosclerosis  of unspecified type of vessel, native or graft     Non obstructive  . Cerebrovascular disease, unspecified   . Pure hypercholesterolemia   . Diverticulosis of colon (without mention of hemorrhage)   . Benign neoplasm of colon   . Unspecified hemorrhoids without mention of complication   . Degenerative disc disease   . 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 Hemangioma   . Amyloidosis   . LBBB (left bundle branch block)   . 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 OSA (obstructive sleep apnea)   . Atrial fibrillation   . Cancer     amyloidosis  . Edema 08/29/2013  . Hx of cardiovascular stress test     Adenosine Myoview (02/2014): Apical thinning, no ischemia, EF 48%; low risk.  . Scrotal pain 10/23/2014  . Adenomatous colon polyp 2003    max size 45mm    Past Surgical History  Procedure Laterality Date  . Knee arthroscopy    . Carpal tunnel release  08/2006    Dr Doy Mince at [facility] [place] [facility] - Bilateral  . 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 Back surgery      Lumbar  . Cardioversion N/A 07/10/2013    Procedure: CARDIOVERSION;  Surgeon: Lelon Perla, MD;  Location: [facility] ENDOSCOPY;  Service: Cardiovascular;  Laterality: N/A;  . Colonoscopy w/ biopsies      History   Social  History  . Marital Status: Married    Spouse Name: N/A  . Number of Children: 2  . Years of Education: N/A   Occupational History  . PRESIDENT     [employer]   Social History Main Topics  . Smoking status: Never Smoker   . Smokeless tobacco: Never Used  . Alcohol Use: 0.0 oz/week    0 Standard drinks or equivalent per week     Comment: Occassionally  . Drug Use: No  . Sexual Activity: No   Other Topics Concern  . Not on file   Social History Narrative   Lives with wife.      ROS: no fevers or chills, productive cough, hemoptysis, dysphasia, odynophagia, melena, hematochezia, dysuria, hematuria, rash, seizure activity, orthopnea,  PND, pedal edema, claudication. Remaining systems are negative.  Physical Exam: Well-developed well-nourished in no acute distress.  Skin is warm and dry.  HEENT is normal.  Neck is supple.  Chest is clear to auscultation with normal expansion.  Cardiovascular exam is regular rate and rhythm.  Abdominal exam nontender or distended. No masses palpated. Extremities show 1+ ankle edema. neuro grossly intact  ECG sinus rhythm with first-degree AV block. Left bundle branch block.

## 2015-05-14 NOTE — Assessment & Plan Note (Signed)
Patient remains in sinus rhythm. Continue amiodarone and xarelto. In 6 months when he returns check TSH, liver function, hemoglobin, renal function and chest x-ray.

## 2015-05-14 NOTE — Patient Instructions (Signed)
Your physician wants you to follow-up in: 6 MONTHS  You will receive a reminder letter in the mail two months in advance. If you don't receive a letter, please call our office to schedule the follow-up appointment.   INCREASE FUROSEMIDE TO 60 MG ONCE DAILY= 1 AND 1/2 OF 40 MG TABLET ONCE DAILY  Your physician recommends that you return for lab work in: ONE WEEK=3RD FLOOR

## 2015-05-14 NOTE — Assessment & Plan Note (Signed)
Patient is volume overloaded on examination. Increase Lasix to 60 mg daily. In 1 week check renal function and BNP.

## 2015-05-14 NOTE — Assessment & Plan Note (Signed)
Continue present blood pressure medications. 

## 2015-05-20 DIAGNOSIS — S81802S Unspecified open wound, left lower leg, sequela: Secondary | ICD-10-CM | POA: Diagnosis not present

## 2015-05-20 DIAGNOSIS — T8131XD Disruption of external operation (surgical) wound, not elsewhere classified, subsequent encounter: Secondary | ICD-10-CM | POA: Diagnosis not present

## 2015-05-20 DIAGNOSIS — S81802A Unspecified open wound, left lower leg, initial encounter: Secondary | ICD-10-CM | POA: Diagnosis not present

## 2015-05-20 DIAGNOSIS — T148 Other injury of unspecified body region: Secondary | ICD-10-CM | POA: Diagnosis not present

## 2015-05-20 DIAGNOSIS — I872 Venous insufficiency (chronic) (peripheral): Secondary | ICD-10-CM | POA: Diagnosis not present

## 2015-05-20 DIAGNOSIS — L0390 Cellulitis, unspecified: Secondary | ICD-10-CM | POA: Diagnosis not present

## 2015-05-20 DIAGNOSIS — S8012XS Contusion of left lower leg, sequela: Secondary | ICD-10-CM | POA: Diagnosis not present

## 2015-05-20 DIAGNOSIS — M79605 Pain in left leg: Secondary | ICD-10-CM | POA: Diagnosis not present

## 2015-05-20 DIAGNOSIS — S8012XD Contusion of left lower leg, subsequent encounter: Secondary | ICD-10-CM | POA: Diagnosis not present

## 2015-05-20 DIAGNOSIS — S81802D Unspecified open wound, left lower leg, subsequent encounter: Secondary | ICD-10-CM | POA: Diagnosis not present

## 2015-05-20 DIAGNOSIS — S8012XA Contusion of left lower leg, initial encounter: Secondary | ICD-10-CM | POA: Diagnosis not present

## 2015-05-20 DIAGNOSIS — S81812D Laceration without foreign body, left lower leg, subsequent encounter: Secondary | ICD-10-CM | POA: Diagnosis not present

## 2015-05-27 ENCOUNTER — Telehealth: Payer: Self-pay | Admitting: Cardiology

## 2015-05-27 DIAGNOSIS — M545 Low back pain: Secondary | ICD-10-CM | POA: Diagnosis not present

## 2015-05-27 DIAGNOSIS — M5136 Other intervertebral disc degeneration, lumbar region: Secondary | ICD-10-CM | POA: Diagnosis not present

## 2015-05-27 DIAGNOSIS — Z6837 Body mass index (BMI) 37.0-37.9, adult: Secondary | ICD-10-CM | POA: Diagnosis not present

## 2015-05-27 NOTE — Telephone Encounter (Signed)
Pt seen by neurosurgeon - was not given indication that surgery would help his back problems - did not have anything to report regarding treatment. 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  Advised to contact PCP to see if appropriate, or they may defer to other specialist. Pt agreeable to this plan.

## 2015-05-27 NOTE — Telephone Encounter (Signed)
Pt called in wanting to speak with Hilda Blades about taking Prednisone. Please call  Thanks

## 2015-05-28 ENCOUNTER — Ambulatory Visit (INDEPENDENT_AMBULATORY_CARE_PROVIDER_SITE_OTHER): Payer: Medicare Other | Admitting: Internal Medicine

## 2015-05-28 ENCOUNTER — Encounter: Payer: Self-pay | Admitting: Internal Medicine

## 2015-05-28 VITALS — BP 132/82 | HR 64 | Temp 98.7°F | Ht 71.0 in | Wt 268.5 lb

## 2015-05-28 DIAGNOSIS — I2510 Atherosclerotic heart disease of native coronary artery without angina pectoris: Secondary | ICD-10-CM

## 2015-05-28 DIAGNOSIS — M545 Low back pain, unspecified: Secondary | ICD-10-CM

## 2015-05-28 MED ORDER — PREDNISONE 10 MG PO TABS: ORAL_TABLET | ORAL | Status: DC

## 2015-05-28 NOTE — Progress Notes (Signed)
Pre visit review using our clinic review tool, if applicable. No additional management support is needed unless otherwise documented below in the visit note. 

## 2015-05-28 NOTE — Progress Notes (Signed)
Subjective:    Patient ID: Johnny Mitchell, male    DOB: 1941-01-09, 74 y.o.   MRN: [id number]  DOS:  05/28/2015 Type of visit - description : Acute Interval history: Ongoing back pain, status post eval by 2 neurosurgeons, they recommend no surgery. The pain is at the mid lower back, only when he stands, the longer he stands the worse the pain is. One of his friends receive prednisone for back pain and he got better, the patient strongly desires to try the medication   Review of Systems  No fever chills No actual claudication Very seldom has numbness in the  right foot.  Past Medical History  Diagnosis Date  . Hypertension   . Coronary atherosclerosis of unspecified type of vessel, native or graft     Non obstructive  . Cerebrovascular disease, unspecified   . Pure hypercholesterolemia   . Diverticulosis of colon (without mention of hemorrhage)   . Benign neoplasm of colon   . Unspecified hemorrhoids without mention of complication   . Degenerative disc disease   . 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 Hemangioma   . Amyloidosis   . LBBB (left bundle branch block)   . 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 OSA (obstructive sleep apnea)   . Atrial fibrillation   . Cancer     amyloidosis  . Edema 08/29/2013  . Hx of cardiovascular stress test     Adenosine Myoview (02/2014): Apical thinning, no ischemia, EF 48%; low risk.  . Scrotal pain 10/23/2014  . Adenomatous colon polyp 2003    max size 88mm    Past Surgical History  Procedure Laterality Date  . Knee arthroscopy    . Carpal tunnel release  08/2006    Dr Doy Mince at [facility] - Bilateral  . 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 Back surgery  2012    Lumbar, [name]  . Cardioversion N/A 07/10/2013    Procedure: CARDIOVERSION;  Surgeon: Lelon Perla, MD;  Location: [facility] ENDOSCOPY;  Service: Cardiovascular;  Laterality: N/A;  . Colonoscopy w/ biopsies        History   Social History  . Marital Status: Married    Spouse Name: N/A  . Number of Children: 2  . Years of Education: N/A   Occupational History  . PRESIDENT     [employer]   Social History Main Topics  . Smoking status: Never Smoker   . Smokeless tobacco: Never Used  . Alcohol Use: 0.0 oz/week    0 Standard drinks or equivalent per week     Comment: Occassionally  . Drug Use: No  . Sexual Activity: No   Other Topics Concern  . Not on file   Social History Narrative   Lives with wife.          Medication List       This list is accurate as of: 05/28/15 11:59 PM.  Always use your most recent med list.               acyclovir 400 MG tablet  Commonly known as:  ZOVIRAX  Take 1 tablet (400 mg total) by mouth daily.     amiodarone 200 MG tablet  Commonly known as:  PACERONE  Take 1 tablet (200 mg total) by mouth daily.     furosemide 40 MG tablet  Commonly known as:  LASIX  Take 1.5 tablets (  60 mg total) by mouth daily.     nitroGLYCERIN 0.4 MG SL tablet  Commonly known as:  NITROSTAT  Place 1 tablet (0.4 mg total) under the tongue every 5 (five) minutes as needed for chest pain.     pravastatin 40 MG tablet  Commonly known as:  PRAVACHOL  Take 1 tablet (40 mg total) by mouth every evening.     predniSONE 10 MG tablet  Commonly known as:  DELTASONE  4 tablets x 2 days, 3 tabs x 2 days, 2 tabs x 2 days, 1 tab x 2 days     rivaroxaban 20 MG Tabs tablet  Commonly known as:  XARELTO  Take 1 tablet (20 mg total) by mouth daily with supper.     Turmeric 500 MG Caps  Take 1,000 mg by mouth daily.           Objective:   Physical Exam BP 132/82 mmHg  Pulse 64  Temp(Src) 98.7 F (37.1 C) (Oral)  Ht 5\' 11"  (1.803 m)  Wt 268 lb 8 oz (121.791 kg)  BMI 37.46 kg/m2  SpO2 96%  General:   Well developed, well nourished . NAD.  HEENT:  Normocephalic . Face symmetric, atraumatic Abdomen:  Not distended but prominent abdomen, soft,  non-tender.   Skin: Not pale. Not jaundice LE-- no edema  MSK-- no TTP at the lower back Neurologic:  alert & oriented X3.  Speech normal, gait appropriate for age , unassisted Strength symmetric Posture-- hunched shoulders   Psych--  Cognition and judgment appear intact.  Cooperative with normal attention span and concentration.  Behavior appropriate. No anxious or depressed appearing.      Assessment & Plan:

## 2015-05-28 NOTE — Assessment & Plan Note (Addendum)
Ongoing back pain, chronic. He saw Dr.Elsner last year, and Dr. Katherine Roan at [facility] yesterday, the examined him and saw his latest MRI from 2014 and they did not recommend surgery. The patient likes to know what are now his options, does not like to take strong pain medication. Plan: Physical therapy to correct his posture and strengthen his core muscles Weight loss to decrease his abdomen prominence, calorie counting? The patient strongly requests prednisone, I told him I'm not convinced that will be helpful long-term but he nevertheless likes to try. I somewhat reluctantly prescribed a mid size dose prednisone pack. Return to the office in 3 months Multiple questions answered, time spent more than 25 minutes  Today , I spent more than 25   min with the patient: >50% of the time counseling regards back pain, treatment options, listening carefully to his concerns, answering multiple questions.

## 2015-05-28 NOTE — Patient Instructions (Signed)
Take prednisone as prescribed  Watch for fluid accumulation, let me know if that becomes a problem  Similar) by counting calories, consider use the app MYFITNESSPAL

## 2015-05-29 DIAGNOSIS — S8012XD Contusion of left lower leg, subsequent encounter: Secondary | ICD-10-CM | POA: Diagnosis not present

## 2015-05-29 DIAGNOSIS — M79605 Pain in left leg: Secondary | ICD-10-CM | POA: Diagnosis not present

## 2015-05-29 DIAGNOSIS — I872 Venous insufficiency (chronic) (peripheral): Secondary | ICD-10-CM | POA: Diagnosis not present

## 2015-05-29 DIAGNOSIS — S81802D Unspecified open wound, left lower leg, subsequent encounter: Secondary | ICD-10-CM | POA: Diagnosis not present

## 2015-05-29 DIAGNOSIS — T148 Other injury of unspecified body region: Secondary | ICD-10-CM | POA: Diagnosis not present

## 2015-05-29 DIAGNOSIS — S81812D Laceration without foreign body, left lower leg, subsequent encounter: Secondary | ICD-10-CM | POA: Diagnosis not present

## 2015-05-29 DIAGNOSIS — I83028 Varicose veins of left lower extremity with ulcer other part of lower leg: Secondary | ICD-10-CM | POA: Diagnosis not present

## 2015-05-29 DIAGNOSIS — S8012XS Contusion of left lower leg, sequela: Secondary | ICD-10-CM | POA: Diagnosis not present

## 2015-05-29 DIAGNOSIS — S81802A Unspecified open wound, left lower leg, initial encounter: Secondary | ICD-10-CM | POA: Diagnosis not present

## 2015-05-29 DIAGNOSIS — L0390 Cellulitis, unspecified: Secondary | ICD-10-CM | POA: Diagnosis not present

## 2015-05-29 DIAGNOSIS — T8131XD Disruption of external operation (surgical) wound, not elsewhere classified, subsequent encounter: Secondary | ICD-10-CM | POA: Diagnosis not present

## 2015-05-29 DIAGNOSIS — S8012XA Contusion of left lower leg, initial encounter: Secondary | ICD-10-CM | POA: Diagnosis not present

## 2015-05-29 DIAGNOSIS — S81802S Unspecified open wound, left lower leg, sequela: Secondary | ICD-10-CM | POA: Diagnosis not present

## 2015-05-29 DIAGNOSIS — L97821 Non-pressure chronic ulcer of other part of left lower leg limited to breakdown of skin: Secondary | ICD-10-CM | POA: Diagnosis not present

## 2015-06-09 ENCOUNTER — Other Ambulatory Visit: Payer: Self-pay

## 2015-06-17 ENCOUNTER — Ambulatory Visit: Payer: Medicare Other | Attending: Internal Medicine | Admitting: Rehabilitation

## 2015-06-17 ENCOUNTER — Encounter: Payer: Self-pay | Admitting: Rehabilitation

## 2015-06-17 DIAGNOSIS — M545 Low back pain, unspecified: Secondary | ICD-10-CM

## 2015-06-17 DIAGNOSIS — R262 Difficulty in walking, not elsewhere classified: Secondary | ICD-10-CM | POA: Diagnosis not present

## 2015-06-17 NOTE — Therapy (Signed)
[facility] [address]  [place], [name], [address] Phone: [phone]   Fax:  [phone]  Physical Therapy Evaluation  Patient Details  Name: Johnny Mitchell MRN: [id number] Date of Birth: 08-09-1941 Referring Provider:  Colon Branch, MD  Encounter Date: 06/17/2015      PT End of Session - 06/17/15 1435    Visit Number 1   Number of Visits 12   Date for PT Re-Evaluation 07/29/15   PT Start Time 1340   PT Stop Time 1430   PT Time Calculation (min) 50 min   Activity Tolerance Patient tolerated treatment well      Past Medical History  Diagnosis Date  . Hypertension   . Coronary atherosclerosis of unspecified type of vessel, native or graft     Non obstructive  . Cerebrovascular disease, unspecified   . Pure hypercholesterolemia   . Diverticulosis of colon (without mention of hemorrhage)   . Benign neoplasm of colon   . Unspecified hemorrhoids without mention of complication   . Degenerative disc disease   . 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 Hemangioma   . Amyloidosis   . LBBB (left bundle branch block)   . 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 OSA (obstructive sleep apnea)   . Atrial fibrillation   . Cancer     amyloidosis  . Edema 08/29/2013  . Hx of cardiovascular stress test     Adenosine Myoview (02/2014): Apical thinning, no ischemia, EF 48%; low risk.  . Scrotal pain 10/23/2014  . Adenomatous colon polyp 2003    max size 68mm  . Traumatic open wound of left lower leg with delayed healing     Seen at [facility] on 05/29/2015  . 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 Chronic venous insufficiency 2016  . DDD (degenerative disc disease), lumbar     Dr. Katherine Roan    Past Surgical History  Procedure Laterality Date  . Knee arthroscopy    . Carpal tunnel release   08/2006    Dr Doy Mince at [facility] - Bilateral  . 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 Back surgery  2012    Lumbar, [name] [place]  . Cardioversion N/A 07/10/2013    Procedure: CARDIOVERSION;  Surgeon: Lelon Perla, MD;  Location: [facility] ENDOSCOPY;  Service: Cardiovascular;  Laterality: N/A;  . Colonoscopy w/ biopsies      There were no vitals filed for this visit.  Visit Diagnosis:  Midline low back pain without sciatica - Plan: PT plan of care cert/re-cert  Difficulty walking - Plan: PT plan of care cert/re-cert      Subjective Assessment - 06/17/15 1346    Subjective Pt presents with constant pain with any time of walking and movement.  The pain is low back midline. Had a lumbar fusion of L5/S1 in 2012 that did not help at all.  No change in the pain. The neurosurgeons say no surgery.     How long can you sit comfortably? no problems   How long can you stand comfortably? 15 mintues   How long can you walk comfortably? 6minutes   Patient Stated Goals Would like to improve standing tolerance to more than 21minutes   Currently in Pain? Other (Comment)  pain with walking 5-6/10 at the most. No other alleviating  factors; aggravating factors incldue standing and walking            [facility] PT Assessment - 06/17/15 0001    Assessment   Medical Diagnosis low back pain   Next MD Visit no check up   Prior Therapy none   Precautions   Precautions None   Restrictions   Weight Bearing Restrictions No   Balance Screen   Has the patient fallen in the past 6 months Yes   How many times? 1  fell in the bushes   Has the patient had a decrease in activity level because of a fear of falling?  No   Is the patient reluctant to leave their home because of a fear of falling?  No   Home Ec[employer] residence   Prior Function   Vocation Full time employment   Vocation Requirements no lifting required   Observation/Other Assessments   Focus on Therapeutic Outcomes  (FOTO)  48% limited   Functional Tests   Functional tests Sit to Stand   Sit to Stand   Comments --  without hands fine   Posture/Postural Control   Posture Comments increased thoracic kyphosis   ROM / Strength   AROM / PROM / Strength AROM;PROM;Strength   AROM   AROM Assessment Site Lumbar   Lumbar Flexion fingertips to mid shins   Lumbar Extension 10deg   Lumbar - Right Side Bend fingers 4"from knee joint   Lumbar - Left Side Bend fingers 4"from knee joint   Lumbar - Right Rotation <20% due to tspine stiffness   Lumbar - Left Rotation <20% due to tspine stiffness   PROM   Overall PROM Comments decreased flexibility overall bilateral KTOS, piriformis, hip rotation   Strength   Strength Assessment Site Hip;Knee   Right/Left Hip Right;Left   Right Hip Flexion 4-/5   Right Hip External Rotation  --  4/5   Right Hip Internal Rotation  --  4/5   Right Hip ABduction 4/5   Left Hip Flexion 4-/5   Left Hip External Rotation  --  4+/5   Left Hip Internal Rotation  --  4+/5   Left Hip ABduction 4/5   Right/Left Knee --   Flexibility   Soft Tissue Assessment /Muscle Length yes   Hamstrings 50deg bil   Quadriceps decreased to 90deg bilaterally   Ambulation/Gait   Gait Comments ambulates wide BOS, increased trunk flexion, but no pain in clinic       Eval Performed Education on HEP with performance of each per pt instruction section                    PT Education - 06/17/15 1434    Education provided Yes   Education Details POC and diagnosis   Person(s) Educated Patient   Methods Explanation;Demonstration;Handout   Comprehension Verbalized understanding          PT Short Term Goals - 06/17/15 1439    PT SHORT TERM GOAL #1   Title Pt will be independent with initial HEP   Time 2   Period Weeks   Status New           PT Long Term Goals - 06/17/15 1440    PT LONG TERM GOAL #1   Title Pt will be independent with long term HEP to include  thoracic, lumbar, and LE flexibility and core strengthening   Time 6   Period Weeks   Status New  PT LONG TERM GOAL #2   Title Pt will improve standing lumbar rotation to at least 40% to demonstrate improved spinal mobility   Time 6   Period Weeks   Status New   PT LONG TERM GOAL #3   Title Pt will report no increased back pain after walking x 79minutes in the community   Time 6   Period Weeks   Status New               Plan - July 05, 2015 1435    Clinical Impression Statement Pt presents with chronic LBP only when walking or standing more than 4minutes after a failed lumbar fusion of L5(?) 4 years ago.  No other pain or difficulty reported by patient except difficulty getting his shoes on; whch correlates to hip stiffness.  Pt exhibits significant stiffness in the mid back/tspine, bilateral hips, and  lumbar spine.  LE weakness not significant except for some hip flexor weakness.  Needing more general core strength and conditioning.    Pt will benefit from skilled therapeutic intervention in order to improve on the following deficits Decreased strength;Decreased range of motion;Difficulty walking;Hypomobility;Impaired flexibility;Postural dysfunction;Pain   Rehab Potential Good   PT Frequency 2x / week   PT Duration 6 weeks   PT Treatment/Interventions Electrical Stimulation;Moist Heat;Traction;Therapeutic exercise;Neuromuscular re-education;Manual techniques   PT Next Visit Plan address tspine rotation and extension deficits; manual mob and stretches, begin LE/core TE,   Consulted and Agree with Plan of Care Patient          G-Codes - July 05, 2015 1441    Functional Assessment Tool Used FOTO48%   Functional Limitation Mobility: Walking and moving around   Mobility: Walking and Moving Around Current Status [phone]) At least 40 percent but less than 60 percent impaired, limited or restricted   Mobility: Walking and Moving Around Goal Status [phone]) At least 20 percent but less than  40 percent impaired, limited or restricted       Problem List Patient Active Problem List   Diagnosis Date Noted  . DDD (degenerative disc disease), lumbar 05/27/2015  . 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 Wound healing, delayed 03/27/2015  . Left leg pain 02/27/2015  . Chronic venous insufficiency 02/20/2015  . Hematoma of leg 02/20/2015  . Open wound of lower leg with complication 99/37/1696  . Wound cellulitis 02/20/2015  . 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 Scrotal pain 10/23/2014  . Dyspnea on exertion 10/23/2014  . Acute on chronic diastolic congestive heart failure 09/30/2014  . Edema 08/29/2013  . Osteopenia 08/29/2013  . Chronic diastolic heart failure 78/93/8101  . Atrial fibrillation 05/31/2013  . Hyperkalemia 05/31/2013  . Pedal edema 05/11/2012  . OBSTRUCTIVE SLEEP APNEA 02/17/2011  . PAROXYSMAL SUPRAVENTRICULAR TACHYCARDIA 02/11/2011  . Amyloidosis 12/19/2009  . LEUKOCYTOSIS 11/14/2009  . DYSPNEA 10/20/2009  . CHEST PAIN 09/19/2009  . BLOOD IN STOOL 08/14/2009  . BACK PAIN, LUMBAR 01/13/2009  . HEMANGIOMA OF OTHER SITES 12/31/2007  . CEREBROVASCULAR DISEASE 12/31/2007  . SPONDYLOSIS, CERVICAL, WITHOUT MYELOPATHY 12/31/2007  . DIVERTICULOSIS OF COLON 12/27/2007  . COLONIC POLYPS 12/26/2007  . HYPERCHOLESTEROLEMIA 12/26/2007  . Obesity 12/26/2007  . Essential hypertension 12/26/2007  . Coronary atherosclerosis 12/26/2007  . HEMORRHOIDS 12/26/2007  . DEGENERATIVE JOINT DISEASE 12/26/2007    Stark Bray, DPT, CMP 07/05/2015, 4:23 PM  [facility] [address]  [address] [place], [name], [address] Phone: [phone]   Fax:  [phone]

## 2015-06-18 ENCOUNTER — Other Ambulatory Visit: Payer: Self-pay | Admitting: Hematology and Oncology

## 2015-06-19 ENCOUNTER — Other Ambulatory Visit (HOSPITAL_BASED_OUTPATIENT_CLINIC_OR_DEPARTMENT_OTHER): Payer: Medicare Other

## 2015-06-19 ENCOUNTER — Other Ambulatory Visit: Payer: Self-pay | Admitting: Hematology and Oncology

## 2015-06-19 ENCOUNTER — Ambulatory Visit: Payer: Medicare Other | Admitting: Physical Therapy

## 2015-06-19 ENCOUNTER — Other Ambulatory Visit: Payer: Self-pay | Admitting: *Deleted

## 2015-06-19 DIAGNOSIS — R262 Difficulty in walking, not elsewhere classified: Secondary | ICD-10-CM | POA: Diagnosis not present

## 2015-06-19 DIAGNOSIS — M545 Low back pain, unspecified: Secondary | ICD-10-CM

## 2015-06-19 DIAGNOSIS — E859 Amyloidosis, unspecified: Secondary | ICD-10-CM | POA: Diagnosis not present

## 2015-06-19 LAB — COMPREHENSIVE METABOLIC PANEL (CC13)
ALBUMIN: 3.2 g/dL — AB (ref 3.5–5.0)
ALK PHOS: 119 U/L (ref 40–150)
ALT: 16 U/L (ref 0–55)
AST: 17 U/L (ref 5–34)
Anion Gap: 9 mEq/L (ref 3–11)
BUN: 22.5 mg/dL (ref 7.0–26.0)
CALCIUM: 8.9 mg/dL (ref 8.4–10.4)
CHLORIDE: 105 meq/L (ref 98–109)
CO2: 29 mEq/L (ref 22–29)
Creatinine: 1.4 mg/dL — ABNORMAL HIGH (ref 0.7–1.3)
EGFR: 49 mL/min/{1.73_m2} — ABNORMAL LOW (ref >90)
Glucose: 111 mg/dl (ref 70–140)
POTASSIUM: 3.9 meq/L (ref 3.5–5.1)
Sodium: 144 mEq/L (ref 136–145)
Total Bilirubin: 0.49 mg/dL (ref 0.20–1.20)
Total Protein: 5.9 g/dL — ABNORMAL LOW (ref 6.4–8.3)

## 2015-06-19 LAB — CBC WITH DIFFERENTIAL/PLATELET
BASO%: 0.6 % (ref 0.0–2.0)
Basophils Absolute: 0.1 10*3/uL (ref 0.0–0.1)
EOS ABS: 0.1 10*3/uL (ref 0.0–0.5)
EOS%: 0.6 % (ref 0.0–7.0)
HEMATOCRIT: 46.6 % (ref 38.4–49.9)
HGB: 15.1 g/dL (ref 13.0–17.1)
LYMPH%: 12.9 % — AB (ref 14.0–49.0)
MCH: 32.1 pg (ref 27.2–33.4)
MCHC: 32.4 g/dL (ref 32.0–36.0)
MCV: 98.9 fL — ABNORMAL HIGH (ref 79.3–98.0)
MONO#: 0.5 10*3/uL (ref 0.1–0.9)
MONO%: 6.3 % (ref 0.0–14.0)
NEUT%: 79.6 % — ABNORMAL HIGH (ref 39.0–75.0)
NEUTROS ABS: 6.6 10*3/uL — AB (ref 1.5–6.5)
NRBC: 0 % (ref 0–0)
PLATELETS: 200 10*3/uL (ref 140–400)
RBC: 4.71 10*6/uL (ref 4.20–5.82)
RDW: 13.9 % (ref 11.0–14.6)
WBC: 8.2 10*3/uL (ref 4.0–10.3)
lymph#: 1.1 10*3/uL (ref 0.9–3.3)

## 2015-06-19 NOTE — Therapy (Signed)
Johnny Mitchell [address]  [place], [name], [address] Phone: [phone]   Fax:  [phone]  Physical Therapy Treatment  Patient Details  Name: Johnny Mitchell MRN: [id number] Date of Birth: 01-20-1941 Referring Provider:  Colon Branch, MD  Encounter Date: 06/19/2015      PT End of Session - 06/19/15 1503    Visit Number 2   Number of Visits 12   Date for PT Re-Evaluation 07/29/15   PT Start Time 2831  pt late   PT Stop Time 1536   PT Time Calculation (min) 38 min      Past Medical History  Diagnosis Date  . Hypertension   . Coronary atherosclerosis of unspecified type of vessel, native or graft     Non obstructive  . Cerebrovascular disease, unspecified   . Pure hypercholesterolemia   . Diverticulosis of colon (without mention of hemorrhage)   . Benign neoplasm of colon   . Unspecified hemorrhoids without mention of complication   . Degenerative disc disease   . 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 Hemangioma   . Amyloidosis   . LBBB (left bundle branch block)   . 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 OSA (obstructive sleep apnea)   . Atrial fibrillation   . Cancer     amyloidosis  . Edema 08/29/2013  . Hx of cardiovascular stress test     Adenosine Myoview (02/2014): Apical thinning, no ischemia, EF 48%; low risk.  . Scrotal pain 10/23/2014  . Adenomatous colon polyp 2003    max size 3mm  . Traumatic open wound of left lower leg with delayed healing     Seen at [facility] on 05/29/2015  . 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 Chronic venous insufficiency 2016  . DDD (degenerative disc disease), lumbar     Dr. Katherine Roan    Past Surgical History  Procedure Laterality Date  . Knee arthroscopy    . Carpal tunnel release  08/2006    Dr Doy Mince at [facility] - Bilateral  . 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 Back surgery  2012    Lumbar, [name] Albert  . Cardioversion N/A 07/10/2013    Procedure: CARDIOVERSION;  Surgeon: Lelon Perla, MD;  Location[facility] ENDOSCOPY;  Service: Cardiovascular;  Laterality: N/A;  . Colonoscopy w/ biopsies      There were no vitals filed for this visit.  Visit Diagnosis:  Midline low back pain without sciatica  Difficulty walking      Subjective Assessment - 06/19/15 1459    Subjective pt late.  States AVG pain 6-7/10 with walking greater than 1 minute   Currently in Pain? Yes   Pain Score 6    Pain Location Back   Pain Orientation Lower;Mid   Aggravating Factors  walking   Pain Relieving Factors sitting, lying down         TODAY'S TREATMENT TherEx - Bridge 15x Stretch B HS and B UTR x 30" Side-Lying Hip ABD 10x each Seated One-Arm row with trunk rotation 15x each (added to HEP) TRX Squat 10x (focus on some lumbar lordosis with pelvic anterior tilt) Low Row 35# 15x Wall Squat with Foam Roll perp to spine to assist with trunk extension 10x  PT Education - 06/19/15 1539    Education provided Yes   Education Details added seated one arm row to HEP   Person(s) Educated Patient   Methods Explanation;Demonstration   Comprehension Verbalized understanding;Returned demonstration          PT Short Term Goals - 06/19/15 1538    PT SHORT TERM GOAL #1   Title Pt will be independent with initial HEP   Status On-going           PT Long Term Goals - 06/19/15 1539    PT LONG TERM GOAL #1   Title Pt will be independent with long term HEP to include thoracic, lumbar, and LE flexibility and core strengthening by 07/29/15   Status On-going   PT LONG TERM GOAL #2   Title Pt will improve standing lumbar rotation to at least 40% to demonstrate improved spinal mobility by 07/29/15   Status On-going   PT LONG TERM GOAL #3   Title Pt will report no increased back pain after  walking x 77minutes in the community by 07/29/15   Status On-going               Plan - 06/19/15 1534    Clinical Impression Statement Pt with very forward flexed posture with flattened l-spine, increased t-spine kyphosis, and forward head.  He reports decreased pain when stands up straight during today's treatment.  He displays exceptional tighness throughout trunk and B hips.  Improving tissue pliability along with increasing postural muscle strength will be emphasis of treatments to help promote more neutral posture.   PT Next Visit Plan hip and trunk strengthening and stretching to promote more upright posture (extension)   Consulted and Agree with Plan of Care Patient        Problem List Patient Active Problem List   Diagnosis Date Noted  . DDD (degenerative disc disease), lumbar 05/27/2015  . 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 Wound healing, delayed 03/27/2015  . Left leg pain 02/27/2015  . Chronic venous insufficiency 02/20/2015  . Hematoma of leg 02/20/2015  . Open wound of lower leg with complication 92/33/0076  . Wound cellulitis 02/20/2015  . 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 Scrotal pain 10/23/2014  . Dyspnea on exertion 10/23/2014  . Acute on chronic diastolic congestive heart failure 09/30/2014  . Edema 08/29/2013  . Osteopenia 08/29/2013  . Chronic diastolic heart failure 22/63/3354  . Atrial fibrillation 05/31/2013  . Hyperkalemia 05/31/2013  . Pedal edema 05/11/2012  . OBSTRUCTIVE SLEEP APNEA 02/17/2011  . PAROXYSMAL SUPRAVENTRICULAR TACHYCARDIA 02/11/2011  . Amyloidosis 12/19/2009  . LEUKOCYTOSIS 11/14/2009  . DYSPNEA 10/20/2009  . CHEST PAIN 09/19/2009  . BLOOD IN STOOL 08/14/2009  . BACK PAIN, LUMBAR 01/13/2009  . HEMANGIOMA OF OTHER SITES 12/31/2007  . CEREBROVASCULAR DISEASE 12/31/2007  . SPONDYLOSIS, CERVICAL, WITHOUT MYELOPATHY 12/31/2007  . DIVERTICULOSIS OF COLON 12/27/2007  . COLONIC POLYPS 12/26/2007  . HYPERCHOLESTEROLEMIA  12/26/2007  . Obesity 12/26/2007  . Essential hypertension 12/26/2007  . Coronary atherosclerosis 12/26/2007  . HEMORRHOIDS 12/26/2007  . DEGENERATIVE JOINT DISEASE 12/26/2007    Lelania Bia PT, OCS 06/19/2015, 3:42 PM  [facility] [address]  [place], [name], [address] Phone: [phone]   Fax:  [phone]

## 2015-06-21 ENCOUNTER — Other Ambulatory Visit: Payer: Self-pay | Admitting: Cardiology

## 2015-06-23 LAB — SPEP & IFE WITH QIG
Albumin ELP: 3.2 g/dL — ABNORMAL LOW (ref 3.8–4.8)
Alpha-1-Globulin: 0.3 g/dL (ref 0.2–0.3)
Alpha-2-Globulin: 0.9 g/dL (ref 0.5–0.9)
Beta 2: 0.4 g/dL (ref 0.2–0.5)
Beta Globulin: 0.4 g/dL (ref 0.4–0.6)
Gamma Globulin: 0.9 g/dL (ref 0.8–1.7)
IGA: 171 mg/dL (ref 68–379)
IGG (IMMUNOGLOBIN G), SERUM: 1120 mg/dL (ref 650–1600)
IgM, Serum: 41 mg/dL (ref 41–251)
Total Protein, Serum Electrophoresis: 5.9 g/dL — ABNORMAL LOW (ref 6.1–8.1)

## 2015-06-23 LAB — KAPPA/LAMBDA LIGHT CHAINS
Kappa free light chain: 3.5 mg/dL — ABNORMAL HIGH (ref 0.33–1.94)
Kappa:Lambda Ratio: 0.37 (ref 0.26–1.65)
Lambda Free Lght Chn: 9.54 mg/dL — ABNORMAL HIGH (ref 0.57–2.63)

## 2015-06-24 ENCOUNTER — Ambulatory Visit: Payer: Medicare Other | Admitting: Rehabilitation

## 2015-06-24 DIAGNOSIS — M545 Low back pain, unspecified: Secondary | ICD-10-CM

## 2015-06-24 DIAGNOSIS — R262 Difficulty in walking, not elsewhere classified: Secondary | ICD-10-CM | POA: Diagnosis not present

## 2015-06-24 NOTE — Therapy (Addendum)
[facility] [address]  [place], [name], [address] Phone: [phone]   Fax:  [phone]  Physical Therapy Treatment  Patient Details  Name: Johnny Mitchell MRN: [id number] Date of Birth: January 23, 1941 Referring Provider:  Colon Branch, MD  Encounter Date: 06/24/2015      PT End of Session - 06/24/15 1313    Visit Number 3   Number of Visits 12   Date for PT Re-Evaluation 07/29/15   PT Start Time 1314   PT Stop Time 1335   PT Time Calculation (min) 21 min      Past Medical History  Diagnosis Date  . Hypertension   . Coronary atherosclerosis of unspecified type of vessel, native or graft     Non obstructive  . Cerebrovascular disease, unspecified   . Pure hypercholesterolemia   . Diverticulosis of colon (without mention of hemorrhage)   . Benign neoplasm of colon   . Unspecified hemorrhoids without mention of complication   . Degenerative disc disease   . 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 Hemangioma   . Amyloidosis   . LBBB (left bundle branch block)   . 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 OSA (obstructive sleep apnea)   . Atrial fibrillation   . Cancer     amyloidosis  . Edema 08/29/2013  . Hx of cardiovascular stress test     Adenosine Myoview (02/2014): Apical thinning, no ischemia, EF 48%; low risk.  . Scrotal pain 10/23/2014  . Adenomatous colon polyp 2003    max size 67m  . Traumatic open wound of left lower leg with delayed healing     [facility]  . 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 Chronic venous insufficiency 2016  . DDD (degenerative disc disease), lumbar     Dr. KKatherine Roan   Past Surgical History  Procedure Laterality Date  . Knee arthroscopy    . Carpal tunnel release  08/2006    [facility]  . Bone marrow  transplant  june 2011  . Back surgery  2012    Lumbar, [name] Milroy  . Cardioversion N/A 07/10/2013    Procedure: CARDIOVERSION;  Surgeon: BLelon Perla MD;  [facility];  Service: Cardiovascular;  Laterality: N/A;  . Colonoscopy w/ biopsies      There were no vitals filed for this visit.  Visit Diagnosis:  Midline low back pain without sciatica  Difficulty walking      Subjective Assessment - 06/24/15 1345    Subjective Pt stated he could do all this at home. Doesn't want to come in here for things he can do at home. Pt came in with exercises he printed off the internet and wanted to review them (not perform) to make sure they were ok.    Currently in Pain? --  unsure, pt only wanted to review exercises and not disucss his current situation     TODAY'S TREATMENT: Discussing HEP and exercises from Internet x20' then pt wanted to leave. Pt did not want to perform exercises only discuss them. Pt needs further cuing due to not understanding all exercises.         PT Education - 06/24/15 1350    Education provided Yes   Education Details HEP and exercises  he brought in from home (squats, TrA, sidebends, rotations, balance exercises)   Person(s) Educated Patient   Methods Explanation;Handout   Comprehension Need further instruction  Pt unwilling to perform exercises in clinic, attemped to talk him through all exercises but pt needing further instruction.          PT Short Term Goals - 06/19/15 1538    PT SHORT TERM GOAL #1   Title Pt will be independent with initial HEP   Status On-going           PT Long Term Goals - 06/19/15 1539    PT LONG TERM GOAL #1   Title Pt will be independent with long term HEP to include thoracic, lumbar, and LE flexibility and core strengthening by 07/29/15   Status On-going   PT LONG TERM GOAL #2   Title Pt will improve standing lumbar rotation to at least 40% to demonstrate improved spinal mobility by 07/29/15   Status  On-going   PT LONG TERM GOAL #3   Title Pt will report no increased back pain after walking x 44mnutes in the community by 07/29/15   Status On-going        G-Code - Mobility, Walking Around Current and Discharge CK (40 to 60% limitation) Goal was CL (20 to 40% limitation)       Plan - 06/24/15 1346    Clinical Impression Statement Pt was very short and abrupt with his treatment today. He did not want to perform then exercises he brought in or the ones I gave him. He only wanted to disucss them and leave as soon as he could. At the end, he stated he wanted to cancel all his appointments and return in a month. I instructed him that he can return before August 9th, after that he would need a new referral. He agreed and proceeded to have uKore[place]ancel his appointments stating he would call and schedule another one closer to August 9th.     PT Next Visit Plan On hold x1 month (August 9th), then review exercises/progress as pt is able.    Consulted and Agree with Plan of Care Patient        Problem List Patient Active Problem List   Diagnosis Date Noted  . DDD (degenerative disc disease), lumbar 05/27/2015  . 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 Wound healing, delayed 03/27/2015  . Left leg pain 02/27/2015  . Chronic venous insufficiency 02/20/2015  . Hematoma of leg 02/20/2015  . Open wound of lower leg with complication 045/02/8881 . Wound cellulitis 02/20/2015  . 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 Scrotal pain 10/23/2014  . Dyspnea on exertion 10/23/2014  . Acute on chronic diastolic congestive heart failure 09/30/2014  . Edema 08/29/2013  . Osteopenia 08/29/2013  . Chronic diastolic heart failure 080/02/4916 . Atrial fibrillation 05/31/2013  . Hyperkalemia 05/31/2013  . Pedal edema 05/11/2012  . OBSTRUCTIVE SLEEP APNEA 02/17/2011  . PAROXYSMAL SUPRAVENTRICULAR TACHYCARDIA 02/11/2011  . Amyloidosis 12/19/2009  . LEUKOCYTOSIS 11/14/2009  . DYSPNEA 10/20/2009  .  CHEST PAIN 09/19/2009  . BLOOD IN STOOL 08/14/2009  . BACK PAIN, LUMBAR 01/13/2009  . HEMANGIOMA OF OTHER SITES 12/31/2007  . CEREBROVASCULAR DISEASE 12/31/2007  . SPONDYLOSIS, CERVICAL, WITHOUT MYELOPATHY 12/31/2007  . DIVERTICULOSIS OF COLON 12/27/2007  . COLONIC POLYPS 12/26/2007  . HYPERCHOLESTEROLEMIA 12/26/2007  . Obesity 12/26/2007  . Essential hypertension 12/26/2007  . Coronary atherosclerosis 12/26/2007  . HEMORRHOIDS 12/26/2007  . DEGENERATIVE JOINT DISEASE 12/26/2007    SWilburn Cornelia  Evalee Mutton, PTA 06/24/2015, 1:52 PM  [facility] [address]  [place], [name], [address] Phone: [phone]   Fax:  [phone]     PHYSICAL THERAPY DISCHARGE SUMMARY  Visits from Start of Care: 3  Current functional level related to goals / functional outcomes: unknown   Remaining deficits: unknown   Education / Equipment: HEP Plan: Patient agrees to discharge.  Patient goals were not met. Patient is being discharged due to not returning since the last visit.  ?????        Leonette Most PT, OCS 07/28/2015 2:56 PM

## 2015-06-26 ENCOUNTER — Ambulatory Visit (HOSPITAL_BASED_OUTPATIENT_CLINIC_OR_DEPARTMENT_OTHER): Payer: Medicare Other | Admitting: Hematology and Oncology

## 2015-06-26 ENCOUNTER — Ambulatory Visit: Payer: Medicare Other | Admitting: Rehabilitation

## 2015-06-26 ENCOUNTER — Encounter: Payer: Self-pay | Admitting: Hematology and Oncology

## 2015-06-26 ENCOUNTER — Telehealth: Payer: Self-pay | Admitting: Hematology and Oncology

## 2015-06-26 VITALS — BP 145/68 | HR 61 | Temp 99.5°F | Resp 19 | Ht 71.0 in | Wt 270.0 lb

## 2015-06-26 DIAGNOSIS — N183 Chronic kidney disease, stage 3 unspecified: Secondary | ICD-10-CM | POA: Insufficient documentation

## 2015-06-26 DIAGNOSIS — I5030 Unspecified diastolic (congestive) heart failure: Secondary | ICD-10-CM

## 2015-06-26 DIAGNOSIS — R600 Localized edema: Secondary | ICD-10-CM | POA: Diagnosis not present

## 2015-06-26 DIAGNOSIS — E859 Amyloidosis, unspecified: Secondary | ICD-10-CM

## 2015-06-26 NOTE — Assessment & Plan Note (Signed)
This is likely related to diastolic heart failure and fluid retention. Rather than subject him to more medication, I recommend dietary modification, exercise and weight loss. Clinically, it is improving.

## 2015-06-26 NOTE — Assessment & Plan Note (Signed)
he will continue current medical management. I recommend close follow-up with primary care doctor for medication adjustment.  

## 2015-06-26 NOTE — Progress Notes (Signed)
[facility] OFFICE PROGRESS NOTE  Patient Care Team: Colon Branch, MD as PCP - General (Internal Medicine) Lelon Perla, MD (Cardiology) Heath Lark, MD as Consulting Physician (Hematology and Oncology) Myrle Sheng, MD as Referring Physician (Neurosurgery)  SUMMARY OF ONCOLOGIC HISTORY:  He was diagnosed with systemic amyloidosis after presentation with shortness of breath. He has significant evaluation including bone marrow aspirate and biopsy. 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 The patient subsequently went for maintain dense chemotherapy with Velcade for almost 2 years and then switch to Revlimid. He self discontinue Revlimid in May of 2014 due to fatigue. In July 2015, we restarted him back on Velcade due to a rising light chain. In September 2015, the patient requested to add back dexamethasone at 20 mg every week. In November 2015, the patient reduce dexamethasone to 16 mg every week along with Velcade injection. We discontinued treatment on 11/20/2014  INTERVAL HISTORY: Please see below for problem oriented charting. The patient had traumatic injury to his left lower extremity with significant skin laceration. He was getting aggressive wound care and recent skin graft. The wound is slowly healing. He denies recent infection. Denies new peripheral neuropathy His bilateral leg edema has improved.  REVIEW OF SYSTEMS:   Constitutional: Denies fevers, chills or abnormal weight loss Eyes: Denies blurriness of vision Ears, nose, mouth, throat, and face: Denies mucositis or sore throat Respiratory: Denies cough, dyspnea or wheezes Cardiovascular: Denies palpitation, chest discomfort  Gastrointestinal:  Denies nausea, heartburn or change in  bowel habits Skin: Denies abnormal skin rashes Lymphatics: Denies new lymphadenopathy or easy bruising Neurological:Denies numbness, tingling or new weaknesses Behavioral/Psych: Mood is stable, no new changes  All other systems were reviewed with the patient and are negative.  I have reviewed the past medical history, past surgical history, social history and family history with the patient and they are unchanged from previous note.  ALLERGIES:  has No Known Allergies.  MEDICATIONS:  Current Outpatient Prescriptions  Medication Sig Dispense Refill  . acyclovir (ZOVIRAX) 400 MG tablet Take 1 tablet (400 mg total) by mouth daily. 90 tablet 3  . amiodarone (PACERONE) 200 MG tablet Take 1 tablet (200 mg total) by mouth daily. 90 tablet 3  . furosemide (LASIX) 40 MG tablet Take 1.5 tablets (60 mg total) by mouth daily. 45 tablet 11  . pravastatin (PRAVACHOL) 40 MG tablet Take 1 tablet (40 mg total) by mouth every evening. 90 tablet 3  . Turmeric 500 MG CAPS Take 1,000 mg by mouth daily.    Alveda Reasons 20 MG TABS tablet TAKE ONE TABLET BY MOUTH ONCE DAILY WITH  SUPPER 90 tablet 0   No current facility-administered medications for this visit.    PHYSICAL EXAMINATION: ECOG PERFORMANCE STATUS: 1 - Symptomatic but completely ambulatory  Filed Vitals:   06/26/15 0915  BP: 145/68  Pulse: 61  Temp: 99.5 F (37.5 C)  Resp: 19   Filed Weights   06/26/15 0915  Weight: 270 lb (122.471 kg)    GENERAL:alert, no distress and comfortable. He is morbidly obese SKIN: skin color, texture, turgor are normal, no rashes or significant lesions. The leg laceration is almost completely healed EYES: normal, Conjunctiva are pink and non-injected, sclera clear HEART: He has minor bilateral lower extremity edema Musculoskeletal:no cyanosis of digits and no  clubbing  NEURO: alert & oriented x 3 with fluent speech, no focal motor/sensory deficits  LABORATORY DATA:  I have reviewed the data as listed     Component Value Date/Time   NA 144 06/19/2015 0953   NA 146* 02/25/2015 1638   K 3.9 06/19/2015 0953   K 4.5 02/25/2015 1638   CL 103 02/25/2015 1638   CL 103 05/31/2013 0842   CO2 29 06/19/2015 0953   CO2 30 02/25/2015 1638   GLUCOSE 111 06/19/2015 0953   GLUCOSE 84 02/25/2015 1638   GLUCOSE 118* 05/31/2013 0842   BUN 22.5 06/19/2015 0953   BUN 21 02/25/2015 1638   CREATININE 1.4* 06/19/2015 0953   CREATININE 1.20 02/25/2015 1638   CREATININE 1.41* 10/02/2014 0851   CREATININE 0.92 12/17/2011 1606   CALCIUM 8.9 06/19/2015 0953   CALCIUM 8.9 02/25/2015 1638   PROT 5.9* 06/19/2015 0953   PROT 5.9* 02/25/2015 1638   ALBUMIN 3.2* 06/19/2015 0953   ALBUMIN 3.4* 02/25/2015 1638   AST 17 06/19/2015 0953   AST 14 02/25/2015 1638   ALT 16 06/19/2015 0953   ALT 11 02/25/2015 1638   ALKPHOS 119 06/19/2015 0953   ALKPHOS 85 02/25/2015 1638   BILITOT 0.49 06/19/2015 0953   BILITOT 0.4 02/25/2015 1638   GFRNONAA 63 06/10/2014 1221   GFRNONAA 45* 06/01/2013 0548   GFRAA 73 06/10/2014 1221   GFRAA 52* 06/01/2013 0548    No results found for: SPEP, UPEP  Lab Results  Component Value Date   WBC 8.2 06/19/2015   NEUTROABS 6.6* 06/19/2015   HGB 15.1 06/19/2015   HCT 46.6 06/19/2015   MCV 98.9* 06/19/2015   PLT 200 06/19/2015      Chemistry      Component Value Date/Time   NA 144 06/19/2015 0953   NA 146* 02/25/2015 1638   K 3.9 06/19/2015 0953   K 4.5 02/25/2015 1638   CL 103 02/25/2015 1638   CL 103 05/31/2013 0842   CO2 29 06/19/2015 0953   CO2 30 02/25/2015 1638   BUN 22.5 06/19/2015 0953   BUN 21 02/25/2015 1638   CREATININE 1.4* 06/19/2015 0953   CREATININE 1.20 02/25/2015 1638   CREATININE 1.41* 10/02/2014 0851   CREATININE 0.92 12/17/2011 1606      Component Value Date/Time   CALCIUM 8.9 06/19/2015 0953   CALCIUM 8.9 02/25/2015 1638   ALKPHOS 119 06/19/2015 0953   ALKPHOS 85 02/25/2015 1638   AST 17 06/19/2015 0953   AST 14 02/25/2015 1638   ALT 16  06/19/2015 0953   ALT 11 02/25/2015 1638   BILITOT 0.49 06/19/2015 0953   BILITOT 0.4 02/25/2015 1638       ASSESSMENT & PLAN:  Amyloidosis His light chain studies were elevated but with normal ratio Overall, he has no evidence of organ damage. I reassured the patient that that is no indication to treat right now. I will see him back in 3 months to repeat history, physical examination and blood work.    Pedal edema This is likely related to diastolic heart failure and fluid retention. Rather than subject him to more medication, I recommend dietary modification, exercise and weight loss. Clinically, it is improving.  Chronic kidney disease (CKD), stage III (moderate) he will continue current medical management. I recommend close follow-up with primary care doctor for medication adjustment.    Orders Placed This Encounter  Procedures  . Comprehensive metabolic panel    Standing Status: Future     Number of  Occurrences:      Standing Expiration Date: 07/30/2016  . CBC with Differential/Platelet    Standing Status: Future     Number of Occurrences:      Standing Expiration Date: 07/30/2016   All questions were answered. The patient knows to call the clinic with any problems, questions or concerns. No barriers to learning was detected. I spent 15 minutes counseling the patient face to face. The total time spent in the appointment was 20 minutes and more than 50% was on counseling and review of test results     [facility], [place], MD 06/26/2015 9:53 AM

## 2015-06-26 NOTE — Telephone Encounter (Signed)
s.w. pt and advised on OCT appt....pt ok and aware °

## 2015-06-26 NOTE — Assessment & Plan Note (Signed)
His light chain studies were elevated but with normal ratio Overall, he has no evidence of organ damage. I reassured the patient that that is no indication to treat right now. I will see him back in 3 months to repeat history, physical examination and blood work.

## 2015-07-01 ENCOUNTER — Ambulatory Visit: Payer: Medicare Other | Admitting: Physical Therapy

## 2015-07-03 ENCOUNTER — Telehealth: Payer: Self-pay | Admitting: Cardiology

## 2015-07-03 ENCOUNTER — Ambulatory Visit: Payer: Medicare Other | Admitting: Rehabilitation

## 2015-07-03 NOTE — Telephone Encounter (Signed)
Spoke with pt, he has stopped his xarelto because in the mornings he has mucus in his throat, when he coughs it up, it will have blood in it. After that occurs, there is no more bleeding. Explained to patient the importance of the xarelto related to atrial fib and the risks of being off the medication. He feels the mucus maybe related to post-nasal drip. Encouraged pt to restart the xarelto, he will try flonase or saline nasal spray to help with the post-nasal drip. F/u appointment made for pt to discuss blood thinners with dr [name], at pts request he will call prior to appointment if symptoms change or worsen. Pt agreed with this plan.

## 2015-07-03 NOTE — Telephone Encounter (Signed)
Pt is calling in stating that he has been spitting up some blood. He believes his blood thinner is causing this problem. Please call  Thanks

## 2015-07-08 ENCOUNTER — Ambulatory Visit: Payer: Medicare Other | Admitting: Physical Therapy

## 2015-07-10 ENCOUNTER — Ambulatory Visit: Payer: Medicare Other | Admitting: Rehabilitation

## 2015-07-15 ENCOUNTER — Ambulatory Visit: Payer: Medicare Other | Admitting: Physical Therapy

## 2015-07-17 ENCOUNTER — Ambulatory Visit: Payer: Medicare Other | Admitting: Rehabilitation

## 2015-08-11 ENCOUNTER — Other Ambulatory Visit: Payer: Self-pay | Admitting: Hematology and Oncology

## 2015-08-12 NOTE — Progress Notes (Signed)
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 LHC 10/2009: pLAD 20%, dLAD 30%, mOM2 70-80% (unchanged compared to previous), pRCA 20%, dRCA 20%.  Admitted in February of 2012 with a wide complex tachycardia. Seen by EP and rhythm was most likely felt to be SVT with aberrancy. It was felt that if symptoms recur ablation would be warranted.  Cardiac MRI (09/05/13): Moderate LVE, focal septal hypertrophy, EF 51%, global HK, mild RVE, normal RVSF, no evidence of cardiac amyloid. He has had some increase in his light chains on electrophoresis. This is being followed. Nuclear study in March of 2015 showed apical thinning but no ischemia. Ejection fraction was 48%. Echo 11/15 showed EF 00-86, grade 2 diastolic dysfunction, mild LAE and mildly dilated aortic root. Since last seen, he notes dyspnea on exertion. No orthopnea, PND, chest pain or syncope. He does have pedal edema. He is taking 20 mg of Lasix daily instead of previously prescribed 60 mg.  Current Outpatient Prescriptions  Medication Sig Dispense Refill  . acyclovir (ZOVIRAX) 400 MG tablet Take 1 tablet (400 mg total) by mouth daily. 90 tablet 3  . amiodarone (PACERONE) 200 MG tablet Take 1 tablet (200 mg total) by mouth daily. 90 tablet 3  . furosemide (LASIX) 40 MG tablet Take 1.5 tablets (60 mg total) by mouth daily. (Patient taking differently: Take 20 mg by mouth daily. ) 45 tablet 11  . pravastatin (PRAVACHOL) 40 MG tablet Take 1 tablet (40 mg total) by mouth every evening. 90 tablet 3  . Turmeric 500 MG CAPS Take 1,000 mg by mouth daily.    Alveda Reasons 20 MG TABS tablet TAKE ONE TABLET BY MOUTH ONCE DAILY WITH  SUPPER 90 tablet 0   No current facility-administered medications for this visit.     Past Medical History  Diagnosis Date  . Hypertension   . Coronary atherosclerosis of unspecified type of vessel, native or graft     Non obstructive  . Cerebrovascular  disease, unspecified   . Pure hypercholesterolemia   . Diverticulosis of colon (without mention of hemorrhage)   . Benign neoplasm of colon   . Unspecified hemorrhoids without mention of complication   . Degenerative disc disease   . 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 Hemangioma   . Amyloidosis   . LBBB (left bundle branch block)   . 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 OSA (obstructive sleep apnea)   . Atrial fibrillation   . Cancer     amyloidosis  . Edema 08/29/2013  . Hx of cardiovascular stress test     Adenosine Myoview (02/2014): Apical thinning, no ischemia, EF 48%; low risk.  . Scrotal pain 10/23/2014  . Adenomatous colon polyp 2003    max size 16mm  . Traumatic open wound of left lower leg with delayed healing     Seen at [facility] on 05/29/2015  . 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 Chronic venous insufficiency 2016  . DDD (degenerative disc disease), lumbar     Dr. Katherine Roan    Past Surgical History  Procedure Laterality Date  . Knee arthroscopy    . Carpal tunnel release  08/2006    Dr Doy Mince at [facility] - Bilateral  . 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 Back surgery  2012    [place], [name]  . Cardioversion N/A 07/10/2013    Procedure: CARDIOVERSION;  Surgeon: Lelon Perla, MD;  Location: [facility] ENDOSCOPY;  Service: Cardiovascular;  Laterality: N/A;  . Colonoscopy w/ biopsies      Social History   Social History  . Marital Status: Married    Spouse Name: N/A  . Number of Children: 2  . Years of Education: N/A   Occupational History  . PRESIDENT     [employer]   Social History Main Topics  . Smoking status: Never Smoker   . Smokeless tobacco: Never Used  . Alcohol Use: 0.0 oz/week    0 Standard drinks or equivalent per week     Comment: Occassionally  . Drug Use: No  . Sexual Activity: No   Other Topics  Concern  . Not on file   Social History Narrative   Lives with wife.      ROS: Significant back pain but no fevers or chills, productive cough, hemoptysis, dysphasia, odynophagia, melena, hematochezia, dysuria, hematuria, rash, seizure activity, orthopnea, PND, claudication. Remaining systems are negative.  Physical Exam: Well-developed well-nourished in no acute distress.  Skin is warm and dry.  HEENT is normal.  Neck is supple.  Chest is clear to auscultation with normal expansion.  Cardiovascular exam is regular rate and rhythm.  Abdominal exam nontender or distended. No masses palpated. Extremities show 1+ edema. neuro grossly intact

## 2015-08-13 ENCOUNTER — Ambulatory Visit (INDEPENDENT_AMBULATORY_CARE_PROVIDER_SITE_OTHER): Payer: Medicare Other | Admitting: Cardiology

## 2015-08-13 ENCOUNTER — Encounter: Payer: Self-pay | Admitting: Internal Medicine

## 2015-08-13 ENCOUNTER — Ambulatory Visit (INDEPENDENT_AMBULATORY_CARE_PROVIDER_SITE_OTHER): Payer: Medicare Other | Admitting: Internal Medicine

## 2015-08-13 ENCOUNTER — Encounter: Payer: Self-pay | Admitting: Cardiology

## 2015-08-13 VITALS — BP 155/74 | HR 66 | Ht 71.0 in | Wt 279.1 lb

## 2015-08-13 VITALS — BP 140/78 | HR 69 | Temp 98.0°F | Ht 71.0 in | Wt 279.1 lb

## 2015-08-13 DIAGNOSIS — E7800 Pure hypercholesterolemia, unspecified: Secondary | ICD-10-CM

## 2015-08-13 DIAGNOSIS — M545 Low back pain, unspecified: Secondary | ICD-10-CM

## 2015-08-13 DIAGNOSIS — I2510 Atherosclerotic heart disease of native coronary artery without angina pectoris: Secondary | ICD-10-CM

## 2015-08-13 DIAGNOSIS — I4891 Unspecified atrial fibrillation: Secondary | ICD-10-CM

## 2015-08-13 DIAGNOSIS — I10 Essential (primary) hypertension: Secondary | ICD-10-CM

## 2015-08-13 DIAGNOSIS — I471 Supraventricular tachycardia, unspecified: Secondary | ICD-10-CM

## 2015-08-13 DIAGNOSIS — R0602 Shortness of breath: Secondary | ICD-10-CM | POA: Diagnosis not present

## 2015-08-13 DIAGNOSIS — I5032 Chronic diastolic (congestive) heart failure: Secondary | ICD-10-CM

## 2015-08-13 MED ORDER — HYDROCODONE-ACETAMINOPHEN 5-325 MG PO TABS: 1.0000 | ORAL_TABLET | Freq: Two times a day (BID) | ORAL | Status: DC | PRN

## 2015-08-13 MED ORDER — FUROSEMIDE 20 MG PO TABS: 60.0000 mg | ORAL_TABLET | Freq: Every day | ORAL | Status: DC

## 2015-08-13 NOTE — Assessment & Plan Note (Signed)
Patient is volume overloaded. I have asked him to increase his Lasix to 60 mg daily again. Check bmet and BNP in 1 week. We discussed the importance of fluid restriction, sodium restriction. He will also weigh daily and take an extra 20 mg of Lasix for weight gain of 2 pounds.

## 2015-08-13 NOTE — Assessment & Plan Note (Signed)
Patient remains in sinus rhythm on examination. Continue xarelto. Check renal function. Continue amiodarone. He had liver functions checked recently. Check TSH and chest x-ray.

## 2015-08-13 NOTE — Assessment & Plan Note (Signed)
Blood pressure mildly elevated. We are increasing his Lasix for volume excess. Follow blood pressure and add medications as needed.

## 2015-08-13 NOTE — Assessment & Plan Note (Signed)
Continue amiodarone. 

## 2015-08-13 NOTE — Assessment & Plan Note (Signed)
Continue statin. Not on aspirin given need for anticoagulation. 

## 2015-08-13 NOTE — Assessment & Plan Note (Signed)
Continue statin. 

## 2015-08-13 NOTE — Patient Instructions (Signed)
For pain, take hydrocodone twice a day as needed. Will cause drowsiness.  In between doses, okay to take Tylenol 500 mg: 2 tablets as needed  Please schedule your physical exam, fasting, at your earliest convenience

## 2015-08-13 NOTE — Progress Notes (Signed)
Pre visit review using our clinic review tool, if applicable. No additional management support is needed unless otherwise documented below in the visit note. 

## 2015-08-13 NOTE — Patient Instructions (Signed)
Your physician recommends that you schedule a follow-up appointment in: 3 [place] 60 MG ONCE DAILY= 3 OF THE 20 MG TABLETS ONCE DAILY  Your physician recommends that you return for lab work in: [place]  A chest x-ray takes a picture of the organs and structures inside the chest, including the heart, lungs, and blood vessels. This test can show several things, including, whether the heart is enlarges; whether fluid is building up in the lungs; and whether pacemaker / defibrillator leads are still in place. IN ONE WEEK  Sodium and Fluid Restriction Some health conditions may require you to restrict your sodium and fluid intake. Sodium is part of the salt in the blood. Sodium may be restricted because when you take in a lot of salt, you become thirsty. Limiting salt with help you become less thirsty and may make it easier to restrict fluid. Talk to your caregiver or dietician about how many cups of fluid and how many milligrams of sodium you are allowed each day. If your caregiver has restricted your sodium and fluids, usually the amount you can drink depends on several things, such as:  Your urine output.  How much fluid you are retaining.  Your blood pressure. Every 2 cups (500 mL) of fluid retained in the body becomes an extra 1 pound (0.5 kg) of body weight. The following are examples of some fluids you will have to restrict:  Tea, coffee, soda, lemonade, milk, water, and juice.  Alcoholic beverages.  Cream.  Gravy.  Ice cubes.  Soup and broth. The following are foods that become liquid at room temperature. These foods will count towards your fluid intake.  Ice cream and ice milk.  Frozen yogurt and sherbet.  Frozen ice pops.  Flavored gelatin. YOU MAY BE TAKING IN TOO MUCH FLUID IF:  Your weight increases.  Your face, hands, legs, feet, and abdomen start to swell.  You have trouble breathing. HOME CARE INSTRUCTIONS If you follow a low  sodium diet closely, you will eat approximately 1,500 mg of sodium a day.   Avoid salty foods. This increases your thirst and makes fluid control more difficult. Foods high in sodium include:  Most canned foods, including most meats.  Most processed foods, including most meats.  Cheese.  Dried pasta and rice mixes.  Snack foods (chips, popcorn, pretzels, cheese puffs, salted nuts).  Dips, sauces, and salad dressings.  Do not use salt in cooking or add salt to your meal. Lacinda Axon with herbs and spices, but not those that have salt in the name. Ask your caregiver if it is okay to use salt substitutes.  Eat home-prepared meals. Use fresh ingredients. Avoid canned, frozen, or packaged meals.  Read food labels to see how much sodium is in the food. Know how much sodium you are allowed each day.  When eating out, ask for dressings and sauces on the side.  Weigh yourself every morning with an empty bladder before you eat or drink. If your weight is going up, you are retaining fluid.  Freeze fruit juice or water in an ice cube tray. Use this as part of your fluid allowance.  Brush your teeth often or rinse your mouth with mouthwash to help your dry mouth. Lemon wedges, hard sour candies, chewing gum, or breath spray may help to moisten your mouth too.  Add a slice of fresh lemon or lemon juice to water or ice. This helps satisfy your thirst.  Try frozen  fruits between meals, such as grapes or strawberries.  Swallow your pills along with meals or soft foods. This helps you save your fluid for something you enjoy.  Use small cups and glasses and learn to sip fluids slowly.  Keep your home cooler. Keep the air in your home as humid as possible. Dry air increases thirst.  Avoid being out in the hot sun. Each morning, fill a jug with the amount of water you are allowed for the day. You can use this water as a guideline for fluid allowance. Each time you take in fluid, pour an equal amount of  water out of the container. This helps you to see how much fluid you are taking in. It also helps plan your fluid intake for the rest of the day. CONVERSIONS TO HELP MEASURE FLUID INTAKE  1 cup equals 8 oz (240 mL).   cup equals 6 oz (180 mL).   cup equals 5  oz (160 mL).   cup equals 4 oz (120 mL).   cup equals 2  oz (80 mL).   cup equals 2 oz (60 mL).  2 tbs equals 1 oz (30 mL). Document Released: 09/26/2007 Document Revised: 05/30/2012 Document Reviewed: 05/12/2012 [facility] Patient Information 2015 [place], [place]. This information is not intended to replace advice given to you by your health care provider. Make sure you discuss any questions you have with your health care provider.

## 2015-08-13 NOTE — Progress Notes (Signed)
Subjective:    Patient ID: [name], male    DOB: [birth year], 74 y.o.   MRN: [id number]  DOS:  08/13/2015 Type of visit - description : Acute Interval history: Continue with back pain, since the last time I saw him, he did some physical therapy, it didn't help much. Took  prednisone and it helped temporarily. The pain is on the lower bilateral back, worse when he stands up.   Review of Systems  Denies fever chills No lower extremity paresthesias No bladder or bowel incontinence.  Past Medical History  Diagnosis Date  . Hypertension   . Coronary atherosclerosis of unspecified type of vessel, native or graft     Non obstructive  . Cerebrovascular disease, unspecified   . Pure hypercholesterolemia   . Diverticulosis of colon (without mention of hemorrhage)   . Benign neoplasm of colon   . Unspecified hemorrhoids without mention of complication   . Degenerative disc disease   . 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 Hemangioma   . Amyloidosis   . LBBB (left bundle branch block)   . 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 OSA (obstructive sleep apnea)   . Atrial fibrillation   . Cancer     amyloidosis  . Edema 08/29/2013  . Hx of cardiovascular stress test     Adenosine Myoview (02/2014): Apical thinning, no ischemia, EF 48%; low risk.  . Scrotal pain 10/23/2014  . Adenomatous colon polyp 2003    max size 68mm  . Traumatic open wound of left lower leg with delayed healing     [facility] 05/29/2015  . 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 Chronic venous insufficiency 2016  . DDD (degenerative disc disease), lumbar     Dr. Katherine Roan    Past Surgical History  Procedure Laterality Date  . Knee arthroscopy    . Carpal tunnel release  08/2006    [facility] Doy Mince at [facility] - Bilateral  . 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 Back surgery  2012     Lumbar, [name]   . Cardioversion N/A 07/10/2013    Procedure: CARDIOVERSION;  Surgeon: Lelon Perla, MD;  Location: [facility] ENDOSCOPY;  Service: Cardiovascular;  Laterality: N/A;  . Colonoscopy w/ biopsies      Social History   Social History  . Marital Status: Married    Spouse Name: N/A  . Number of Children: 2  . Years of Education: N/A   Occupational History  . PRESIDENT     [employer]   Social History Main Topics  . Smoking status: Never Smoker   . Smokeless tobacco: Never Used  . Alcohol Use: 0.0 oz/week    0 Standard drinks or equivalent per week     Comment: Occassionally  . Drug Use: No  . Sexual Activity: No   Other Topics Concern  . Not on file   Social History Narrative   Lives with wife.          Medication List       This list is accurate as of: 08/13/15 11:59 PM.  Always use your most recent med list.               acyclovir 400 MG tablet  Commonly known as:  ZOVIRAX  Take 1 tablet (400 mg total) by mouth  daily.     amiodarone 200 MG tablet  Commonly known as:  PACERONE  Take 1 tablet (200 mg total) by mouth daily.     furosemide 20 MG tablet  Commonly known as:  LASIX  Take 3 tablets (60 mg total) by mouth daily.     HYDROcodone-acetaminophen 5-325 MG per tablet  Commonly known as:  NORCO/VICODIN  Take 1 tablet by mouth 2 (two) times daily as needed.     pravastatin 40 MG tablet  Commonly known as:  PRAVACHOL  Take 1 tablet (40 mg total) by mouth every evening.     Turmeric 500 MG Caps  Take 1,000 mg by mouth daily.     XARELTO 20 MG Tabs tablet  Generic drug:  rivaroxaban  TAKE ONE TABLET BY MOUTH ONCE DAILY WITH  SUPPER           Objective:   Physical Exam BP 140/78 mmHg  Pulse 69  Temp(Src) 98 F (36.7 C) (Oral)  Ht 5\' 11"  (1.803 m)  Wt 279 lb 1 oz (126.582 kg)  BMI 38.94 kg/m2  SpO2 98% General:   Well developed, well nourished . NAD.  HEENT:  Normocephalic . Face symmetric, atraumatic MSK:  No TTP at the back Skin: Not pale. Not jaundice Neurologic:  alert & oriented X3.  Speech normal, gait appropriate for age and unassisted. DTRs symmetric, straight leg test negative. Psych--  Cognition and judgment appear intact.  Cooperative with normal attention span and concentration.  Behavior appropriate. No anxious or depressed appearing.      Assessment & Plan:

## 2015-08-14 NOTE — Assessment & Plan Note (Signed)
Ongoing back pain We discuss pain management referral but he's not interested, states that he is now willing to take pain medication and is okay if i  prescribe it. We'll try a low dose of hydrocortisone, risk of somnolence and addiction discuss. See instructions

## 2015-09-22 ENCOUNTER — Other Ambulatory Visit (HOSPITAL_BASED_OUTPATIENT_CLINIC_OR_DEPARTMENT_OTHER): Payer: Medicare Other

## 2015-09-22 DIAGNOSIS — E859 Amyloidosis, unspecified: Secondary | ICD-10-CM | POA: Diagnosis not present

## 2015-09-22 LAB — CBC WITH DIFFERENTIAL/PLATELET
BASO%: 2.8 % — ABNORMAL HIGH (ref 0.0–2.0)
Basophils Absolute: 0.2 10*3/uL — ABNORMAL HIGH (ref 0.0–0.1)
EOS ABS: 0.1 10*3/uL (ref 0.0–0.5)
EOS%: 1.5 % (ref 0.0–7.0)
HCT: 41.5 % (ref 38.4–49.9)
HGB: 13.5 g/dL (ref 13.0–17.1)
LYMPH#: 0.9 10*3/uL (ref 0.9–3.3)
LYMPH%: 14.3 % (ref 14.0–49.0)
MCH: 31.7 pg (ref 27.2–33.4)
MCHC: 32.6 g/dL (ref 32.0–36.0)
MCV: 97.5 fL (ref 79.3–98.0)
MONO#: 0.8 10*3/uL (ref 0.1–0.9)
MONO%: 12 % (ref 0.0–14.0)
NEUT%: 69.4 % (ref 39.0–75.0)
NEUTROS ABS: 4.6 10*3/uL (ref 1.5–6.5)
PLATELETS: 236 10*3/uL (ref 140–400)
RBC: 4.26 10*6/uL (ref 4.20–5.82)
RDW: 15.4 % — ABNORMAL HIGH (ref 11.0–14.6)
WBC: 6.6 10*3/uL (ref 4.0–10.3)

## 2015-09-22 LAB — COMPREHENSIVE METABOLIC PANEL (CC13)
ALT: 12 U/L (ref 0–55)
AST: 16 U/L (ref 5–34)
Albumin: 3.4 g/dL — ABNORMAL LOW (ref 3.5–5.0)
Alkaline Phosphatase: 106 U/L (ref 40–150)
Anion Gap: 7 mEq/L (ref 3–11)
BUN: 20.5 mg/dL (ref 7.0–26.0)
CHLORIDE: 106 meq/L (ref 98–109)
CO2: 30 mEq/L — ABNORMAL HIGH (ref 22–29)
Calcium: 9.2 mg/dL (ref 8.4–10.4)
Creatinine: 1.2 mg/dL (ref 0.7–1.3)
EGFR: 56 mL/min/{1.73_m2} — ABNORMAL LOW (ref >90)
GLUCOSE: 92 mg/dL (ref 70–140)
POTASSIUM: 4 meq/L (ref 3.5–5.1)
SODIUM: 142 meq/L (ref 136–145)
Total Bilirubin: 0.54 mg/dL (ref 0.20–1.20)
Total Protein: 6.6 g/dL (ref 6.4–8.3)

## 2015-09-23 ENCOUNTER — Other Ambulatory Visit: Payer: Self-pay | Admitting: Cardiology

## 2015-09-24 LAB — SPEP & IFE WITH QIG
Albumin ELP: 3.5 g/dL — ABNORMAL LOW (ref 3.8–4.8)
Alpha-1-Globulin: 0.3 g/dL (ref 0.2–0.3)
Alpha-2-Globulin: 0.9 g/dL (ref 0.5–0.9)
BETA GLOBULIN: 0.4 g/dL (ref 0.4–0.6)
Beta 2: 0.4 g/dL (ref 0.2–0.5)
Gamma Globulin: 1 g/dL (ref 0.8–1.7)
IGA: 213 mg/dL (ref 68–379)
IgG (Immunoglobin G), Serum: 1090 mg/dL (ref 650–1600)
IgM, Serum: 40 mg/dL — ABNORMAL LOW (ref 41–251)
Total Protein, Serum Electrophoresis: 6.4 g/dL (ref 6.1–8.1)

## 2015-09-24 LAB — KAPPA/LAMBDA LIGHT CHAINS
KAPPA LAMBDA RATIO: 3.17 — AB (ref 0.26–1.65)
Kappa free light chain: 5.93 mg/dL — ABNORMAL HIGH (ref 0.33–1.94)
Lambda Free Lght Chn: 1.87 mg/dL (ref 0.57–2.63)

## 2015-09-27 DIAGNOSIS — J310 Chronic rhinitis: Secondary | ICD-10-CM | POA: Diagnosis not present

## 2015-09-27 DIAGNOSIS — R05 Cough: Secondary | ICD-10-CM | POA: Diagnosis not present

## 2015-09-29 ENCOUNTER — Telehealth: Payer: Self-pay | Admitting: *Deleted

## 2015-09-29 ENCOUNTER — Ambulatory Visit (HOSPITAL_BASED_OUTPATIENT_CLINIC_OR_DEPARTMENT_OTHER): Payer: Medicare Other | Admitting: Hematology and Oncology

## 2015-09-29 ENCOUNTER — Encounter: Payer: Self-pay | Admitting: Hematology and Oncology

## 2015-09-29 ENCOUNTER — Telehealth: Payer: Self-pay | Admitting: Hematology and Oncology

## 2015-09-29 VITALS — BP 165/69 | HR 67 | Temp 98.1°F | Resp 18 | Ht 71.0 in | Wt 269.2 lb

## 2015-09-29 DIAGNOSIS — R600 Localized edema: Secondary | ICD-10-CM

## 2015-09-29 DIAGNOSIS — E8589 Other amyloidosis: Secondary | ICD-10-CM

## 2015-09-29 DIAGNOSIS — E858 Other amyloidosis: Secondary | ICD-10-CM | POA: Diagnosis not present

## 2015-09-29 DIAGNOSIS — R05 Cough: Secondary | ICD-10-CM | POA: Insufficient documentation

## 2015-09-29 DIAGNOSIS — R053 Chronic cough: Secondary | ICD-10-CM

## 2015-09-29 HISTORY — DX: Chronic cough: R05.3

## 2015-09-29 MED ORDER — HYDROCODONE-HOMATROPINE 5-1.5 MG/5ML PO SYRP: 5.0000 mL | ORAL_SOLUTION | Freq: Four times a day (QID) | ORAL | Status: DC | PRN

## 2015-09-29 NOTE — Progress Notes (Signed)
[place] OFFICE PROGRESS NOTE  Patient Care Team: Colon Branch, MD as PCP - General (Internal Medicine) Lelon Perla, MD (Cardiology) Heath Lark, MD as Consulting Physician (Hematology and Oncology) Myrle Sheng, MD as Referring Physician (Neurosurgery)  SUMMARY OF ONCOLOGIC HISTORY:  He was diagnosed with systemic amyloidosis after presentation with shortness of breath. He has significant evaluation including bone marrow aspirate and biopsy. 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 The patient subsequently went for maintain dense chemotherapy with Velcade for almost 2 years and then switch to Revlimid. He self discontinue Revlimid in May of 2014 due to fatigue. In July 2015, we restarted him back on Velcade due to a rising light chain. In September 2015, the patient requested to add back dexamethasone at 20 mg every week. In November 2015, the patient reduce dexamethasone to 16 mg every week along with Velcade injection. We discontinued treatment on 11/20/2014  INTERVAL HISTORY: Please see below for problem oriented charting. He feels well. Denies recent infection. He continues to have bilateral lower extremity edema. he haasrecent nonproductive cough. Denies fevers or chills.  REVIEW OF SYSTEMS:   Constitutional: Denies fevers, chills or abnormal weight loss Eyes: Denies blurriness of vision Ears, nose, mouth, throat, and face: Denies mucositis or sore throat Cardiovascular: Denies palpitation, chest discomfort Gastrointestinal:  Denies nausea, heartburn or change in bowel habits Skin: Denies abnormal skin rashes Lymphatics: Denies new lymphadenopathy or easy bruising Neurological:Denies numbness, tingling or new  weaknesses Behavioral/Psych: Mood is stable, no new changes  All other systems were reviewed with the patient and are negative.  I have reviewed the past medical history, past surgical history, social history and family history with the patient and they are unchanged from previous note.  ALLERGIES:  has No Known Allergies.  MEDICATIONS:  Current Outpatient Prescriptions  Medication Sig Dispense Refill  . acyclovir (ZOVIRAX) 400 MG tablet Take 1 tablet (400 mg total) by mouth daily. 90 tablet 3  . amiodarone (PACERONE) 200 MG tablet Take 1 tablet (200 mg total) by mouth daily. 90 tablet 3  . furosemide (LASIX) 20 MG tablet Take 3 tablets (60 mg total) by mouth daily. 270 tablet 3  . montelukast (SINGULAIR) 10 MG tablet Take 10 mg by mouth daily.    . pravastatin (PRAVACHOL) 40 MG tablet Take 1 tablet (40 mg total) by mouth every evening. 90 tablet 3  . Turmeric 500 MG CAPS Take 1,000 mg by mouth daily.    Alveda Reasons 20 MG TABS tablet TAKE ONE TABLET BY MOUTH ONCE DAILY WITH  SUPPER 90 tablet 1  . HYDROcodone-homatropine (HYCODAN) 5-1.5 MG/5ML syrup Take 5 mLs by mouth every 6 (six) hours as needed for cough. 120 mL 0   No current facility-administered medications for this visit.    PHYSICAL EXAMINATION: ECOG PERFORMANCE STATUS: 1 - Symptomatic but completely ambulatory  Filed Vitals:   09/29/15 0912  BP: 165/69  Pulse: 67  Temp: 98.1 F (36.7 C)  Resp: 18   Filed Weights   09/29/15 0912  Weight: 269 lb 3.2 oz (122.108 kg)    GENERAL:alert, no distress and comfortable. He is obese SKIN: skin color, texture, turgor are normal, no rashes or significant lesions EYES: normal, Conjunctiva are pink and non-injected, sclera clear HEART:Hehas mild bilateral lower extremity edema Musculoskeletal:no cyanosis of digits  and no clubbing  NEURO: alert & oriented x 3 with fluent speech, no focal motor/sensory deficits  LABORATORY DATA:  I have reviewed the data as listed    Component  Value Date/Time   NA 142 09/22/2015 0830   NA 146* 02/25/2015 1638   K 4.0 09/22/2015 0830   K 4.5 02/25/2015 1638   CL 103 02/25/2015 1638   CL 103 05/31/2013 0842   CO2 30* 09/22/2015 0830   CO2 30 02/25/2015 1638   GLUCOSE 92 09/22/2015 0830   GLUCOSE 84 02/25/2015 1638   GLUCOSE 118* 05/31/2013 0842   BUN 20.5 09/22/2015 0830   BUN 21 02/25/2015 1638   CREATININE 1.2 09/22/2015 0830   CREATININE 1.20 02/25/2015 1638   CREATININE 1.41* 10/02/2014 0851   CREATININE 0.92 12/17/2011 1606   CALCIUM 9.2 09/22/2015 0830   CALCIUM 8.9 02/25/2015 1638   PROT 6.6 09/22/2015 0830   PROT 5.9* 02/25/2015 1638   ALBUMIN 3.4* 09/22/2015 0830   ALBUMIN 3.4* 02/25/2015 1638   AST 16 09/22/2015 0830   AST 14 02/25/2015 1638   ALT 12 09/22/2015 0830   ALT 11 02/25/2015 1638   ALKPHOS 106 09/22/2015 0830   ALKPHOS 85 02/25/2015 1638   BILITOT 0.54 09/22/2015 0830   BILITOT 0.4 02/25/2015 1638   GFRNONAA 63 06/10/2014 1221   GFRNONAA 45* 06/01/2013 0548   GFRAA 73 06/10/2014 1221   GFRAA 52* 06/01/2013 0548    No results found for: SPEP, UPEP  Lab Results  Component Value Date   WBC 6.6 09/22/2015   NEUTROABS 4.6 09/22/2015   HGB 13.5 09/22/2015   HCT 41.5 09/22/2015   MCV 97.5 09/22/2015   PLT 236 09/22/2015      Chemistry      Component Value Date/Time   NA 142 09/22/2015 0830   NA 146* 02/25/2015 1638   K 4.0 09/22/2015 0830   K 4.5 02/25/2015 1638   CL 103 02/25/2015 1638   CL 103 05/31/2013 0842   CO2 30* 09/22/2015 0830   CO2 30 02/25/2015 1638   BUN 20.5 09/22/2015 0830   BUN 21 02/25/2015 1638   CREATININE 1.2 09/22/2015 0830   CREATININE 1.20 02/25/2015 1638   CREATININE 1.41* 10/02/2014 0851   CREATININE 0.92 12/17/2011 1606      Component Value Date/Time   CALCIUM 9.2 09/22/2015 0830   CALCIUM 8.9 02/25/2015 1638   ALKPHOS 106 09/22/2015 0830   ALKPHOS 85 02/25/2015 1638   AST 16 09/22/2015 0830   AST 14 02/25/2015 1638   ALT 12 09/22/2015 0830    ALT 11 02/25/2015 1638   BILITOT 0.54 09/22/2015 0830   BILITOT 0.4 02/25/2015 1638     ASSESSMENT & PLAN:  Amyloidosis His light chain studies show mildly elevated kappa light chain Overall, he has no evidence of organ damage. I reassured the patient that that is no indication to treat right now. I will see him back in 4 months to repeat history, physical examination and blood work.      Cough, persistent He has chronic persistent cough of unknown etiology. He denies productive cough, fevers or sore throat He requests prescription of cough syrup. I will order that  one time but if his cough does not get better within a week, he needs to see his primary care doctor for further evaluation and management.  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 He is not compliant using compression hose and has been sedentary on a daily  basis without much activity. I recommend graduated exercise as tolerated The patient is also advised to lose weight.   Orders Placed This Encounter  Procedures  . Comprehensive metabolic panel    Standing Status: Future     Number of Occurrences:      Standing Expiration Date: 11/02/2016  . CBC with Differential/Platelet    Standing Status: Future     Number of Occurrences:      Standing Expiration Date: 11/02/2016  . SPEP & IFE with QIG    Standing Status: Future     Number of Occurrences:      Standing Expiration Date: 11/02/2016  . Kappa/lambda light chains    Standing Status: Future     Number of Occurrences:      Standing Expiration Date: 11/02/2016   All questions were answered. The patient knows to call the clinic with any problems, questions or concerns. No barriers to learning was detected. I spent 15 minutes counseling the patient face to face. The total time spent in the appointment was 20 minutes and more than 50% was on counseling and review of test results     [facility], [place], MD 09/29/2015 11:07  AM

## 2015-09-29 NOTE — Telephone Encounter (Signed)
Called and ledft a message with her appointments

## 2015-09-29 NOTE — Assessment & Plan Note (Signed)
His light chain studies show mildly elevated kappa light chain Overall, he has no evidence of organ damage. I reassured the patient that that is no indication to treat right now. I will see him back in 4 months to repeat history, physical examination and blood work.

## 2015-09-29 NOTE — Assessment & Plan Note (Signed)
He has chronic persistent cough of unknown etiology. He denies productive cough, fevers or sore throat He requests prescription of cough syrup. I will order that  one time but if his cough does not get better within a week, he needs to see his primary care doctor for further evaluation and management.

## 2015-09-29 NOTE — Telephone Encounter (Signed)
LVM for pt informing him of Rx for Cough Syrup ready to pick up.  I went out to lobby to look for him but could not find him.  Informed pt on VM that Rx is narcotic and nurse cannot call into his pharmacy.  He will need to come back to our office to pick it up.

## 2015-09-29 NOTE — Assessment & Plan Note (Signed)
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 He is not compliant using compression hose and has been sedentary on a daily basis without much activity. I recommend graduated exercise as tolerated The patient is also advised to lose weight.

## 2015-10-03 ENCOUNTER — Other Ambulatory Visit: Payer: Self-pay | Admitting: *Deleted

## 2015-10-03 DIAGNOSIS — R053 Chronic cough: Secondary | ICD-10-CM

## 2015-10-03 DIAGNOSIS — R05 Cough: Secondary | ICD-10-CM

## 2015-10-03 MED ORDER — HYDROCODONE-HOMATROPINE 5-1.5 MG/5ML PO SYRP: 5.0000 mL | ORAL_SOLUTION | Freq: Four times a day (QID) | ORAL | Status: DC | PRN

## 2015-10-03 NOTE — Telephone Encounter (Signed)
TC from pt requesting refill on his Hycodan cough syrup. Last filled for 120 mls on 09/29/15. Pt states it is helpful but does not have enough to get through the weekend. Selena Lesser, NP to refill for Dr. Alvy Bimler. Pt on is way to pick up prescription.

## 2015-10-18 DIAGNOSIS — Z23 Encounter for immunization: Secondary | ICD-10-CM | POA: Diagnosis not present

## 2015-10-20 ENCOUNTER — Telehealth: Payer: Self-pay | Admitting: Cardiology

## 2015-10-20 NOTE — Telephone Encounter (Signed)
Pt returned call. States "excessive shortness of breath". Demanded to be seen today by Dr. [name]. Explained Dr. [name] not in office, he asked if he was in [place] / [place], I stated no. Asked if anyone else in office. Offered PA/NP if available (need to check w [facility] [employer] for openings on flex). Pt flatly declined extender slot. Stated refusal to be seen by extender. At time of call, no [facility] physician slot available for work-in. This was communicated to patient.  I advised that if SOB excessive and he is having legitimate concerns, ER evaluation should be considered.  Pt stated refusal to be seen in ER. Pt does not sound audibly SOB on phone, but he refused to give further details regarding his condition. Pt stated "have Debra call me" - hung up phone before I could inform him Hilda Blades is out of office today.  Attempted to call back - no answer when dialed.

## 2015-10-20 NOTE — Telephone Encounter (Signed)
Returned call to patient. Informed him Hilda Blades out of office - asked if I can assist further. Pt still refuses PA or NP visit - refused ER - would not state explicitly what concerns were, only notes SOB worse today than when he called to set up initial appt w/ Dr. [name] on 11/16. Explained that ER may still be best option for him and discussed symptoms appropriate for ER.  He asked if any physician could see him. No openings today. Did offer on opening on Dr. Doug Sou schedule for tomorrow. Routing to [place] for [employer].

## 2015-10-20 NOTE — Telephone Encounter (Signed)
LMOM for patient to call.  Note pt has upcoming appt 11/16 w/ Dr. [name].

## 2015-10-20 NOTE — Telephone Encounter (Signed)
Patient states he has had shortness of breath this past weekend, he also has pain in his left leg and has some dizziness.  Please call.

## 2015-10-21 ENCOUNTER — Ambulatory Visit (INDEPENDENT_AMBULATORY_CARE_PROVIDER_SITE_OTHER): Payer: Medicare Other | Admitting: Cardiology

## 2015-10-21 ENCOUNTER — Encounter: Payer: Self-pay | Admitting: Cardiology

## 2015-10-21 ENCOUNTER — Ambulatory Visit
Admission: RE | Admit: 2015-10-21 | Discharge: 2015-10-21 | Disposition: A | Discharge status: Home / Self Care | Payer: Medicare Other | Source: Ambulatory Visit | Attending: Cardiology | Admitting: Cardiology

## 2015-10-21 VITALS — BP 122/88 | HR 64 | Ht 71.0 in | Wt 270.0 lb

## 2015-10-21 DIAGNOSIS — I471 Supraventricular tachycardia, unspecified: Secondary | ICD-10-CM

## 2015-10-21 DIAGNOSIS — I509 Heart failure, unspecified: Secondary | ICD-10-CM | POA: Diagnosis not present

## 2015-10-21 DIAGNOSIS — E858 Other amyloidosis: Secondary | ICD-10-CM | POA: Diagnosis not present

## 2015-10-21 DIAGNOSIS — N183 Chronic kidney disease, stage 3 unspecified: Secondary | ICD-10-CM

## 2015-10-21 DIAGNOSIS — I2510 Atherosclerotic heart disease of native coronary artery without angina pectoris: Secondary | ICD-10-CM

## 2015-10-21 DIAGNOSIS — I10 Essential (primary) hypertension: Secondary | ICD-10-CM

## 2015-10-21 DIAGNOSIS — I4891 Unspecified atrial fibrillation: Secondary | ICD-10-CM | POA: Diagnosis not present

## 2015-10-21 DIAGNOSIS — R0602 Shortness of breath: Secondary | ICD-10-CM | POA: Diagnosis not present

## 2015-10-21 DIAGNOSIS — E8589 Other amyloidosis: Secondary | ICD-10-CM

## 2015-10-21 DIAGNOSIS — I480 Paroxysmal atrial fibrillation: Secondary | ICD-10-CM

## 2015-10-21 DIAGNOSIS — I5033 Acute on chronic diastolic (congestive) heart failure: Secondary | ICD-10-CM | POA: Diagnosis not present

## 2015-10-21 MED ORDER — AMIODARONE HCL 200 MG PO TABS: 100.0000 mg | ORAL_TABLET | Freq: Every day | ORAL | Status: DC

## 2015-10-21 NOTE — Patient Instructions (Signed)
Reduce amiodarone to 100 mg daily  Continue lasix 60 mg daily  We will check blood work today and get a chest X ray.   Keep your appointment with Dr. [name] next week

## 2015-10-21 NOTE — Telephone Encounter (Signed)
Pt seen by dr Martinique

## 2015-10-22 LAB — TSH: TSH: 1.738 u[IU]/mL (ref 0.350–4.500)

## 2015-10-22 LAB — BRAIN NATRIURETIC PEPTIDE: BRAIN NATRIURETIC PEPTIDE: 126.4 pg/mL — AB (ref 0.0–100.0)

## 2015-10-22 NOTE — Progress Notes (Signed)
HPI: Johnny Mitchell is seen as a walk in today for evaluation of dyspnea. He is a patient of Dr. [name]. 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 LHC 10/2009: pLAD 20%, dLAD 30%, mOM2 70-80% (unchanged compared to previous), pRCA 20%, dRCA 20%.  Admitted in February of 2012 with a wide complex tachycardia. Seen by EP and rhythm was most likely felt to be SVT with aberrancy. It was felt that if symptoms recur ablation would be warranted.  Cardiac MRI (09/05/13): Moderate LVE, focal septal hypertrophy, EF 51%, global HK, mild RVE, normal RVSF, no evidence of cardiac amyloid.  This is being followed. Nuclear study in March of 2015 showed apical thinning but no ischemia. Ejection fraction was 48%. Echo 11/15 showed EF 53-66, grade 2 diastolic dysfunction, mild LAE and mildly dilated aortic root. He is followed by heme-onc for his amyloid and this has been stable.   Today he complains of symptoms of increased dyspnea. He states this has been going on for several months. When seen by Dr. [name] at the end of August it was noted that he had decreased his lasix from 60 mg to 20 mg daily. It was recommended he increase this. CXR and BNP ordered but he never had this done. He states he has been taking 60 mg now. Our scales indicate a 9-10 lb weight loss but he states his weight at home is unchanged. He does have some chronic ankle edema. 2-3 weeks ago he developed a cough and sore throat. This has improved but has not gone completely away. He was taking Hydrocodone for cough. 3 days ago he developed symptoms of dizziness with some vertigo and imbalance. No palpitations. He has been on amiodarone for over 2 years without recurrent arrhythmia. He is concerned about potential toxicity.  Current Outpatient Prescriptions  Medication Sig Dispense Refill  . acyclovir (ZOVIRAX) 400 MG tablet Take 1 tablet (400 mg total) by mouth daily.  90 tablet 3  . amiodarone (PACERONE) 200 MG tablet Take 0.5 tablets (100 mg total) by mouth daily. 90 tablet 3  . furosemide (LASIX) 20 MG tablet Take 3 tablets (60 mg total) by mouth daily. 270 tablet 3  . HYDROcodone-homatropine (HYCODAN) 5-1.5 MG/5ML syrup Take 5 mLs by mouth every 6 (six) hours as needed for cough. 120 mL 0  . pravastatin (PRAVACHOL) 40 MG tablet Take 1 tablet (40 mg total) by mouth every evening. 90 tablet 3  . Turmeric 500 MG CAPS Take 1,000 mg by mouth daily.    Alveda Reasons 20 MG TABS tablet TAKE ONE TABLET BY MOUTH ONCE DAILY WITH  SUPPER 90 tablet 1   No current facility-administered medications for this visit.     Past Medical History  Diagnosis Date  . Hypertension   . Coronary atherosclerosis of unspecified type of vessel, native or graft     Non obstructive  . Cerebrovascular disease, unspecified   . Pure hypercholesterolemia   . Diverticulosis of colon (without mention of hemorrhage)   . Benign neoplasm of colon   . Unspecified hemorrhoids without mention of complication   . Degenerative disc disease   . 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 Hemangioma   . Amyloidosis   . LBBB (left bundle branch block)   . Wide-complex tachycardia ([place]) 01/2011    Felt likely be SVT with abbarancy  . OSA (obstructive sleep apnea)   .  Atrial fibrillation ([place])   . Cancer (HCC)     amyloidosis  . Edema 08/29/2013  . Hx of cardiovascular stress test     Adenosine Myoview (02/2014): Apical thinning, no ischemia, EF 48%; low risk.  . Scrotal pain 10/23/2014  . Adenomatous colon polyp 2003    max size 79mm  . Traumatic open wound of left lower leg with delayed healing     Seen at [facility] on 05/29/2015  . 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 Chronic venous insufficiency 2016  . DDD (degenerative disc disease), lumbar     Dr. Katherine Roan  . Cough, persistent 09/29/2015     Past Surgical History  Procedure Laterality Date  . Knee arthroscopy    . Carpal tunnel release  08/2006    Dr Doy Mince at [facility] - Bilateral  . 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 Back surgery  2012    Lumbar, [name]  . Cardioversion N/A 07/10/2013    Procedure: CARDIOVERSION;  Surgeon: Lelon Perla, MD;  Location: [facility] ENDOSCOPY;  Service: Cardiovascular;  Laterality: N/A;  . Colonoscopy w/ biopsies      Social History   Social History  . Marital Status: Married    Spouse Name: N/A  . Number of Children: 2  . Years of Education: N/A   Occupational History  . PRESIDENT     [employer]   Social History Main Topics  . Smoking status: Never Smoker   . Smokeless tobacco: Never Used  . Alcohol Use: 0.0 oz/week    0 Standard drinks or equivalent per week     Comment: Occassionally  . Drug Use: No  . Sexual Activity: No   Other Topics Concern  . Not on file   Social History Narrative   Lives with wife.      ROS: As noted in HPI.  Remaining systems are negative.  Physical Exam: Well-developed obese in no acute distress.  Skin is warm and dry.  HEENT is normal.  Neck is supple.  Chest is clear to auscultation with normal expansion.  Cardiovascular exam is regular rate and rhythm.  Abdominal exam nontender or distended. No masses palpated. Extremities show 1+ edema. neuro grossly intact  Lab Results  Component Value Date   WBC 6.6 09/22/2015   HGB 13.5 09/22/2015   HCT 41.5 09/22/2015   PLT 236 09/22/2015   GLUCOSE 92 09/22/2015   CHOL 155 01/15/2009   TRIG 77 01/15/2009   HDL 39.9 01/15/2009   LDLCALC 100* 01/15/2009   ALT 12 09/22/2015   AST 16 09/22/2015   NA 142 09/22/2015   K 4.0 09/22/2015   CL 103 02/25/2015   CREATININE 1.2 09/22/2015   BUN 20.5 09/22/2015   CO2 30* 09/22/2015   TSH 1.738 10/21/2015   PSA 0.81 01/15/2009   INR 1.02 09/30/2011     1. Chronic dyspnea. I suspect this is related to diastolic CHF. Some edema  on exam. Recommend following through with CXR, BNP level today. If abnormal would increase diuretics. Reviewed recommendations for sodium restriction. Other contributing factors are obesity. I doubt he has amiodarone toxicity.   2. Paroxysmal Afib and history of SVT. Given the fact that he has had no arrhythmia in 2 years it would be reasonable to reduce amiodarone now. Will reduce to 100 mg daily. Check TSH.   3. Dizziness/imbalance. Suspect some vertigo related to recent  URI. Symptoms improving. No specific therapy needed. I instructed him not to take hydrocodone for cough. OTC cough meds would be more appropriate.  Will follow up with Dr. [name] next week.

## 2015-10-22 NOTE — Progress Notes (Signed)
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 LHC 10/2009: pLAD 20%, dLAD 30%, mOM2 70-80% (unchanged compared to previous), pRCA 20%, dRCA 20%.  Admitted in February of 2012 with a wide complex tachycardia. Seen by EP and rhythm was most likely felt to be SVT with aberrancy. It was felt that if symptoms recur ablation would be warranted.  Cardiac MRI (09/05/13): Moderate LVE, focal septal hypertrophy, EF 51%, global HK, mild RVE, normal RVSF, no evidence of cardiac amyloid. Nuclear study in March of 2015 showed apical thinning but no ischemia. Ejection fraction was 48%. Echo 11/15 showed EF 11-94, grade 2 diastolic dysfunction, mild LAE and mildly dilated aortic root. Patient seen November 8 by Dr. Martinique for increased dyspnea. Amiodarone decreased to 100 mg daily. Chest x-ray showed low-grade CHF with mild interstitial edema. BNP 126. Since last seen, He complains of dyspnea on exertion. No orthopnea, PND, chest pain or syncope. Mild pedal edema.  Current Outpatient Prescriptions  Medication Sig Dispense Refill  . acyclovir (ZOVIRAX) 400 MG tablet Take 1 tablet (400 mg total) by mouth daily. 90 tablet 3  . amiodarone (PACERONE) 200 MG tablet Take 0.5 tablets (100 mg total) by mouth daily. 90 tablet 3  . furosemide (LASIX) 20 MG tablet Take 3 tablets ( 60 mg ) twice a day for 4 days only then 3 tablets ( 60 mg ) daily 108 tablet 3  . HYDROcodone-homatropine (HYCODAN) 5-1.5 MG/5ML syrup Take 5 mLs by mouth every 6 (six) hours as needed for cough. 120 mL 0  . pravastatin (PRAVACHOL) 40 MG tablet Take 1 tablet (40 mg total) by mouth every evening. 90 tablet 3  . Turmeric 500 MG CAPS Take 1,000 mg by mouth daily.    Alveda Reasons 20 MG TABS tablet TAKE ONE TABLET BY MOUTH ONCE DAILY WITH  SUPPER 90 tablet 1   No current facility-administered medications for this visit.     Past Medical History  Diagnosis Date  . Hypertension     . Coronary atherosclerosis of unspecified type of vessel, native or graft     Non obstructive  . Cerebrovascular disease, unspecified   . Pure hypercholesterolemia   . Diverticulosis of colon (without mention of hemorrhage)   . Benign neoplasm of colon   . Unspecified hemorrhoids without mention of complication   . Degenerative disc disease   . 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 Hemangioma   . Amyloidosis   . LBBB (left bundle branch block)   . Wide-complex tachycardia ([place]) 01/2011    Felt likely be SVT with abbarancy  . OSA (obstructive sleep apnea)   . Atrial fibrillation ([place])   . Cancer (HCC)     amyloidosis  . Edema 08/29/2013  . Hx of cardiovascular stress test     Adenosine Myoview (02/2014): Apical thinning, no ischemia, EF 48%; low risk.  . Scrotal pain 10/23/2014  . Adenomatous colon polyp 2003    max size 56mm  . Traumatic open wound of left lower leg with delayed healing     Seen [facility] on 05/29/2015  . 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 Chronic venous insufficiency 2016  . DDD (degenerative disc disease), lumbar     Dr. Katherine Roan  . Cough, persistent 09/29/2015    Past Surgical History  Procedure Laterality Date  .  Knee arthroscopy    . Carpal tunnel release  08/2006    Dr Doy Mince at [facility] - Bilateral  . 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 Back surgery  2012    Lumbar, [name]   . Cardioversion N/A 07/10/2013    Procedure: CARDIOVERSION;  Surgeon: Lelon Perla, MD;  Location: [facility] ENDOSCOPY;  Service: Cardiovascular;  Laterality: N/A;  . Colonoscopy w/ biopsies      Social History   Social History  . Marital Status: Married    Spouse Name: N/A  . Number of Children: 2  . Years of Education: N/A   Occupational History  . PRESIDENT     [employer]   Social History Main Topics  . Smoking status: Never Smoker   . Smokeless  tobacco: Never Used  . Alcohol Use: 0.0 oz/week    0 Standard drinks or equivalent per week     Comment: Occassionally  . Drug Use: No  . Sexual Activity: No   Other Topics Concern  . Not on file   Social History Narrative   Lives with wife.      ROS: Back pain but no fevers or chills, productive cough, hemoptysis, dysphasia, odynophagia, melena, hematochezia, dysuria, hematuria, rash, seizure activity, orthopnea, PND, claudication. Remaining systems are negative.  Physical Exam: Well-developed well-nourished in no acute distress.  Skin is warm and dry.  HEENT is normal.  Neck is supple.  Chest is clear to auscultation with normal expansion.  Cardiovascular exam is regular rate and rhythm.  Abdominal exam nontender or distended. No masses palpated. Extremities show trace edema. neuro grossly intact

## 2015-10-24 ENCOUNTER — Other Ambulatory Visit: Payer: Self-pay

## 2015-10-24 MED ORDER — FUROSEMIDE 20 MG PO TABS: ORAL_TABLET | ORAL | Status: DC

## 2015-10-27 ENCOUNTER — Telehealth: Payer: Self-pay | Admitting: Internal Medicine

## 2015-10-27 NOTE — Telephone Encounter (Signed)
Patient Name: Johnny Mitchell  DOB: [birth year]    Initial Comment Caller states he's having shortness of breath, and back pain.   Nurse Assessment  Nurse: Raphael Gibney, RN, Vanita Ingles Date/Time Eilene Ghazi Time): 10/27/2015 11:49:01 AM  Confirm and document reason for call. If symptomatic, describe symptoms. ---Caller states he is SOB. has lower back pain in L4 and L5. The more he stands, the more his back hurts. He is SOB on exertion. No fever. States he has breathing problems for a long time but it has gotten worse recently.  Has the patient traveled out of the country within the last 30 days? ---No  Does the patient have any new or worsening symptoms? ---Yes  Will a triage be completed? ---Yes  Related visit to physician within the last 2 weeks? ---No  Does the PT have any chronic conditions? (i.e. diabetes, asthma, etc.) ---Yes  List chronic conditions. ---heart problems     Guidelines    Guideline Title Affirmed Question Affirmed Notes  Breathing Difficulty [1] MODERATE longstanding difficulty breathing (e.g., speaks in phrases, SOB even at rest, pulse 100-120) AND [2] SAME as normal    Final Disposition User   See PCP When Office is Open (within 3 days) Raphael Gibney, RN, Vanita Ingles    Comments  Caller states he already has appt tomorrow 10/28/15 at 9:30 am with Dr. Larose Kells.   Referrals  REFERRED TO PCP OFFICE   Disagree/Comply: Comply

## 2015-10-28 ENCOUNTER — Ambulatory Visit (INDEPENDENT_AMBULATORY_CARE_PROVIDER_SITE_OTHER): Payer: Medicare Other | Admitting: Internal Medicine

## 2015-10-28 ENCOUNTER — Encounter: Payer: Self-pay | Admitting: Internal Medicine

## 2015-10-28 VITALS — BP 126/68 | HR 64 | Temp 97.6°F | Ht 71.0 in | Wt 265.4 lb

## 2015-10-28 DIAGNOSIS — M545 Low back pain, unspecified: Secondary | ICD-10-CM

## 2015-10-28 DIAGNOSIS — I2510 Atherosclerotic heart disease of native coronary artery without angina pectoris: Secondary | ICD-10-CM | POA: Diagnosis not present

## 2015-10-28 DIAGNOSIS — R0689 Other abnormalities of breathing: Secondary | ICD-10-CM | POA: Diagnosis not present

## 2015-10-28 DIAGNOSIS — G8929 Other chronic pain: Secondary | ICD-10-CM | POA: Diagnosis not present

## 2015-10-28 DIAGNOSIS — Z09 Encounter for follow-up examination after completed treatment for conditions other than malignant neoplasm: Secondary | ICD-10-CM

## 2015-10-28 NOTE — Assessment & Plan Note (Signed)
Chronic back pain: See previous entry is, refer to pain management. DOE: Chronic for few years, states he will see his heart doctor soon but request a pulmonary eval. Will refer.

## 2015-10-28 NOTE — Progress Notes (Signed)
Subjective:    Patient ID: Johnny Mitchell, male    DOB: 1941-06-10, 74 y.o.   MRN: [id number]  DOS:  10/28/2015 Type of visit - description : Acute visit Interval history: Continue with back pain, request a referral to see the pain management doctor. Also reports 4-5 years history of dyspnea on exertion. Likes something done about it, pulmonary referral?   Review of Systems Denies chest pain,  lower extremity edema at baseline. No palpitations No nausea, vomiting, blood in the stools. No cough or wheezing Was never a smoker.  Past Medical History  Diagnosis Date  . Hypertension   . Coronary atherosclerosis of unspecified type of vessel, native or graft     Non obstructive  . Cerebrovascular disease, unspecified   . Pure hypercholesterolemia   . Diverticulosis of colon (without mention of hemorrhage)   . Benign neoplasm of colon   . Unspecified hemorrhoids without mention of complication   . Degenerative disc disease   . 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 Hemangioma   . Amyloidosis   . LBBB (left bundle branch block)   . Wide-complex tachycardia ([place]) 01/2011    Felt likely be SVT with abbarancy  . OSA (obstructive sleep apnea)   . Atrial fibrillation ([place])   . Cancer (HCC)     amyloidosis  . Edema 08/29/2013  . Hx of cardiovascular stress test     Adenosine Myoview (02/2014): Apical thinning, no ischemia, EF 48%; low risk.  . Scrotal pain 10/23/2014  . Adenomatous colon polyp 2003    max size 80mm  . Traumatic open wound of left lower leg with delayed healing     Seen at [facility] on 05/29/2015  . 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 Chronic venous insufficiency 2016  . DDD (degenerative disc disease), lumbar     Dr. Katherine Roan  . Cough, persistent 09/29/2015    Past Surgical History  Procedure Laterality Date  . Knee arthroscopy    . Carpal tunnel release   08/2006    Dr Doy Mince at [facility] - Bilateral  . 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 Back surgery  2012    [place]umbar, [name]  . Cardioversion N/A 07/10/2013    Procedure: CARDIOVERSION;  Surgeon: Lelon Perla, MD;  Location: [facility] ENDOSCOPY;  Service: Cardiovascular;  Laterality: N/A;  . Colonoscopy w/ biopsies      Social History   Social History  . Marital Status: Married    Spouse Name: N/A  . Number of Children: 2  . Years of Education: N/A   Occupational History  . PRESIDENT     [employer]   Social History Main Topics  . Smoking status: Never Smoker   . Smokeless tobacco: Never Used  . Alcohol Use: 0.0 oz/week    0 Standard drinks or equivalent per week     Comment: Occassionally  . Drug Use: No  . Sexual Activity: No   Other Topics Concern  . Not on file   Social History Narrative   Lives with wife.          Medication List       This list is accurate as of: 10/28/15  5:48 PM.  Always use your most recent med list.               acyclovir 400 MG tablet  Commonly  known as:  ZOVIRAX  Take 1 tablet (400 mg total) by mouth daily.     amiodarone 200 MG tablet  Commonly known as:  PACERONE  Take 0.5 tablets (100 mg total) by mouth daily.     furosemide 20 MG tablet  Commonly known as:  LASIX  Take 3 tablets ( 60 mg ) twice a day for 4 days only then 3 tablets ( 60 mg ) daily     HYDROcodone-homatropine 5-1.5 MG/5ML syrup  Commonly known as:  HYCODAN  Take 5 mLs by mouth every 6 (six) hours as needed for cough.     pravastatin 40 MG tablet  Commonly known as:  PRAVACHOL  Take 1 tablet (40 mg total) by mouth every evening.     Turmeric 500 MG Caps  Take 1,000 mg by mouth daily.     XARELTO 20 MG Tabs tablet  Generic drug:  rivaroxaban  TAKE ONE TABLET BY MOUTH ONCE DAILY WITH  SUPPER           Objective:   Physical Exam BP 126/68 mmHg  Pulse 64  Temp(Src) 97.6 F (36.4 C) (Oral)  Ht 5\' 11"  (1.803 m)  Wt 265 lb 6 oz (120.373  kg)  BMI 37.03 kg/m2  SpO2 97% General:   Well developed, well nourished . NAD.  HEENT:  Normocephalic . Face symmetric, atraumatic. Neck: No JVD at 45 Lungs:  CTA B Normal respiratory effort, no intercostal retractions, no accessory muscle use. Heart: RRR,  no murmur.  Trace pretibial edema bilaterally  Skin: Not pale. Not jaundice Neurologic:  alert & oriented X3.  Speech normal, gait appropriate for age and unassisted Psych--  Cognition and judgment appear intact.  Cooperative with normal attention span and concentration.  Behavior appropriate. No anxious or depressed appearing.      Assessment & Plan:   Assessment > HTN CV: --CAD  --h/ oCVA  --Paroxysmal A.  fibrillation --Wide-complex tachycardia 2012, felt to be SVT with aberrancy  --Venous insufficiency, edema Chronic DOE since ~ 2011 DJD-- Back pain, chronic. saw Dr.Elsner 2015, saw Dr. Katherine Roan at [facility], after the visit and see the  MRI from 2014 ---> they did not recommend surgery. Amyloidosis Sleep apnea H/o persistent cough  PLAN: Chronic back pain: See previous entry is, refer to pain management. DOE: Chronic for few years, states he will see his heart doctor soon but request a pulmonary eval. Will refer.

## 2015-10-28 NOTE — Progress Notes (Signed)
Pre visit review using our clinic review tool, if applicable. No additional management support is needed unless otherwise documented below in the visit note. 

## 2015-10-28 NOTE — Patient Instructions (Addendum)
    Next visit  for a routine check up in 3 months, fasting       (30 minutes) Please schedule an appointment at the front desk

## 2015-10-29 ENCOUNTER — Ambulatory Visit (INDEPENDENT_AMBULATORY_CARE_PROVIDER_SITE_OTHER): Payer: Medicare Other | Admitting: Cardiology

## 2015-10-29 ENCOUNTER — Encounter: Payer: Self-pay | Admitting: Cardiology

## 2015-10-29 VITALS — BP 148/79 | HR 65 | Ht 71.0 in | Wt 266.8 lb

## 2015-10-29 DIAGNOSIS — I480 Paroxysmal atrial fibrillation: Secondary | ICD-10-CM | POA: Diagnosis not present

## 2015-10-29 DIAGNOSIS — I5032 Chronic diastolic (congestive) heart failure: Secondary | ICD-10-CM

## 2015-10-29 DIAGNOSIS — R0609 Other forms of dyspnea: Secondary | ICD-10-CM

## 2015-10-29 DIAGNOSIS — I2510 Atherosclerotic heart disease of native coronary artery without angina pectoris: Secondary | ICD-10-CM | POA: Diagnosis not present

## 2015-10-29 DIAGNOSIS — N183 Chronic kidney disease, stage 3 unspecified: Secondary | ICD-10-CM

## 2015-10-29 MED ORDER — FUROSEMIDE 40 MG PO TABS: 80.0000 mg | ORAL_TABLET | Freq: Two times a day (BID) | ORAL | Status: DC

## 2015-10-29 NOTE — Assessment & Plan Note (Signed)
Patient mildly volume overloaded. Increase Lasix to 80 mg daily. In 1 week check potassium and renal function. I think it is unlikely that amiodarone is contributing to his dyspnea. I will check a sedimentation rate when we check laboratories. Some of dyspnea may be secondary to deconditioning. He is scheduled to see pulmonary.

## 2015-10-29 NOTE — Assessment & Plan Note (Signed)
Blood pressure is mildly elevated but he states typically controlled. Continue present medications and follow.

## 2015-10-29 NOTE — Patient Instructions (Signed)
Medication Instructions:   INCREASE FUROSEMIDE TO 40 MG TWICE DAILY  Labwork:  Your physician recommends that you return for lab work in: [place]:  Your physician recommends that you schedule a follow-up appointment in: [place]   If you need a refill on your cardiac medications before your next appointment, please call your pharmacy.

## 2015-10-29 NOTE — Assessment & Plan Note (Signed)
Continue statin. 

## 2015-10-29 NOTE — Assessment & Plan Note (Signed)
Continue amiodarone and xarelto.  

## 2015-10-29 NOTE — Assessment & Plan Note (Signed)
Continue amiodarone. 

## 2015-10-29 NOTE — Assessment & Plan Note (Signed)
Continue statin.No aspirin given need for anticoagulation. 

## 2015-11-05 ENCOUNTER — Telehealth: Payer: Self-pay | Admitting: Internal Medicine

## 2015-11-05 ENCOUNTER — Ambulatory Visit (INDEPENDENT_AMBULATORY_CARE_PROVIDER_SITE_OTHER): Payer: Medicare Other | Admitting: Physician Assistant

## 2015-11-05 ENCOUNTER — Encounter: Payer: Self-pay | Admitting: Physician Assistant

## 2015-11-05 ENCOUNTER — Telehealth: Payer: Self-pay | Admitting: *Deleted

## 2015-11-05 ENCOUNTER — Encounter: Payer: Self-pay | Admitting: Internal Medicine

## 2015-11-05 VITALS — BP 132/68 | HR 64 | Temp 98.0°F | Resp 16 | Ht 71.0 in | Wt 264.2 lb

## 2015-11-05 DIAGNOSIS — J00 Acute nasopharyngitis [common cold]: Secondary | ICD-10-CM

## 2015-11-05 DIAGNOSIS — B9689 Other specified bacterial agents as the cause of diseases classified elsewhere: Secondary | ICD-10-CM | POA: Insufficient documentation

## 2015-11-05 DIAGNOSIS — J208 Acute bronchitis due to other specified organisms: Principal | ICD-10-CM

## 2015-11-05 DIAGNOSIS — I2510 Atherosclerotic heart disease of native coronary artery without angina pectoris: Secondary | ICD-10-CM | POA: Diagnosis not present

## 2015-11-05 MED ORDER — AZITHROMYCIN 250 MG PO TABS: ORAL_TABLET | ORAL | Status: DC

## 2015-11-05 MED ORDER — HYDROCODONE-HOMATROPINE 5-1.5 MG/5ML PO SYRP: 5.0000 mL | ORAL_SOLUTION | Freq: Four times a day (QID) | ORAL | Status: DC | PRN

## 2015-11-05 NOTE — Progress Notes (Signed)
Pre visit review using our clinic review tool, if applicable. No additional management support is needed unless otherwise documented below in the visit note/SLS  

## 2015-11-05 NOTE — Progress Notes (Signed)
Patient presents to clinic today c/o 1 week of runny nose, chest congestion and productive cough. Cough productive of brown sputum. Denies fever, chills, chest pain or SOB. Denies headache or facial pain. Denies recent travel or sick contact.  Has not taken anything for your symptoms.  Past Medical History  Diagnosis Date  . Hypertension   . Coronary atherosclerosis of unspecified type of vessel, native or graft     Non obstructive  . Cerebrovascular disease, unspecified   . Pure hypercholesterolemia   . Diverticulosis of colon (without mention of hemorrhage)   . Benign neoplasm of colon   . Unspecified hemorrhoids without mention of complication   . Degenerative disc disease   . 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 Hemangioma   . Amyloidosis   . LBBB (left bundle branch block)   . Wide-complex tachycardia ([place]) 01/2011    Felt likely be SVT with abbarancy  . OSA (obstructive sleep apnea)   . Atrial fibrillation ([place])   . Cancer (HCC)     amyloidosis  . Edema 08/29/2013  . Hx of cardiovascular stress test     Adenosine Myoview (02/2014): Apical thinning, no ischemia, EF 48%; low risk.  . Scrotal pain 10/23/2014  . Adenomatous colon polyp 2003    max size 55m  . Traumatic open wound of left lower leg with delayed healing     [facility]  . [place]-pressure [place]     [facility]  . Chronic venous insufficiency 2016  . DDD (degenerative disc disease), lumbar     Dr. KKatherine Roan . Cough, persistent 09/29/2015    Current Outpatient Prescriptions on File Prior to Visit  Medication Sig Dispense Refill  . acyclovir (ZOVIRAX) 400 MG tablet Take 1 tablet (400 mg total) by mouth daily. 90 tablet 3  . amiodarone (PACERONE) 200 MG tablet Take 0.5 tablets (100 mg total) by mouth daily. 90 tablet 3  . furosemide (LASIX) 40 MG tablet Take 2 tablets (80 mg total) by mouth 2  (two) times daily. Take 3 tablets ( 60 mg ) twice a day for 4 days only then 3 tablets ( 60 mg ) daily    . pravastatin (PRAVACHOL) 40 MG tablet Take 1 tablet (40 mg total) by mouth every evening. 90 tablet 3  . Turmeric 500 MG CAPS Take 1,000 mg by mouth daily.    .Alveda Reasons20 MG TABS tablet TAKE ONE TABLET BY MOUTH ONCE DAILY WITH  SUPPER 90 tablet 1   No current facility-administered medications on file prior to visit.    No Known Allergies  Family History  Problem Relation Age of Onset  . Lung cancer Mother   . Skin cancer Father     Melanoma  . Colon cancer Neg Hx   . Colon polyps Neg Hx   . Kidney disease Neg Hx   . Esophageal cancer Neg Hx   . Diabetes Neg Hx   . Gallbladder disease Neg Hx     Social History   Social History  . Marital Status: Married    Spouse Name: N/A  . Number of Children: 2  . Years of Education: N/A   Occupational History  . PRESIDENT     [employer]   Social History Main Topics  . Smoking status: Never Smoker   . Smokeless tobacco: Never Used  . Alcohol Use: 0.0 oz/week  0 Standard drinks or equivalent per week     Comment: Occassionally  . Drug Use: No  . Sexual Activity: No   Other Topics Concern  . None   Social History Narrative   Lives with wife.     Review of Systems - See HPI.  All other ROS are negative.  BP 132/68 mmHg  Pulse 64  Temp(Src) 98 F (36.7 C) (Oral)  Resp 16  Ht '5\' 11"'  (1.803 m)  Wt 264 lb 4 oz (119.863 kg)  BMI 36.87 kg/m2  SpO2 97%  Physical Exam  Constitutional: He is oriented to person, place, and time and well-developed, well-nourished, and in no distress.  HENT:  Head: Normocephalic and atraumatic.  Right Ear: External ear normal.  Left Ear: External ear normal.  Nose: Nose normal.  Mouth/Throat: Oropharynx is clear and moist. No oropharyngeal exudate.  TM within normal limits.  Eyes: Conjunctivae are normal.  Cardiovascular: Normal rate, regular rhythm, normal heart sounds and  intact distal pulses.   Pulmonary/Chest: Effort normal and breath sounds normal. No respiratory distress. He has no wheezes. He has no rales. He exhibits no tenderness.  Neurological: He is alert and oriented to person, place, and time.  Skin: Skin is warm and dry. No rash noted.  Psychiatric: Affect normal.  Vitals reviewed.   Recent Results (from the past 2160 hour(s))  Comprehensive metabolic panel     Status: Abnormal   Collection Time: 09/22/15  8:30 AM  Result Value Ref Range   Sodium 142 136 - 145 mEq/L   Potassium 4.0 3.5 - 5.1 mEq/L   Chloride 106 98 - 109 mEq/L   CO2 30 (H) 22 - 29 mEq/L   Glucose 92 70 - 140 mg/dl    Comment: Glucose reference range is for nonfasting patients. Fasting glucose reference range is 70- 100.   BUN 20.5 7.0 - 26.0 mg/dL   Creatinine 1.2 0.7 - 1.3 mg/dL   Total Bilirubin 0.54 0.20 - 1.20 mg/dL   Alkaline Phosphatase 106 40 - 150 U/L   AST 16 5 - 34 U/L   ALT 12 0 - 55 U/L   Total Protein 6.6 6.4 - 8.3 g/dL   Albumin 3.4 (L) 3.5 - 5.0 g/dL   Calcium 9.2 8.4 - 10.4 mg/dL   Anion Gap 7 3 - 11 mEq/L   EGFR 56 (L) >90 ml/min/1.73 m2    Comment: eGFR is calculated using the CKD-EPI Creatinine Equation (2009)  CBC with Differential/Platelet     Status: Abnormal   Collection Time: 09/22/15  8:30 AM  Result Value Ref Range   WBC 6.6 4.0 - 10.3 10e3/uL   NEUT# 4.6 1.5 - 6.5 10e3/uL   HGB 13.5 13.0 - 17.1 g/dL   HCT 41.5 38.4 - 49.9 %   Platelets 236 140 - 400 10e3/uL   MCV 97.5 79.3 - 98.0 fL   MCH 31.7 27.2 - 33.4 pg   MCHC 32.6 32.0 - 36.0 g/dL   RBC 4.26 4.20 - 5.82 10e6/uL   RDW 15.4 (H) 11.0 - 14.6 %   lymph# 0.9 0.9 - 3.3 10e3/uL   MONO# 0.8 0.1 - 0.9 10e3/uL   Eosinophils Absolute 0.1 0.0 - 0.5 10e3/uL   Basophils Absolute 0.2 (H) 0.0 - 0.1 10e3/uL   NEUT% 69.4 39.0 - 75.0 %   LYMPH% 14.3 14.0 - 49.0 %   MONO% 12.0 0.0 - 14.0 %   EOS% 1.5 0.0 - 7.0 %   BASO% 2.8 (H) 0.0 -  2.0 %  SPEP & IFE with QIG     Status: Abnormal    Collection Time: 09/22/15  8:30 AM  Result Value Ref Range   IgG (Immunoglobin G), Serum 1090 650 - 1600 mg/dL   IgA 213 68 - 379 mg/dL   IgM, Serum 40 (L) 41 - 251 mg/dL   Immunofix Electr Int *     Comment: No monoclonal protein identified.Reviewed by Odis Hollingshead, MD, PhD, FCAP (Electronic Signature onFile)   Total Protein, Serum Electrophoresis 6.4 6.1 - 8.1 g/dL   Albumin ELP 3.5 (L) 3.8 - 4.8 g/dL   Alpha-1-Globulin 0.3 0.2 - 0.3 g/dL   Alpha-2-Globulin 0.9 0.5 - 0.9 g/dL   Beta Globulin 0.4 0.4 - 0.6 g/dL   Beta 2 0.4 0.2 - 0.5 g/dL   Gamma Globulin 1.0 0.8 - 1.7 g/dL   Abnormal Protein Band1 NOT DET g/dL   SPE Interp. *     Comment: Normal pattern with slight decrease in albumin.Results are consistent with SPE performed on 06/20/15. Reviewed by Odis Hollingshead, MD, PhD, FCAP (Electronic Signature onFile)   COMMENT (PROTEIN ELECTROPHOR) *     Comment: ---------------Serum protein electrophoresis is a useful screening procedure in thedetection of various pathophysiologic states such as inflammation,gammopathies, protein loss and other dysproteinemias.  Immunofixationelectrophoresis (IFE) is a more  sensitive technique for theidentification of M-proteins found in patients with monoclonalgammopathy of unknown significance (MGUS), amyloidosis, early ortreated myeloma or macroglobulinemia, solitary plasmacytoma orextramedullary plasmacytoma.    Abnormal Protein Band2 NOT DET g/dL   Abnormal Protein Band3 NOT DET g/dL  Kappa/lambda light chains     Status: Abnormal   Collection Time: 09/22/15  8:30 AM  Result Value Ref Range   Kappa free light chain 5.93 (H) 0.33 - 1.94 mg/dL   Lambda Free Lght Chn 1.87 0.57 - 2.63 mg/dL   Kappa:Lambda Ratio 3.17 (H) 0.26 - 1.65  TSH     Status: None   Collection Time: 10/21/15 10:15 AM  Result Value Ref Range   TSH 1.738 0.350 - 4.500 uIU/mL  B Nat Peptide     Status: Abnormal   Collection Time: 10/21/15 10:15 AM  Result Value Ref Range    Brain Natriuretic Peptide 126.4 (H) 0.0 - 100.0 pg/mL    Assessment/Plan: Acute bacterial bronchitis Rx Azithromycin.  Increase fluids.  Rest.  Saline nasal spray.  Probiotic.  Mucinex as directed.  Humidifier in bedroom.  Call or return to clinic if symptoms are not improving.

## 2015-11-05 NOTE — Telephone Encounter (Signed)
Patient called requesting refill for Hycodan syrup.  Return number when ready for pick up is [phone].  Needs to pick up today.  Voicemail left for collaborative nurse.

## 2015-11-05 NOTE — Assessment & Plan Note (Signed)
Rx Azithromycin.  Increase fluids.  Rest.  Saline nasal spray.  Probiotic.  Mucinex as directed.  Humidifier in bedroom.  Call or return to clinic if symptoms are not improving.  

## 2015-11-05 NOTE — Telephone Encounter (Signed)
Called pt to instruct him to contact his PCP for refill on Hycodan.  Dr. Alvy Bimler stated she would refill one time for cough and pt is to f/u w/ PCP for ongoing cough. He verbalized understanding.

## 2015-11-05 NOTE — Patient Instructions (Signed)
Take antibiotic (Azithromycin) as directed.  Increase fluids.  Get plenty of rest. Use Mucinex for congestion. Use Cough medication as directed. Take a daily probiotic (I recommend Align or Culturelle, but even Activia Yogurt may be beneficial).  A humidifier placed in the bedroom may offer some relief for a dry, scratchy throat of nasal irritation.  Read information below on acute bronchitis. Please call or return to clinic if symptoms are not improving.  Acute Bronchitis Bronchitis is when the airways that extend from the windpipe into the lungs get red, puffy, and painful (inflamed). Bronchitis often causes thick spit (mucus) to develop. This leads to a cough. A cough is the most common symptom of bronchitis. In acute bronchitis, the condition usually begins suddenly and goes away over time (usually in 2 weeks). Smoking, allergies, and asthma can make bronchitis worse. Repeated episodes of bronchitis may cause more lung problems.  HOME CARE  Rest.  Drink enough fluids to keep your pee (urine) clear or pale yellow (unless you need to limit fluids as told by your doctor).  Only take over-the-counter or prescription medicines as told by your doctor.  Avoid smoking and secondhand smoke. These can make bronchitis worse. If you are a smoker, think about using nicotine gum or skin patches. Quitting smoking will help your lungs heal faster.  Reduce the chance of getting bronchitis again by:  Washing your hands often.  Avoiding people with cold symptoms.  Trying not to touch your hands to your mouth, nose, or eyes.  Follow up with your doctor as told.  GET HELP IF: Your symptoms do not improve after 1 week of treatment. Symptoms include:  Cough.  Fever.  Coughing up thick spit.  Body aches.  Chest congestion.  Chills.  Shortness of breath.  Sore throat.  GET HELP RIGHT AWAY IF:   You have an increased fever.  You have chills.  You have severe shortness of breath.  You  have bloody thick spit (sputum).  You throw up (vomit) often.  You lose too much body fluid (dehydration).  You have a severe headache.  You faint.  MAKE SURE YOU:   Understand these instructions.  Will watch your condition.  Will get help right away if you are not doing well or get worse. Document Released: 05/17/2008 Document Revised: 08/01/2013 Document Reviewed: 05/22/2013 [facility] Patient Information 2015 [place], [place]. This information is not intended to replace advice given to you by your health care provider. Make sure you discuss any questions you have with your health care provider.

## 2015-11-13 NOTE — Telephone Encounter (Signed)
Error/gd °

## 2015-11-27 ENCOUNTER — Other Ambulatory Visit: Payer: Self-pay | Admitting: Hematology and Oncology

## 2015-12-01 ENCOUNTER — Telehealth: Payer: Self-pay | Admitting: Internal Medicine

## 2015-12-01 DIAGNOSIS — M545 Low back pain: Secondary | ICD-10-CM

## 2015-12-01 DIAGNOSIS — R52 Pain, unspecified: Secondary | ICD-10-CM

## 2015-12-01 NOTE — Telephone Encounter (Signed)
I am really not sure if that is going to help however if he likes to get the opinion from Dr. Maxie Better, okay to set that up. Strongly recommend to keep the appointment to see pain management anyway

## 2015-12-01 NOTE — Telephone Encounter (Signed)
Pt requesting referral for [facility] Orth[facility] Dr. Maxie Better for back pain. He has appt at pain mgmt 12/30/14 but he wants to do something in the meantime.   He is asking for oxycodone 4 hr. Last oxycodone I see in chart was from a different provider in 2012. Pt said the hydrocodone does nothing for him.  Pt Ph# [phone]

## 2015-12-01 NOTE — Telephone Encounter (Signed)
Referral placed.

## 2015-12-01 NOTE — Telephone Encounter (Signed)
Please advise 

## 2015-12-02 ENCOUNTER — Institutional Professional Consult (permissible substitution): Payer: Medicare Other | Admitting: Pulmonary Disease

## 2015-12-11 ENCOUNTER — Telehealth: Payer: Self-pay

## 2015-12-11 MED ORDER — OXYCODONE-ACETAMINOPHEN 5-300 MG PO TABS: 1.0000 | ORAL_TABLET | Freq: Three times a day (TID) | ORAL | Status: DC | PRN

## 2015-12-11 NOTE — Telephone Encounter (Signed)
LMOM informing Pt that Rx for Oxycodone is ready for pick up at front desk. Instructed Pt to avoid Tylenol w/ oxycodone and to watch out for excessive somnolence.

## 2015-12-11 NOTE — Telephone Encounter (Signed)
Please advise, Oxycodone not on Pt's med list.

## 2015-12-11 NOTE — Telephone Encounter (Signed)
See phone note from today.

## 2015-12-11 NOTE — Telephone Encounter (Signed)
Patient want a refill for Oxycodone 4hr  Said the other is not helping and he cannot get in with pain management unti jan 18 and needs some medication to hold him over. Please advise

## 2015-12-11 NOTE — Telephone Encounter (Signed)
I prescribed hydrocodone back in September, however if oxycodone is working better I'm willing to rx it .  Advise patient:  Discontinue hydrocodone, start Oxycodone-acetaminophen.  Also it contains acetaminophen consequently don't combine it with OTC Tylenol. Will cause drowsiness

## 2015-12-11 NOTE — Telephone Encounter (Signed)
Pt is requesting medication prescription for Oxycodone 4 hr due to back pain, Pt unable to establish w/ pain management until late January 2017. Oxycodone not on med list. Please advise.

## 2015-12-14 HISTORY — PX: OTHER SURGICAL HISTORY: SHX169

## 2015-12-24 ENCOUNTER — Ambulatory Visit (INDEPENDENT_AMBULATORY_CARE_PROVIDER_SITE_OTHER): Payer: Medicare Other | Admitting: Cardiology

## 2015-12-24 ENCOUNTER — Telehealth: Payer: Self-pay | Admitting: Cardiology

## 2015-12-24 ENCOUNTER — Encounter: Payer: Self-pay | Admitting: Cardiology

## 2015-12-24 VITALS — BP 138/79 | HR 76 | Ht 71.0 in | Wt 271.1 lb

## 2015-12-24 DIAGNOSIS — I5033 Acute on chronic diastolic (congestive) heart failure: Secondary | ICD-10-CM | POA: Diagnosis not present

## 2015-12-24 DIAGNOSIS — N183 Chronic kidney disease, stage 3 unspecified: Secondary | ICD-10-CM

## 2015-12-24 DIAGNOSIS — I10 Essential (primary) hypertension: Secondary | ICD-10-CM

## 2015-12-24 DIAGNOSIS — E7800 Pure hypercholesterolemia, unspecified: Secondary | ICD-10-CM

## 2015-12-24 DIAGNOSIS — I480 Paroxysmal atrial fibrillation: Secondary | ICD-10-CM

## 2015-12-24 DIAGNOSIS — I2510 Atherosclerotic heart disease of native coronary artery without angina pectoris: Secondary | ICD-10-CM | POA: Diagnosis not present

## 2015-12-24 DIAGNOSIS — I471 Supraventricular tachycardia: Secondary | ICD-10-CM

## 2015-12-24 DIAGNOSIS — R0609 Other forms of dyspnea: Secondary | ICD-10-CM

## 2015-12-24 MED ORDER — FUROSEMIDE 40 MG PO TABS: 40.0000 mg | ORAL_TABLET | Freq: Two times a day (BID) | ORAL | Status: DC

## 2015-12-24 NOTE — Patient Instructions (Signed)
Medication Instructions:   CALL WITH THE DOSE OF FUROSEMIDE CURRENTLY TAKING  Labwork:  Your physician recommends that you return for lab work in: ONE WEEK  Follow-Up:  Your physician wants you to follow-up in: [place] will receive a reminder letter in the mail two months in advance. If you don't receive a letter, please call our office to schedule the follow-up appointment.

## 2015-12-24 NOTE — Telephone Encounter (Signed)
Spoke with pt wife, per dr [name], pt is to take 60 mg of furosemide twice daily.

## 2015-12-24 NOTE — Assessment & Plan Note (Signed)
Blood pressure controlled. Continue present medications. 

## 2015-12-24 NOTE — Assessment & Plan Note (Signed)
Management per oncology. 

## 2015-12-24 NOTE — Assessment & Plan Note (Signed)
Continue statin. 

## 2015-12-24 NOTE — Assessment & Plan Note (Signed)
Continue amiodarone and xarelto. Check hemoglobin, renal function, liver functions, TSH and sedimentation rate.

## 2015-12-24 NOTE — Telephone Encounter (Signed)
Pt's wife was instructed to call in with the pt's current meds , which are Furosemide 20mg (TID) . Please f/u with her   Thanks

## 2015-12-24 NOTE — Assessment & Plan Note (Signed)
Continue amiodarone. 

## 2015-12-24 NOTE — Assessment & Plan Note (Signed)
Continue statin. Not on aspirin given need for anticoagulation. 

## 2015-12-24 NOTE — Progress Notes (Signed)
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 LHC 10/2009: pLAD 20%, dLAD 30%, mOM2 70-80% (unchanged compared to previous), pRCA 20%, dRCA 20%.  Admitted in February of 2012 with a wide complex tachycardia. Seen by EP and rhythm was most likely felt to be SVT with aberrancy. It was felt that if symptoms recur ablation would be warranted.  Cardiac MRI (09/05/13): Moderate LVE, focal septal hypertrophy, EF 51%, global HK, mild RVE, normal RVSF, no evidence of cardiac amyloid. Nuclear study in March of 2015 showed apical thinning but no ischemia. Ejection fraction was 48%. Echo 11/15 showed EF 123456, grade 2 diastolic dysfunction, mild LAE and mildly dilated aortic root. Patient seen November 8 by Dr. Martinique for increased dyspnea. Amiodarone decreased to 100 mg daily. Chest x-ray showed low-grade CHF with mild interstitial edema. BNP 126. Since last seen, Patient denies dyspnea, chest pain, palpitations or syncope. No orthopnea or PND. Increased pedal edema.  Current Outpatient Prescriptions  Medication Sig Dispense Refill  . acyclovir (ZOVIRAX) 400 MG tablet Take 1 tablet (400 mg total) by mouth daily. 90 tablet 3  . amiodarone (PACERONE) 200 MG tablet Take 0.5 tablets (100 mg total) by mouth daily. 90 tablet 3  . furosemide (LASIX) 40 MG tablet Take 2 tablets (80 mg total) by mouth 2 (two) times daily. Take 3 tablets ( 60 mg ) twice a day for 4 days only then 3 tablets ( 60 mg ) daily    . oxycodone-acetaminophen (LYNOX) 5-300 MG tablet Take 1 tablet by mouth every 8 (eight) hours as needed for pain. 30 tablet 0  . pravastatin (PRAVACHOL) 40 MG tablet Take 1 tablet (40 mg total) by mouth every evening. 90 tablet 3  . Turmeric 500 MG CAPS Take 1,000 mg by mouth daily.    Alveda Reasons 20 MG TABS tablet TAKE ONE TABLET BY MOUTH ONCE DAILY WITH  SUPPER 90 tablet 1   No current facility-administered medications for this visit.      Past Medical History  Diagnosis Date  . Hypertension   . Coronary atherosclerosis of unspecified type of vessel, native or graft     Non obstructive  . Cerebrovascular disease, unspecified   . Pure hypercholesterolemia   . Diverticulosis of colon (without mention of hemorrhage)   . Benign neoplasm of colon   . Unspecified hemorrhoids without mention of complication   . Degenerative disc disease   . 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 Hemangioma   . Amyloidosis   . LBBB (left bundle branch block)   . Wide-complex tachycardia ([place]) 01/2011    Felt likely be SVT with abbarancy  . OSA (obstructive sleep apnea)   . Atrial fibrillation ([place])   . Cancer (HCC)     amyloidosis  . Edema 08/29/2013  . Hx of cardiovascular stress test     Adenosine Myoview (02/2014): Apical thinning, no ischemia, EF 48%; low risk.  . Scrotal pain 10/23/2014  . Adenomatous colon polyp 2003    max size 35mm  . Traumatic open wound of left lower leg with delayed healing     Se[facility] 05/29/[facility]  . 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 Chronic venous insufficiency 2016  . DDD (degenerative disc disease), lumbar     Dr. Katherine Roan  . Cough, persistent 09/29/2015  Past Surgical History  Procedure Laterality Date  . Knee arthroscopy    . Carpal tunnel release  08/2006    [facility] - [facility]  . 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 Back surgery  2012    Lumbar, [name]  . Cardioversion N/A 07/10/2013    Procedure: CARDIOVERSION;  Surgeon: Lelon Perla, MD;  Location: [facility] ENDOSCOPY;  Service: Cardiovascular;  Laterality: N/A;  . Colonoscopy w/ biopsies      Social History   Social History  . Marital Status: Married    Spouse Name: N/A  . Number of Children: 2  . Years of Education: N/A   Occupational History  . PRESIDENT     [employer]   Social History  Main Topics  . Smoking status: Never Smoker   . Smokeless tobacco: Never Used  . Alcohol Use: 0.0 oz/week    0 Standard drinks or equivalent per week     Comment: Occassionally  . Drug Use: No  . Sexual Activity: No   Other Topics Concern  . Not on file   Social History Narrative   Lives with wife.      Family History  Problem Relation Age of Onset  . Lung cancer Mother   . Skin cancer Father     Melanoma  . Colon cancer Neg Hx   . Colon polyps Neg Hx   . Kidney disease Neg Hx   . Esophageal cancer Neg Hx   . Diabetes Neg Hx   . Gallbladder disease Neg Hx     ROS: Back pain but no fevers or chills, productive cough, hemoptysis, dysphasia, odynophagia, melena, hematochezia, dysuria, hematuria, rash, seizure activity, orthopnea, PND,  claudication. Remaining systems are negative.  Physical Exam: Well-developed well-nourished in no acute distress.  Skin is warm and dry.  HEENT is normal.  Neck is supple.  Chest is clear to auscultation with normal expansion.  Cardiovascular exam is regular rate and rhythm.  Abdominal exam nontender or distended. No masses palpated. Extremities show 1+ edema. neuro grossly intact  ECG Sinus rhythm with first-degree AV block. Left bundle branch block.

## 2015-12-24 NOTE — Assessment & Plan Note (Addendum)
Plan to increase Lasix to 80 mg daily. Check potassium and renal function in one week.

## 2015-12-25 DIAGNOSIS — I10 Essential (primary) hypertension: Secondary | ICD-10-CM | POA: Diagnosis not present

## 2015-12-25 DIAGNOSIS — Z9484 Stem cells transplant status: Secondary | ICD-10-CM | POA: Diagnosis not present

## 2015-12-25 DIAGNOSIS — E858 Other amyloidosis: Secondary | ICD-10-CM | POA: Diagnosis not present

## 2015-12-26 ENCOUNTER — Telehealth: Payer: Self-pay | Admitting: *Deleted

## 2015-12-26 DIAGNOSIS — E8589 Other amyloidosis: Secondary | ICD-10-CM

## 2015-12-26 NOTE — Telephone Encounter (Signed)
-----   Message from Lelon Perla, MD sent at 12/26/2015  5:01 PM EST ----- Regarding: FW: Cardiac MRI Debra, Can you arrange cardiac MRI on Mr Nour Theel  ----- Message -----    From: Heath Lark, MD    Sent: 12/26/2015   4:09 PM      To: Lelon Perla, MD Subject: Cardiac MRI                                    Hi Dr. [name],  I got a call from [facility] and they request cardiac MRI (he has amyloidosis) to evaluate cardiac function prior to clinical trial. Can you help me order this?  Thanks, Ni

## 2015-12-26 NOTE — Telephone Encounter (Signed)
Spoke with pt, aware office will call to schedule

## 2015-12-29 ENCOUNTER — Encounter: Payer: Self-pay | Admitting: Cardiology

## 2015-12-29 ENCOUNTER — Telehealth: Payer: Self-pay | Admitting: Cardiology

## 2015-12-29 NOTE — Telephone Encounter (Signed)
Spoke with pt, he is waiting a call to schedule MRI. Aware will send a message to schedulers.

## 2015-12-29 NOTE — Telephone Encounter (Signed)
Johnny Mitchell is returning call from 12/26/15. Please call   Thanks

## 2015-12-29 NOTE — Telephone Encounter (Signed)
Pt calling again,I told him you were seeing patients.

## 2015-12-31 DIAGNOSIS — G8929 Other chronic pain: Secondary | ICD-10-CM | POA: Diagnosis not present

## 2015-12-31 DIAGNOSIS — M5136 Other intervertebral disc degeneration, lumbar region: Secondary | ICD-10-CM | POA: Diagnosis not present

## 2015-12-31 DIAGNOSIS — Z79899 Other long term (current) drug therapy: Secondary | ICD-10-CM | POA: Diagnosis not present

## 2015-12-31 DIAGNOSIS — M47816 Spondylosis without myelopathy or radiculopathy, lumbar region: Secondary | ICD-10-CM | POA: Diagnosis not present

## 2016-01-01 ENCOUNTER — Ambulatory Visit (INDEPENDENT_AMBULATORY_CARE_PROVIDER_SITE_OTHER): Payer: Medicare Other | Admitting: Pulmonary Disease

## 2016-01-01 ENCOUNTER — Encounter: Payer: Self-pay | Admitting: Pulmonary Disease

## 2016-01-01 VITALS — BP 124/75 | HR 71 | Ht 70.0 in | Wt 274.0 lb

## 2016-01-01 DIAGNOSIS — I5032 Chronic diastolic (congestive) heart failure: Secondary | ICD-10-CM

## 2016-01-01 DIAGNOSIS — I2510 Atherosclerotic heart disease of native coronary artery without angina pectoris: Secondary | ICD-10-CM | POA: Diagnosis not present

## 2016-01-01 DIAGNOSIS — G4733 Obstructive sleep apnea (adult) (pediatric): Secondary | ICD-10-CM

## 2016-01-01 DIAGNOSIS — R0602 Shortness of breath: Secondary | ICD-10-CM | POA: Diagnosis not present

## 2016-01-01 DIAGNOSIS — E859 Amyloidosis, unspecified: Secondary | ICD-10-CM

## 2016-01-01 NOTE — Progress Notes (Signed)
Subjective:    Patient ID: Johnny Mitchell, male    DOB: December 12, 1941, 75 y.o.   MRN: [id number]  HPI  75 year old never smoker presents for evaluation of dyspnea on exertion. He was diagnosed with amyloidosis in 2010 and underwent bone marrow transplant in 2011 at [facility]. 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 He has atrial fibrillation and is rate controlled on amiodarone and on Xarelto. He was placed on Lasix for bipedal edema and dose is being titrated. On 40 mg edema is not much decreased. He desaturates to 89% on walking and improved spontaneously back to 99% on room air.  He denies wheezing or orthopnea or paroxysmal nocturnal dyspnea.  He has moderate OSA and was started on CPAP but he admits to not being compliant with this.  Chest x-ray 10/2015 shows interstitial edema with bilateral small effusions    Significant tests/ events  ~ CT Chest = CTAngio done 11/10 by Cards & showed no evid PE, atelectasis, sm effusions, edema of chest wall, sm HH...  ~ PSG 2010 showed AHI=23 w/ desat to 60%; advised wt reduction & referred to DrSood=> started on CPAP   Past Medical History  Diagnosis Date  . Hypertension   . Coronary atherosclerosis of unspecified type of vessel, native or graft     Non obstructive  . Cerebrovascular disease, unspecified   . Pure hypercholesterolemia   . Diverticulosis of colon (without mention of hemorrhage)   . Benign neoplasm of colon   . Unspecified hemorrhoids without mention of complication   . Degenerative disc disease   . 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 Hemangioma   . Amyloidosis   . LBBB (left bundle branch block)   . Wide-complex tachycardia ([place]) 01/2011    Felt likely be SVT with abbarancy  . OSA (obstructive sleep apnea)   . Atrial fibrillation ([place])   . Cancer (HCC)     amyloidosis  . Edema 08/29/2013  . Hx  of cardiovascular stress test     Adenosine Myoview (02/2014): Apical thinning, no ischemia, EF 48%; low risk.  . Scrotal pain 10/23/2014  . Adenomatous colon polyp 2003    max size 8mm  . Traumatic open wound of left lower leg with delayed healing     Seen at [facility] on 05/29/2015  . 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 Chronic venous insufficiency 2016  . DDD (degenerative disc disease), lumbar     Dr. Katherine Roan  . Cough, persistent 09/29/2015   Past Surgical History  Procedure Laterality Date  . Knee arthroscopy    . Carpal tunnel release  08/2006    Dr Doy Mince at [facility] - Bilateral  . 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 Back surgery  2012    Lumbar, [name] Hillman  . Cardioversion N/A 07/10/2013    Procedure: CARDIOVERSION;  Surgeon: Lelon Perla, MD;  Location: [facility] ENDOSCOPY;  Service: Cardiovascular;  Laterality: N/A;  . Colonoscopy w/ biopsies     No Known Allergies  Social History   Social History  . Marital Status: Married    Spouse Name: N/A  . Number of Children: 2  . Years of Education: N/A   Occupational History  . PRESIDENT     [employer]   Social History Main Topics  . Smoking  status: Never Smoker   . Smokeless tobacco: Never Used  . Alcohol Use: 0.0 oz/week    0 Standard drinks or equivalent per week     Comment: Occassionally  . Drug Use: No  . Sexual Activity: No   Other Topics Concern  . Not on file   Social History Narrative   Lives with wife.      Family History  Problem Relation Age of Onset  . Lung cancer Mother   . Skin cancer Father     Melanoma  . Colon cancer Neg Hx   . Colon polyps Neg Hx   . Kidney disease Neg Hx   . Esophageal cancer Neg Hx   . Diabetes Neg Hx   . Gallbladder disease Neg Hx      Review of Systems  Constitutional: Negative for fatigue and unexpected weight change.  HENT: Negative for congestion, dental problem, ear pain,  mouth sores, nosebleeds, postnasal drip, rhinorrhea, sinus pressure, sneezing, sore throat and trouble swallowing.   Eyes: Negative for redness and itching.  Respiratory: Positive for shortness of breath. Negative for cough, chest tightness and wheezing.   Cardiovascular: Positive for leg swelling. Negative for palpitations.  Gastrointestinal: Negative for nausea and vomiting.  Genitourinary: Negative for dysuria.  Musculoskeletal: Negative for joint swelling.  Skin: Negative for rash.  Neurological: Negative for headaches.  Hematological: Bruises/bleeds easily.  Psychiatric/Behavioral: Negative for dysphoric mood.       Objective:   Physical Exam   Gen. Pleasant, obese, in no distress, normal affect ENT - no lesions, no post nasal drip, class 2-3 airway Neck: No JVD, no thyromegaly, no carotid bruits Lungs: no use of accessory muscles, no dullness to percussion, decreased without rales or rhonchi  Cardiovascular: Rhythm regular, heart sounds  normal, no murmurs or gallops, 2+ peripheral edema Abdomen: soft and non-tender, no hepatosplenomegaly, BS normal. Musculoskeletal: No deformities, no cyanosis or clubbing Neuro:  alert, non focal, no tremors        Assessment & Plan:

## 2016-01-01 NOTE — Patient Instructions (Signed)
Take lasix twice daily Ambulatory satn Check PFTs

## 2016-01-01 NOTE — Assessment & Plan Note (Signed)
I asked him to increase Lasix to 40 mg twice daily He has significant bipedal edema and bilateral effusions

## 2016-01-01 NOTE — Assessment & Plan Note (Signed)
Seems to be the main issue. He is going to have a cardiac MRI to look for cardiac amyloid. If that is positive, we may stop further workup for dyspnea. If negative can proceed with pulmonary function testing, although diastolic heart failure seems to be the obvious reason for his dyspnea. He does not seem to need oxygen at this point , hopefully he will improve with diuresis

## 2016-01-01 NOTE — Assessment & Plan Note (Signed)
emphaSized compliance on CPAP

## 2016-01-14 ENCOUNTER — Ambulatory Visit (HOSPITAL_COMMUNITY)
Admission: RE | Admit: 2016-01-14 | Discharge: 2016-01-14 | Disposition: A | Discharge status: Home / Self Care | Payer: Medicare Other | Source: Ambulatory Visit | Attending: Cardiology | Admitting: Cardiology

## 2016-01-14 DIAGNOSIS — E858 Other amyloidosis: Secondary | ICD-10-CM | POA: Diagnosis not present

## 2016-01-14 DIAGNOSIS — E8589 Other amyloidosis: Secondary | ICD-10-CM

## 2016-01-14 LAB — CREATININE, SERUM
Creatinine, Ser: 1.7 mg/dL — ABNORMAL HIGH (ref 0.61–1.24)
GFR calc non Af Amer: 37 mL/min — ABNORMAL LOW (ref >60)
GFR, EST AFRICAN AMERICAN: 43 mL/min — AB (ref >60)

## 2016-01-15 DIAGNOSIS — G8929 Other chronic pain: Secondary | ICD-10-CM | POA: Diagnosis not present

## 2016-01-15 DIAGNOSIS — M47816 Spondylosis without myelopathy or radiculopathy, lumbar region: Secondary | ICD-10-CM | POA: Diagnosis not present

## 2016-01-29 ENCOUNTER — Ambulatory Visit: Payer: Medicare Other | Admitting: Internal Medicine

## 2016-02-02 ENCOUNTER — Telehealth: Payer: Self-pay | Admitting: Cardiology

## 2016-02-02 NOTE — Telephone Encounter (Signed)
New message  Schedule a lab  Pt c/o Shortness Of Breath: STAT if SOB developed within the last 24 hours or pt is noticeably SOB on the phone  1. Are you currently SOB (can you hear that pt is SOB on the phone)? No ( Not when he is sitting)  2. How long have you been experiencing SOB? Going on for the last 3  Weeks and its gotten serious within the last week. He can hardly do anything  3. Are you SOB when sitting or when up moving around? Moving around  4.  Are you currently experiencing any other symptoms? No

## 2016-02-02 NOTE — Telephone Encounter (Signed)
Pt c/o of SOB for the past few weeks; pt states he has not missed any medications and he does not check his BP and HR at home. Pt stated that he would like to be seen by Dr. [name] at any office and knows that it is Heart related not something else. Pt stated he is having the SOB only with exertion not when sitting just when walking stairs, or exercising. Pt stated he does not have a cough or think that he is in A. Fib. Pt had not had any chest pain, pressure, swelling, dizziness, or fall. Pt offered a nurse visit for an EKG and vital signs, pt insisted on seeing MD.   Pt scheduled with Dr. [name] on Wed 2/22 @ 7:45am in [facility] office.  Pt verbalized understanding, no questions at this time.

## 2016-02-03 NOTE — Progress Notes (Signed)
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 LHC 10/2009: pLAD 20%, dLAD 30%, mOM2 70-80% (unchanged compared to previous), pRCA 20%, dRCA 20%.  Admitted in February of 2012 with a wide complex tachycardia. Seen by EP and rhythm was most likely felt to be SVT with aberrancy. It was felt that if symptoms recur ablation would be warranted.  Cardiac MRI (09/05/13): Moderate LVE, focal septal hypertrophy, EF 51%, global HK, mild RVE, normal RVSF, no evidence of cardiac amyloid. Nuclear study in March of 2015 showed apical thinning but no ischemia. Ejection fraction was 48%. Echo 11/15 showed EF 123456, grade 2 diastolic dysfunction, mild LAE and mildly dilated aortic root. Patient seen November 8 by Dr. Martinique for increased dyspnea. Amiodarone decreased to 100 mg daily. Chest x-ray showed low-grade CHF with mild interstitial edema. BNP 126. Seen by pulmonary and lasix increased and cardiac MRI ordered. Since last seen, Patient states that in the past 2-3 weeks he has had progressive increased dyspnea on exertion. He has gained 5 pounds. He denies chest pain, palpitations, bleeding or syncope. He states his pedal edema is unchanged.  Current Outpatient Prescriptions  Medication Sig Dispense Refill  . acyclovir (ZOVIRAX) 400 MG tablet Take 1 tablet (400 mg total) by mouth daily. 90 tablet 3  . amiodarone (PACERONE) 200 MG tablet Take 0.5 tablets (100 mg total) by mouth daily. (Patient taking differently: Take 200 mg by mouth daily. ) 90 tablet 3  . furosemide (LASIX) 40 MG tablet Take 1 tablet (40 mg total) by mouth 2 (two) times daily. Take 3 tablets ( 60 mg ) twice a day for 4 days only then 3 tablets ( 60 mg ) daily (Patient taking differently: Take 40 mg by mouth daily. ) 30 tablet   . oxycodone-acetaminophen (LYNOX) 5-300 MG tablet Take 1 tablet by mouth every 8 (eight) hours as needed for pain. 30 tablet 0  . pravastatin  (PRAVACHOL) 40 MG tablet Take 1 tablet (40 mg total) by mouth every evening. 90 tablet 3  . Turmeric 500 MG CAPS Take 1,000 mg by mouth daily.    Alveda Reasons 20 MG TABS tablet TAKE ONE TABLET BY MOUTH ONCE DAILY WITH  SUPPER 90 tablet 1   No current facility-administered medications for this visit.     Past Medical History  Diagnosis Date  . Hypertension   . Coronary atherosclerosis of unspecified type of vessel, native or graft     Non obstructive  . Cerebrovascular disease, unspecified   . Pure hypercholesterolemia   . Diverticulosis of colon (without mention of hemorrhage)   . Benign neoplasm of colon   . Unspecified hemorrhoids without mention of complication   . Degenerative disc disease   . 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 Hemangioma   . Amyloidosis   . LBBB (left bundle branch block)   . Wide-complex tachycardia ([place]) 01/2011    Felt likely be SVT with abbarancy  . OSA (obstructive sleep apnea)   . Atrial fibrillation ([place])   . Cancer (HCC)     amyloidosis  . Edema 08/29/2013  . Hx of cardiovascular stress test     Adenosine Myoview (02/2014): Apical thinning, no ischemia, EF 48%; low risk.  . Scrotal pain 10/23/2014  . Adenomatous colon polyp 2003    max size 71mm  . Traumatic open wound of left lower leg with delayed healing     Seen at  [facility] on 05/29/2015  . 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 Chronic venous insufficiency 2016  . DDD (degenerative disc disease), lumbar     Dr. Katherine Roan  . Cough, persistent 09/29/2015    Past Surgical History  Procedure Laterality Date  . Knee arthroscopy    . Carpal tunnel release  08/2006    Dr Doy Mince at [facility] - Bilateral  . 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 Back surgery  2012    Lumbar, [name] Juncos  . Cardioversion N/A 07/10/2013    Procedure: CARDIOVERSION;  Surgeon: Lelon Perla, MD;  Location: [facility] ENDOSCOPY;   Service: Cardiovascular;  Laterality: N/A;  . Colonoscopy w/ biopsies      Social History   Social History  . Marital Status: Married    Spouse Name: N/A  . Number of Children: 2  . Years of Education: N/A   Occupational History  . PRESIDENT     [employer]   Social History Main Topics  . Smoking status: Never Smoker   . Smokeless tobacco: Never Used  . Alcohol Use: 0.0 oz/week    0 Standard drinks or equivalent per week     Comment: Occassionally  . Drug Use: No  . Sexual Activity: No   Other Topics Concern  . Not on file   Social History Narrative   Lives with wife.      Family History  Problem Relation Age of Onset  . Lung cancer Mother   . Skin cancer Father     Melanoma  . Colon cancer Neg Hx   . Colon polyps Neg Hx   . Kidney disease Neg Hx   . Esophageal cancer Neg Hx   . Diabetes Neg Hx   . Gallbladder disease Neg Hx     ROS: no fevers or chills, productive cough, hemoptysis, dysphasia, odynophagia, melena, hematochezia, dysuria, hematuria, rash, seizure activity, orthopnea, PND, claudication. Remaining systems are negative.  Physical Exam: Well-developed well-nourished in no acute distress.  Skin is warm and dry.  HEENT is normal.  Neck is supple.  Chest is clear to auscultation with normal expansion.  Cardiovascular exam is irregular Abdominal exam nontender or distended. No masses palpated. Extremities show 2+ edema. neuro grossly intact  ECG Atrial fibrillation, left bundle branch block.

## 2016-02-04 ENCOUNTER — Encounter (HOSPITAL_COMMUNITY): Payer: Self-pay | Admitting: Emergency Medicine

## 2016-02-04 ENCOUNTER — Inpatient Hospital Stay (HOSPITAL_COMMUNITY)
Admission: EM | Admit: 2016-02-04 | Discharge: 2016-02-08 | DRG: 308 | Disposition: A | Discharge status: Home / Self Care | Payer: Medicare Other | Attending: Internal Medicine | Admitting: Internal Medicine

## 2016-02-04 ENCOUNTER — Encounter: Payer: Self-pay | Admitting: Cardiology

## 2016-02-04 ENCOUNTER — Emergency Department (HOSPITAL_COMMUNITY): Payer: Medicare Other

## 2016-02-04 ENCOUNTER — Ambulatory Visit (INDEPENDENT_AMBULATORY_CARE_PROVIDER_SITE_OTHER): Payer: Medicare Other | Admitting: Cardiology

## 2016-02-04 ENCOUNTER — Encounter: Payer: Self-pay | Admitting: *Deleted

## 2016-02-04 VITALS — BP 108/70 | HR 106 | Ht 71.0 in | Wt 280.0 lb

## 2016-02-04 DIAGNOSIS — I4891 Unspecified atrial fibrillation: Secondary | ICD-10-CM | POA: Diagnosis not present

## 2016-02-04 DIAGNOSIS — N183 Chronic kidney disease, stage 3 unspecified: Secondary | ICD-10-CM

## 2016-02-04 DIAGNOSIS — I481 Persistent atrial fibrillation: Principal | ICD-10-CM | POA: Diagnosis present

## 2016-02-04 DIAGNOSIS — R55 Syncope and collapse: Secondary | ICD-10-CM | POA: Diagnosis present

## 2016-02-04 DIAGNOSIS — D649 Anemia, unspecified: Secondary | ICD-10-CM | POA: Diagnosis present

## 2016-02-04 DIAGNOSIS — I480 Paroxysmal atrial fibrillation: Secondary | ICD-10-CM | POA: Diagnosis present

## 2016-02-04 DIAGNOSIS — I872 Venous insufficiency (chronic) (peripheral): Secondary | ICD-10-CM | POA: Diagnosis present

## 2016-02-04 DIAGNOSIS — I2510 Atherosclerotic heart disease of native coronary artery without angina pectoris: Secondary | ICD-10-CM | POA: Diagnosis present

## 2016-02-04 DIAGNOSIS — N179 Acute kidney failure, unspecified: Secondary | ICD-10-CM | POA: Diagnosis present

## 2016-02-04 DIAGNOSIS — E859 Amyloidosis, unspecified: Secondary | ICD-10-CM | POA: Diagnosis present

## 2016-02-04 DIAGNOSIS — I5032 Chronic diastolic (congestive) heart failure: Secondary | ICD-10-CM | POA: Diagnosis present

## 2016-02-04 DIAGNOSIS — I679 Cerebrovascular disease, unspecified: Secondary | ICD-10-CM | POA: Diagnosis present

## 2016-02-04 DIAGNOSIS — Z7901 Long term (current) use of anticoagulants: Secondary | ICD-10-CM

## 2016-02-04 DIAGNOSIS — R0609 Other forms of dyspnea: Secondary | ICD-10-CM | POA: Diagnosis not present

## 2016-02-04 DIAGNOSIS — I959 Hypotension, unspecified: Secondary | ICD-10-CM | POA: Diagnosis present

## 2016-02-04 DIAGNOSIS — Z9484 Stem cells transplant status: Secondary | ICD-10-CM

## 2016-02-04 DIAGNOSIS — S00531A Contusion of lip, initial encounter: Secondary | ICD-10-CM | POA: Diagnosis present

## 2016-02-04 DIAGNOSIS — N189 Chronic kidney disease, unspecified: Secondary | ICD-10-CM | POA: Diagnosis present

## 2016-02-04 DIAGNOSIS — T148 Other injury of unspecified body region: Secondary | ICD-10-CM | POA: Diagnosis not present

## 2016-02-04 DIAGNOSIS — I130 Hypertensive heart and chronic kidney disease with heart failure and stage 1 through stage 4 chronic kidney disease, or unspecified chronic kidney disease: Secondary | ICD-10-CM | POA: Diagnosis present

## 2016-02-04 DIAGNOSIS — E785 Hyperlipidemia, unspecified: Secondary | ICD-10-CM | POA: Diagnosis present

## 2016-02-04 DIAGNOSIS — F1020 Alcohol dependence, uncomplicated: Secondary | ICD-10-CM | POA: Diagnosis present

## 2016-02-04 DIAGNOSIS — I4819 Other persistent atrial fibrillation: Secondary | ICD-10-CM

## 2016-02-04 DIAGNOSIS — G4733 Obstructive sleep apnea (adult) (pediatric): Secondary | ICD-10-CM | POA: Diagnosis present

## 2016-02-04 DIAGNOSIS — W19XXXA Unspecified fall, initial encounter: Secondary | ICD-10-CM | POA: Diagnosis present

## 2016-02-04 DIAGNOSIS — I5033 Acute on chronic diastolic (congestive) heart failure: Secondary | ICD-10-CM | POA: Diagnosis present

## 2016-02-04 DIAGNOSIS — S0993XA Unspecified injury of face, initial encounter: Secondary | ICD-10-CM | POA: Diagnosis not present

## 2016-02-04 HISTORY — DX: Reserved for inherently not codable concepts without codable children: IMO0001

## 2016-02-04 LAB — BASIC METABOLIC PANEL
ANION GAP: 15 (ref 5–15)
BUN: 26 mg/dL — ABNORMAL HIGH (ref 7–25)
BUN: 22 mg/dL — ABNORMAL HIGH (ref 6–20)
CALCIUM: 8.7 mg/dL (ref 8.6–10.3)
CALCIUM: 8.5 mg/dL — AB (ref 8.9–10.3)
CHLORIDE: 104 mmol/L (ref 101–111)
CO2: 28 mmol/L (ref 20–31)
CO2: 23 mmol/L (ref 22–32)
Chloride: 106 mmol/L (ref 98–110)
Creat: 1.9 mg/dL — ABNORMAL HIGH (ref 0.70–1.18)
Creatinine, Ser: 1.76 mg/dL — ABNORMAL HIGH (ref 0.61–1.24)
GFR calc Af Amer: 42 mL/min — ABNORMAL LOW (ref >60)
GFR calc non Af Amer: 36 mL/min — ABNORMAL LOW (ref >60)
GLUCOSE: 104 mg/dL — AB (ref 65–99)
Glucose, Bld: 91 mg/dL (ref 65–99)
Potassium: 4.5 mmol/L (ref 3.5–5.3)
Potassium: 3.6 mmol/L (ref 3.5–5.1)
SODIUM: 142 mmol/L (ref 135–146)
Sodium: 142 mmol/L (ref 135–145)

## 2016-02-04 LAB — CBG MONITORING, ED: GLUCOSE-CAPILLARY: 97 mg/dL (ref 65–99)

## 2016-02-04 LAB — CBC
HEMATOCRIT: 36.7 % — AB (ref 39.0–52.0)
HEMATOCRIT: 35.2 % — AB (ref 39.0–52.0)
HEMOGLOBIN: 11.4 g/dL — AB (ref 13.0–17.0)
HEMOGLOBIN: 11 g/dL — AB (ref 13.0–17.0)
MCH: 27.9 pg (ref 26.0–34.0)
MCH: 28.7 pg (ref 26.0–34.0)
MCHC: 31.1 g/dL (ref 30.0–36.0)
MCHC: 31.3 g/dL (ref 30.0–36.0)
MCV: 90 fL (ref 78.0–100.0)
MCV: 91.9 fL (ref 78.0–100.0)
PLATELETS: 292 10*3/uL (ref 150–400)
Platelets: 266 10*3/uL (ref 150–400)
RBC: 4.08 MIL/uL — AB (ref 4.22–5.81)
RBC: 3.83 MIL/uL — ABNORMAL LOW (ref 4.22–5.81)
RDW: 25.4 % — ABNORMAL HIGH (ref 11.5–15.5)
RDW: 25.8 % — ABNORMAL HIGH (ref 11.5–15.5)
WBC: 6.5 10*3/uL (ref 4.0–10.5)
WBC: 7 10*3/uL (ref 4.0–10.5)

## 2016-02-04 LAB — HEPATIC FUNCTION PANEL
ALT: 15 U/L (ref 9–46)
AST: 17 U/L (ref 10–35)
Albumin: 3.4 g/dL — ABNORMAL LOW (ref 3.6–5.1)
Alkaline Phosphatase: 86 U/L (ref 40–115)
BILIRUBIN DIRECT: 0.4 mg/dL — AB (ref <=0.2)
BILIRUBIN INDIRECT: 0.8 mg/dL (ref 0.2–1.2)
TOTAL PROTEIN: 6 g/dL — AB (ref 6.1–8.1)
Total Bilirubin: 1.2 mg/dL (ref 0.2–1.2)

## 2016-02-04 MED ORDER — AMIODARONE HCL 200 MG PO TABS: 200.0000 mg | ORAL_TABLET | Freq: Every day | ORAL | Status: DC

## 2016-02-04 MED ORDER — FUROSEMIDE 40 MG PO TABS: 40.0000 mg | ORAL_TABLET | Freq: Two times a day (BID) | ORAL | Status: DC

## 2016-02-04 NOTE — ED Notes (Signed)
EDP at bedside  

## 2016-02-04 NOTE — Assessment & Plan Note (Signed)
Continue amiodarone. 

## 2016-02-04 NOTE — Assessment & Plan Note (Signed)
Management per oncology. 

## 2016-02-04 NOTE — Patient Instructions (Signed)
Medication Instructions:   INCREASE FUROSEMIDE TO 40 MG TWICE DAILY= 2 OF THE 20 MG TABLETS TWICE DAILY  INCREASE AMIODARONE TO 400 MG TWICE DAILY X 2 WEEKS= 2 OF THE 200 MG TABLETS TWICE DAILY X 2 WEEKS THEN REDUCE TO ONE TABLET DAILY  Labwork:  Your physician recommends that you HAVE LAB WORK TODAY  Your physician recommends that you return for lab work in: [place]  Testing/Procedures:  Your physician has requested that you have an echocardiogram. Echocardiography is a painless test that uses sound waves to create images of your heart. It provides your doctor with information about the size and shape of your heart and how well your heart's chambers and valves are working. This procedure takes approximately one hour. There are no restrictions for this procedure.   Your physician has recommended that you have a Cardioversion (DCCV). Electrical Cardioversion uses a jolt of electricity to your heart either through paddles or wired patches attached to your chest. This is a controlled, usually prescheduled, procedure. Defibrillation is done under light anesthesia in the hospital, and you usually go home the day of the procedure. This is done to get your heart back into a normal rhythm. You are not awake for the procedure. Please see the instruction sheet given to you today.    Follow-Up:  Your physician recommends that you schedule a follow-up appointment in: [place]

## 2016-02-04 NOTE — ED Notes (Signed)
Pt in EMS from home, pt had syncopal episode witnessed by wife. Pt does not remember what happened. Pt currently A/OX4. Hx afib, supposed to have elected cardioversion on Friday. Initially HR 40's then jumped to 130's. BP stable. Pt has abrasions to hands, lip, and had nose bleed.

## 2016-02-04 NOTE — Assessment & Plan Note (Signed)
Continue statin. 

## 2016-02-04 NOTE — Assessment & Plan Note (Signed)
Patient has developed recurrent atrial fibrillation which is extremely symptomatic. Increase amiodarone to 400 mg twice a day. Continue for 2 weeks and then decrease back to 200 mg daily. Continue xarelto and patient has not missed doses. We will arrange cardioversion with next available which should improve his symptoms. Check hemoglobin, renal function, TSH and liver functions. Repeat echocardiogram.

## 2016-02-04 NOTE — Assessment & Plan Note (Signed)
Continue statin.No aspirin given need for anticoagulation. 

## 2016-02-04 NOTE — Assessment & Plan Note (Signed)
Patient has developed worsening diastolic congestive heart failure likely related to his atrial fibrillation. Increase Lasix to 40 mg twice a day. Check potassium and renal function in 1 week.

## 2016-02-04 NOTE — Assessment & Plan Note (Signed)
Blood pressure controlled. Continue present medications. 

## 2016-02-04 NOTE — ED Notes (Signed)
Pt refusing to get in gown, states he wants to be discharged and have his cardioversion on Friday

## 2016-02-05 ENCOUNTER — Other Ambulatory Visit: Payer: Self-pay | Admitting: Internal Medicine

## 2016-02-05 ENCOUNTER — Encounter (HOSPITAL_COMMUNITY): Payer: Self-pay | Admitting: Internal Medicine

## 2016-02-05 DIAGNOSIS — I959 Hypotension, unspecified: Secondary | ICD-10-CM | POA: Diagnosis present

## 2016-02-05 DIAGNOSIS — I130 Hypertensive heart and chronic kidney disease with heart failure and stage 1 through stage 4 chronic kidney disease, or unspecified chronic kidney disease: Secondary | ICD-10-CM | POA: Diagnosis present

## 2016-02-05 DIAGNOSIS — I481 Persistent atrial fibrillation: Principal | ICD-10-CM

## 2016-02-05 DIAGNOSIS — I5032 Chronic diastolic (congestive) heart failure: Secondary | ICD-10-CM | POA: Diagnosis not present

## 2016-02-05 DIAGNOSIS — E859 Amyloidosis, unspecified: Secondary | ICD-10-CM | POA: Diagnosis not present

## 2016-02-05 DIAGNOSIS — S00531A Contusion of lip, initial encounter: Secondary | ICD-10-CM | POA: Diagnosis present

## 2016-02-05 DIAGNOSIS — I480 Paroxysmal atrial fibrillation: Secondary | ICD-10-CM | POA: Diagnosis not present

## 2016-02-05 DIAGNOSIS — I4819 Other persistent atrial fibrillation: Secondary | ICD-10-CM | POA: Diagnosis present

## 2016-02-05 DIAGNOSIS — N179 Acute kidney failure, unspecified: Secondary | ICD-10-CM | POA: Diagnosis present

## 2016-02-05 DIAGNOSIS — Z9484 Stem cells transplant status: Secondary | ICD-10-CM | POA: Diagnosis not present

## 2016-02-05 DIAGNOSIS — N183 Chronic kidney disease, stage 3 (moderate): Secondary | ICD-10-CM | POA: Diagnosis not present

## 2016-02-05 DIAGNOSIS — N189 Chronic kidney disease, unspecified: Secondary | ICD-10-CM | POA: Diagnosis present

## 2016-02-05 DIAGNOSIS — R55 Syncope and collapse: Secondary | ICD-10-CM | POA: Diagnosis present

## 2016-02-05 DIAGNOSIS — D649 Anemia, unspecified: Secondary | ICD-10-CM | POA: Diagnosis present

## 2016-02-05 DIAGNOSIS — I5033 Acute on chronic diastolic (congestive) heart failure: Secondary | ICD-10-CM | POA: Diagnosis not present

## 2016-02-05 DIAGNOSIS — I2510 Atherosclerotic heart disease of native coronary artery without angina pectoris: Secondary | ICD-10-CM | POA: Diagnosis not present

## 2016-02-05 DIAGNOSIS — I129 Hypertensive chronic kidney disease with stage 1 through stage 4 chronic kidney disease, or unspecified chronic kidney disease: Secondary | ICD-10-CM | POA: Diagnosis not present

## 2016-02-05 DIAGNOSIS — G4733 Obstructive sleep apnea (adult) (pediatric): Secondary | ICD-10-CM | POA: Diagnosis present

## 2016-02-05 DIAGNOSIS — E858 Other amyloidosis: Secondary | ICD-10-CM | POA: Diagnosis not present

## 2016-02-05 DIAGNOSIS — F1020 Alcohol dependence, uncomplicated: Secondary | ICD-10-CM | POA: Diagnosis present

## 2016-02-05 DIAGNOSIS — Z7901 Long term (current) use of anticoagulants: Secondary | ICD-10-CM | POA: Diagnosis not present

## 2016-02-05 DIAGNOSIS — I679 Cerebrovascular disease, unspecified: Secondary | ICD-10-CM | POA: Diagnosis present

## 2016-02-05 DIAGNOSIS — I4891 Unspecified atrial fibrillation: Secondary | ICD-10-CM | POA: Diagnosis not present

## 2016-02-05 DIAGNOSIS — I872 Venous insufficiency (chronic) (peripheral): Secondary | ICD-10-CM | POA: Diagnosis present

## 2016-02-05 DIAGNOSIS — W19XXXA Unspecified fall, initial encounter: Secondary | ICD-10-CM | POA: Diagnosis present

## 2016-02-05 DIAGNOSIS — E785 Hyperlipidemia, unspecified: Secondary | ICD-10-CM | POA: Diagnosis present

## 2016-02-05 LAB — COMPREHENSIVE METABOLIC PANEL
ALK PHOS: 68 U/L (ref 38–126)
ALT: 19 U/L (ref 17–63)
AST: 19 U/L (ref 15–41)
Albumin: 3.1 g/dL — ABNORMAL LOW (ref 3.5–5.0)
Anion gap: 9 (ref 5–15)
BILIRUBIN TOTAL: 1.6 mg/dL — AB (ref 0.3–1.2)
BUN: 21 mg/dL — AB (ref 6–20)
CALCIUM: 8.2 mg/dL — AB (ref 8.9–10.3)
CO2: 27 mmol/L (ref 22–32)
CREATININE: 1.73 mg/dL — AB (ref 0.61–1.24)
Chloride: 105 mmol/L (ref 101–111)
GFR, EST AFRICAN AMERICAN: 42 mL/min — AB (ref >60)
GFR, EST NON AFRICAN AMERICAN: 37 mL/min — AB (ref >60)
Glucose, Bld: 107 mg/dL — ABNORMAL HIGH (ref 65–99)
Potassium: 3.9 mmol/L (ref 3.5–5.1)
Sodium: 141 mmol/L (ref 135–145)
TOTAL PROTEIN: 5.7 g/dL — AB (ref 6.5–8.1)

## 2016-02-05 LAB — CBC WITH DIFFERENTIAL/PLATELET
Basophils Absolute: 0.2 10*3/uL — ABNORMAL HIGH (ref 0.0–0.1)
Basophils Relative: 2 %
EOS ABS: 0 10*3/uL (ref 0.0–0.7)
Eosinophils Relative: 0 %
HCT: 33.5 % — ABNORMAL LOW (ref 39.0–52.0)
Hemoglobin: 10.4 g/dL — ABNORMAL LOW (ref 13.0–17.0)
LYMPHS ABS: 0.8 10*3/uL (ref 0.7–4.0)
Lymphocytes Relative: 10 %
MCH: 28 pg (ref 26.0–34.0)
MCHC: 31 g/dL (ref 30.0–36.0)
MCV: 90.3 fL (ref 78.0–100.0)
MONO ABS: 1.8 10*3/uL — AB (ref 0.1–1.0)
Monocytes Relative: 23 %
NEUTROS PCT: 65 %
Neutro Abs: 5.2 10*3/uL (ref 1.7–7.7)
PLATELETS: 239 10*3/uL (ref 150–400)
RBC: 3.71 MIL/uL — AB (ref 4.22–5.81)
RDW: 25.9 % — AB (ref 11.5–15.5)
WBC: 8 10*3/uL (ref 4.0–10.5)

## 2016-02-05 LAB — URINALYSIS, ROUTINE W REFLEX MICROSCOPIC
Bilirubin Urine: NEGATIVE
Glucose, UA: NEGATIVE mg/dL
Hgb urine dipstick: NEGATIVE
Ketones, ur: NEGATIVE mg/dL
LEUKOCYTES UA: NEGATIVE
Nitrite: NEGATIVE
PROTEIN: NEGATIVE mg/dL
Specific Gravity, Urine: 1.01 (ref 1.005–1.030)
pH: 5 (ref 5.0–8.0)

## 2016-02-05 LAB — TSH
TSH: 2.56 m[IU]/L (ref 0.40–4.50)
TSH: 1.611 u[IU]/mL (ref 0.350–4.500)

## 2016-02-05 LAB — TROPONIN I
TROPONIN I: 0.03 ng/mL (ref <0.031)
Troponin I: 0.03 ng/mL (ref <0.031)
Troponin I: 0.03 ng/mL (ref <0.031)

## 2016-02-05 LAB — PATHOLOGIST SMEAR REVIEW

## 2016-02-05 MED ORDER — ONDANSETRON HCL 4 MG/2ML IJ SOLN: 4.0000 mg | Freq: Four times a day (QID) | INTRAMUSCULAR | Status: DC | PRN

## 2016-02-05 MED ORDER — ONDANSETRON HCL 4 MG PO TABS: 4.0000 mg | ORAL_TABLET | Freq: Four times a day (QID) | ORAL | Status: DC | PRN

## 2016-02-05 MED ORDER — TRAMADOL HCL 50 MG PO TABS: 50.0000 mg | ORAL_TABLET | Freq: Every day | ORAL | Status: DC | PRN

## 2016-02-05 MED ORDER — ACETAMINOPHEN 325 MG PO TABS: 650.0000 mg | ORAL_TABLET | Freq: Four times a day (QID) | ORAL | Status: DC | PRN

## 2016-02-05 MED ORDER — FOLIC ACID 1 MG PO TABS
1.0000 mg | ORAL_TABLET | Freq: Every day | ORAL | Status: DC
  Administered 2016-02-05 – 2016-02-08 (×4): 1 mg via ORAL
  Filled 2016-02-05 (×4): qty 1

## 2016-02-05 MED ORDER — DILTIAZEM HCL 30 MG PO TABS
30.0000 mg | ORAL_TABLET | Freq: Once | ORAL | Status: AC
  Administered 2016-02-05: 30 mg via ORAL
  Filled 2016-02-05: qty 1

## 2016-02-05 MED ORDER — AMIODARONE HCL 200 MG PO TABS
200.0000 mg | ORAL_TABLET | Freq: Once | ORAL | Status: DC
  Filled 2016-02-05: qty 1

## 2016-02-05 MED ORDER — AMIODARONE HCL IN DEXTROSE 360-4.14 MG/200ML-% IV SOLN
30.0000 mg/h | INTRAVENOUS | Status: DC
  Administered 2016-02-05 – 2016-02-06 (×2): 30 mg/h via INTRAVENOUS
  Filled 2016-02-05: qty 200

## 2016-02-05 MED ORDER — AMIODARONE HCL 200 MG PO TABS: 400.0000 mg | ORAL_TABLET | Freq: Two times a day (BID) | ORAL | Status: DC

## 2016-02-05 MED ORDER — LORAZEPAM 1 MG PO TABS: 0.0000 mg | ORAL_TABLET | Freq: Two times a day (BID) | ORAL | Status: DC

## 2016-02-05 MED ORDER — THIAMINE HCL 100 MG/ML IJ SOLN: 100.0000 mg | Freq: Every day | INTRAMUSCULAR | Status: DC

## 2016-02-05 MED ORDER — PRAVASTATIN SODIUM 40 MG PO TABS
40.0000 mg | ORAL_TABLET | Freq: Every morning | ORAL | Status: DC
  Administered 2016-02-05 – 2016-02-08 (×4): 40 mg via ORAL
  Filled 2016-02-05 (×4): qty 1

## 2016-02-05 MED ORDER — LORAZEPAM 2 MG/ML IJ SOLN: 1.0000 mg | Freq: Four times a day (QID) | INTRAMUSCULAR | Status: AC | PRN

## 2016-02-05 MED ORDER — AMIODARONE HCL IN DEXTROSE 360-4.14 MG/200ML-% IV SOLN
60.0000 mg/h | INTRAVENOUS | Status: AC
  Administered 2016-02-05: 60 mg/h via INTRAVENOUS
  Filled 2016-02-05 (×2): qty 200

## 2016-02-05 MED ORDER — LORAZEPAM 1 MG PO TABS: 0.0000 mg | ORAL_TABLET | Freq: Four times a day (QID) | ORAL | Status: AC

## 2016-02-05 MED ORDER — AMIODARONE HCL 200 MG PO TABS
200.0000 mg | ORAL_TABLET | Freq: Two times a day (BID) | ORAL | Status: DC
  Administered 2016-02-05: 200 mg via ORAL
  Filled 2016-02-05: qty 1

## 2016-02-05 MED ORDER — FUROSEMIDE 10 MG/ML IJ SOLN
40.0000 mg | Freq: Every day | INTRAMUSCULAR | Status: DC
  Administered 2016-02-06: 40 mg via INTRAVENOUS
  Filled 2016-02-05: qty 4

## 2016-02-05 MED ORDER — ADULT MULTIVITAMIN W/MINERALS CH
1.0000 | ORAL_TABLET | Freq: Every day | ORAL | Status: DC
  Administered 2016-02-05 – 2016-02-08 (×4): 1 via ORAL
  Filled 2016-02-05 (×4): qty 1

## 2016-02-05 MED ORDER — ACETAMINOPHEN 650 MG RE SUPP: 650.0000 mg | Freq: Four times a day (QID) | RECTAL | Status: DC | PRN

## 2016-02-05 MED ORDER — VITAMIN B-1 100 MG PO TABS
100.0000 mg | ORAL_TABLET | Freq: Every day | ORAL | Status: DC
  Administered 2016-02-05 – 2016-02-08 (×4): 100 mg via ORAL
  Filled 2016-02-05 (×5): qty 1

## 2016-02-05 MED ORDER — LORAZEPAM 1 MG PO TABS
1.0000 mg | ORAL_TABLET | Freq: Four times a day (QID) | ORAL | Status: AC | PRN
  Administered 2016-02-05: 1 mg via ORAL
  Filled 2016-02-05: qty 1

## 2016-02-05 MED ORDER — RIVAROXABAN 15 MG PO TABS
15.0000 mg | ORAL_TABLET | Freq: Every day | ORAL | Status: DC
  Administered 2016-02-05 – 2016-02-06 (×2): 15 mg via ORAL
  Filled 2016-02-05 (×2): qty 1

## 2016-02-05 NOTE — Consult Note (Signed)
Cardiology Consult    Patient ID: Johnny Mitchell MRN: [id number], DOB/AGE: [birth year]   Admit date: 02/04/2016 Date of Consult: 02/05/2016  Primary Physician: [name], MD Primary Cardiologist: [name] Requesting Provider: [name]  Patient Profile    [phone] with history of AF presenting with syncope  Past Medical History   Past Medical History  Diagnosis Date  . Hypertension   . Coronary atherosclerosis of unspecified type of vessel, native or graft     Non obstructive  . Cerebrovascular disease, unspecified   . Pure hypercholesterolemia   . Diverticulosis of colon (without mention of hemorrhage)   . Benign neoplasm of colon   . Unspecified hemorrhoids without mention of complication   . Degenerative disc disease   . 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 Hemangioma   . Amyloidosis   . LBBB (left bundle branch block)   . Wide-complex tachycardia ([place]) 01/2011    Felt likely be SVT with abbarancy  . OSA (obstructive sleep apnea)   . Atrial fibrillation ([place])   . Cancer (HCC)     amyloidosis  . Edema 08/29/2013  . Hx of cardiovascular stress test     Adenosine Myoview (02/2014): Apical thinning, no ischemia, EF 48%; low risk.  . Scrotal pain 10/23/2014  . Adenomatous colon polyp 2003    max size 29mm  . Traumatic open wound of left lower leg with delayed healing     Seen at [facility] on 05/29/2015  . 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)     Seen at [facility] on 05/29/2015  . Chronic venous insufficiency 2016  . DDD (degenerative disc disease), lumbar     Dr. Katherine Roan  . Cough, persistent 09/29/2015    Past Surgical History  Procedure Laterality Date  . Knee arthroscopy    . Carpal tunnel release  08/2006    Dr Doy Mince at [facility] - Bilateral  . 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 Back surgery  2012    [place], [name]   . Cardioversion N/A 07/10/2013    Procedure: CARDIOVERSION;  Surgeon: Lelon Perla, MD;  Location: [facility] ENDOSCOPY;  Service: Cardiovascular;  Laterality: N/A;  . Colonoscopy w/ biopsies       Allergies  No Known Allergies  History of Present Illness    The patient is a 75 year old man with a known history of atrial fibrillation and nonobstructive coronary artery disease presenting with an episode of syncope in the setting of AF.  The patient reports that he has been feeling increasingly short of breath over the last couple of days.  He was evaluated by his cardiologist Dr. [name] on February 22 for shortness of breath and was found to be in atrial fibrillation.  The plan at that time was to pursue elective DC cardioversion on February 24.  However, over the course of the day the patient developed worsening shortness of breath.  In the evening, right after he had finished dinner, he got up to walk to the kitchen and subsequently had a syncopal episode.  He describes no prodrome and states that he was walking and lost consciousness and fell to the ground.  His wife witnessed the entire episode.  And reports that for a few seconds he started turning blue as if he was not breathing.  She does not know the exact duration that he had lost consciousness, but reports that she called 911 and attempted to perform some compressions.  The patient then regained consciousness and did not have anything that resembled a postictal period.  He did notice that he had cut his lip and bruised his arm.  Given the syncopal episode, the patient and his wife presented to the ED  In the ED, the patient had a heart rate of 115 minute initial blood pressure of 94/74.  He described mild shortness of breath but was otherwise asymptomatic.  CT had revealed no acute intracranial process.  He was found to be in atrial fibrillation.  We are consulted regarding additional evaluation and management of his syncope and atrial fibrillation.  Inpatient Medications    Per primary H&P  Family History    Family  History  Problem Relation Age of Onset  . Lung cancer Mother   . Skin cancer Father     Melanoma  . Colon cancer Neg Hx   . Colon polyps Neg Hx   . Kidney disease Neg Hx   . Esophageal cancer Neg Hx   . Diabetes Neg Hx   . Gallbladder disease Neg Hx     Social History    Social History   Social History  . Marital Status: Married    Spouse Name: N/A  . Number of Children: 2  . Years of Education: N/A   Occupational History  . PRESIDENT     [employer]   Social History Main Topics  . Smoking status: Never Smoker   . Smokeless tobacco: Never Used  . Alcohol Use: 0.0 oz/week    0 Standard drinks or equivalent per week     Comment: Occassionally  . Drug Use: No  . Sexual Activity: No   Other Topics Concern  . Not on file   Social History Narrative   Lives with wife.       Review of Systems    General:  No chills, fever, night sweats or weight changes. Dried blood on lips Cardiovascular:  No chest pain, dyspnea on exertion, edema, orthopnea, palpitations, paroxysmal nocturnal dyspnea. Dermatological: No rash, lesions/masses Respiratory: No cough, dyspnea Urologic: No hematuria, dysuria Abdominal:   No nausea, vomiting, diarrhea, bright red blood per rectum, melena, or hematemesis Neurologic:  No visual changes, wkns, changes in mental status. All other systems reviewed and are otherwise negative except as noted above.  Physical Exam    Blood pressure 110/88, pulse 123, temperature 99.4 F (37.4 C), temperature source Oral, resp. rate 18, height 5\' 11"  (1.803 m), weight 280 lb (127.007 kg), SpO2 96 %.  General: Pleasant, NAD Psych: Normal affect. Neuro: Alert and oriented X 3. Moves all extremities spontaneously. HEENT: Normal  Neck: Supple without bruits or JVD. Lungs:  Resp regular and unlabored, CTA. Heart: IRIR, tachycardic, no s3, s4, or murmurs. Abdomen: Soft, non-tender, non-distended, BS + x 4.  Extremities: No clubbing, cyanosis. 1-2+ BLE  edema. DP/PT/Radials 2+ and equal bilaterally.  Labs    Troponin (Point of Care Test) No results for input(s): TROPIPOC in the last 72 hours. No results for input(s): CKTOTAL, CKMB, TROPONINI in the last 72 hours. Lab Results  Component Value Date   WBC 7.0 02/04/2016   HGB 11.0* 02/04/2016   HCT 35.2* 02/04/2016   MCV 91.9 02/04/2016   PLT 266 02/04/2016    Recent Labs Lab 02/04/16 0932 02/04/16 2227  NA 142 142  K 4.5 3.6  CL 106 104  CO2 28 23  BUN 26* 22*  CREATININE 1.90* 1.76*  CALCIUM 8.7 8.5*  PROT 6.0*  --  BILITOT 1.2  --   ALKPHOS 86  --   ALT 15  --   AST 17  --   GLUCOSE 91 104*   Lab Results  Component Value Date   CHOL 155 01/15/2009   HDL 39.9 01/15/2009   LDLCALC 100* 01/15/2009   TRIG 77 01/15/2009   No results found for: [facility]   Radiology Studies    Ct Head Wo Contrast  02/04/2016  CLINICAL DATA:  Acute onset of syncope. Bradycardia, followed by tachycardia. Initial encounter. EXAM: CT HEAD WITHOUT CONTRAST TECHNIQUE: Contiguous axial images were obtained from the base of the skull through the vertex without intravenous contrast. COMPARISON:  None. FINDINGS: There is no evidence of acute infarction, mass lesion, or intra- or extra-axial hemorrhage on CT. Prominence of the ventricles and sulci reflects mild cortical volume loss. Diffuse periventricular and subcortical white matter change likely reflects small vessel ischemic microangiopathy. Mild cerebellar atrophy is noted. Mild chronic ischemic change extends into the basal ganglia bilaterally. The brainstem and fourth ventricle are within normal limits. The cerebral hemispheres demonstrate grossly normal gray-white differentiation. No mass effect or midline shift is seen. There is no evidence of fracture; visualized osseous structures are unremarkable in appearance. The orbits are within normal limits. The paranasal sinuses and mastoid air cells are well-aerated. Mild soft tissue swelling is noted  overlying the left frontal calvarium. IMPRESSION: 1. No acute intracranial pathology seen on CT. 2. Mild cortical volume loss and diffuse small vessel ischemic microangiopathy. 3. Mild chronic ischemic change at the basal ganglia bilaterally. 4. Mild soft swelling overlying the left frontal calvarium. Electronically Signed   By: Garald Balding M.D.   On: 02/04/2016 23:53    ECG & Cardiac Imaging    Atrial fibrillation  Assessment & Plan    1.  Atrial fibrillation 2.  Syncope 3. Hyperlipidemia  The patient is a 75 year old man with a known history of atrial fibrillation and nonobstructive coronary artery disease presenting with an episode of syncope in the setting of AF.  The etiology of his syncope is unclear but concerning for a cardiac etiology.  The patient describes no prodrome suggestive of neurocardiogenic or vasovagal syncope.  It is unclear if his atrial fibrillation could be contributing to this episode, although it would be somewhat unusual.  At this point, would suggest the following.  -Please continue to monitor the patient on telemetry for any pauses or wide complex tachycardia that could explain his syncope -Please continue to cycle cardiac biomarkers. - continue amiodarone - continue pravastatin -I would consider DC cardioversion of his atrial fibrillation during his hospitalization.  He has been on long-standing rivaroxaban. -continue rivaroxaban; given his renal insufficiency would consider dosing at 15mg  daily (CrCl 15-50 should be 15mg  daily)   Signed, Raliegh Ip, MD MPH 02/05/2016, 2:18 AM

## 2016-02-05 NOTE — Progress Notes (Signed)
Patient seen and examined, in afib/rvr, bp stable, denies chest pain, no sob, lung clear, does has bilateral lower extremity pitting edema, appreciate cardiology input, on amiodarone, and plan for cardioversion tomorrow.

## 2016-02-05 NOTE — ED Notes (Signed)
HH lunch ordered for pt.

## 2016-02-05 NOTE — ED Notes (Signed)
Heart Healthy Breakfast tray ordered @ 0601.

## 2016-02-05 NOTE — Progress Notes (Signed)
Patient Name: Johnny Mitchell Date of Encounter: 02/05/2016   Pt. Profile: The patient is a 75 year old man with a known history of atrial fibrillation, diastolic CHF, amyloidosis, hyperlipidemia and nonobstructive coronary artery disease who was seen by Dr. [name] 02/04/16. He was scheduled for DCCV 2/24 due to extremely symptomatic afib and increase lasix to 40mg  BID due to dyspnea. However brought by EMS overnight  with an episode of syncope in the setting of AF RVR.    SUBJECTIVE  Currently denies chest pain or sob.   CURRENT MEDS . amiodarone  200 mg Oral BID  . folic acid  1 mg Oral Daily  . LORazepam  0-4 mg Oral Q6H   Followed by  . [START ON 02/07/2016] LORazepam  0-4 mg Oral Q12H  . multivitamin with minerals  1 tablet Oral Daily  . pravastatin  40 mg Oral q morning - 10a  . rivaroxaban  15 mg Oral Q breakfast  . thiamine  100 mg Oral Daily   Or  . thiamine  100 mg Intravenous Daily    OBJECTIVE  Filed Vitals:   02/05/16 0930 02/05/16 1000 02/05/16 1015 02/05/16 1200  BP: 109/90 114/87  131/70  Pulse: 99 105 101 95  Temp:      TempSrc:      Resp: 21 18 18 11   Height:      Weight:      SpO2: 98% 100% 98% 94%   No intake or output data in the 24 hours ending 02/05/16 1235 Filed Weights   02/04/16 2136  Weight: 280 lb (127.007 kg)    PHYSICAL EXAM  General: Pleasant, NAD. Neuro: Alert and oriented X 3. Moves all extremities spontaneously. Psych: Normal affect. HEENT:  Normal  Neck: Supple without bruits. + JVD. Lungs:  Resp regular and unlabored, CTA. Heart: Ir IR with tachycardia no s3, s4, or murmurs. Abdomen: Soft, non-tender, non-distended, BS + x 4.  Extremities: No clubbing, cyanosis. 2+ BL LE edema. DP 1 + and equal bilaterally.  Accessory Clinical Findings  CBC  Recent Labs  02/04/16 2227 02/05/16 0609  WBC 7.0 8.0  NEUTROABS  --  5.2  HGB 11.0* 10.4*  HCT 35.2* 33.5*  MCV 91.9 90.3  PLT 266 [id number]   Basic Metabolic  Panel  Recent Labs  02/04/16 2227 02/05/16 0609  NA 142 141  K 3.6 3.9  CL 104 105  CO2 23 27  GLUCOSE 104* 107*  BUN 22* 21*  CREATININE 1.76* 1.73*  CALCIUM 8.5* 8.2*   Liver Function Tests  Recent Labs  02/04/16 0932 02/05/16 0609  AST 17 19  ALT 15 19  ALKPHOS 86 68  BILITOT 1.2 1.6*  PROT 6.0* 5.7*  ALBUMIN 3.4* 3.1*   No results for input(s): LIPASE, AMYLASE in the last 72 hours. Cardiac Enzymes  Recent Labs  02/05/16 0609  TROPONINI 0.03   Thyroid Function Tests  Recent Labs  02/05/16 0609  TSH 1.611    TELE  afib at rate of 100-120s  Radiology/Studies  Ct Head Wo Contrast  02/04/2016  CLINICAL DATA:  Acute onset of syncope. Bradycardia, followed by tachycardia. Initial encounter. EXAM: CT HEAD WITHOUT CONTRAST TECHNIQUE: Contiguous axial images were obtained from the base of the skull through the vertex without intravenous contrast. COMPARISON:  None. FINDINGS: There is no evidence of acute infarction, mass lesion, or intra- or extra-axial hemorrhage on CT. Prominence of the ventricles and sulci reflects mild cortical volume loss. Diffuse periventricular and subcortical white matter  change likely reflects small vessel ischemic microangiopathy. Mild cerebellar atrophy is noted. Mild chronic ischemic change extends into the basal ganglia bilaterally. The brainstem and fourth ventricle are within normal limits. The cerebral hemispheres demonstrate grossly normal gray-white differentiation. No mass effect or midline shift is seen. There is no evidence of fracture; visualized osseous structures are unremarkable in appearance. The orbits are within normal limits. The paranasal sinuses and mastoid air cells are well-aerated. Mild soft tissue swelling is noted overlying the left frontal calvarium. IMPRESSION: 1. No acute intracranial pathology seen on CT. 2. Mild cortical volume loss and diffuse small vessel ischemic microangiopathy. 3. Mild chronic ischemic change  at the basal ganglia bilaterally. 4. Mild soft swelling overlying the left frontal calvarium. Electronically Signed   By: Garald Balding M.D.   On: 02/04/2016 23:53    ASSESSMENT AND PLAN  The patient is a 75 year old man with a known history of atrial fibrillation, diastolic CHF, amyloidosis, hyperlipidemia and nonobstructive coronary artery disease who was seen by Dr. [name] 02/04/16. He was scheduled for DCCV 2/24 due to extremely symptomatic afib and increased  amiodarone to 400 mg twice a day for 2 weeks and increase lasix to 40mg  BID due to dyspnea. However brought by EMS overnight  with an episode of syncope in the setting of AF RVR.   1. Afib with RVR - Plan was to start Amiodarone 400mg  BID. He took 400mg  yesterday. However given 200mg  today, will give another 200mg  now and change to 400mg  BID for 2 weeks. His rate in 100-120s. BP was soft low during admission, now normal. Might need IV amiodarone for better rate controlled or another rate controlled medication. DCCV tomorrow as schedule. Continue Xarelto 15mg  for anticoagulation. MD to see.   2. Acute on chronic diastolic CHF - Increased lasix to 40mg  BID yesterday. However hold by IM due to syncope and hypotension upon presentation.  BP stable. He is volume overloaded on exam. Consider resuming IV lasix 40mg . Follow Scr closely.   3. Syncopy - The etiology of his syncope is unclear but concerning for a cardiac etiology. Differential include hypotension. Tele observation.   4. AonCKD, stage III - Follow SCr closely  Signed, Aynsley Fleet PA-C Pager [phone]  .

## 2016-02-05 NOTE — H&P (Signed)
[facility] History and Physical  Johnny Mitchell [id number] DOB: 10-Jul-1941 DOA: 02/04/2016  Referring physician: ER physician. PCP: [name], MD  Specialists: Dr. [name]. Cardiologist.  Chief Complaint: Loss of consciousness.  HPI: Johnny Mitchell is a 75 y.o. male with history of chronic atrial fibrillation, chronic diastolic CHF, amyloidosis, hyperlipidemia, nonobstructive CAD who had followed up with cardiologist yesterday and had patient's amiodarone dose increased along with Lasix due to symptomatic A. fib and diastolic CHF and was originally planned to have cardioversion done tomorrow presents to the ER after patient had a syncopal episode at home. Patient states he had taken his increased dose of amiodarone and Lasix last evening at his office and had a couple of alcoholic drinks and went home. At home after standing from a sitting position suddenly loss consciousness without any prodromal symptoms. Patient's wife witnessed the episode. Patient fell on his face and had hurt his nose which had bled. Patient regained consciousness spontaneously. Patient was brought to the ER. Patient was in A. fib at a rate going to 140s. Creatinine has increased from baseline. Cardiologist on-call was consulted. Patient is being admitted for further management of syncope. Denies any chest pain or shortness of breath at this time. Moves all extremities without difficulty.   Review of Systems: As presented in the history of presenting illness, rest negative.  Past Medical History  Diagnosis Date  . Hypertension   . Coronary atherosclerosis of unspecified type of vessel, native or graft     Non obstructive  . Cerebrovascular disease, unspecified   . Pure hypercholesterolemia   . Diverticulosis of colon (without mention of hemorrhage)   . Benign neoplasm of colon   . Unspecified hemorrhoids without mention of complication   . Degenerative disc disease   . Spondylosis of unspecified site  without mention of myelopathy   . Hemangioma   . Amyloidosis   . LBBB (left bundle branch block)   . Wide-complex tachycardia ([place]) 01/2011    Felt likely be SVT with abbarancy  . OSA (obstructive sleep apnea)   . Atrial fibrillation ([place])   . Cancer (HCC)     amyloidosis  . Edema 08/29/2013  . Hx of cardiovascular stress test     Adenosine Myoview (02/2014): Apical thinning, no ischemia, EF 48%; low risk.  . Scrotal pain 10/23/2014  . Adenomatous colon polyp 2003    max size 19mm  . Traumatic open wound of left lower leg with delayed healing     Seen at [facility] on 05/29/2015  . 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 Chronic venous insufficiency 2016  . DDD (degenerative disc disease), lumbar     Dr. Katherine Roan  . Cough, persistent 09/29/2015   Past Surgical History  Procedure Laterality Date  . Knee arthroscopy    . Carpal tunnel release  08/2006    Dr Doy Mince at [facility] - Bilateral  . 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 Back surgery  2012    Lumbar, [name] [facility]  . Cardioversion N/A 07/10/2013    Procedure: CARDIOVERSION;  Surgeon: Lelon Perla, MD;  Location: [facility] ENDOSCOPY;  Service: Cardiovascular;  Laterality: N/A;  . Colonoscopy w/ biopsies     Social History:  reports that he has never smoked. He has never used smokeless tobacco. He reports that he drinks alcohol. He reports that he does not use illicit drugs. Where does patient live home.  Can patient participate in ADLs? Yes.  No Known Allergies  Family History:  Family History  Problem Relation Age of Onset  . Lung cancer Mother   . Skin cancer Father     Melanoma  . Colon cancer Neg Hx   . Colon polyps Neg Hx   . Kidney disease Neg Hx   . Esophageal cancer Neg Hx   . Diabetes Neg Hx   . Gallbladder disease Neg Hx       Prior to Admission medications   Medication Sig Start Date End Date Taking? Authorizing Provider  amiodarone  (PACERONE) 200 MG tablet Take 1 tablet (200 mg total) by mouth daily. Patient taking differently: Take 200 mg by mouth 2 (two) times daily.  02/04/16  Yes Lelon Perla, MD  furosemide (LASIX) 40 MG tablet Take 1 tablet (40 mg total) by mouth 2 (two) times daily. 02/04/16  Yes Lelon Perla, MD  pravastatin (PRAVACHOL) 40 MG tablet Take 1 tablet (40 mg total) by mouth every evening. Patient taking differently: Take 40 mg by mouth every morning.  03/21/15  Yes Lelon Perla, MD  TraMADol HCl 50 MG TBDP Take 50 mg by mouth daily as needed (for pain).  02/04/16  Yes Lelon Perla, MD  Turmeric 500 MG CAPS Take 1,000 mg by mouth daily.   Yes Historical Provider, MD  XARELTO 20 MG TABS tablet TAKE ONE TABLET BY MOUTH ONCE DAILY WITH  SUPPER Patient taking differently: TAKE ONE TABLET BY MOUTH ONCE DAILY IN MORNING 09/24/15  Yes Lelon Perla, MD  acyclovir (ZOVIRAX) 400 MG tablet Take 1 tablet (400 mg total) by mouth daily. 03/27/15   Heath Lark, MD  oxycodone-acetaminophen (LYNOX) 5-300 MG tablet Take 1 tablet by mouth every 8 (eight) hours as needed for pain. 12/11/15   Colon Branch, MD    Physical Exam: Filed Vitals:   02/05/16 0200 02/05/16 0230 02/05/16 0300 02/05/16 0330  BP: 114/77 109/74 100/79 110/82  Pulse:  113 101 102  Temp:      TempSrc:      Resp: 20 24 19 19   Height:      Weight:      SpO2:  93% 95% 92%     General:  Moderately built and nourished.  Eyes: Anicteric no pallor.  ENT: No discharge from the ears nose mouth.  Neck: No mass felt. No neck rigidity.  Cardiovascular: S1-S2 heard.  Respiratory: No rhonchi or crepitations.  Abdomen: Soft nontender bowel sounds present. No guarding or rigidity.  Skin: Bruising.  Musculoskeletal: No edema.  Psychiatric: Appears normal.  Neurologic: Alert awake oriented to time place and person. Moves all extremities 5 x 5.  Labs on Admission:  Basic Metabolic Panel:  Recent Labs Lab 02/04/16 0932  02/04/16 2227  NA 142 142  K 4.5 3.6  CL 106 104  CO2 28 23  GLUCOSE 91 104*  BUN 26* 22*  CREATININE 1.90* 1.76*  CALCIUM 8.7 8.5*   Liver Function Tests:  Recent Labs Lab 02/04/16 0932  AST 17  ALT 15  ALKPHOS 86  BILITOT 1.2  PROT 6.0*  ALBUMIN 3.4*   No results for input(s): LIPASE, AMYLASE in the last 168 hours. No results for input(s): AMMONIA in the last 168 hours. CBC:  Recent Labs Lab 02/04/16 0932 02/04/16 2227  WBC 6.5 7.0  HGB 11.4* 11.0*  HCT 36.7* 35.2*  MCV 90.0 91.9  PLT 292 266   Cardiac Enzymes: No results for input(s):  CKTOTAL, CKMB, CKMBINDEX, TROPONINI in the last 168 hours.  BNP (last 3 results) No results for input(s): BNP in the last 8760 hours.  ProBNP (last 3 results) No results for input(s): PROBNP in the last 8760 hours.  CBG:  Recent Labs Lab 02/04/16 2225  GLUCAP 97    Radiological Exams on Admission: Ct Head Wo Contrast  02/04/2016  CLINICAL DATA:  Acute onset of syncope. Bradycardia, followed by tachycardia. Initial encounter. EXAM: CT HEAD WITHOUT CONTRAST TECHNIQUE: Contiguous axial images were obtained from the base of the skull through the vertex without intravenous contrast. COMPARISON:  None. FINDINGS: There is no evidence of acute infarction, mass lesion, or intra- or extra-axial hemorrhage on CT. Prominence of the ventricles and sulci reflects mild cortical volume loss. Diffuse periventricular and subcortical white matter change likely reflects small vessel ischemic microangiopathy. Mild cerebellar atrophy is noted. Mild chronic ischemic change extends into the basal ganglia bilaterally. The brainstem and fourth ventricle are within normal limits. The cerebral hemispheres demonstrate grossly normal gray-white differentiation. No mass effect or midline shift is seen. There is no evidence of fracture; visualized osseous structures are unremarkable in appearance. The orbits are within normal limits. The paranasal sinuses and  mastoid air cells are well-aerated. Mild soft tissue swelling is noted overlying the left frontal calvarium. IMPRESSION: 1. No acute intracranial pathology seen on CT. 2. Mild cortical volume loss and diffuse small vessel ischemic microangiopathy. 3. Mild chronic ischemic change at the basal ganglia bilaterally. 4. Mild soft swelling overlying the left frontal calvarium. Electronically Signed   By: Garald Balding M.D.   On: 02/04/2016 23:53     Assessment/Plan Principal Problem:   Syncope and collapse Active Problems:   Amyloidosis (HCC)   Chronic diastolic heart failure (HCC)   Normocytic normochromic anemia   Persistent atrial fibrillation (HCC)   Syncope   Renal failure (ARF), acute on chronic ([place])   1. Syncope - probably cardiogenic. Monitor shows patient is in A. fib with rate at times increasing to more than 140 but presently rate is around 105 bpm. Closely monitor in telemetry for any arrhythmias. Since patient also had recent increase in Lasix yesterday we will check orthostatics. Hold off Lasix since patient's creatinine has increased from baseline and follow metabolic panel and the specialist address and we introduce slowly. Cardiology is planning to have cardioversion. 2. Acute on chronic renal failure stage III - holding off Lasix for now until we check metabolic panel again and trend creatinine and closely monitor respiratory status and check orthostatics. 3. Chronic diastolic CHF last EF measured in 2015 was 55-60% with grade 2 diastolic dysfunction - was realigned to Lasix seen #1 and 2. 4. Normocytic normochromic anemia - follow CBC. 5. Chronic atrial fibrillation - chads 2 vasc score is more than 2 patient is on xarelto and with patient's renal failure dose decreased as recommended by cardiologist to 15 mg. 6. History of amyloidosis status post bone marrow transplant - being followed by oncologist.   DVT Prophylaxist xarelto.  Code Status: Full code.  Family Communication:  Patient's wife at the bedside.  Disposition Plan: Admit to inpatient.    KAKRAKANDY,ARSHAD N. [facility] Pager [phone].  If 7PM-7AM, please contact night-coverage [email or web address] Password [facility] 02/05/2016, 4:48 AM

## 2016-02-05 NOTE — ED Notes (Signed)
Pt assisted to chair, NAD at this time.  A x 4.

## 2016-02-06 ENCOUNTER — Encounter (HOSPITAL_COMMUNITY)
Admission: EM | Disposition: A | Discharge status: Home / Self Care | Payer: Self-pay | Source: Home / Self Care | Attending: Internal Medicine

## 2016-02-06 ENCOUNTER — Inpatient Hospital Stay (HOSPITAL_COMMUNITY): Payer: Medicare Other | Admitting: Certified Registered Nurse Anesthetist

## 2016-02-06 ENCOUNTER — Inpatient Hospital Stay (HOSPITAL_COMMUNITY): Payer: Medicare Other

## 2016-02-06 ENCOUNTER — Encounter (HOSPITAL_COMMUNITY): Payer: Self-pay | Admitting: Cardiovascular Disease

## 2016-02-06 ENCOUNTER — Ambulatory Visit (HOSPITAL_COMMUNITY): Admission: RE | Admit: 2016-02-06 | Payer: Medicare Other | Source: Ambulatory Visit | Admitting: Cardiovascular Disease

## 2016-02-06 DIAGNOSIS — E859 Amyloidosis, unspecified: Secondary | ICD-10-CM

## 2016-02-06 DIAGNOSIS — F1020 Alcohol dependence, uncomplicated: Secondary | ICD-10-CM | POA: Insufficient documentation

## 2016-02-06 DIAGNOSIS — E858 Other amyloidosis: Secondary | ICD-10-CM

## 2016-02-06 DIAGNOSIS — R55 Syncope and collapse: Secondary | ICD-10-CM

## 2016-02-06 DIAGNOSIS — N189 Chronic kidney disease, unspecified: Secondary | ICD-10-CM

## 2016-02-06 DIAGNOSIS — D649 Anemia, unspecified: Secondary | ICD-10-CM

## 2016-02-06 DIAGNOSIS — N179 Acute kidney failure, unspecified: Secondary | ICD-10-CM

## 2016-02-06 DIAGNOSIS — I5033 Acute on chronic diastolic (congestive) heart failure: Secondary | ICD-10-CM | POA: Insufficient documentation

## 2016-02-06 DIAGNOSIS — I480 Paroxysmal atrial fibrillation: Secondary | ICD-10-CM

## 2016-02-06 HISTORY — PX: CARDIOVERSION: SHX1299

## 2016-02-06 LAB — CBC
HEMATOCRIT: 36.6 % — AB (ref 39.0–52.0)
HEMOGLOBIN: 11.4 g/dL — AB (ref 13.0–17.0)
MCH: 28.6 pg (ref 26.0–34.0)
MCHC: 31.1 g/dL (ref 30.0–36.0)
MCV: 92 fL (ref 78.0–100.0)
Platelets: 249 10*3/uL (ref 150–400)
RBC: 3.98 MIL/uL — AB (ref 4.22–5.81)
RDW: 26.4 % — ABNORMAL HIGH (ref 11.5–15.5)
WBC: 7.1 10*3/uL (ref 4.0–10.5)

## 2016-02-06 LAB — COMPREHENSIVE METABOLIC PANEL
ALBUMIN: 3.2 g/dL — AB (ref 3.5–5.0)
ALT: 19 U/L (ref 17–63)
AST: 20 U/L (ref 15–41)
Alkaline Phosphatase: 68 U/L (ref 38–126)
Anion gap: 14 (ref 5–15)
BILIRUBIN TOTAL: 1.3 mg/dL — AB (ref 0.3–1.2)
BUN: 23 mg/dL — AB (ref 6–20)
CHLORIDE: 101 mmol/L (ref 101–111)
CO2: 26 mmol/L (ref 22–32)
CREATININE: 1.81 mg/dL — AB (ref 0.61–1.24)
Calcium: 8.9 mg/dL (ref 8.9–10.3)
GFR calc Af Amer: 40 mL/min — ABNORMAL LOW (ref >60)
GFR, EST NON AFRICAN AMERICAN: 35 mL/min — AB (ref >60)
GLUCOSE: 107 mg/dL — AB (ref 65–99)
Potassium: 4 mmol/L (ref 3.5–5.1)
Sodium: 141 mmol/L (ref 135–145)
TOTAL PROTEIN: 6 g/dL — AB (ref 6.5–8.1)

## 2016-02-06 SURGERY — CARDIOVERSION
Anesthesia: Monitor Anesthesia Care

## 2016-02-06 MED ORDER — PROPOFOL 10 MG/ML IV BOLUS
INTRAVENOUS | Status: DC | PRN
  Administered 2016-02-06: 70 mg via INTRAVENOUS

## 2016-02-06 MED ORDER — AMIODARONE HCL 200 MG PO TABS
400.0000 mg | ORAL_TABLET | Freq: Two times a day (BID) | ORAL | Status: DC
  Administered 2016-02-06 – 2016-02-08 (×4): 400 mg via ORAL
  Filled 2016-02-06 (×4): qty 2

## 2016-02-06 MED ORDER — FUROSEMIDE 10 MG/ML IJ SOLN
40.0000 mg | Freq: Two times a day (BID) | INTRAMUSCULAR | Status: DC
  Administered 2016-02-06 – 2016-02-07 (×2): 40 mg via INTRAVENOUS
  Filled 2016-02-06 (×2): qty 4

## 2016-02-06 MED ORDER — LIDOCAINE HCL (CARDIAC) 20 MG/ML IV SOLN
INTRAVENOUS | Status: DC | PRN
  Administered 2016-02-06: 40 mg via INTRAVENOUS

## 2016-02-06 MED ORDER — SODIUM CHLORIDE 0.9 % IV SOLN
INTRAVENOUS | Status: DC | PRN
  Administered 2016-02-06: 12:00:00 via INTRAVENOUS

## 2016-02-06 MED ORDER — RIVAROXABAN 20 MG PO TABS
20.0000 mg | ORAL_TABLET | Freq: Every day | ORAL | Status: DC
  Administered 2016-02-07 – 2016-02-08 (×2): 20 mg via ORAL
  Filled 2016-02-06 (×2): qty 1

## 2016-02-06 NOTE — Anesthesia Preprocedure Evaluation (Signed)
Anesthesia Evaluation  Patient identified by MRN, date of birth, ID band Patient awake    Reviewed: Allergy & Precautions, NPO status , Patient's Chart, lab work & pertinent test results  Airway Mallampati: III  TM Distance: >3 FB Neck ROM: Full    Dental  (+) Teeth Intact   Pulmonary sleep apnea ,    breath sounds clear to auscultation       Cardiovascular hypertension, + CAD, + Peripheral Vascular Disease and +CHF  + dysrhythmias Atrial Fibrillation  Rhythm:Irregular Rate:Abnormal     Neuro/Psych negative neurological ROS  negative psych ROS   GI/Hepatic negative GI ROS, Neg liver ROS,   Endo/Other  negative endocrine ROS  Renal/GU Renal disease  negative genitourinary   Musculoskeletal  (+) Arthritis ,   Abdominal   Peds negative pediatric ROS (+)  Hematology negative hematology ROS (+)   Anesthesia Other Findings   Reproductive/Obstetrics negative OB ROS                             Anesthesia Physical Anesthesia Plan  ASA: III  Anesthesia Plan: MAC   Post-op Pain Management:    Induction: Intravenous  Airway Management Planned: Natural Airway  Additional Equipment:   Intra-op Plan:   Post-operative Plan:   Informed Consent: I have reviewed the patients History and Physical, chart, labs and discussed the procedure including the risks, benefits and alternatives for the proposed anesthesia with the patient or authorized representative who has indicated his/her understanding and acceptance.     Plan Discussed with: CRNA  Anesthesia Plan Comments:         Anesthesia Quick Evaluation

## 2016-02-06 NOTE — Interval H&P Note (Signed)
History and Physical Interval Note:  02/06/2016 6:10 AM  Johnny Mitchell  has presented today for surgery, with the diagnosis of A FIB  The various methods of treatment have been discussed with the patient and family. After consideration of risks, benefits and other options for treatment, the patient has consented to  Procedure(s): CARDIOVERSION (N/A) as a surgical intervention .  The patient's history has been reviewed, patient examined, no change in status, stable for surgery.  I have reviewed the patient's chart and labs.  Questions were answered to the patient's satisfaction.     Sharol Harness , MD

## 2016-02-06 NOTE — Progress Notes (Addendum)
PROGRESS NOTE  Johnny Mitchell [id number] DOB: [birth year] DOA: 02/04/2016 PCP: [name], MD  HPI/Recap of past 24 hours:  S/p cardioversion, back to NSR, feeling better, still persistent lower extremity edema, wife in room  Assessment/Plan: Principal Problem:   Syncope and collapse Active Problems:   Amyloidosis (HCC)   Chronic diastolic heart failure (HCC)   Normocytic normochromic anemia   Persistent atrial fibrillation (HCC)   Syncope   Renal failure (ARF), acute on chronic ([place])   Syncope/collpase: unclear etiology, will need ambulatory cardiac monitoring  Afib/rvr: off amiodarone drip, s/p cardioversion today on 2/24, now NSR, cardiology to dose oral amiodarone. Continue xarelto.  Acute on chronic diastolic chf: with significant lower extremity edema, iv lasix, strict i/o's. Cardiology input appreciated.  Acute kidney injury on ckd III:  Cr 1.2 in 09/2015, 1.9 on admission, today 1.8, ua on infection. Good urine output. Close monitor cr, renal dosing meds.  H/o amyloidosis; s/p autologus stem cell transplant in 2011, off meds since 2015, defer to outpatient hematology   Alcohol use: drink nightly, on ciwa protocol, has not needing any ativan since admission.   DVT prophylaxis; on xarelto  Code Status: full  Family Communication: patient and wife  Disposition Plan: home in 1-2 days   Consultants:  cariology  Procedures:  Cardioversion, 2/24  Antibiotics: none  Objective: BP 93/66 mmHg  Pulse 70  Temp(Src) 98.5 F (36.9 C) (Oral)  Resp 18  Ht 5\' 11"  (1.803 m)  Wt 118.389 kg (261 lb)  BMI 36.42 kg/m2  SpO2 96%  Intake/Output Summary (Last 24 hours) at 02/06/16 1830 Last data filed at 02/06/16 1749  Gross per 24 hour  Intake    800 ml  Output   1451 ml  Net   -651 ml   Filed Weights   02/05/16 1514 02/06/16 0529 02/06/16 1107  Weight: 118.48 kg (261 lb 3.2 oz) 118.389 kg (261 lb) 118.389 kg (261 lb)    Exam:   General:   NAD  Cardiovascular: RRR  Respiratory: CTABL  Abdomen: Soft/ND/NT, positive BS  Musculoskeletal: bilateral lower extremity 3+ pitting  Edema  Neuro: aaox3  Data Reviewed: Basic Metabolic Panel:  Recent Labs Lab 02/04/16 0932 02/04/16 2227 02/05/16 0609 02/06/16 0320  NA 142 142 141 141  K 4.5 3.6 3.9 4.0  CL 106 104 105 101  CO2 28 23 27 26   GLUCOSE 91 104* 107* 107*  BUN 26* 22* 21* 23*  CREATININE 1.90* 1.76* 1.73* 1.81*  CALCIUM 8.7 8.5* 8.2* 8.9   Liver Function Tests:  Recent Labs Lab 02/04/16 0932 02/05/16 0609 02/06/16 0320  AST 17 19 20   ALT 15 19 19   ALKPHOS 86 68 68  BILITOT 1.2 1.6* 1.3*  PROT 6.0* 5.7* 6.0*  ALBUMIN 3.4* 3.1* 3.2*   No results for input(s): LIPASE, AMYLASE in the last 168 hours. No results for input(s): AMMONIA in the last 168 hours. CBC:  Recent Labs Lab 02/04/16 0932 02/04/16 2227 02/05/16 0609 02/06/16 0320  WBC 6.5 7.0 8.0 7.1  NEUTROABS  --   --  5.2  --   HGB 11.4* 11.0* 10.4* 11.4*  HCT 36.7* 35.2* 33.5* 36.6*  MCV 90.0 91.9 90.3 92.0  PLT 292 266 239 249   Cardiac Enzymes:    Recent Labs Lab 02/05/16 0609 02/05/16 1335 02/05/16 1654  TROPONINI 0.03 0.03 0.03   BNP (last 3 results) No results for input(s): BNP in the last 8760 hours.  ProBNP (last 3 results) No results for  input(s): PROBNP in the last 8760 hours.  CBG:  Recent Labs Lab 02/04/16 2225  GLUCAP 97    No results found for this or any previous visit (from the past 240 hour(s)).   Studies: No results found.  Scheduled Meds: . amiodarone  400 mg Oral BID  . folic acid  1 mg Oral Daily  . furosemide  40 mg Intravenous BID  . LORazepam  0-4 mg Oral Q6H   Followed by  . [START ON 02/07/2016] LORazepam  0-4 mg Oral Q12H  . multivitamin with minerals  1 tablet Oral Daily  . pravastatin  40 mg Oral q morning - 10a  . [START ON 02/07/2016] rivaroxaban  20 mg Oral Q breakfast  . thiamine  100 mg Oral Daily    Continuous Infusions:     Time spent: 11mins  Brek Reece MD, PhD  [facility] Pager [phone]. If 7PM-7AM, please contact night-coverage at [email or web address], password [facility] 02/06/2016, 6:30 PM  LOS: 1 day

## 2016-02-06 NOTE — Evaluation (Signed)
Physical Therapy Evaluation Patient Details Name: Johnny Mitchell MRN: [id number] DOB: 1941-04-19 Today's Date: 02/06/2016   History of Present Illness  75 yo male with a known history of afib, diastolic CHF, amyloidosis, hyperlipidemia and nonobstructive CAD He was brought by EMS with an episode of syncope in the setting of AF RVR  Clinical Impression  Patient demonstrates deficits in functional mobility as indicated below. Will need continued skilled PT to address deficits and maximize function. Will see as indicated and progress as tolerated. Ambulated on room air with HR 150s, and desaturation to low 80s.     Follow Up Recommendations No PT follow up;Supervision for mobility/OOB    Equipment Recommendations  None recommended by PT    Recommendations for Other Services       Precautions / Restrictions Precautions Precautions: Fall      Mobility  Bed Mobility               General bed mobility comments: received in chair  Transfers Overall transfer level: Needs assistance   Transfers: Sit to/from Stand Sit to Stand: Min assist         General transfer comment: min assist secodnary to low surface of chair (patient very tall)  (UE assist to pull to upright. VCs for positioning at edge )  Ambulation/Gait Ambulation/Gait assistance: Supervision Ambulation Distance (Feet): 100 Feet Assistive device: None Gait Pattern/deviations: Step-through pattern;Drifts right/left     General Gait Details: some instability noted with fatigue, HR elevated 150s during minimal activity (desaturation to 83% on room air with activity)  Stairs            Wheelchair Mobility    Modified Rankin (Stroke Patients Only)       Balance Overall balance assessment: History of Falls                                           Pertinent Vitals/Pain Pain Assessment: 0-10 Pain Score: 5  Pain Location: ribs and sore all over from fall Pain Descriptors /  Indicators: Sore Pain Intervention(s): Monitored during session    Home Living Family/patient expects to be discharged to:: Private residence Living Arrangements: Spouse/significant other   Type of Home: House Home Access: Level entry     Home Layout: Two level;Able to live on main level with bedroom/bathroom Home Equipment: None      Prior Function Level of Independence: Independent               Hand Dominance   Dominant Hand: Right    Extremity/Trunk Assessment   Upper Extremity Assessment: Overall WFL for tasks assessed           Lower Extremity Assessment: Overall WFL for tasks assessed         Communication   Communication: No difficulties  Cognition Arousal/Alertness: Awake/alert Behavior During Therapy: WFL for tasks assessed/performed Overall Cognitive Status: Within Functional Limits for tasks assessed                      General Comments      Exercises        Assessment/Plan    PT Assessment Patient needs continued PT services  PT Diagnosis Difficulty walking;Abnormality of gait;Acute pain   PT Problem List Decreased activity tolerance;Decreased mobility  PT Treatment Interventions DME instruction;Gait training;Stair training;Functional mobility training;Therapeutic activities;Therapeutic exercise;Balance training;Patient/family education   PT  Goals (Current goals can be found in the Care Plan section) Acute Rehab PT Goals Patient Stated Goal: to breathe better PT Goal Formulation: With patient Time For Goal Achievement: 02/20/16 Potential to Achieve Goals: Good    Frequency Min 3X/week   Barriers to discharge        Co-evaluation               End of Session Equipment Utilized During Treatment: Gait belt Activity Tolerance: Patient limited by fatigue Patient left:  (with transport) Nurse Communication: Mobility status         Time: 1033-1050 PT Time Calculation (min) (ACUTE ONLY): 17 min   Charges:    PT Evaluation $PT Eval Moderate Complexity: 1 Procedure     PT G CodesDuncan Dull 02/07/2016, 12:32 PM Alben Deeds, [place] DPT  [phone]

## 2016-02-06 NOTE — Progress Notes (Signed)
Echocardiogram 2D Echocardiogram has been performed.  02/06/2016 9:36 AM Maudry Mayhew, RVT, RDCS, RDMS

## 2016-02-06 NOTE — H&P (View-Only) (Signed)
Patient Name: Johnny Mitchell Date of Encounter: 02/05/2016   Pt. Profile: The patient is a 75 year old man with a known history of atrial fibrillation, diastolic CHF, amyloidosis, hyperlipidemia and nonobstructive coronary artery disease who was seen by Dr. [name] 02/04/16. He was scheduled for DCCV 2/24 due to extremely symptomatic afib and increase lasix to 40mg  BID due to dyspnea. However brought by EMS overnight  with an episode of syncope in the setting of AF RVR.    SUBJECTIVE  Currently denies chest pain or sob.   CURRENT MEDS . amiodarone  200 mg Oral BID  . folic acid  1 mg Oral Daily  . LORazepam  0-4 mg Oral Q6H   Followed by  . [START ON 02/07/2016] LORazepam  0-4 mg Oral Q12H  . multivitamin with minerals  1 tablet Oral Daily  . pravastatin  40 mg Oral q morning - 10a  . rivaroxaban  15 mg Oral Q breakfast  . thiamine  100 mg Oral Daily   Or  . thiamine  100 mg Intravenous Daily    OBJECTIVE  Filed Vitals:   02/05/16 0930 02/05/16 1000 02/05/16 1015 02/05/16 1200  BP: 109/90 114/87  131/70  Pulse: 99 105 101 95  Temp:      TempSrc:      Resp: 21 18 18 11   Height:      Weight:      SpO2: 98% 100% 98% 94%   No intake or output data in the 24 hours ending 02/05/16 1235 Filed Weights   02/04/16 2136  Weight: 280 lb (127.007 kg)    PHYSICAL EXAM  General: Pleasant, NAD. Neuro: Alert and oriented X 3. Moves all extremities spontaneously. Psych: Normal affect. HEENT:  Normal  Neck: Supple without bruits. + JVD. Lungs:  Resp regular and unlabored, CTA. Heart: Ir IR with tachycardia no s3, s4, or murmurs. Abdomen: Soft, non-tender, non-distended, BS + x 4.  Extremities: No clubbing, cyanosis. 2+ BL LE edema. DP 1 + and equal bilaterally.  Accessory Clinical Findings  CBC  Recent Labs  02/04/16 2227 02/05/16 0609  WBC 7.0 8.0  NEUTROABS  --  5.2  HGB 11.0* 10.4*  HCT 35.2* 33.5*  MCV 91.9 90.3  PLT 266 [id number]   Basic Metabolic  Panel  Recent Labs  02/04/16 2227 02/05/16 0609  NA 142 141  K 3.6 3.9  CL 104 105  CO2 23 27  GLUCOSE 104* 107*  BUN 22* 21*  CREATININE 1.76* 1.73*  CALCIUM 8.5* 8.2*   Liver Function Tests  Recent Labs  02/04/16 0932 02/05/16 0609  AST 17 19  ALT 15 19  ALKPHOS 86 68  BILITOT 1.2 1.6*  PROT 6.0* 5.7*  ALBUMIN 3.4* 3.1*   No results for input(s): LIPASE, AMYLASE in the last 72 hours. Cardiac Enzymes  Recent Labs  02/05/16 0609  TROPONINI 0.03   Thyroid Function Tests  Recent Labs  02/05/16 0609  TSH 1.611    TELE  afib at rate of 100-120s  Radiology/Studies  Ct Head Wo Contrast  02/04/2016  CLINICAL DATA:  Acute onset of syncope. Bradycardia, followed by tachycardia. Initial encounter. EXAM: CT HEAD WITHOUT CONTRAST TECHNIQUE: Contiguous axial images were obtained from the base of the skull through the vertex without intravenous contrast. COMPARISON:  None. FINDINGS: There is no evidence of acute infarction, mass lesion, or intra- or extra-axial hemorrhage on CT. Prominence of the ventricles and sulci reflects mild cortical volume loss. Diffuse periventricular and subcortical white matter  change likely reflects small vessel ischemic microangiopathy. Mild cerebellar atrophy is noted. Mild chronic ischemic change extends into the basal ganglia bilaterally. The brainstem and fourth ventricle are within normal limits. The cerebral hemispheres demonstrate grossly normal gray-white differentiation. No mass effect or midline shift is seen. There is no evidence of fracture; visualized osseous structures are unremarkable in appearance. The orbits are within normal limits. The paranasal sinuses and mastoid air cells are well-aerated. Mild soft tissue swelling is noted overlying the left frontal calvarium. IMPRESSION: 1. No acute intracranial pathology seen on CT. 2. Mild cortical volume loss and diffuse small vessel ischemic microangiopathy. 3. Mild chronic ischemic change  at the basal ganglia bilaterally. 4. Mild soft swelling overlying the left frontal calvarium. Electronically Signed   By: Garald Balding M.D.   On: 02/04/2016 23:53    ASSESSMENT AND PLAN  The patient is a 75 year old man with a known history of atrial fibrillation, diastolic CHF, amyloidosis, hyperlipidemia and nonobstructive coronary artery disease who was seen by Dr. [name] 02/04/16. He was scheduled for DCCV 2/24 due to extremely symptomatic afib and increased  amiodarone to 400 mg twice a day for 2 weeks and increase lasix to 40mg  BID due to dyspnea. However brought by EMS overnight  with an episode of syncope in the setting of AF RVR.   1. Afib with RVR - Plan was to start Amiodarone 400mg  BID. He took 400mg  yesterday. However given 200mg  today, will give another 200mg  now and change to 400mg  BID for 2 weeks. His rate in 100-120s. BP was soft low during admission, now normal. Might need IV amiodarone for better rate controlled or another rate controlled medication. DCCV tomorrow as schedule. Continue Xarelto 15mg  for anticoagulation. MD to see.   2. Acute on chronic diastolic CHF - Increased lasix to 40mg  BID yesterday. However hold by IM due to syncope and hypotension upon presentation.  BP stable. He is volume overloaded on exam. Consider resuming IV lasix 40mg . Follow Scr closely.   3. Syncopy - The etiology of his syncope is unclear but concerning for a cardiac etiology. Differential include hypotension. Tele observation.   4. AonCKD, stage III - Follow SCr closely  Signed, Linnell Swords PA-C Pager [phone]  .

## 2016-02-06 NOTE — Anesthesia Postprocedure Evaluation (Signed)
Anesthesia Post Note  Patient: Johnny Mitchell  Procedure(s) Performed: Procedure(s) (LRB): CARDIOVERSION (N/A)  Patient location during evaluation: PACU Anesthesia Type: MAC Level of consciousness: awake and alert Pain management: pain level controlled Vital Signs Assessment: post-procedure vital signs reviewed and stable Respiratory status: spontaneous breathing, nonlabored ventilation, respiratory function stable and patient connected to nasal cannula oxygen Cardiovascular status: stable and blood pressure returned to baseline Anesthetic complications: no    Last Vitals:  Filed Vitals:   02/06/16 1215 02/06/16 1250  BP: 104/51 93/66  Pulse: 70 70  Temp:    Resp: 19 18    Last Pain:  Filed Vitals:   02/06/16 1251  PainSc: 0-No pain                 Effie Berkshire

## 2016-02-06 NOTE — CV Procedure (Signed)
Electrical Cardioversion Procedure Note Johnny Mitchell [id number] [birth year]  Procedure: Electrical Cardioversion Indications:  Atrial Fibrillation  Procedure Details Consent: Risks of procedure as well as the alternatives and risks of each were explained to the (patient/caregiver).  Consent for procedure obtained. Time Out: Verified patient identification, verified procedure, site/side was marked, verified correct patient position, special equipment/implants available, medications/allergies/relevent history reviewed, required imaging and test results available.  Performed  Patient placed on cardiac monitor, pulse oximetry, supplemental oxygen as necessary.  Sedation given: Propofol and lidocaine Pacer pads placed anterior and posterior chest.  Cardioverted 2 time(s).  Cardioverted at 150J unsuccessful, 200J successful.  Evaluation Findings: Post procedure EKG shows: sinus rhythm with PVCs Complications: None Patient did tolerate procedure well.   Sharol Harness, MD 02/06/2016, 11:50 AM

## 2016-02-06 NOTE — Transfer of Care (Signed)
Immediate Anesthesia Transfer of Care Note  Patient: Johnny Mitchell  Procedure(s) Performed: Procedure(s): CARDIOVERSION (N/A)  Patient Location: PACU and Endoscopy Unit  Anesthesia Type:MAC  Level of Consciousness: awake, alert , oriented and patient cooperative  Airway & Oxygen Therapy: Patient Spontanous Breathing and Patient connected to nasal cannula oxygen  Post-op Assessment: Report given to RN and Post -op Vital signs reviewed and stable  Post vital signs: Reviewed and stable  Last Vitals:  Filed Vitals:   02/06/16 0800 02/06/16 1107  BP: 110/74 119/87  Pulse: 90 112  Temp: 37.3 C 36.9 C  Resp:  21    Complications: No apparent anesthesia complications

## 2016-02-06 NOTE — Progress Notes (Signed)
DAILY PROGRESS NOTE  Subjective:  Feels better today. Cardioverted to NSR. Echo shows normal systolic function - ?grade 3 diastolic dysfunction. Still appears volume overloaded. History of amyloidosis with bone marrow transplant. Drinks 2-3 etoh beverages/night.  Objective:  Temp:  [97.8 F (36.6 C)-99.1 F (37.3 C)] 98.5 F (36.9 C) (02/24 1107) Pulse Rate:  [70-122] 70 (02/24 1250) Resp:  [18-21] 18 (02/24 1250) BP: (93-119)/(51-87) 93/66 mmHg (02/24 1250) SpO2:  [95 %-100 %] 96 % (02/24 1250) Weight:  [261 lb (118.389 kg)] 261 lb (118.389 kg) (02/24 1107) Weight change: -18 lb 12.8 oz (-8.528 kg)  Intake/Output from previous day: 02/23 0701 - 02/24 0700 In: 120 [P.O.:120] Out: 300 [Urine:300]  Intake/Output from this shift: Total I/O In: 440 [P.O.:240; I.V.:200] Out: 1450 [Urine:1450]  Medications: Current Facility-Administered Medications  Medication Dose Route Frequency Provider Last Rate Last Dose  . acetaminophen (TYLENOL) tablet 650 mg  650 mg Oral Q6H PRN Rise Patience, MD       Or  . acetaminophen (TYLENOL) suppository 650 mg  650 mg Rectal Q6H PRN Rise Patience, MD      . Derrill Memo ON 02/07/2016] amiodarone (PACERONE) tablet 400 mg  400 mg Oral BID Pixie Casino, MD      . folic acid (FOLVITE) tablet 1 mg  1 mg Oral Daily Rise Patience, MD   1 mg at 02/06/16 1020  . furosemide (LASIX) injection 40 mg  40 mg Intravenous Daily Pixie Casino, MD   40 mg at 02/06/16 1020  . LORazepam (ATIVAN) tablet 1 mg  1 mg Oral Q6H PRN Rise Patience, MD   1 mg at 02/05/16 1655   Or  . LORazepam (ATIVAN) injection 1 mg  1 mg Intravenous Q6H PRN Rise Patience, MD      . LORazepam (ATIVAN) tablet 0-4 mg  0-4 mg Oral Q6H Rise Patience, MD   0 mg at 02/05/16 0726   Followed by  . [START ON 02/07/2016] LORazepam (ATIVAN) tablet 0-4 mg  0-4 mg Oral Q12H Rise Patience, MD      . multivitamin with minerals tablet 1 tablet  1 tablet Oral Daily  Rise Patience, MD   1 tablet at 02/06/16 1019  . ondansetron (ZOFRAN) tablet 4 mg  4 mg Oral Q6H PRN Rise Patience, MD       Or  . ondansetron [facility]) injection 4 mg  4 mg Intravenous Q6H PRN Rise Patience, MD      . pravastatin (PRAVACHOL) tablet 40 mg  40 mg Oral q morning - 10a Rise Patience, MD   40 mg at 02/06/16 1019  . [START ON 02/07/2016] rivaroxaban (XARELTO) tablet 20 mg  20 mg Oral Q breakfast Florencia Reasons, MD      . thiamine (VITAMIN B-1) tablet 100 mg  100 mg Oral Daily Rise Patience, MD   100 mg at 02/06/16 1019  . traMADol (ULTRAM) tablet 50 mg  50 mg Oral Daily PRN Rise Patience, MD        Physical Exam: General appearance: alert and no distress Lungs: diminished breath sounds bibasilar Heart: regular rate and rhythm Extremities: edema 1-2+ edema, diffuse ecchymosis  Lab Results: Results for orders placed or performed during the hospital encounter of 02/04/16 (from the past 48 hour(s))  CBG monitoring, ED     Status: None   Collection Time: 02/04/16 10:25 PM  Result Value Ref Range   Glucose-Capillary 97  65 - 99 mg/dL  Basic metabolic panel     Status: Abnormal   Collection Time: 02/04/16 10:27 PM  Result Value Ref Range   Sodium 142 135 - 145 mmol/L   Potassium 3.6 3.5 - 5.1 mmol/L   Chloride 104 101 - 111 mmol/L   CO2 23 22 - 32 mmol/L   Glucose, Bld 104 (H) 65 - 99 mg/dL   BUN 22 (H) 6 - 20 mg/dL   Creatinine, Ser 1.76 (H) 0.61 - 1.24 mg/dL   Calcium 8.5 (L) 8.9 - 10.3 mg/dL   GFR calc non Af Amer 36 (L) >60 mL/min   GFR calc Af Amer 42 (L) >60 mL/min    Comment: (NOTE) The eGFR has been calculated using the CKD EPI equation. This calculation has not been validated in all clinical situations. eGFR's persistently <60 mL/min signify possible Chronic Kidney Disease.    Anion gap 15 5 - 15  CBC     Status: Abnormal   Collection Time: 02/04/16 10:27 PM  Result Value Ref Range   WBC 7.0 4.0 - 10.5 K/uL    Comment: WHITE COUNT  CONFIRMED ON SMEAR   RBC 3.83 (L) 4.22 - 5.81 MIL/uL   Hemoglobin 11.0 (L) 13.0 - 17.0 g/dL   HCT 35.2 (L) 39.0 - 52.0 %   MCV 91.9 78.0 - 100.0 fL   MCH 28.7 26.0 - 34.0 pg   MCHC 31.3 30.0 - 36.0 g/dL   RDW 25.8 (H) 11.5 - 15.5 %   Platelets 266 150 - 400 K/uL    Comment: PLATELET COUNT CONFIRMED BY SMEAR SPECIMEN CHECKED FOR CLOTS REPEATED TO VERIFY LARGE PLATELETS PRESENT   Urinalysis, Routine w reflex microscopic (not at [facility])     Status: None   Collection Time: 02/05/16 12:47 AM  Result Value Ref Range   Color, Urine YELLOW YELLOW   APPearance CLEAR CLEAR   Specific Gravity, Urine 1.010 1.005 - 1.030   pH 5.0 5.0 - 8.0   Glucose, UA NEGATIVE NEGATIVE mg/dL   Hgb urine dipstick NEGATIVE NEGATIVE   Bilirubin Urine NEGATIVE NEGATIVE   Ketones, ur NEGATIVE NEGATIVE mg/dL   Protein, ur NEGATIVE NEGATIVE mg/dL   Nitrite NEGATIVE NEGATIVE   Leukocytes, UA NEGATIVE NEGATIVE    Comment: MICROSCOPIC NOT DONE ON URINES WITH NEGATIVE PROTEIN, BLOOD, LEUKOCYTES, NITRITE, OR GLUCOSE <1000 mg/dL.  Troponin I (q 6hr x 3)     Status: None   Collection Time: 02/05/16  6:09 AM  Result Value Ref Range   Troponin I 0.03 <0.031 ng/mL    Comment:        NO INDICATION OF MYOCARDIAL INJURY.   TSH     Status: None   Collection Time: 02/05/16  6:09 AM  Result Value Ref Range   TSH 1.611 0.350 - 4.500 uIU/mL  Comprehensive metabolic panel     Status: Abnormal   Collection Time: 02/05/16  6:09 AM  Result Value Ref Range   Sodium 141 135 - 145 mmol/L   Potassium 3.9 3.5 - 5.1 mmol/L   Chloride 105 101 - 111 mmol/L   CO2 27 22 - 32 mmol/L   Glucose, Bld 107 (H) 65 - 99 mg/dL   BUN 21 (H) 6 - 20 mg/dL   Creatinine, Ser 1.73 (H) 0.61 - 1.24 mg/dL   Calcium 8.2 (L) 8.9 - 10.3 mg/dL   Total Protein 5.7 (L) 6.5 - 8.1 g/dL   Albumin 3.1 (L) 3.5 - 5.0 g/dL   AST 19 15 -  41 U/L   ALT 19 17 - 63 U/L   Alkaline Phosphatase 68 38 - 126 U/L   Total Bilirubin 1.6 (H) 0.3 - 1.2 mg/dL   GFR calc  non Af Amer 37 (L) >60 mL/min   GFR calc Af Amer 42 (L) >60 mL/min    Comment: (NOTE) The eGFR has been calculated using the CKD EPI equation. This calculation has not been validated in all clinical situations. eGFR's persistently <60 mL/min signify possible Chronic Kidney Disease.    Anion gap 9 5 - 15  CBC WITH DIFFERENTIAL     Status: Abnormal   Collection Time: 02/05/16  6:09 AM  Result Value Ref Range   WBC 8.0 4.0 - 10.5 K/uL   RBC 3.71 (L) 4.22 - 5.81 MIL/uL   Hemoglobin 10.4 (L) 13.0 - 17.0 g/dL   HCT 33.5 (L) 39.0 - 52.0 %   MCV 90.3 78.0 - 100.0 fL   MCH 28.0 26.0 - 34.0 pg   MCHC 31.0 30.0 - 36.0 g/dL   RDW 25.9 (H) 11.5 - 15.5 %   Platelets 239 150 - 400 K/uL    Comment: SPECIMEN CHECKED FOR CLOTS PLATELET COUNT CONFIRMED BY SMEAR    Neutrophils Relative % 65 %   Lymphocytes Relative 10 %   Monocytes Relative 23 %   Eosinophils Relative 0 %   Basophils Relative 2 %   Neutro Abs 5.2 1.7 - 7.7 K/uL   Lymphs Abs 0.8 0.7 - 4.0 K/uL   Monocytes Absolute 1.8 (H) 0.1 - 1.0 K/uL   Eosinophils Absolute 0.0 0.0 - 0.7 K/uL   Basophils Absolute 0.2 (H) 0.0 - 0.1 K/uL   RBC Morphology ELLIPTOCYTES     Comment: BASOPHILIC STIPPLING   WBC Morphology ACANTHOCYTES   Pathologist smear review     Status: None   Collection Time: 02/05/16  6:09 AM  Result Value Ref Range   Path Review ANISOCYTOSIS.     Comment: Reviewed by Chrystie Nose. Saralyn Pilar, M.D. 02/05/16.   Troponin I (q 6hr x 3)     Status: None   Collection Time: 02/05/16  1:35 PM  Result Value Ref Range   Troponin I 0.03 <0.031 ng/mL    Comment:        NO INDICATION OF MYOCARDIAL INJURY.   Troponin I (q 6hr x 3)     Status: None   Collection Time: 02/05/16  4:54 PM  Result Value Ref Range   Troponin I 0.03 <0.031 ng/mL    Comment:        NO INDICATION OF MYOCARDIAL INJURY.   CBC     Status: Abnormal   Collection Time: 02/06/16  3:20 AM  Result Value Ref Range   WBC 7.1 4.0 - 10.5 K/uL   RBC 3.98 (L) 4.22 -  5.81 MIL/uL   Hemoglobin 11.4 (L) 13.0 - 17.0 g/dL   HCT 36.6 (L) 39.0 - 52.0 %   MCV 92.0 78.0 - 100.0 fL   MCH 28.6 26.0 - 34.0 pg   MCHC 31.1 30.0 - 36.0 g/dL   RDW 26.4 (H) 11.5 - 15.5 %   Platelets 249 150 - 400 K/uL  Comprehensive metabolic panel     Status: Abnormal   Collection Time: 02/06/16  3:20 AM  Result Value Ref Range   Sodium 141 135 - 145 mmol/L   Potassium 4.0 3.5 - 5.1 mmol/L   Chloride 101 101 - 111 mmol/L   CO2 26 22 - 32 mmol/L   Glucose,  Bld 107 (H) 65 - 99 mg/dL   BUN 23 (H) 6 - 20 mg/dL   Creatinine, Ser 1.81 (H) 0.61 - 1.24 mg/dL   Calcium 8.9 8.9 - 10.3 mg/dL   Total Protein 6.0 (L) 6.5 - 8.1 g/dL   Albumin 3.2 (L) 3.5 - 5.0 g/dL   AST 20 15 - 41 U/L   ALT 19 17 - 63 U/L   Alkaline Phosphatase 68 38 - 126 U/L   Total Bilirubin 1.3 (H) 0.3 - 1.2 mg/dL   GFR calc non Af Amer 35 (L) >60 mL/min   GFR calc Af Amer 40 (L) >60 mL/min    Comment: (NOTE) The eGFR has been calculated using the CKD EPI equation. This calculation has not been validated in all clinical situations. eGFR's persistently <60 mL/min signify possible Chronic Kidney Disease.    Anion gap 14 5 - 15    Imaging: Ct Head Wo Contrast  02/04/2016  CLINICAL DATA:  Acute onset of syncope. Bradycardia, followed by tachycardia. Initial encounter. EXAM: CT HEAD WITHOUT CONTRAST TECHNIQUE: Contiguous axial images were obtained from the base of the skull through the vertex without intravenous contrast. COMPARISON:  None. FINDINGS: There is no evidence of acute infarction, mass lesion, or intra- or extra-axial hemorrhage on CT. Prominence of the ventricles and sulci reflects mild cortical volume loss. Diffuse periventricular and subcortical white matter change likely reflects small vessel ischemic microangiopathy. Mild cerebellar atrophy is noted. Mild chronic ischemic change extends into the basal ganglia bilaterally. The brainstem and fourth ventricle are within normal limits. The cerebral  hemispheres demonstrate grossly normal gray-white differentiation. No mass effect or midline shift is seen. There is no evidence of fracture; visualized osseous structures are unremarkable in appearance. The orbits are within normal limits. The paranasal sinuses and mastoid air cells are well-aerated. Mild soft tissue swelling is noted overlying the left frontal calvarium. IMPRESSION: 1. No acute intracranial pathology seen on CT. 2. Mild cortical volume loss and diffuse small vessel ischemic microangiopathy. 3. Mild chronic ischemic change at the basal ganglia bilaterally. 4. Mild soft swelling overlying the left frontal calvarium. Electronically Signed   By: Garald Balding M.D.   On: 02/04/2016 23:53    Assessment:  1. Principal Problem: 2.   Syncope and collapse 3. Active Problems: 4.   Amyloidosis ([place]) 5.   Chronic diastolic heart failure ([place]) 6.   Normocytic normochromic anemia 7.   Persistent atrial fibrillation ([place]) 8.   Syncope 9.   Renal failure (ARF), acute on chronic (HCC) 10.   Plan:  1. Successful DCCV to NSR today. Continue Xarelto. Still in decompensated diastolic CHF - likely a-fib + severe diastolic dysfunction (?related t amyloidosis). Would give additional IV lasix tonight and tomorrow at least and then transition back to 40 mg po BID oral lasix. Troponins negative. Follow creatinine. Will need monitor at discharge secondary to syncope - no clear etiology on telemetry here.  Time Spent Directly with Patient:  15 minutes  Length of Stay:  LOS: 1 day   Pixie Casino, MD, [facility] Attending Cardiologist [place] 02/06/2016, 4:32 PM

## 2016-02-07 DIAGNOSIS — I5033 Acute on chronic diastolic (congestive) heart failure: Secondary | ICD-10-CM

## 2016-02-07 LAB — COMPREHENSIVE METABOLIC PANEL
ALBUMIN: 2.7 g/dL — AB (ref 3.5–5.0)
ALK PHOS: 60 U/L (ref 38–126)
ALT: 16 U/L — ABNORMAL LOW (ref 17–63)
ANION GAP: 9 (ref 5–15)
AST: 17 U/L (ref 15–41)
BILIRUBIN TOTAL: 2 mg/dL — AB (ref 0.3–1.2)
BUN: 23 mg/dL — ABNORMAL HIGH (ref 6–20)
CALCIUM: 8.3 mg/dL — AB (ref 8.9–10.3)
CO2: 27 mmol/L (ref 22–32)
Chloride: 105 mmol/L (ref 101–111)
Creatinine, Ser: 1.61 mg/dL — ABNORMAL HIGH (ref 0.61–1.24)
GFR, EST AFRICAN AMERICAN: 46 mL/min — AB (ref >60)
GFR, EST NON AFRICAN AMERICAN: 40 mL/min — AB (ref >60)
Glucose, Bld: 97 mg/dL (ref 65–99)
POTASSIUM: 3.8 mmol/L (ref 3.5–5.1)
Sodium: 141 mmol/L (ref 135–145)
TOTAL PROTEIN: 5.5 g/dL — AB (ref 6.5–8.1)

## 2016-02-07 LAB — CBC
HEMATOCRIT: 32.7 % — AB (ref 39.0–52.0)
HEMOGLOBIN: 10.1 g/dL — AB (ref 13.0–17.0)
MCH: 28.1 pg (ref 26.0–34.0)
MCHC: 30.9 g/dL (ref 30.0–36.0)
MCV: 90.8 fL (ref 78.0–100.0)
Platelets: 216 10*3/uL (ref 150–400)
RBC: 3.6 MIL/uL — ABNORMAL LOW (ref 4.22–5.81)
RDW: 26 % — AB (ref 11.5–15.5)
WBC: 6.4 10*3/uL (ref 4.0–10.5)

## 2016-02-07 MED ORDER — FUROSEMIDE 40 MG PO TABS
40.0000 mg | ORAL_TABLET | Freq: Every day | ORAL | Status: DC
  Administered 2016-02-07 – 2016-02-08 (×2): 40 mg via ORAL
  Filled 2016-02-07 (×2): qty 1

## 2016-02-07 MED ORDER — FUROSEMIDE 8 MG/ML PO SOLN: 40.0000 mg | Freq: Every day | ORAL | Status: DC

## 2016-02-07 NOTE — Progress Notes (Signed)
PROGRESS NOTE  Johnny Mitchell [id number] DOB: Nov 15, 1941 DOA: 02/04/2016 PCP: [name], MD  HPI/Recap of past 24 hours:  S/p cardioversion on 2/24, back to NSR, less lower extremity edema, overall feeling better, wife in room  Assessment/Plan: Principal Problem:   Syncope and collapse Active Problems:   Amyloidosis (HCC)   Chronic diastolic heart failure (HCC)   Normocytic normochromic anemia   Persistent atrial fibrillation (HCC)   Syncope   Renal failure (ARF), acute on chronic (HCC)   Paroxysmal atrial fibrillation (HCC)   Acute on chronic diastolic CHF (congestive heart failure), NYHA class 1 ([place])   Uncomplicated alcohol dependence ([place])   Syncope/collpase: unclear etiology, will need ambulatory cardiac monitoring  Afib/rvr: off amiodarone drip, s/p cardioversion today on 2/24, now NSR, cardiology to dose oral amiodarone. Continue xarelto.  Acute on chronic diastolic chf: with significant lower extremity edema, iv lasix, strict i/o's. Cardiology input appreciated.  Acute kidney injury on ckd III:  Cr 1.2 in 09/2015, 1.9 on admission, today 1.8, ua on infection. Good urine output. Close monitor cr, renal dosing meds.  H/o amyloidosis; s/p autologus stem cell transplant in 2011, off meds since 2015, defer to outpatient hematology   Alcohol use: drink nightly, on ciwa protocol, has not needing any ativan since admission.   DVT prophylaxis; on xarelto  Code Status: full  Family Communication: patient and wife  Disposition Plan: home likely 2/26   Consultants:  cardiology  Procedures:  Cardioversion, 2/24  Antibiotics: none  Objective: BP 102/53 mmHg  Pulse 80  Temp(Src) 98.2 F (36.8 C) (Oral)  Resp 18  Ht 5\' 11"  (1.803 m)  Wt 125.283 kg (276 lb 3.2 oz)  BMI 38.54 kg/m2  SpO2 96%  Intake/Output Summary (Last 24 hours) at 02/07/16 1204 Last data filed at 02/07/16 0900  Gross per 24 hour  Intake   1200 ml  Output   1001 ml  Net    199 ml    Filed Weights   02/06/16 0529 02/06/16 1107 02/07/16 0546  Weight: 118.389 kg (261 lb) 118.389 kg (261 lb) 125.283 kg (276 lb 3.2 oz)    Exam:   General:  NAD  Cardiovascular: RRR  Respiratory: CTABL  Abdomen: Soft/ND/NT, positive BS  Musculoskeletal: bilateral lower extremity  pitting  Edema improving  Neuro: aaox3  Data Reviewed: Basic Metabolic Panel:  Recent Labs Lab 02/04/16 0932 02/04/16 2227 02/05/16 0609 02/06/16 0320 02/07/16 0739  NA 142 142 141 141 141  K 4.5 3.6 3.9 4.0 3.8  CL 106 104 105 101 105  CO2 28 23 27 26 27   GLUCOSE 91 104* 107* 107* 97  BUN 26* 22* 21* 23* 23*  CREATININE 1.90* 1.76* 1.73* 1.81* 1.61*  CALCIUM 8.7 8.5* 8.2* 8.9 8.3*   Liver Function Tests:  Recent Labs Lab 02/04/16 0932 02/05/16 0609 02/06/16 0320 02/07/16 0739  AST 17 19 20 17   ALT 15 19 19  16*  ALKPHOS 86 68 68 60  BILITOT 1.2 1.6* 1.3* 2.0*  PROT 6.0* 5.7* 6.0* 5.5*  ALBUMIN 3.4* 3.1* 3.2* 2.7*   No results for input(s): LIPASE, AMYLASE in the last 168 hours. No results for input(s): AMMONIA in the last 168 hours. CBC:  Recent Labs Lab 02/04/16 0932 02/04/16 2227 02/05/16 0609 02/06/16 0320 02/07/16 0739  WBC 6.5 7.0 8.0 7.1 6.4  NEUTROABS  --   --  5.2  --   --   HGB 11.4* 11.0* 10.4* 11.4* 10.1*  HCT 36.7* 35.2* 33.5* 36.6* 32.7*  MCV 90.0 91.9 90.3 92.0 90.8  PLT 292 266 239 249 216   Cardiac Enzymes:    Recent Labs Lab 02/05/16 0609 02/05/16 1335 02/05/16 1654  TROPONINI 0.03 0.03 0.03   BNP (last 3 results) No results for input(s): BNP in the last 8760 hours.  ProBNP (last 3 results) No results for input(s): PROBNP in the last 8760 hours.  CBG:  Recent Labs Lab 02/04/16 2225  GLUCAP 97    No results found for this or any previous visit (from the past 240 hour(s)).   Studies: No results found.  Scheduled Meds: . amiodarone  400 mg Oral BID  . folic acid  1 mg Oral Daily  . furosemide  40 mg Intravenous BID  .  LORazepam  0-4 mg Oral Q12H  . multivitamin with minerals  1 tablet Oral Daily  . pravastatin  40 mg Oral q morning - 10a  . rivaroxaban  20 mg Oral Q breakfast  . thiamine  100 mg Oral Daily    Continuous Infusions:    Time spent: 35mins  Baylei Siebels MD, PhD  [facility] Pager [phone]. If 7PM-7AM, please contact night-coverage at [email or web address], password [facility] 02/07/2016, 12:04 PM  LOS: 2 days

## 2016-02-07 NOTE — Discharge Instructions (Signed)

## 2016-02-07 NOTE — Progress Notes (Signed)
DAILY PROGRESS NOTE  Subjective:  Feels better today. Cardioverted to NSR. Echo shows normal systolic function - ?grade 3 diastolic dysfunction. Still appears volume overloaded.  Walked around all today, but continues to have significant shortness of breath with ambulation.  Objective:  Temp:  [98.2 F (36.8 C)] 98.2 F (36.8 C) (02/25 0546) Pulse Rate:  [70-95] 80 (02/25 0546) Resp:  [18-21] 18 (02/25 0546) BP: (93-117)/(51-68) 102/53 mmHg (02/25 0546) SpO2:  [95 %-100 %] 96 % (02/25 0546) Weight:  [276 lb 3.2 oz (125.283 kg)] 276 lb 3.2 oz (125.283 kg) (02/25 0546) Weight change: -3.2 oz (-0.091 kg)  Intake/Output from previous day: 02/24 0701 - 02/25 0700 In: 1160 [P.O.:960; I.V.:200] Out: 1851 [Urine:1850; Stool:1]  Intake/Output from this shift: Total I/O In: 240 [P.O.:240] Out: -   Medications: Current Facility-Administered Medications  Medication Dose Route Frequency Provider Last Rate Last Dose  . acetaminophen (TYLENOL) tablet 650 mg  650 mg Oral Q6H PRN Rise Patience, MD       Or  . acetaminophen (TYLENOL) suppository 650 mg  650 mg Rectal Q6H PRN Rise Patience, MD      . amiodarone (PACERONE) tablet 400 mg  400 mg Oral BID Pixie Casino, MD   400 mg at 02/07/16 1017  . folic acid (FOLVITE) tablet 1 mg  1 mg Oral Daily Rise Patience, MD   1 mg at 02/07/16 1017  . furosemide (LASIX) injection 40 mg  40 mg Intravenous BID Pixie Casino, MD   40 mg at 02/07/16 0623  . LORazepam (ATIVAN) tablet 1 mg  1 mg Oral Q6H PRN Rise Patience, MD   1 mg at 02/05/16 1655   Or  . LORazepam (ATIVAN) injection 1 mg  1 mg Intravenous Q6H PRN Rise Patience, MD      . LORazepam (ATIVAN) tablet 0-4 mg  0-4 mg Oral Q12H Rise Patience, MD      . multivitamin with minerals tablet 1 tablet  1 tablet Oral Daily Rise Patience, MD   1 tablet at 02/07/16 1017  . ondansetron (ZOFRAN) tablet 4 mg  4 mg Oral Q6H PRN Rise Patience, MD       Or  . ondansetron [facility]) injection 4 mg  4 mg Intravenous Q6H PRN Rise Patience, MD      . pravastatin (PRAVACHOL) tablet 40 mg  40 mg Oral q morning - 10a Rise Patience, MD   40 mg at 02/07/16 1017  . rivaroxaban (XARELTO) tablet 20 mg  20 mg Oral Q breakfast Florencia Reasons, MD   20 mg at 02/07/16 7628  . thiamine (VITAMIN B-1) tablet 100 mg  100 mg Oral Daily Rise Patience, MD   100 mg at 02/07/16 1017  . traMADol (ULTRAM) tablet 50 mg  50 mg Oral Daily PRN Rise Patience, MD        Physical Exam: General appearance: alert and no distress Lungs: diminished breath sounds bibasilar Heart: regular rate and rhythm Extremities: edema 1-2+ edema, diffuse ecchymosis  Lab Results: Results for orders placed or performed during the hospital encounter of 02/04/16 (from the past 48 hour(s))  Troponin I (q 6hr x 3)     Status: None   Collection Time: 02/05/16  1:35 PM  Result Value Ref Range   Troponin I 0.03 <0.031 ng/mL    Comment:        NO INDICATION OF MYOCARDIAL INJURY.   Troponin I (  q 6hr x 3)     Status: None   Collection Time: 02/05/16  4:54 PM  Result Value Ref Range   Troponin I 0.03 <0.031 ng/mL    Comment:        NO INDICATION OF MYOCARDIAL INJURY.   CBC     Status: Abnormal   Collection Time: 02/06/16  3:20 AM  Result Value Ref Range   WBC 7.1 4.0 - 10.5 K/uL   RBC 3.98 (L) 4.22 - 5.81 MIL/uL   Hemoglobin 11.4 (L) 13.0 - 17.0 g/dL   HCT 36.6 (L) 39.0 - 52.0 %   MCV 92.0 78.0 - 100.0 fL   MCH 28.6 26.0 - 34.0 pg   MCHC 31.1 30.0 - 36.0 g/dL   RDW 26.4 (H) 11.5 - 15.5 %   Platelets 249 150 - 400 K/uL  Comprehensive metabolic panel     Status: Abnormal   Collection Time: 02/06/16  3:20 AM  Result Value Ref Range   Sodium 141 135 - 145 mmol/L   Potassium 4.0 3.5 - 5.1 mmol/L   Chloride 101 101 - 111 mmol/L   CO2 26 22 - 32 mmol/L   Glucose, Bld 107 (H) 65 - 99 mg/dL   BUN 23 (H) 6 - 20 mg/dL   Creatinine, Ser 1.81 (H) 0.61 - 1.24 mg/dL   Calcium  8.9 8.9 - 10.3 mg/dL   Total Protein 6.0 (L) 6.5 - 8.1 g/dL   Albumin 3.2 (L) 3.5 - 5.0 g/dL   AST 20 15 - 41 U/L   ALT 19 17 - 63 U/L   Alkaline Phosphatase 68 38 - 126 U/L   Total Bilirubin 1.3 (H) 0.3 - 1.2 mg/dL   GFR calc non Af Amer 35 (L) >60 mL/min   GFR calc Af Amer 40 (L) >60 mL/min    Comment: (NOTE) The eGFR has been calculated using the CKD EPI equation. This calculation has not been validated in all clinical situations. eGFR's persistently <60 mL/min signify possible Chronic Kidney Disease.    Anion gap 14 5 - 15  CBC     Status: Abnormal   Collection Time: 02/07/16  7:39 AM  Result Value Ref Range   WBC 6.4 4.0 - 10.5 K/uL   RBC 3.60 (L) 4.22 - 5.81 MIL/uL   Hemoglobin 10.1 (L) 13.0 - 17.0 g/dL   HCT 32.7 (L) 39.0 - 52.0 %   MCV 90.8 78.0 - 100.0 fL   MCH 28.1 26.0 - 34.0 pg   MCHC 30.9 30.0 - 36.0 g/dL   RDW 26.0 (H) 11.5 - 15.5 %   Platelets 216 150 - 400 K/uL    Comment: REPEATED TO VERIFY PLATELET COUNT CONFIRMED BY SMEAR   Comprehensive metabolic panel     Status: Abnormal   Collection Time: 02/07/16  7:39 AM  Result Value Ref Range   Sodium 141 135 - 145 mmol/L   Potassium 3.8 3.5 - 5.1 mmol/L   Chloride 105 101 - 111 mmol/L   CO2 27 22 - 32 mmol/L   Glucose, Bld 97 65 - 99 mg/dL   BUN 23 (H) 6 - 20 mg/dL   Creatinine, Ser 1.61 (H) 0.61 - 1.24 mg/dL   Calcium 8.3 (L) 8.9 - 10.3 mg/dL   Total Protein 5.5 (L) 6.5 - 8.1 g/dL   Albumin 2.7 (L) 3.5 - 5.0 g/dL   AST 17 15 - 41 U/L   ALT 16 (L) 17 - 63 U/L   Alkaline Phosphatase 60 38 -  126 U/L   Total Bilirubin 2.0 (H) 0.3 - 1.2 mg/dL   GFR calc non Af Amer 40 (L) >60 mL/min   GFR calc Af Amer 46 (L) >60 mL/min    Comment: (NOTE) The eGFR has been calculated using the CKD EPI equation. This calculation has not been validated in all clinical situations. eGFR's persistently <60 mL/min signify possible Chronic Kidney Disease.    Anion gap 9 5 - 15    Imaging: No results  found.  Assessment:  Principal Problem:   Syncope and collapse Active Problems:   Amyloidosis (HCC)   Chronic diastolic heart failure (HCC)   Normocytic normochromic anemia   Persistent atrial fibrillation (HCC)   Syncope   Renal failure (ARF), acute on chronic (HCC)   Paroxysmal atrial fibrillation (HCC)   Acute on chronic diastolic CHF (congestive heart failure), NYHA class 1 ([place])   Uncomplicated alcohol dependence ([place])   Plan:  1. Successful DCCV to NSR today. Continue Xarelto. Still in decompensated diastolic CHF - likely a-fib + severe diastolic dysfunction (?related t amyloidosis).  Would continue IV Lasix throughout the day today , and potentially switch him to by mouth Lasix tomorrow morning. If he is tolerating his by mouth Lasix tomorrow, he may be able to be discharged with oral Lasix and close follow-up in cardiology clinic. Troponins negative. Follow creatinine. Monnie Anspach need monitor at discharge secondary to syncope - no clear etiology on telemetry here.  Time Spent Directly with Patient:  15 minutes  Length of Stay:  LOS: 2 days   Pixie Casino, MD, [facility] Attending Cardiologist [facility]  Tiago Humphrey Meredith Leeds 02/07/2016, 11:19 AM

## 2016-02-08 MED ORDER — AMIODARONE HCL 400 MG PO TABS: 400.0000 mg | ORAL_TABLET | Freq: Two times a day (BID) | ORAL | Status: DC

## 2016-02-08 MED ORDER — RIVAROXABAN 20 MG PO TABS: 20.0000 mg | ORAL_TABLET | Freq: Every day | ORAL | Status: DC

## 2016-02-08 NOTE — Discharge Summary (Signed)
Pt got discharged to home, discharge instructions provided and patient showed understanding to it, IV taken out,Telemonitor DC,pt left unit in wheelchair with all of the belongings accompanied with a family member (wife) 

## 2016-02-08 NOTE — Progress Notes (Signed)
Patient refused AM blood draw. I explained the need for the labs, but he insisted no blood would be drawn.

## 2016-02-08 NOTE — Progress Notes (Signed)
DAILY PROGRESS NOTE  Subjective:  Feels better today. Cardioverted to NSR. Echo shows normal systolic function - ?grade 3 diastolic dysfunction. Still appears volume overloaded.  Has walked today with improved SOB with ambulation.  Objective:  Temp:  [97.5 F (36.4 C)-98.6 F (37 C)] 97.5 F (36.4 C) (02/26 0607) Pulse Rate:  [62-74] 62 (02/26 0607) Resp:  [18] 18 (02/26 0607) BP: (104-106)/(62-82) 106/62 mmHg (02/26 0607) SpO2:  [98 %] 98 % (02/26 0607) Weight:  [274 lb 3.2 oz (124.376 kg)] 274 lb 3.2 oz (124.376 kg) (02/26 0607) Weight change: 13 lb 3.2 oz (5.987 kg)  Intake/Output from previous day: 02/25 0701 - 02/26 0700 In: 720 [P.O.:720] Out: -   Intake/Output from this shift:    Medications: Current Facility-Administered Medications  Medication Dose Route Frequency Provider Last Rate Last Dose  . acetaminophen (TYLENOL) tablet 650 mg  650 mg Oral Q6H PRN Rise Patience, MD       Or  . acetaminophen (TYLENOL) suppository 650 mg  650 mg Rectal Q6H PRN Rise Patience, MD      . amiodarone (PACERONE) tablet 400 mg  400 mg Oral BID Pixie Casino, MD   400 mg at 02/08/16 0902  . folic acid (FOLVITE) tablet 1 mg  1 mg Oral Daily Rise Patience, MD   1 mg at 02/08/16 9935  . furosemide (LASIX) tablet 40 mg  40 mg Oral Daily Romona Curls, RPH   40 mg at 02/08/16 7017  . LORazepam (ATIVAN) tablet 0-4 mg  0-4 mg Oral Q12H Rise Patience, MD      . multivitamin with minerals tablet 1 tablet  1 tablet Oral Daily Rise Patience, MD   1 tablet at 02/08/16 [phone]  . ondansetron (ZOFRAN) tablet 4 mg  4 mg Oral Q6H PRN Rise Patience, MD       Or  . ondansetron [facility]) injection 4 mg  4 mg Intravenous Q6H PRN Rise Patience, MD      . pravastatin (PRAVACHOL) tablet 40 mg  40 mg Oral q morning - 10a Rise Patience, MD   40 mg at 02/08/16 0902  . rivaroxaban (XARELTO) tablet 20 mg  20 mg Oral Q breakfast Florencia Reasons, MD   20 mg at 02/08/16 [phone]   . thiamine (VITAMIN B-1) tablet 100 mg  100 mg Oral Daily Rise Patience, MD   100 mg at 02/08/16 9233  . traMADol (ULTRAM) tablet 50 mg  50 mg Oral Daily PRN Rise Patience, MD        Physical Exam: General appearance: alert and no distress Lungs: clear to auscultation bilaterally Heart: regular rate and rhythm Extremities: edema 1-2+ edema, diffuse ecchymosis  Lab Results: Results for orders placed or performed during the hospital encounter of 02/04/16 (from the past 48 hour(s))  CBC     Status: Abnormal   Collection Time: 02/07/16  7:39 AM  Result Value Ref Range   WBC 6.4 4.0 - 10.5 K/uL   RBC 3.60 (L) 4.22 - 5.81 MIL/uL   Hemoglobin 10.1 (L) 13.0 - 17.0 g/dL   HCT 32.7 (L) 39.0 - 52.0 %   MCV 90.8 78.0 - 100.0 fL   MCH 28.1 26.0 - 34.0 pg   MCHC 30.9 30.0 - 36.0 g/dL   RDW 26.0 (H) 11.5 - 15.5 %   Platelets 216 150 - 400 K/uL    Comment: REPEATED TO VERIFY PLATELET COUNT CONFIRMED BY SMEAR  Comprehensive metabolic panel     Status: Abnormal   Collection Time: 02/07/16  7:39 AM  Result Value Ref Range   Sodium 141 135 - 145 mmol/L   Potassium 3.8 3.5 - 5.1 mmol/L   Chloride 105 101 - 111 mmol/L   CO2 27 22 - 32 mmol/L   Glucose, Bld 97 65 - 99 mg/dL   BUN 23 (H) 6 - 20 mg/dL   Creatinine, Ser 1.61 (H) 0.61 - 1.24 mg/dL   Calcium 8.3 (L) 8.9 - 10.3 mg/dL   Total Protein 5.5 (L) 6.5 - 8.1 g/dL   Albumin 2.7 (L) 3.5 - 5.0 g/dL   AST 17 15 - 41 U/L   ALT 16 (L) 17 - 63 U/L   Alkaline Phosphatase 60 38 - 126 U/L   Total Bilirubin 2.0 (H) 0.3 - 1.2 mg/dL   GFR calc non Af Amer 40 (L) >60 mL/min   GFR calc Af Amer 46 (L) >60 mL/min    Comment: (NOTE) The eGFR has been calculated using the CKD EPI equation. This calculation has not been validated in all clinical situations. eGFR's persistently <60 mL/min signify possible Chronic Kidney Disease.    Anion gap 9 5 - 15    Imaging: No results found.  Assessment:  Principal Problem:   Syncope and  collapse Active Problems:   Amyloidosis (HCC)   Chronic diastolic heart failure (HCC)   Normocytic normochromic anemia   Persistent atrial fibrillation (HCC)   Syncope   Renal failure (ARF), acute on chronic (HCC)   Paroxysmal atrial fibrillation (HCC)   Acute on chronic diastolic CHF (congestive heart failure), NYHA class 1 ([place])   Uncomplicated alcohol dependence ([place])   Plan:  Successful DCCV to NSR. Continue Xarelto. Still in decompensated diastolic CHF - likely a-fib + severe diastolic dysfunction (?related t amyloidosis).  Was switched to PO lasix last night with less UOP but feeling improved with less SOB on exertion.  Wishes to go home; would be ok with discharge today on BID lasix and follow up in cardiology clinic early next week with Dr. [name] or one of the NPs.  May need adjustment in lasix at that time.  Time Spent Directly with Patient:  15 minutes  Length of Stay:  LOS: 3 days   Nisreen Guise Meredith Leeds 02/08/2016, 9:37 AM

## 2016-02-08 NOTE — Discharge Summary (Signed)
Discharge Summary  Johnny Mitchell [id number] DOB: 08/09/1941  PCP: [name], MD  Admit date: 02/04/2016 Discharge date: 02/08/2016  Time spent: <51mins  Recommendations for Outpatient Follow-up:  1. F/u with cardiology early next week for chf /paf/syncope, continue adjust lasix and amiodarone dose, outpatient cardiac monitoring for syncope per cardiology 2. F/u with PMD within a two weeks for hospital discharge follow up   Discharge Diagnoses:  Active Hospital Problems   Diagnosis Date Noted  . Syncope and collapse 02/05/2016  . Paroxysmal atrial fibrillation (HCC)   . Acute on chronic diastolic CHF (congestive heart failure), NYHA class 1 ([place])   . Uncomplicated alcohol dependence ([place])   . Normocytic normochromic anemia 02/05/2016  . Persistent atrial fibrillation ([place]) 02/05/2016  . Syncope 02/05/2016  . Renal failure (ARF), acute on chronic (HCC) 02/05/2016  . Chronic diastolic heart failure ([place]) 06/01/2013  . Amyloidosis ([place]) 12/19/2009    Resolved Hospital Problems   Diagnosis Date Noted Date Resolved  No resolved problems to display.    Discharge Condition: stable  Diet recommendation: heart healthy  Filed Weights   02/06/16 1107 02/07/16 0546 02/08/16 0607  Weight: 118.389 kg (261 lb) 125.283 kg (276 lb 3.2 oz) 124.376 kg (274 lb 3.2 oz)    History of present illness:  Johnny Mitchell is a 75 y.o. male with history of chronic atrial fibrillation, chronic diastolic CHF, amyloidosis, hyperlipidemia, nonobstructive CAD who had followed up with cardiologist yesterday and had patient's amiodarone dose increased along with Lasix due to symptomatic A. fib and diastolic CHF and was originally planned to have cardioversion done tomorrow presents to the ER after patient had a syncopal episode at home. Patient states he had taken his increased dose of amiodarone and Lasix last evening at his office and had a couple of alcoholic drinks and went home. At home after  standing from a sitting position suddenly loss consciousness without any prodromal symptoms. Patient's wife witnessed the episode. Patient fell on his face and had hurt his nose which had bled. Patient regained consciousness spontaneously. Patient was brought to the ER. Patient was in A. fib at a rate going to 140s. Creatinine has increased from baseline. Cardiologist on-call was consulted. Patient is being admitted for further management of syncope. Denies any chest pain or shortness of breath at this time. Moves all extremities without difficulty.   Hospital Course:  Principal Problem:   Syncope and collapse Active Problems:   Amyloidosis (HCC)   Chronic diastolic heart failure (HCC)   Normocytic normochromic anemia   Persistent atrial fibrillation (HCC)   Syncope   Renal failure (ARF), acute on chronic (HCC)   Paroxysmal atrial fibrillation (HCC)   Acute on chronic diastolic CHF (congestive heart failure), NYHA class 1 ([place])   Uncomplicated alcohol dependence ([place])  Syncope/collpase: unclear etiology, will need ambulatory cardiac monitoring  Afib/rvr: was on amiodarone drip initially, s/p cardioversion today on 2/24, now NSR, cardiology to dose oral amiodarone. Continue xarelto.  Acute on chronic diastolic chf: with significant lower extremity edema,received iv lasix, strict i/o's. Cardiology input appreciated. Cleared to discharge home with oral lasix and close cardiology follow up early next week.  Acute kidney injury on ckd III: Cr 1.2 in 09/2015, 1.9 on admission, 1.6 on 2/25, ua on infection. Patient refused blood draw of 2/26 and want to go home, he is advised to have blood work repeated at hospital follow up appointment to close monitor renal function.  H/o amyloidosis; s/p autologus stem cell transplant in 2011,  off meds since 2015, defer to outpatient hematology   Alcohol use: drink nightly, on ciwa protocol, no overt sign of withdrawal in the hospital, did not require any prn  ativan.  DVT prophylaxis; on xarelto  Code Status: full  Family Communication: patient   Disposition Plan: home  2/26   Consultants:  cardiology  Procedures:  Cardioversion, 2/24  Antibiotics: none   Discharge Exam: BP 106/62 mmHg  Pulse 62  Temp(Src) 97.5 F (36.4 C) (Oral)  Resp 18  Ht 5\' 11"  (1.803 m)  Wt 124.376 kg (274 lb 3.2 oz)  BMI 38.26 kg/m2  SpO2 98%   General: NAD  Cardiovascular: RRR  Respiratory: CTABL  Abdomen: Soft/ND/NT, positive BS  Musculoskeletal: bilateral lower extremitypitting Edema improving  Neuro: aaox3   Discharge Instructions You were cared for by a hospitalist during your hospital stay. If you have any questions about your discharge medications or the care you received while you were in the hospital after you are discharged, you can call the unit and asked to speak with the hospitalist on call if the hospitalist that took care of you is not available. Once you are discharged, your primary care physician will handle any further medical issues. Please note that NO REFILLS for any discharge medications will be authorized once you are discharged, as it is imperative that you return to your primary care physician (or establish a relationship with a primary care physician if you do not have one) for your aftercare needs so that they can reassess your need for medications and monitor your lab values.      Discharge Instructions    Diet - low sodium heart healthy    Complete by:  As directed      Discharge instructions    Complete by:  As directed   Please perform daily weight, if weight increase 3-5pounds over three days, call cardiology.     Increase activity slowly    Complete by:  As directed             Medication List    STOP taking these medications        acyclovir 400 MG tablet  Commonly known as:  ZOVIRAX      TAKE these medications        amiodarone 400 MG tablet  Commonly known as:  PACERONE  Take 1  tablet (400 mg total) by mouth 2 (two) times daily.     furosemide 40 MG tablet  Commonly known as:  LASIX  Take 1 tablet (40 mg total) by mouth 2 (two) times daily.     oxycodone-acetaminophen 5-300 MG tablet  Commonly known as:  LYNOX  Take 1 tablet by mouth every 8 (eight) hours as needed for pain.     pravastatin 40 MG tablet  Commonly known as:  PRAVACHOL  Take 1 tablet (40 mg total) by mouth every evening.     TraMADol HCl 50 MG Tbdp  Take 50 mg by mouth daily as needed (for pain).     Turmeric 500 MG Caps  Take 1,000 mg by mouth daily.     XARELTO 20 MG Tabs tablet  Generic drug:  rivaroxaban  TAKE ONE TABLET BY MOUTH ONCE DAILY WITH  SUPPER       No Known Allergies Follow-up Information    Follow up with [name], MD. Schedule an appointment as soon as possible for a visit in 3 days.   Specialty:  Cardiology   Why:  close follow  up for chf and continue adjust lasix dose, need to repeat bmp to follow kidney function while on lasix. need to discuss with cardiology for outpatient cardiac monitoring    Contact information:   [address] [place] [address] [phone]       Follow up with [name], MD In 2 weeks.   Specialty:  Internal Medicine   Why:  hospital discharge follow up   Contact information:   [address] [place] [address] [phone]        The results of significant diagnostics from this hospitalization (including imaging, microbiology, ancillary and laboratory) are listed below for reference.    Significant Diagnostic Studies: Ct Head Wo Contrast  02/04/2016  CLINICAL DATA:  Acute onset of syncope. Bradycardia, followed by tachycardia. Initial encounter. EXAM: CT HEAD WITHOUT CONTRAST TECHNIQUE: Contiguous axial images were obtained from the base of the skull through the vertex without intravenous contrast. COMPARISON:  None. FINDINGS: There is no evidence of acute infarction, mass lesion, or intra-  or extra-axial hemorrhage on CT. Prominence of the ventricles and sulci reflects mild cortical volume loss. Diffuse periventricular and subcortical white matter change likely reflects small vessel ischemic microangiopathy. Mild cerebellar atrophy is noted. Mild chronic ischemic change extends into the basal ganglia bilaterally. The brainstem and fourth ventricle are within normal limits. The cerebral hemispheres demonstrate grossly normal gray-white differentiation. No mass effect or midline shift is seen. There is no evidence of fracture; visualized osseous structures are unremarkable in appearance. The orbits are within normal limits. The paranasal sinuses and mastoid air cells are well-aerated. Mild soft tissue swelling is noted overlying the left frontal calvarium. IMPRESSION: 1. No acute intracranial pathology seen on CT. 2. Mild cortical volume loss and diffuse small vessel ischemic microangiopathy. 3. Mild chronic ischemic change at the basal ganglia bilaterally. 4. Mild soft swelling overlying the left frontal calvarium. Electronically Signed   By: Garald Balding M.D.   On: 02/04/2016 23:53    Microbiology: No results found for this or any previous visit (from the past 240 hour(s)).   Labs: Basic Metabolic Panel:  Recent Labs Lab 02/04/16 0932 02/04/16 2227 02/05/16 0609 02/06/16 0320 02/07/16 0739  NA 142 142 141 141 141  K 4.5 3.6 3.9 4.0 3.8  CL 106 104 105 101 105  CO2 28 23 27 26 27   GLUCOSE 91 104* 107* 107* 97  BUN 26* 22* 21* 23* 23*  CREATININE 1.90* 1.76* 1.73* 1.81* 1.61*  CALCIUM 8.7 8.5* 8.2* 8.9 8.3*   Liver Function Tests:  Recent Labs Lab 02/04/16 0932 02/05/16 0609 02/06/16 0320 02/07/16 0739  AST 17 19 20 17   ALT 15 19 19  16*  ALKPHOS 86 68 68 60  BILITOT 1.2 1.6* 1.3* 2.0*  PROT 6.0* 5.7* 6.0* 5.5*  ALBUMIN 3.4* 3.1* 3.2* 2.7*   No results for input(s): LIPASE, AMYLASE in the last 168 hours. No results for input(s): AMMONIA in the last 168  hours. CBC:  Recent Labs Lab 02/04/16 0932 02/04/16 2227 02/05/16 0609 02/06/16 0320 02/07/16 0739  WBC 6.5 7.0 8.0 7.1 6.4  NEUTROABS  --   --  5.2  --   --   HGB 11.4* 11.0* 10.4* 11.4* 10.1*  HCT 36.7* 35.2* 33.5* 36.6* 32.7*  MCV 90.0 91.9 90.3 92.0 90.8  PLT 292 266 239 249 216   Cardiac Enzymes:  Recent Labs Lab 02/05/16 0609 02/05/16 1335 02/05/16 1654  TROPONINI 0.03 0.03 0.03   BNP:  BNP (last 3 results) No results for input(s): BNP in the last 8760 hours.  ProBNP (last 3 results) No results for input(s): PROBNP in the last 8760 hours.  CBG:  Recent Labs Lab 02/04/16 2225  GLUCAP 97       Signed:  Williette Loewe MD, PhD  [facility] 02/08/2016, 10:15 AM

## 2016-02-09 ENCOUNTER — Telehealth: Payer: Self-pay | Admitting: *Deleted

## 2016-02-09 ENCOUNTER — Encounter (HOSPITAL_COMMUNITY): Payer: Self-pay | Admitting: Cardiovascular Disease

## 2016-02-09 NOTE — Telephone Encounter (Signed)
Transition Care Management Follow-up Telephone Call  PCP: [name], MD  Admit date: 02/04/2016 Discharge date: 02/08/2016  Time spent: <70mins  Recommendations for Outpatient Follow-up:  1. F/u with cardiology early next week for chf /paf/syncope, continue adjust lasix and amiodarone dose, outpatient cardiac monitoring for syncope per cardiology 2. F/u with PMD within a two weeks for hospital discharge follow up   Discharge Diagnoses:  Active Hospital Problems   Diagnosis Date Noted  . Syncope and collapse 02/05/2016  . Paroxysmal atrial fibrillation (HCC)   . Acute on chronic diastolic CHF (congestive heart failure), NYHA class 1 ([place])   . Uncomplicated alcohol dependence ([place])   . Normocytic normochromic anemia 02/05/2016  . Persistent atrial fibrillation ([place]) 02/05/2016  . Syncope 02/05/2016  . Renal failure (ARF), acute on chronic (HCC) 02/05/2016  . Chronic diastolic heart failure ([place]) 06/01/2013  . 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    Discharge Condition: stable  Diet recommendation: heart healthy         How have you been since you were released from the hospital? "I'm pretty good. I'm still a little short of breath, but I'm much better than when I was in the hospital. I just finished walking."   Do you understand why you were in the hospital? yes   Do you understand the discharge instructions? yes   Where were you discharged to? Home   Items Reviewed:  Medications reviewed: yes  Allergies reviewed: yes  Dietary changes reviewed: yes, heart healthy low sodium diet  Referrals reviewed: no   Functional Questionnaire:   Activities of Daily Living (ADLs):   He states they are independent in the following: ambulation, bathing and hygiene, feeding, continence, grooming, toileting and dressing States they require  assistance with the following: None   Any transportation issues/concerns?: no   Any patient concerns? no   Confirmed importance and date/time of follow-up visits scheduled yes  Provider Appointment booked with Dr. [name] 02/12/16 11:30am  Confirmed with patient if condition begins to worsen call PCP or go to the ER.  Patient was given the office number and encouraged to call back with question or concerns.  : yes

## 2016-02-09 NOTE — Telephone Encounter (Signed)
thx

## 2016-02-10 ENCOUNTER — Other Ambulatory Visit (HOSPITAL_BASED_OUTPATIENT_CLINIC_OR_DEPARTMENT_OTHER): Payer: Medicare Other

## 2016-02-10 DIAGNOSIS — E858 Other amyloidosis: Secondary | ICD-10-CM | POA: Diagnosis not present

## 2016-02-10 DIAGNOSIS — E8589 Other amyloidosis: Secondary | ICD-10-CM

## 2016-02-10 LAB — COMPREHENSIVE METABOLIC PANEL
ALBUMIN: 3.1 g/dL — AB (ref 3.5–5.0)
ALK PHOS: 83 U/L (ref 40–150)
ALT: 17 U/L (ref 0–55)
AST: 15 U/L (ref 5–34)
Anion Gap: 11 mEq/L (ref 3–11)
BUN: 28.3 mg/dL — AB (ref 7.0–26.0)
CALCIUM: 8.8 mg/dL (ref 8.4–10.4)
CHLORIDE: 103 meq/L (ref 98–109)
CO2: 28 mEq/L (ref 22–29)
CREATININE: 1.8 mg/dL — AB (ref 0.7–1.3)
EGFR: 36 mL/min/{1.73_m2} — ABNORMAL LOW (ref >90)
GLUCOSE: 89 mg/dL (ref 70–140)
Potassium: 3.8 mEq/L (ref 3.5–5.1)
SODIUM: 141 meq/L (ref 136–145)
Total Bilirubin: 1.94 mg/dL — ABNORMAL HIGH (ref 0.20–1.20)
Total Protein: 6.4 g/dL (ref 6.4–8.3)

## 2016-02-10 LAB — CBC WITH DIFFERENTIAL/PLATELET
BASO%: 4.8 % — AB (ref 0.0–2.0)
Basophils Absolute: 0.3 10*3/uL — ABNORMAL HIGH (ref 0.0–0.1)
EOS%: 2.9 % (ref 0.0–7.0)
Eosinophils Absolute: 0.2 10*3/uL (ref 0.0–0.5)
HEMATOCRIT: 34.1 % — AB (ref 38.4–49.9)
HEMOGLOBIN: 10.7 g/dL — AB (ref 13.0–17.1)
LYMPH#: 0.7 10*3/uL — AB (ref 0.9–3.3)
LYMPH%: 12.3 % — ABNORMAL LOW (ref 14.0–49.0)
MCH: 28.2 pg (ref 27.2–33.4)
MCHC: 31.4 g/dL — ABNORMAL LOW (ref 32.0–36.0)
MCV: 89.7 fL (ref 79.3–98.0)
MONO#: 1.2 10*3/uL — AB (ref 0.1–0.9)
MONO%: 20.8 % — ABNORMAL HIGH (ref 0.0–14.0)
NEUT%: 59.2 % (ref 39.0–75.0)
NEUTROS ABS: 3.5 10*3/uL (ref 1.5–6.5)
Platelets: 282 10*3/uL (ref 140–400)
RBC: 3.8 10*6/uL — ABNORMAL LOW (ref 4.20–5.82)
RDW: 25.8 % — AB (ref 11.0–14.6)
WBC: 5.8 10*3/uL (ref 4.0–10.3)
nRBC: 0 % (ref 0–0)

## 2016-02-11 LAB — KAPPA/LAMBDA LIGHT CHAINS
IG KAPPA FREE LIGHT CHAIN: 76.58 mg/L — AB (ref 3.30–19.40)
IG LAMBDA FREE LIGHT CHAIN: 28.54 mg/L — AB (ref 5.71–26.30)
KAPPA/LAMBDA FLC RATIO: 2.68 — AB (ref 0.26–1.65)

## 2016-02-12 ENCOUNTER — Encounter: Payer: Self-pay | Admitting: Internal Medicine

## 2016-02-12 ENCOUNTER — Ambulatory Visit (INDEPENDENT_AMBULATORY_CARE_PROVIDER_SITE_OTHER): Payer: Medicare Other | Admitting: Internal Medicine

## 2016-02-12 VITALS — BP 122/62 | HR 68 | Temp 98.0°F | Ht 71.0 in | Wt 272.1 lb

## 2016-02-12 DIAGNOSIS — R55 Syncope and collapse: Secondary | ICD-10-CM | POA: Diagnosis not present

## 2016-02-12 DIAGNOSIS — I480 Paroxysmal atrial fibrillation: Secondary | ICD-10-CM

## 2016-02-12 DIAGNOSIS — I5032 Chronic diastolic (congestive) heart failure: Secondary | ICD-10-CM | POA: Diagnosis not present

## 2016-02-12 DIAGNOSIS — N179 Acute kidney failure, unspecified: Secondary | ICD-10-CM | POA: Diagnosis not present

## 2016-02-12 DIAGNOSIS — N189 Chronic kidney disease, unspecified: Secondary | ICD-10-CM

## 2016-02-12 DIAGNOSIS — Z09 Encounter for follow-up examination after completed treatment for conditions other than malignant neoplasm: Secondary | ICD-10-CM

## 2016-02-12 LAB — MULTIPLE MYELOMA PANEL, SERUM
ALBUMIN/GLOB SERPL: 1.2 (ref 0.7–1.7)
Albumin SerPl Elph-Mcnc: 3 g/dL (ref 2.9–4.4)
Alpha 1: 0.3 g/dL (ref 0.0–0.4)
Alpha2 Glob SerPl Elph-Mcnc: 0.7 g/dL (ref 0.4–1.0)
B-Globulin SerPl Elph-Mcnc: 0.9 g/dL (ref 0.7–1.3)
GAMMA GLOB SERPL ELPH-MCNC: 0.8 g/dL (ref 0.4–1.8)
GLOBULIN, TOTAL: 2.7 g/dL (ref 2.2–3.9)
IGA/IMMUNOGLOBULIN A, SERUM: 235 mg/dL (ref 61–437)
IgG, Qn, Serum: 902 mg/dL (ref 700–1600)
IgM, Qn, Serum: 43 mg/dL (ref 15–143)
Total Protein: 5.7 g/dL — ABNORMAL LOW (ref 6.0–8.5)

## 2016-02-12 NOTE — Progress Notes (Signed)
Pre visit review using our clinic review tool, if applicable. No additional management support is needed unless otherwise documented below in the visit note. 

## 2016-02-12 NOTE — Patient Instructions (Signed)
  GO TO THE FRONT DESK  Schedule a routine office visit or check up to be done in  2 months  No  fasting   Continue weighting  yourself daily  Try to do some walking and exercise daily  You need your blood check next week, if you don't see your heart doctor in the next few days, please call this office and will do your labs.

## 2016-02-12 NOTE — Progress Notes (Signed)
Subjective:    Patient ID: Johnny Mitchell, male    DOB: 1941-07-27, 75 y.o.   MRN: [id number]  DOS:  02/12/2016 Type of visit - description : Transitional care, here with his wife  Interval history: Admitted to [facility] 02/04/2016, discharge home on the 26: Was admitted due to syncope, saw cardiology, he was on Afib,  also felt to have decompensated diastolic CHF, he underwent a successful DCCV, was switch to by mouth Lasix and discharged home with close cardiology follow-up. He remains anticoagulated She also had an acute kidney injury. Has a history of alcohol  use, no withdrawal sx at the hospital.   Review of Systems  Since he left the hospital, he is taking the medications appropriately. Per his own scales his weight has decreased about 3 or 4 pounds. Heart rate is usually in the 60s Denies chest pain or other than the soreness from the fall prior to the admission. Still some difficulty breathing. Edema has decreased. No palpitations Denies any blood in the stools or in the urine    Past Medical History  Diagnosis Date  . Hypertension   . Coronary atherosclerosis of unspecified type of vessel, native or graft     Non obstructive  . Cerebrovascular disease, unspecified   . Pure hypercholesterolemia   . Diverticulosis of colon (without mention of hemorrhage)   . Benign neoplasm of colon   . Unspecified hemorrhoids without mention of complication   . Degenerative disc disease   . 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 Hemangioma   . Amyloidosis   . LBBB (left bundle branch block)   . Wide-complex tachycardia ([place]) 01/2011    Felt likely be SVT with abbarancy  . OSA (obstructive sleep apnea)   . Atrial fibrillation ([place])   . Cancer (HCC)     amyloidosis  . Edema 08/29/2013  . Hx of cardiovascular stress test     Adenosine Myoview (02/2014): Apical thinning, no ischemia, EF 48%; low risk.  . Scrotal pain 10/23/2014  . Adenomatous colon polyp  2003    max size 32mm  . Traumatic open wound of left lower leg with delayed healing     Se[facility] at [facility] on 05/29/2015  . 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 Chronic venous insufficiency 2016  . DDD (degenerative disc disease), lumbar     Dr. Katherine Roan  . Cough, persistent 09/29/2015  . Shortness of breath dyspnea     Past Surgical History  Procedure Laterality Date  . Knee arthroscopy    . Carpal tunnel release  08/2006    Dr Doy Mince at [facility] [place] - Bilateral  . 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 Back surgery  2012    Lumbar, [name] Norman  . Cardioversion N/A 07/10/2013    Procedure: CARDIOVERSION;  Surgeon: Lelon Perla, MD;  Location[facility] ENDOSCOPY;  Service: Cardiovascular;  Laterality: N/A;  . Colonoscopy w/ biopsies    . Cardioversion N/A 02/06/2016    Procedure: CARDIOVERSION;  Surgeon: Skeet Latch, MD;  Location: [facility] ENDOSCOPY;  Service: Cardiovascular;  Laterality: N/A;    Social History   Social History  . Marital Status: Married    Spouse Name: N/A  . Number of Children: 2  . Years of Education: N/A   Occupational History  . PRESIDENT     [employer]   Social History Main Topics  .  Smoking status: Never Smoker   . Smokeless tobacco: Never Used  . Alcohol Use: 0.0 oz/week    0 Standard drinks or equivalent per week     Comment: Occassionally  . Drug Use: No  . Sexual Activity: No   Other Topics Concern  . Not on file   Social History Narrative   Lives with wife.          Medication List       This list is accurate as of: 02/12/16 11:59 PM.  Always use your most recent med list.               amiodarone 400 MG tablet  Commonly known as:  PACERONE  Take 1 tablet (400 mg total) by mouth 2 (two) times daily.     furosemide 40 MG tablet  Commonly known as:  LASIX  Take 1 tablet (40 mg total) by mouth 2 (two) times daily.     oxycodone-acetaminophen 5-300 MG  tablet  Commonly known as:  LYNOX  Take 1 tablet by mouth every 8 (eight) hours as needed for pain.     pravastatin 40 MG tablet  Commonly known as:  PRAVACHOL  Take 1 tablet (40 mg total) by mouth every evening.     TraMADol HCl 50 MG Tbdp  Take 50 mg by mouth daily as needed (for pain).     Turmeric 500 MG Caps  Take 1,000 mg by mouth daily.     XARELTO 20 MG Tabs tablet  Generic drug:  rivaroxaban  TAKE ONE TABLET BY MOUTH ONCE DAILY WITH  SUPPER           Objective:   Physical Exam BP 122/62 mmHg  Pulse 68  Temp(Src) 98 F (36.7 C) (Oral)  Ht 5\' 11"  (1.803 m)  Wt 272 lb 2 oz (123.435 kg)  BMI 37.97 kg/m2  SpO2 97% General:   Well developed, well nourished . NAD.  HEENT:  Normocephalic . Face symmetric, atraumatic. Neck: Slightly increased JVD at 45 Lungs:  CTA B Normal respiratory effort, no intercostal retractions, no accessory muscle use. Heart: Seems regular, pulse in the 60s .   +/+++ pretibial edema bilaterally  Abdomen:  Not distended, soft, non-tender. No rebound or rigidity.   Skin: Not pale. Not jaundice Neurologic:  alert & oriented X3.  Speech normal, gait appropriate for age and unassisted Psych--  Cognition and judgment appear intact.  Cooperative with normal attention span and concentration.  Behavior appropriate. No anxious or depressed appearing.    Assessment & Plan:   Assessment > HTN CV: --CAD  --h/ oCVA  --P . A.  Fibrillation: admited 01-2016 (syncope), s/p DCCV>> converted --Wide-complex tachycardia 2012, felt to be SVT with aberrancy  --Venous insufficiency, edema Chronic DOE since ~ 2011 DJD-- Back pain, chronic. saw Dr.Elsner 2015, saw Dr. Katherine Roan at [facility], after the visit and see the  MRI from 2014 ---> they did not recommend surgery. Amyloidosis Sleep apnea H/o persistent cough  PLAN: Syncope: Status post admission, felt to be cardiac Paroxysmal atrial fibrillation: Status post successful  cardioversion, anticoagulated, he seems on sinus today, heart rate in the 60s. No change CHF: Currently on Lasix 40 twice a day, per his on the scales he has lost 3 to 4  pounds since the discharge from the hospital. Per our scales  He weights  4 pounds less compared to the discharge date. Acute kidney injury: Creatinine at the hospital was noted to be elevated, labs  2 days ago continue showing a creatinine of 1.8, for now I think is prudent to keep him in the same dose of Lasix with close monitoring of his BMP. Patient will call  cardiology to set up a prompt appointment, otherwise will get a BMP here next week. Patient in agreement. RTC 2 months

## 2016-02-13 ENCOUNTER — Telehealth: Payer: Self-pay | Admitting: Cardiology

## 2016-02-13 DIAGNOSIS — R0602 Shortness of breath: Secondary | ICD-10-CM

## 2016-02-13 NOTE — Telephone Encounter (Signed)
New message   Pt wants rn of Dr. [name] to call him

## 2016-02-13 NOTE — Telephone Encounter (Signed)
CALLED LEFT MESSAGE TO SEE IF RN CAN BE ASSISTANCE. WILL FORWARD TO Hilda Blades ,RN

## 2016-02-13 NOTE — Assessment & Plan Note (Signed)
Syncope: Status post admission, felt to be cardiac Paroxysmal atrial fibrillation: Status post successful cardioversion, anticoagulated, he seems on sinus today, heart rate in the 60s. No change CHF: Currently on Lasix 40 twice a day, per his on the scales he has lost 3 to 4  pounds since the discharge from the hospital. Per our scales  He weights  4 pounds less compared to the discharge date. Acute kidney injury: Creatinine at the hospital was noted to be elevated, labs 2 days ago continue showing a creatinine of 1.8, for now I think is prudent to keep him in the same dose of Lasix with close monitoring of his BMP. Patient will call  cardiology to set up a prompt appointment, otherwise will get a BMP here next week. Patient in agreement. RTC 2 months

## 2016-02-16 ENCOUNTER — Other Ambulatory Visit (HOSPITAL_COMMUNITY): Payer: Medicare Other

## 2016-02-16 NOTE — Telephone Encounter (Signed)
Left message for pt to call.

## 2016-02-17 ENCOUNTER — Ambulatory Visit (HOSPITAL_BASED_OUTPATIENT_CLINIC_OR_DEPARTMENT_OTHER): Payer: Medicare Other | Admitting: Hematology and Oncology

## 2016-02-17 ENCOUNTER — Encounter: Payer: Self-pay | Admitting: Hematology and Oncology

## 2016-02-17 VITALS — BP 123/46 | HR 77 | Temp 97.6°F | Resp 19 | Wt 262.3 lb

## 2016-02-17 DIAGNOSIS — I5032 Chronic diastolic (congestive) heart failure: Secondary | ICD-10-CM

## 2016-02-17 DIAGNOSIS — N183 Chronic kidney disease, stage 3 unspecified: Secondary | ICD-10-CM

## 2016-02-17 DIAGNOSIS — E859 Amyloidosis, unspecified: Secondary | ICD-10-CM | POA: Diagnosis not present

## 2016-02-17 NOTE — Telephone Encounter (Signed)
Spoke with pt, he has a follow up appt on 3-15. Aware dr [name] is not here this week. appt offered with pa or np, pt declined and wanted to keep the appt on 3-15. He will come by the office for bmp to check his kidney function. Questions regarding medications answered.

## 2016-02-17 NOTE — Assessment & Plan Note (Signed)
he will continue current medical management. I recommend close follow-up with primary care doctor for medication adjustment.  

## 2016-02-17 NOTE — Telephone Encounter (Signed)
Having Some Shortness of Breath, Wants to speak to [place]. Please call   Thanks

## 2016-02-17 NOTE — Assessment & Plan Note (Signed)
The patient had recent cardioversion due to syncopal episode. He had chronic bilateral lower extremity edema and follows with cardiologist closely. He recently cancelled a repeat imaging study of his heart as he stated that he had recent echocardiogram performed in February 2017. I reviewed the report of that echo and it was reported as poor quality I will defer to his cardiologist for further management. I warned the patient, if he does not have cardiology clearance which verify stability of his cardiac function, the patient may not be a candidate for enrollment in clinical trial.

## 2016-02-17 NOTE — Assessment & Plan Note (Signed)
The patient is currently followed at [facility] with plan to be enrolled in clinical trial. Further evaluation is pending as the original bone marrow biopsy is being requested to be sent to [facility] for subtyping, to make sure that it is of AL amyloidosis. I will defer to [facility] for further management. His most recent serum light chain were elevated but with near-normal ratio, compatible with chronic kidney disease causing retention of serum light chain rather than due to disease progression.

## 2016-02-17 NOTE — Progress Notes (Signed)
[place] OFFICE PROGRESS NOTE  Patient Care Team: Colon Branch, MD as PCP - General (Internal Medicine) Lelon Perla, MD (Cardiology) Heath Lark, MD as Consulting Physician (Hematology and Oncology) Myrle Sheng, MD as Referring Physician (Neurosurgery)  SUMMARY OF ONCOLOGIC HISTORY:  He was diagnosed with systemic amyloidosis after presentation with shortness of breath. He has significant evaluation including bone marrow aspirate and biopsy. 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 The patient subsequently went for maintain dense chemotherapy with Velcade for almost 2 years and then switch to Revlimid. He self discontinue Revlimid in May of 2014 due to fatigue. In July 2015, we restarted him back on Velcade due to a rising light chain. In September 2015, the patient requested to add back dexamethasone at 20 mg every week. In November 2015, the patient reduce dexamethasone to 16 mg every week along with Velcade injection. We discontinued treatment on 11/20/2014  INTERVAL HISTORY: Please see below for problem oriented charting. The patient is seen at [facility] recently with plan to be enrolled in clinical trial. Last month, the patient presented with syncopal episode and needed DC cardioversion due to arrhythmia. He continues of chronic bilateral lower extremity edema He denies recent infection. Denies chest pain or shortness of breath.  REVIEW OF SYSTEMS:   Constitutional: Denies fevers, chills or abnormal weight loss Eyes: Denies blurriness of vision Ears, nose, mouth, throat, and face: Denies mucositis or sore throat Respiratory: Denies cough, dyspnea or wheezes Cardiovascular: Denies palpitation, chest discomfort  Gastrointestinal:  Denies  nausea, heartburn or change in bowel habits Skin: Denies abnormal skin rashes Lymphatics: Denies new lymphadenopathy or easy bruising Neurological:Denies numbness, tingling or new weaknesses Behavioral/Psych: Mood is stable, no new changes  All other systems were reviewed with the patient and are negative.  I have reviewed the past medical history, past surgical history, social history and family history with the patient and they are unchanged from previous note.  ALLERGIES:  has No Known Allergies.  MEDICATIONS:  Current Outpatient Prescriptions  Medication Sig Dispense Refill  . amiodarone (PACERONE) 400 MG tablet Take 1 tablet (400 mg total) by mouth 2 (two) times daily. 60 tablet 0  . furosemide (LASIX) 40 MG tablet Take 1 tablet (40 mg total) by mouth 2 (two) times daily. 60 tablet 6  . pravastatin (PRAVACHOL) 40 MG tablet Take 1 tablet (40 mg total) by mouth every evening. (Patient taking differently: Take 40 mg by mouth every morning. ) 90 tablet 3  . TraMADol HCl 50 MG TBDP Take 50 mg by mouth daily as needed (for pain).  120 tablet   . Turmeric 500 MG CAPS Take 1,000 mg by mouth daily.    Alveda Reasons 20 MG TABS tablet TAKE ONE TABLET BY MOUTH ONCE DAILY WITH  SUPPER (Patient taking differently: TAKE ONE TABLET BY MOUTH ONCE DAILY IN MORNING) 90 tablet 1   No current facility-administered medications for this visit.    PHYSICAL EXAMINATION: ECOG PERFORMANCE STATUS: 1 - Symptomatic but completely ambulatory  Filed Vitals:   02/17/16 0943  BP: 123/46  Pulse: 77  Temp: 97.6 F (36.4 C)  Resp: 19   Filed Weights   02/17/16 0943  Weight: 262 lb 4.8 oz (118.978 kg)    GENERAL:alert, no distress and comfortable. He is overweight SKIN: skin color, texture,  turgor are normal, no rashes or significant lesions EYES: normal, Conjunctiva are injected, sclera clear HEART: He has chronic bilateral lower extremity edema NEURO: alert & oriented x 3 with fluent speech, no focal  motor/sensory deficits  LABORATORY DATA:  I have reviewed the data as listed    Component Value Date/Time   NA 141 02/10/2016 0921   NA 141 02/07/2016 0739   K 3.8 02/10/2016 0921   K 3.8 02/07/2016 0739   CL 105 02/07/2016 0739   CL 103 05/31/2013 0842   CO2 28 02/10/2016 0921   CO2 27 02/07/2016 0739   GLUCOSE 89 02/10/2016 0921   GLUCOSE 97 02/07/2016 0739   GLUCOSE 118* 05/31/2013 0842   BUN 28.3* 02/10/2016 0921   BUN 23* 02/07/2016 0739   CREATININE 1.8* 02/10/2016 0921   CREATININE 1.61* 02/07/2016 0739   CREATININE 1.90* 02/04/2016 0932   CREATININE 0.92 12/17/2011 1606   CALCIUM 8.8 02/10/2016 0921   CALCIUM 8.3* 02/07/2016 0739   PROT 5.7* 02/10/2016 0921   PROT 6.4 02/10/2016 0921   PROT 5.5* 02/07/2016 0739   ALBUMIN 3.1* 02/10/2016 0921   ALBUMIN 2.7* 02/07/2016 0739   AST 15 02/10/2016 0921   AST 17 02/07/2016 0739   ALT 17 02/10/2016 0921   ALT 16* 02/07/2016 0739   ALKPHOS 83 02/10/2016 0921   ALKPHOS 60 02/07/2016 0739   BILITOT 1.94* 02/10/2016 0921   BILITOT 2.0* 02/07/2016 0739   GFRNONAA 40* 02/07/2016 0739   GFRNONAA 63 06/10/2014 1221   GFRAA 46* 02/07/2016 0739   GFRAA 73 06/10/2014 1221    No results found for: SPEP, UPEP  Lab Results  Component Value Date   WBC 5.8 02/10/2016   NEUTROABS 3.5 02/10/2016   HGB 10.7* 02/10/2016   HCT 34.1* 02/10/2016   MCV 89.7 02/10/2016   PLT 282 02/10/2016      Chemistry      Component Value Date/Time   NA 141 02/10/2016 0921   NA 141 02/07/2016 0739   K 3.8 02/10/2016 0921   K 3.8 02/07/2016 0739   CL 105 02/07/2016 0739   CL 103 05/31/2013 0842   CO2 28 02/10/2016 0921   CO2 27 02/07/2016 0739   BUN 28.3* 02/10/2016 0921   BUN 23* 02/07/2016 0739   CREATININE 1.8* 02/10/2016 0921   CREATININE 1.61* 02/07/2016 0739   CREATININE 1.90* 02/04/2016 0932   CREATININE 0.92 12/17/2011 1606      Component Value Date/Time   CALCIUM 8.8 02/10/2016 0921   CALCIUM 8.3* 02/07/2016 0739    ALKPHOS 83 02/10/2016 0921   ALKPHOS 60 02/07/2016 0739   AST 15 02/10/2016 0921   AST 17 02/07/2016 0739   ALT 17 02/10/2016 0921   ALT 16* 02/07/2016 0739   BILITOT 1.94* 02/10/2016 0921   BILITOT 2.0* 02/07/2016 0739      ASSESSMENT & PLAN:  Amyloidosis ([place]) The patient is currently followed at [facility] with plan to be enrolled in clinical trial. Further evaluation is pending as the original bone marrow biopsy is being requested to be sent to [facility] for subtyping, to make sure that it is of AL amyloidosis. I will defer to [facility] for further management. His most recent serum light chain were elevated but with near-normal ratio, compatible with chronic kidney disease causing retention of serum light chain rather than due to disease progression.  Chronic kidney disease (CKD), stage III (moderate) he will continue current medical management. I recommend close follow-up with primary care doctor for medication  adjustment.     Chronic diastolic heart failure ([place]) The patient had recent cardioversion due to syncopal episode. He had chronic bilateral lower extremity edema and follows with cardiologist closely. He recently cancelled a repeat imaging study of his heart as he stated that he had recent echocardiogram performed in February 2017. I reviewed the report of that echo and it was reported as poor quality I will defer to his cardiologist for further management. I warned the patient, if he does not have cardiology clearance which verify stability of his cardiac function, the patient may not be a candidate for enrollment in clinical trial.   No orders of the defined types were placed in this encounter.   All questions were answered. The patient knows to call the clinic with any problems, questions or concerns. No barriers to learning was detected. I spent 15 minutes counseling the patient face to face. The total time spent in the appointment was 20 minutes and more than 50% was on  counseling and review of test results     [facility], Jevan Gaunt, MD 02/17/2016 12:00 PM

## 2016-02-19 DIAGNOSIS — M47816 Spondylosis without myelopathy or radiculopathy, lumbar region: Secondary | ICD-10-CM | POA: Diagnosis not present

## 2016-02-19 DIAGNOSIS — G8929 Other chronic pain: Secondary | ICD-10-CM | POA: Diagnosis not present

## 2016-02-20 DIAGNOSIS — R0602 Shortness of breath: Secondary | ICD-10-CM | POA: Diagnosis not present

## 2016-02-21 LAB — BASIC METABOLIC PANEL
BUN: 28 mg/dL — ABNORMAL HIGH (ref 7–25)
CALCIUM: 9 mg/dL (ref 8.6–10.3)
CO2: 26 mmol/L (ref 20–31)
CREATININE: 1.79 mg/dL — AB (ref 0.70–1.18)
Chloride: 100 mmol/L (ref 98–110)
Glucose, Bld: 78 mg/dL (ref 65–99)
Potassium: 4.8 mmol/L (ref 3.5–5.3)
Sodium: 139 mmol/L (ref 135–146)

## 2016-02-25 ENCOUNTER — Ambulatory Visit (INDEPENDENT_AMBULATORY_CARE_PROVIDER_SITE_OTHER): Payer: Medicare Other | Admitting: Cardiology

## 2016-02-25 ENCOUNTER — Encounter: Payer: Self-pay | Admitting: Cardiology

## 2016-02-25 VITALS — BP 115/73 | HR 59 | Ht 71.0 in | Wt 256.0 lb

## 2016-02-25 DIAGNOSIS — I4891 Unspecified atrial fibrillation: Secondary | ICD-10-CM | POA: Diagnosis not present

## 2016-02-25 DIAGNOSIS — I5032 Chronic diastolic (congestive) heart failure: Secondary | ICD-10-CM | POA: Diagnosis not present

## 2016-02-25 DIAGNOSIS — I2510 Atherosclerotic heart disease of native coronary artery without angina pectoris: Secondary | ICD-10-CM

## 2016-02-25 DIAGNOSIS — E7800 Pure hypercholesterolemia, unspecified: Secondary | ICD-10-CM

## 2016-02-25 DIAGNOSIS — I471 Supraventricular tachycardia, unspecified: Secondary | ICD-10-CM

## 2016-02-25 DIAGNOSIS — I10 Essential (primary) hypertension: Secondary | ICD-10-CM

## 2016-02-25 DIAGNOSIS — R55 Syncope and collapse: Secondary | ICD-10-CM

## 2016-02-25 MED ORDER — AMIODARONE HCL 400 MG PO TABS: 200.0000 mg | ORAL_TABLET | Freq: Every day | ORAL | Status: DC

## 2016-02-25 NOTE — Assessment & Plan Note (Signed)
Etiology unclear. I will arrange an event monitor to exclude arrhythmia. Patient instructed not to drive for 6 months. He was not happy with that advice.

## 2016-02-25 NOTE — Assessment & Plan Note (Signed)
Continue statin. 

## 2016-02-25 NOTE — Assessment & Plan Note (Signed)
Much improvedAfter reestablishing sinus rhythm and higher dose of Lasix. Continue Lasix at 40 mg twice a day.

## 2016-02-25 NOTE — Progress Notes (Signed)
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 LHC 10/2009: pLAD 20%, dLAD 30%, mOM2 70-80% (unchanged compared to previous), pRCA 20%, dRCA 20%.  Admitted in February of 2012 with a wide complex tachycardia. Seen by EP and rhythm was most likely felt to be SVT with aberrancy. It was felt that if symptoms recur ablation would be warranted.  Cardiac MRI (09/05/13): Moderate LVE, focal septal hypertrophy, EF 51%, global HK, mild RVE, normal RVSF, no evidence of cardiac amyloid. Nuclear study in March of 2015 showed apical thinning but no ischemia. Ejection fraction was 48%. Echo 11/15 showed EF 123456, grade 2 diastolic dysfunction, mild LAE and mildly dilated aortic root. Developed recurrent atrial fibrillation recently on low-dose amiodarone. Amiodarone was increased and patient underwent successful cardioversion 02/06/16. Also had syncopal episode. Echocardiogram limited repeated February 2017 and showed normal LV systolic function. Monitor recommended for syncope following DC. Since last seen, Patient is much improved. He denies dyspnea, Chest pain, palpitations or syncope. His pedal edema has resolved.   Current Outpatient Prescriptions  Medication Sig Dispense Refill  . amiodarone (PACERONE) 400 MG tablet Take 0.5 tablets (200 mg total) by mouth daily. 90 tablet 3  . furosemide (LASIX) 40 MG tablet Take 1 tablet (40 mg total) by mouth 2 (two) times daily. 60 tablet 6  . pravastatin (PRAVACHOL) 40 MG tablet Take 1 tablet (40 mg total) by mouth every evening. (Patient taking differently: Take 40 mg by mouth every morning. ) 90 tablet 3  . TraMADol HCl 50 MG TBDP Take 50 mg by mouth daily as needed (for pain).  120 tablet   . Turmeric 500 MG CAPS Take 1,000 mg by mouth daily.    Alveda Reasons 20 MG TABS tablet TAKE ONE TABLET BY MOUTH ONCE DAILY WITH  SUPPER (Patient taking differently: TAKE ONE TABLET BY MOUTH ONCE DAILY IN MORNING) 90  tablet 1   No current facility-administered medications for this visit.     Past Medical History  Diagnosis Date  . Hypertension   . Coronary atherosclerosis of unspecified type of vessel, native or graft     Non obstructive  . Cerebrovascular disease, unspecified   . Pure hypercholesterolemia   . Diverticulosis of colon (without mention of hemorrhage)   . Benign neoplasm of colon   . Unspecified hemorrhoids without mention of complication   . Degenerative disc disease   . 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 Hemangioma   . Amyloidosis   . LBBB (left bundle branch block)   . Wide-complex tachycardia ([place]) 01/2011    Felt likely be SVT with abbarancy  . OSA (obstructive sleep apnea)   . Atrial fibrillation ([place])   . Cancer (HCC)     amyloidosis  . Edema 08/29/2013  . Hx of cardiovascular stress test     Adenosine Myoview (02/2014): Apical thinning, no ischemia, EF 48%; low risk.  . Scrotal pain 10/23/2014  . Adenomatous colon polyp 2003    max size 3mm  . Traumatic open wound of left lower leg with delayed healing     Se[facility] at [facility] on 05/29/2015  . 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 Chronic venous insufficiency 2016  . DDD (degenerative disc disease), lumbar     Dr. Katherine Roan  . Cough, persistent 09/29/2015  . Shortness of breath dyspnea   .  Low back pain     chronic, seeing pain management    Past Surgical History  Procedure Laterality Date  . Knee arthroscopy    . Carpal tunnel release  08/2006    Dr Doy Mince at [facility] - Bilateral  . 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 Back surgery  2012    Lumbar, [name]  . Cardioversion N/A 07/10/2013    Procedure: CARDIOVERSION;  Surgeon: Lelon Perla, MD;  Location: [facility] ENDOSCOPY;  Service: Cardiovascular;  Laterality: N/A;  . Colonoscopy w/ biopsies    . Cardioversion N/A 02/06/2016    Procedure: CARDIOVERSION;   Surgeon: Skeet Latch, MD;  Location: [facility] ENDOSCOPY;  Service: Cardiovascular;  Laterality: N/A;  . Lumbar medial branch block  2017    left L2-5, S1 and right L2-5 and S1 medial branches    Social History   Social History  . Marital Status: Married    Spouse Name: N/A  . Number of Children: 2  . Years of Education: N/A   Occupational History  . PRESIDENT     [employer]   Social History Main Topics  . Smoking status: Never Smoker   . Smokeless tobacco: Never Used  . Alcohol Use: 0.0 oz/week    0 Standard drinks or equivalent per week     Comment: Occassionally  . Drug Use: No  . Sexual Activity: No   Other Topics Concern  . Not on file   Social History Narrative   Lives with wife.      Family History  Problem Relation Age of Onset  . Lung cancer Mother   . Skin cancer Father     Melanoma  . Colon cancer Neg Hx   . Colon polyps Neg Hx   . Kidney disease Neg Hx   . Esophageal cancer Neg Hx   . Diabetes Neg Hx   . Gallbladder disease Neg Hx     ROS: no fevers or chills, productive cough, hemoptysis, dysphasia, odynophagia, melena, hematochezia, dysuria, hematuria, rash, seizure activity, orthopnea, PND, pedal edema, claudication. Remaining systems are negative.  Physical Exam: Well-developed well-nourished in no acute distress.  Skin is warm and dry.  HEENT is normal.  Neck is supple.  Chest is clear to auscultation with normal expansion.  Cardiovascular exam is regular rate and rhythm.  Abdominal exam nontender or distended. No masses palpated. Extremities show no edema. neuro grossly intact  ECG Probable sinus with left bundle branch block.

## 2016-02-25 NOTE — Assessment & Plan Note (Signed)
Continue statin.No aspirin given need for anticoagulation. 

## 2016-02-25 NOTE — Assessment & Plan Note (Signed)
Continue amiodarone. 

## 2016-02-25 NOTE — Patient Instructions (Signed)
Medication Instructions:   DECREASE AMIODARONE 200 MG ONCE DAILY  Testing/Procedures:  Your physician has recommended that you wear an event monitor. Event monitors are medical devices that record the heart's electrical activity. Doctors most often [place] these monitors to diagnose arrhythmias. Arrhythmias are problems with the speed or rhythm of the heartbeat. The monitor is a small, portable device. You can wear one while you do your normal daily activities. This is usually used to diagnose what is causing palpitations/syncope (passing out).    Follow-Up:  Your physician recommends that you return for lab work in: [place]

## 2016-02-25 NOTE — Assessment & Plan Note (Signed)
Blood pressure controlled. Continue present medications. 

## 2016-02-25 NOTE — Assessment & Plan Note (Signed)
Patient appears to be back in sinus rhythm although P waves difficult to discern on electrocardiogram. Continue amiodarone but decrease dose to 200 mg daily. Continue xarelto.

## 2016-02-28 NOTE — ED Provider Notes (Signed)
CSN: [id number]     Arrival date & time 02/04/16  2126 History   First MD Initiated Contact with Patient 02/04/16 2131     Chief Complaint  Patient presents with  . Loss of Consciousness     (Consider location/radiation/quality/duration/timing/severity/associated sxs/prior Treatment) HPI Comments: 75 y/o male with history of atrial fibrillation on NOAC and nonobstructive coronary artery disease presenting with an episode of syncope in the setting of AF. The patient reports that he has been feeling increasingly short of breath over the last couple of days. He was evaluated by his cardiologist Dr. [name] on February 22 for shortness of breath and was found to be in atrial fibrillation, with plans he to pursue elective DC cardioversion on February 24. This evening however pt developed worsening dib and eventually after dinner he fainted in the kitchen. His wife witnessed the episode and reports that patient turned blue - especially around the lips. The patient then regained consciousness and did not have any confusion. From the fall, pt has a bruise. Bleeding from scalp has stopped.   ROS 10 Systems reviewed and are negative for acute change except as noted in the HPI.     Patient is a 75 y.o. male presenting with syncope. The history is provided by the patient and medical records.  Loss of Consciousness   Past Medical History  Diagnosis Date  . Hypertension   . Coronary atherosclerosis of unspecified type of vessel, native or graft     Non obstructive  . Cerebrovascular disease, unspecified   . Pure hypercholesterolemia   . Diverticulosis of colon (without mention of hemorrhage)   . Benign neoplasm of colon   . Unspecified hemorrhoids without mention of complication   . Degenerative disc disease   . 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 Hemangioma   . Amyloidosis   . LBBB (left bundle branch block)   . Wide-complex tachycardia ([place]) 01/2011    Felt  likely be SVT with abbarancy  . OSA (obstructive sleep apnea)   . Atrial fibrillation ([place])   . Cancer (HCC)     amyloidosis  . Edema 08/29/2013  . Hx of cardiovascular stress test     Adenosine Myoview (02/2014): Apical thinning, no ischemia, EF 48%; low risk.  . Scrotal pain 10/23/2014  . Adenomatous colon polyp 2003    max size 77mm  . Traumatic open wound of left lower leg with delayed healing     Seen at [facility] on 05/29/2015  . 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 Chronic venous insufficiency 2016  . DDD (degenerative disc disease), lumbar     Dr. Katherine Roan  . Cough, persistent 09/29/2015  . Shortness of breath dyspnea   . Low back pain     chronic, seeing pain management   Past Surgical History  Procedure Laterality Date  . Knee arthroscopy    . Carpal tunnel release  08/2006    Dr Doy Mince at [facility] - Bilateral  . 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 Back surgery  2012    Lumbar, [name]  . Cardioversion N/A 07/10/2013    Procedure: CARDIOVERSION;  Surgeon: Lelon Perla, MD;  Location: [facility] ENDOSCOPY;  Service: Cardiovascular;  Laterality: N/A;  . Colonoscopy w/ biopsies    . Cardioversion N/A 02/06/2016    Procedure: CARDIOVERSION;  Surgeon: Skeet Latch, MD;  Location: [facility];  Service:  Cardiovascular;  Laterality: N/A;  . Lumbar medial branch block  2017    left L2-5, S1 and right L2-5 and S1 medial branches   Family History  Problem Relation Age of Onset  . Lung cancer Mother   . Skin cancer Father     Melanoma  . Colon cancer Neg Hx   . Colon polyps Neg Hx   . Kidney disease Neg Hx   . Esophageal cancer Neg Hx   . Diabetes Neg Hx   . Gallbladder disease Neg Hx    Social History  Substance Use Topics  . Smoking status: Never Smoker   . Smokeless tobacco: Never Used  . Alcohol Use: 0.0 oz/week    0 Standard drinks or equivalent per week     Comment: Occassionally     Review of Systems  Cardiovascular: Positive for syncope.      Allergies  Review of patient's allergies indicates no known allergies.  Home Medications   Prior to Admission medications   Medication Sig Start Date End Date Taking? Authorizing Provider  furosemide (LASIX) 40 MG tablet Take 1 tablet (40 mg total) by mouth 2 (two) times daily. 02/04/16  Yes Lelon Perla, MD  pravastatin (PRAVACHOL) 40 MG tablet Take 1 tablet (40 mg total) by mouth every evening. Patient taking differently: Take 40 mg by mouth every morning.  03/21/15  Yes Lelon Perla, MD  TraMADol HCl 50 MG TBDP Take 50 mg by mouth daily as needed (for pain).  02/04/16  Yes Lelon Perla, MD  Turmeric 500 MG CAPS Take 1,000 mg by mouth daily.   Yes Historical Provider, MD  XARELTO 20 MG TABS tablet TAKE ONE TABLET BY MOUTH ONCE DAILY WITH  SUPPER Patient taking differently: TAKE ONE TABLET BY MOUTH ONCE DAILY IN MORNING 09/24/15  Yes Lelon Perla, MD  amiodarone (PACERONE) 400 MG tablet Take 0.5 tablets (200 mg total) by mouth daily. 02/25/16   Lelon Perla, MD   BP 106/62 mmHg  Pulse 62  Temp(Src) 97.5 F (36.4 C) (Oral)  Resp 18  Ht 5\' 11"  (1.803 m)  Wt 274 lb 3.2 oz (124.376 kg)  BMI 38.26 kg/m2  SpO2 98% Physical Exam  Constitutional: He is oriented to person, place, and time. He appears well-developed.  HENT:  Head: Normocephalic and atraumatic.  Eyes: Conjunctivae and EOM are normal. Pupils are equal, round, and reactive to light.  Neck: Normal range of motion. Neck supple.  Cardiovascular:  Murmur heard. Pulmonary/Chest: Effort normal and breath sounds normal. No respiratory distress. He has no wheezes.  Abdominal: Soft. Bowel sounds are normal. He exhibits no distension. There is no tenderness. There is no rebound and no guarding.  Musculoskeletal: He exhibits no edema or tenderness.  Neurological: He is alert and oriented to person, place, and time.  Skin: Skin is warm. Rash noted.   Nursing note and vitals reviewed.   ED Course  Procedures (including critical care time) Labs Review Labs Reviewed  BASIC METABOLIC PANEL - Abnormal; Notable for the following:    Glucose, Bld 104 (*)    BUN 22 (*)    Creatinine, Ser 1.76 (*)    Calcium 8.5 (*)    GFR calc non Af Amer 36 (*)    GFR calc Af Amer 42 (*)    All other components within normal limits  CBC - Abnormal; Notable for the following:    RBC 3.83 (*)    Hemoglobin 11.0 (*)    HCT  35.2 (*)    RDW 25.8 (*)    All other components within normal limits  COMPREHENSIVE METABOLIC PANEL - Abnormal; Notable for the following:    Glucose, Bld 107 (*)    BUN 21 (*)    Creatinine, Ser 1.73 (*)    Calcium 8.2 (*)    Total Protein 5.7 (*)    Albumin 3.1 (*)    Total Bilirubin 1.6 (*)    GFR calc non Af Amer 37 (*)    GFR calc Af Amer 42 (*)    All other components within normal limits  CBC WITH DIFFERENTIAL/PLATELET - Abnormal; Notable for the following:    RBC 3.71 (*)    Hemoglobin 10.4 (*)    HCT 33.5 (*)    RDW 25.9 (*)    Monocytes Absolute 1.8 (*)    Basophils Absolute 0.2 (*)    All other components within normal limits  CBC - Abnormal; Notable for the following:    RBC 3.98 (*)    Hemoglobin 11.4 (*)    HCT 36.6 (*)    RDW 26.4 (*)    All other components within normal limits  COMPREHENSIVE METABOLIC PANEL - Abnormal; Notable for the following:    Glucose, Bld 107 (*)    BUN 23 (*)    Creatinine, Ser 1.81 (*)    Total Protein 6.0 (*)    Albumin 3.2 (*)    Total Bilirubin 1.3 (*)    GFR calc non Af Amer 35 (*)    GFR calc Af Amer 40 (*)    All other components within normal limits  CBC - Abnormal; Notable for the following:    RBC 3.60 (*)    Hemoglobin 10.1 (*)    HCT 32.7 (*)    RDW 26.0 (*)    All other components within normal limits  COMPREHENSIVE METABOLIC PANEL - Abnormal; Notable for the following:    BUN 23 (*)    Creatinine, Ser 1.61 (*)    Calcium 8.3 (*)    Total Protein  5.5 (*)    Albumin 2.7 (*)    ALT 16 (*)    Total Bilirubin 2.0 (*)    GFR calc non Af Amer 40 (*)    GFR calc Af Amer 46 (*)    All other components within normal limits  URINALYSIS, ROUTINE W REFLEX MICROSCOPIC (NOT AT [facility])  TROPONIN I  TROPONIN I  TROPONIN I  TSH  PATHOLOGIST SMEAR REVIEW  CBG MONITORING, ED    Imaging Review No results found. I have personally reviewed and evaluated these images and lab results as part of my medical decision-making.   EKG Interpretation None     ED ECG REPORT   Date: 02/28/2016  Rate: 68  Rhythm: atrial fibrillation  QRS Axis: left  Intervals: normal  ST/T Wave abnormalities: nonspecific ST/T changes  Conduction Disutrbances:left bundle branch block  Narrative Interpretation:   Old EKG Reviewed: changes noted  I have personally reviewed the EKG tracing and agree with the computerized printout as noted.  MDM   Final diagnoses:  Syncope and collapse  Persistent atrial fibrillation (HCC)    Pt comes in post syncope. He has hx of PAF, on anticoagulation. Seems like over the past few days he has had increased dyspnea, and might have been in persistent afib. Syncope - likely related to the afib. May be pushing patient into CHF due to poor atrial kick. Pt is an ER cardioversion candidate, as he has  been on xarelto for years. Cardiology informed that we will be willing to help with it - the fellow thinks the procedure can be done electively since pt needs admission for syncope obs.  Pt made aware of the plan and is happy.    Varney Biles, MD 02/29/16 0006

## 2016-03-02 ENCOUNTER — Ambulatory Visit (INDEPENDENT_AMBULATORY_CARE_PROVIDER_SITE_OTHER): Payer: Medicare Other

## 2016-03-02 DIAGNOSIS — R55 Syncope and collapse: Secondary | ICD-10-CM

## 2016-03-22 ENCOUNTER — Other Ambulatory Visit: Payer: Self-pay | Admitting: Cardiology

## 2016-03-22 ENCOUNTER — Other Ambulatory Visit: Payer: Self-pay | Admitting: Hematology and Oncology

## 2016-03-22 NOTE — Telephone Encounter (Signed)
REFILL 

## 2016-04-06 ENCOUNTER — Telehealth: Payer: Self-pay | Admitting: Internal Medicine

## 2016-04-06 DIAGNOSIS — R05 Cough: Secondary | ICD-10-CM | POA: Diagnosis not present

## 2016-04-06 NOTE — Telephone Encounter (Signed)
Caller name: Self  Can be reached: [phone]   Reason for call: Refill HYDROcodone-acetaminophen (NORCO/VICODIN) 5-325 MG per tablet [id number]

## 2016-04-06 NOTE — Telephone Encounter (Signed)
Called patient to inform him of below. Patient very irate and insist that it should not take that long to get a rx. States he is coming to the office and sit until he can see Dr. [name]. Requested an appointment today. Informed him that I could check with other offices to see if there were any available appointment. Patient was not satisfied and stated that this office was slow and it shouldn't take 48-72 hours to get a rx.

## 2016-04-06 NOTE — Telephone Encounter (Signed)
Refill request for Hydrocodone-APAP 5-325mg  [Not on Active medication list] Last filled by MD on - D/C 09/29/15 patient reported "no longer taking" Patient requested to start Oxycodone-APAP 12/11/2015; was instructed to D/C any Hydrocodone and start Oxycodone-APAP 5-300mg  [Rx given] D/C Oxycodone-APAP on 02/17/16, patient reported "no longer taking" UDS: Do not find UDS and/or CSC in chart Last AEX - 02/12/16 Please see note regarding pt's behavior below; Please Advise/SLS 04/25

## 2016-04-06 NOTE — Telephone Encounter (Signed)
FYI: Please remember to inform patients that Controlled Substances [must be picked-up at office] must be given 48-72 hour notice prior to refills/SLS 04/25

## 2016-04-06 NOTE — Progress Notes (Signed)
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 LHC 10/2009: pLAD 20%, dLAD 30%, mOM2 70-80% (unchanged compared to previous), pRCA 20%, dRCA 20%.  Admitted in February of 2012 with a wide complex tachycardia. Seen by EP and rhythm was most likely felt to be SVT with aberrancy. It was felt that if symptoms recur ablation would be warranted.  Cardiac MRI (09/05/13): Moderate LVE, focal septal hypertrophy, EF 51%, global HK, mild RVE, normal RVSF, no evidence of cardiac amyloid. Nuclear study in March of 2015 showed apical thinning but no ischemia. Ejection fraction was 48%. Echo 11/15 showed EF 123456, grade 2 diastolic dysfunction, mild LAE and mildly dilated aortic root. Developed recurrent atrial fibrillation recently on low-dose amiodarone. Amiodarone was increased and patient underwent successful cardioversion 02/06/16. Also had syncopal episode. Echocardiogram limited repeated February 2017 and showed normal LV systolic function. Monitor 3/17 showed sinus with first degree AV block. Since last seen, the patient denies any dyspnea on exertion, orthopnea, PND, pedal edema, palpitations, syncope or chest pain.   Current Outpatient Prescriptions  Medication Sig Dispense Refill  . acyclovir (ZOVIRAX) 400 MG tablet TAKE ONE TABLET BY MOUTH ONCE DAILY 90 tablet 0  . amiodarone (PACERONE) 400 MG tablet Take 0.5 tablets (200 mg total) by mouth daily. 90 tablet 3  . furosemide (LASIX) 40 MG tablet Take 1 tablet (40 mg total) by mouth 2 (two) times daily. 60 tablet 6  . pravastatin (PRAVACHOL) 40 MG tablet Take 1 tablet (40 mg total) by mouth daily. KEEP OV. 90 tablet 0  . Turmeric 500 MG CAPS Take 1,000 mg by mouth daily.    Alveda Reasons 20 MG TABS tablet TAKE ONE TABLET BY MOUTH ONCE DAILY WITH  SUPPER (Patient taking differently: TAKE ONE TABLET BY MOUTH ONCE DAILY IN MORNING) 90 tablet 1   No current facility-administered medications  for this visit.     Past Medical History  Diagnosis Date  . Hypertension   . Coronary atherosclerosis of unspecified type of vessel, native or graft     Non obstructive  . Cerebrovascular disease, unspecified   . Pure hypercholesterolemia   . Diverticulosis of colon (without mention of hemorrhage)   . Benign neoplasm of colon   . Unspecified hemorrhoids without mention of complication   . Degenerative disc disease   . 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 Hemangioma   . Amyloidosis   . LBBB (left bundle branch block)   . Wide-complex tachycardia ([place]) 01/2011    Felt likely be SVT with abbarancy  . OSA (obstructive sleep apnea)   . Atrial fibrillation ([place])   . Cancer (HCC)     amyloidosis  . Edema 08/29/2013  . Hx of cardiovascular stress test     Adenosine Myoview (02/2014): Apical thinning, no ischemia, EF 48%; low risk.  . Scrotal pain 10/23/2014  . Adenomatous colon polyp 2003    max size 4mm  . Traumatic open wound of left lower leg with delayed healing     Seen at [facility] on 05/29/2015  . 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 Chronic venous insufficiency 2016  . DDD (degenerative disc disease), lumbar     Dr. Katherine Roan  . Cough, persistent 09/29/2015  . Shortness of breath dyspnea   . Low back pain     chronic, seeing pain management  Past Surgical History  Procedure Laterality Date  . Knee arthroscopy    . Carpal tunnel release  08/2006    Dr Doy Mince at [facility] - Bilateral  . 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 Back surgery  2012    Lumbar, [name]  . Cardioversion N/A 07/10/2013    Procedure: CARDIOVERSION;  Surgeon: Lelon Perla, MD;  Location: [facility] ENDOSCOPY;  Service: Cardiovascular;  Laterality: N/A;  . Colonoscopy w/ biopsies    . Cardioversion N/A 02/06/2016    Procedure: CARDIOVERSION;  Surgeon: Skeet Latch, MD;  Location: [facility] ENDOSCOPY;   Service: Cardiovascular;  Laterality: N/A;  . Lumbar medial branch block  2017    left L2-5, S1 and right L2-5 and S1 medial branches    Social History   Social History  . Marital Status: Married    Spouse Name: N/A  . Number of Children: 2  . Years of Education: N/A   Occupational History  . PRESIDENT     [employer]   Social History Main Topics  . Smoking status: Never Smoker   . Smokeless tobacco: Never Used  . Alcohol Use: 0.0 oz/week    0 Standard drinks or equivalent per week     Comment: Occassionally  . Drug Use: No  . Sexual Activity: No   Other Topics Concern  . Not on file   Social History Narrative   Lives with wife.      Family History  Problem Relation Age of Onset  . Lung cancer Mother   . Skin cancer Father     Melanoma  . Colon cancer Neg Hx   . Colon polyps Neg Hx   . Kidney disease Neg Hx   . Esophageal cancer Neg Hx   . Diabetes Neg Hx   . Gallbladder disease Neg Hx     ROS: no fevers or chills, productive cough, hemoptysis, dysphasia, odynophagia, melena, hematochezia, dysuria, hematuria, rash, seizure activity, orthopnea, PND, pedal edema, claudication. Remaining systems are negative.  Physical Exam: Well-developed well-nourished in no acute distress.  Skin is warm and dry.  HEENT is normal.  Neck is supple.  Chest is clear to auscultation with normal expansion.  Cardiovascular exam is regular rate and rhythm.  Abdominal exam nontender or distended. No masses palpated. Extremities show trace edema. neuro grossly intact

## 2016-04-06 NOTE — Telephone Encounter (Signed)
Pt called in to follow up on his refill request.  ?

## 2016-04-07 ENCOUNTER — Ambulatory Visit (INDEPENDENT_AMBULATORY_CARE_PROVIDER_SITE_OTHER): Payer: Medicare Other | Admitting: Cardiology

## 2016-04-07 ENCOUNTER — Ambulatory Visit (INDEPENDENT_AMBULATORY_CARE_PROVIDER_SITE_OTHER): Payer: Medicare Other | Admitting: Internal Medicine

## 2016-04-07 ENCOUNTER — Encounter: Payer: Self-pay | Admitting: Internal Medicine

## 2016-04-07 ENCOUNTER — Encounter: Payer: Self-pay | Admitting: Cardiology

## 2016-04-07 VITALS — BP 108/64 | HR 53 | Temp 98.1°F | Resp 16 | Ht 71.0 in | Wt 249.4 lb

## 2016-04-07 VITALS — BP 149/76 | HR 58 | Ht 71.0 in | Wt 250.0 lb

## 2016-04-07 DIAGNOSIS — I2510 Atherosclerotic heart disease of native coronary artery without angina pectoris: Secondary | ICD-10-CM

## 2016-04-07 DIAGNOSIS — I4891 Unspecified atrial fibrillation: Secondary | ICD-10-CM | POA: Diagnosis not present

## 2016-04-07 DIAGNOSIS — I481 Persistent atrial fibrillation: Secondary | ICD-10-CM

## 2016-04-07 DIAGNOSIS — Z09 Encounter for follow-up examination after completed treatment for conditions other than malignant neoplasm: Secondary | ICD-10-CM | POA: Diagnosis not present

## 2016-04-07 DIAGNOSIS — R0609 Other forms of dyspnea: Secondary | ICD-10-CM

## 2016-04-07 DIAGNOSIS — I5032 Chronic diastolic (congestive) heart failure: Secondary | ICD-10-CM

## 2016-04-07 DIAGNOSIS — E7800 Pure hypercholesterolemia, unspecified: Secondary | ICD-10-CM

## 2016-04-07 DIAGNOSIS — M545 Low back pain: Secondary | ICD-10-CM | POA: Diagnosis not present

## 2016-04-07 DIAGNOSIS — N183 Chronic kidney disease, stage 3 unspecified: Secondary | ICD-10-CM

## 2016-04-07 DIAGNOSIS — R05 Cough: Secondary | ICD-10-CM | POA: Diagnosis not present

## 2016-04-07 DIAGNOSIS — R053 Chronic cough: Secondary | ICD-10-CM

## 2016-04-07 DIAGNOSIS — I4819 Other persistent atrial fibrillation: Secondary | ICD-10-CM

## 2016-04-07 DIAGNOSIS — I10 Essential (primary) hypertension: Secondary | ICD-10-CM

## 2016-04-07 DIAGNOSIS — R059 Cough, unspecified: Secondary | ICD-10-CM

## 2016-04-07 DIAGNOSIS — I471 Supraventricular tachycardia: Secondary | ICD-10-CM

## 2016-04-07 MED ORDER — HYDROCODONE-HOMATROPINE 5-1.5 MG/5ML PO SYRP: 5.0000 mL | ORAL_SOLUTION | Freq: Two times a day (BID) | ORAL | Status: DC | PRN

## 2016-04-07 MED ORDER — FUROSEMIDE 40 MG PO TABS: 40.0000 mg | ORAL_TABLET | Freq: Every day | ORAL | Status: DC

## 2016-04-07 NOTE — Assessment & Plan Note (Signed)
Patient is describing some orthostatic symptoms. He isNot volume overloaded. Decrease Lasix to 40 mg daily with an additional 40 mg in the evening for weight gain of 2-3 pounds or worsening pedal edema. Check potassium and renal function in 2 weeks.

## 2016-04-07 NOTE — Assessment & Plan Note (Signed)
Continue present medications. 

## 2016-04-07 NOTE — Assessment & Plan Note (Signed)
Patient remains in sinus rhythm on examination. Continue amiodarone at 200 mg daily.  Continue xarelto. Recent TSH and liver functions normal. Check chest x-ray.

## 2016-04-07 NOTE — Assessment & Plan Note (Signed)
No recurrent episodes.Monitor unremarkable. Patient previously instructed not to drive for 6 months following the initial event.

## 2016-04-07 NOTE — Assessment & Plan Note (Signed)
Continue statin. No aspirin given the need for anticoagulation.

## 2016-04-07 NOTE — Telephone Encounter (Signed)
He just saw a pain specialist last month, he has taken oxycodone, now request hydrocodone. Advise patient: Needs to call pain management to avoid confusion, unintended miss-use or over-use of  medication

## 2016-04-07 NOTE — Assessment & Plan Note (Signed)
Continue statin. 

## 2016-04-07 NOTE — Assessment & Plan Note (Signed)
Continue amiodarone. 

## 2016-04-07 NOTE — Patient Instructions (Signed)
Medication Instructions:   DECREASE FUROSEMIDE 40 MG ONCE DAILY= TAKE EXTRA 40 MG OF FUROSEMIDE AS NEEDED FOR SWELLING OR WEIGHT GAIN  Labwork:  Your physician recommends that you return for lab work in: 2 WEEKS  Testing/Procedures:  A chest x-ray takes a picture of the organs and structures inside the chest, including the heart, lungs, and blood vessels. This test can show several things, including, whether the heart is enlarges; whether fluid is building up in the lungs; and whether pacemaker / defibrillator leads are still in place.   Follow-Up:  Your physician recommends that you schedule a follow-up appointment in: [place]

## 2016-04-07 NOTE — Telephone Encounter (Addendum)
Appt Scheduled at 2:00pm today; pt requested Hycodan syrup refill [last filled x6 mths ago for Acute issue], not Hydrocodone-APAP/SLS 04/26

## 2016-04-07 NOTE — Patient Instructions (Addendum)
Rest, fluids , tylenol  For cough:  Take Mucinex DM twice a day as needed until better Use OTC Nasocort or Flonase : 2 nasal sprays on each side of the nose in the morning until you feel better   Only if the pain continue, use Hycodan. Will cause drowsiness  Call if not gradually better over the next  10 days   For wax in the  ear: Use over-the-counter medication like Cerumenex as needed

## 2016-04-07 NOTE — Progress Notes (Signed)
Pre visit review using our clinic review tool, if applicable. No additional management support is needed unless otherwise documented below in the visit note/SLS  

## 2016-04-07 NOTE — Progress Notes (Signed)
Subjective:    Patient ID: Johnny Mitchell, male    DOB: Jan 24, 1941, 75 y.o.   MRN: [id number]  DOS:  04/07/2016 Type of visit - description : Acute visit  Interval history:  Symptoms started 3 days ago: Cough, chest congestion, white mucus production. Request a prescription for Hycodan. I made the patient noticed this medication  is a narcotic, he states "is the only thing that helps".    Review of Systems  Feels well otherwise. Denies fever, chills No sinus pain or congestion. No ear pain or discharge  Past Medical History  Diagnosis Date  . Hypertension   . Coronary atherosclerosis of unspecified type of vessel, native or graft     Non obstructive  . Cerebrovascular disease, unspecified   . Pure hypercholesterolemia   . Diverticulosis of colon (without mention of hemorrhage)   . Benign neoplasm of colon   . Unspecified hemorrhoids without mention of complication   . Degenerative disc disease   . 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 Hemangioma   . Amyloidosis   . LBBB (left bundle branch block)   . Wide-complex tachycardia ([place]) 01/2011    Felt likely be SVT with abbarancy  . OSA (obstructive sleep apnea)   . Atrial fibrillation ([place])   . Cancer (HCC)     amyloidosis  . Edema 08/29/2013  . Hx of cardiovascular stress test     Adenosine Myoview (02/2014): Apical thinning, no ischemia, EF 48%; low risk.  . Scrotal pain 10/23/2014  . Adenomatous colon polyp 2003    max size 19mm  . Traumatic open wound of left lower leg with delayed healing     Seen at [facility] on 05/29/2015  . 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 Chronic venous insufficiency 2016  . DDD (degenerative disc disease), lumbar     Dr. Katherine Roan  . Cough, persistent 09/29/2015  . Shortness of breath dyspnea   . Low back pain     chronic, seeing pain management    Past Surgical History  Procedure  Laterality Date  . Knee arthroscopy    . Carpal tunnel release  08/2006    Dr Doy Mince at [facility] - Bilateral  . 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 Back surgery  2012    Lumbar, [name]  . Cardioversion N/A 07/10/2013    Procedure: CARDIOVERSION;  Surgeon: Lelon Perla, MD;  Location: [facility] ENDOSCOPY;  Service: Cardiovascular;  Laterality: N/A;  . Colonoscopy w/ biopsies    . Cardioversion N/A 02/06/2016    Procedure: CARDIOVERSION;  Surgeon: Skeet Latch, MD;  Location: [facility] ENDOSCOPY;  Service: Cardiovascular;  Laterality: N/A;  . Lumbar medial branch block  2017    left L2-5, S1 and right L2-5 and S1 medial branches    Social History   Social History  . Marital Status: Married    Spouse Name: N/A  . Number of Children: 2  . Years of Education: N/A   Occupational History  . PRESIDENT     [employer]   Social History Main Topics  . Smoking status: Never Smoker   . Smokeless tobacco: Never Used  . Alcohol Use: 0.0 oz/week    0 Standard drinks or equivalent per week     Comment: Occassionally  . Drug Use: No  . Sexual Activity: No   Other Topics Concern  . Not on  file   Social History Narrative   Lives with wife.          Medication List       This list is accurate as of: 04/07/16  5:32 PM.  Always use your most recent med list.               acyclovir 400 MG tablet  Commonly known as:  ZOVIRAX  TAKE ONE TABLET BY MOUTH ONCE DAILY     amiodarone 400 MG tablet  Commonly known as:  PACERONE  Take 0.5 tablets (200 mg total) by mouth daily.     furosemide 40 MG tablet  Commonly known as:  LASIX  Take 1 tablet (40 mg total) by mouth daily.     HYDROcodone-homatropine 5-1.5 MG/5ML syrup  Commonly known as:  HYCODAN  Take 5 mLs by mouth 2 (two) times daily as needed for cough.     pravastatin 40 MG tablet  Commonly known as:  PRAVACHOL  Take 1 tablet (40 mg total) by mouth daily. KEEP OV.     Turmeric 500 MG Caps  Take 1,000 mg by mouth  daily.     XARELTO 20 MG Tabs tablet  Generic drug:  rivaroxaban  TAKE ONE TABLET BY MOUTH ONCE DAILY WITH  SUPPER           Objective:   Physical Exam BP 108/64 mmHg  Pulse 53  Temp(Src) 98.1 F (36.7 C) (Oral)  Resp 16  Ht 5\' 11"  (1.803 m)  Wt 249 lb 6 oz (113.116 kg)  BMI 34.80 kg/m2  SpO2 98% General:   Well developed, well nourished . NAD.  HEENT:  Normocephalic . Face symmetric, atraumatic ears obscured by wax, wax partially removed with a spoon by me, TMs look okay Nose not congested, sinuses no TTP Lungs:  CTA B Normal respiratory effort, no intercostal retractions, no accessory muscle use. Heart: RRR,  no murmur.  No pretibial edema bilaterally  Skin: Not pale. Not jaundice Neurologic:  alert & oriented X3.  Speech normal, gait appropriate for age and unassisted Psych--  Cognition and judgment appear intact.  Cooperative with normal attention span and concentration.  Behavior appropriate. No anxious or depressed appearing.      Assessment & Plan:   Assessment > HTN Creat increased from 1.4 to ~1.8 by the end of 2016  CV: --CAD  --h/ oCVA  --P . A.  Fibrillation: admited 01-2016 (syncope), s/p DCCV>> converted --Wide-complex tachycardia 2012, felt to be SVT with aberrancy  --Venous insufficiency, edema -- syncope, admitted 01-2016 Chronic DOE since ~ 2011 DJD-- Back pain, chronic. saw Dr.Elsner 2015, saw Dr. Katherine Roan at [facility] 2016, after the visit and see the  MRI from 2014 ---> they did not recommend surgery. Saw pain mngmt Dr Earley Favor, 2 injections  ~ 02-2016, rx ultram (per pt): no help Amyloidosis Sleep apnea H/o persistent cough  PLAN: Cough: Acute cough for 3 days, no evidence of pneumonia on clinical grounds, URI?Marland Kitchen Recommend Flonase, Mucinex, use hydrocodone only if cough persists, call if not improving DJD, chronic back pain: advise I won't  Be able to Rx long-term pain medication, needs to discuss with pain management. Cerumen  impaction: Partially clear with a spoon, rec OTCs Last creatinine 1.79.

## 2016-04-07 NOTE — Assessment & Plan Note (Signed)
Cough: Acute cough for 3 days, no evidence of pneumonia on clinical grounds, URI?Marland Kitchen Recommend Flonase, Mucinex, use hydrocodone only if cough persists, call if not improving DJD, chronic back pain: advise I won't  Be able to Rx long-term pain medication, needs to discuss with pain management. Cerumen impaction: Partially clear with a spoon, rec OTCs Last creatinine 1.79.

## 2016-04-16 ENCOUNTER — Ambulatory Visit: Payer: Medicare Other | Admitting: Internal Medicine

## 2016-04-16 DIAGNOSIS — E859 Amyloidosis, unspecified: Secondary | ICD-10-CM | POA: Diagnosis not present

## 2016-04-21 ENCOUNTER — Ambulatory Visit: Payer: Medicare Other | Admitting: Cardiology

## 2016-04-22 ENCOUNTER — Ambulatory Visit: Payer: Medicare Other | Admitting: Internal Medicine

## 2016-04-22 ENCOUNTER — Telehealth: Payer: Self-pay | Admitting: Internal Medicine

## 2016-04-28 ENCOUNTER — Telehealth: Payer: Self-pay | Admitting: Cardiology

## 2016-04-28 MED ORDER — RIVAROXABAN 20 MG PO TABS: ORAL_TABLET | ORAL | Status: DC

## 2016-04-28 NOTE — Telephone Encounter (Signed)
Refilled as requested  

## 2016-04-28 NOTE — Telephone Encounter (Signed)
New message      *STAT* If patient is at the pharmacy, call can be transferred to refill team.   1. Which medications need to be refilled? (please list name of each medication and dose if known) xarelto 20mg  2. Which pharmacy/location (including street and city if local pharmacy) is medication to be sent to? [facility] on [address]  3. Do they need a 30 day or 90 day supply?90 day Pt has 1 pill left

## 2016-05-03 NOTE — Telephone Encounter (Signed)
No Charge 

## 2016-05-03 NOTE — Telephone Encounter (Signed)
Pt was no show 04/22/16 for follow up appt, pt has not rescheduled, 1st no show, 1 cancellation, charge or no charge?

## 2016-05-06 DIAGNOSIS — E858 Other amyloidosis: Secondary | ICD-10-CM | POA: Diagnosis not present

## 2016-05-12 ENCOUNTER — Other Ambulatory Visit: Payer: Self-pay | Admitting: *Deleted

## 2016-05-12 DIAGNOSIS — R0609 Other forms of dyspnea: Secondary | ICD-10-CM

## 2016-05-12 DIAGNOSIS — N183 Chronic kidney disease, stage 3 unspecified: Secondary | ICD-10-CM

## 2016-05-12 MED ORDER — FUROSEMIDE 20 MG PO TABS: 20.0000 mg | ORAL_TABLET | Freq: Every day | ORAL | Status: DC

## 2016-05-12 MED ORDER — AMIODARONE HCL 200 MG PO TABS: 200.0000 mg | ORAL_TABLET | Freq: Every day | ORAL | Status: DC

## 2016-06-11 ENCOUNTER — Telehealth: Payer: Self-pay | Admitting: Internal Medicine

## 2016-06-11 ENCOUNTER — Encounter: Payer: Medicare Other | Admitting: Medical

## 2016-06-11 NOTE — Telephone Encounter (Signed)
Patient came in at 2:30 for an appointment that was scheduled for 2:15 with [facility] for Swelling in his leg. Informed patient that I needed to ask the provider if he could still see him, patient got loud in the lobby. I went back to [place] office and asked if he could still see the patient but was informed by Percell Miller to reschedule because seeing him would put other patients behind. Came back and informed patient and attempted to schedule him for Monday with Percell Miller. Patient stated he didn't want to see a PA, he wanted to see Dr. Larose Kells. Informed patient that Dr. Larose Kells next available was on 7/13 and scheduled him for that date.

## 2016-06-11 NOTE — Progress Notes (Signed)
This encounter was created in error - please disregard.

## 2016-06-11 NOTE — Telephone Encounter (Signed)
Unfortunately, I'm not able to see him today

## 2016-06-13 ENCOUNTER — Encounter: Payer: Self-pay | Admitting: Internal Medicine

## 2016-06-14 NOTE — Progress Notes (Signed)
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 LHC 10/2009: pLAD 20%, dLAD 30%, mOM2 70-80% (unchanged compared to previous), pRCA 20%, dRCA 20%.  Admitted in February of 2012 with a wide complex tachycardia. Seen by EP and rhythm was most likely felt to be SVT with aberrancy. It was felt that if symptoms recur ablation would be warranted.  Cardiac MRI (09/05/13): Moderate LVE, focal septal hypertrophy, EF 51%, global HK, mild RVE, normal RVSF, no evidence of cardiac amyloid. Nuclear study in March of 2015 showed apical thinning but no ischemia. Ejection fraction was 48%. Echo 11/15 showed EF 123456, grade 2 diastolic dysfunction, mild LAE and mildly dilated aortic root. Developed recurrent atrial fibrillation recently on low-dose amiodarone. Amiodarone was increased and patient underwent successful cardioversion 02/06/16. Also had syncopal episode. Echocardiogram limited repeated February 2017 and showed normal LV systolic function. Monitor 3/17 showed sinus with first degree AV block. Since last seen, He has some dyspnea on exertion. No orthopnea or PND but he is noticed increased pedal edema. No chest pain or syncope.  Current Outpatient Prescriptions  Medication Sig Dispense Refill  . acyclovir (ZOVIRAX) 400 MG tablet TAKE ONE TABLET BY MOUTH ONCE DAILY 90 tablet 0  . amiodarone (PACERONE) 200 MG tablet Take 1 tablet (200 mg total) by mouth daily. 90 tablet 0  . furosemide (LASIX) 20 MG tablet Take 1 tablet (20 mg total) by mouth daily. 90 tablet 3  . HYDROcodone-homatropine (HYCODAN) 5-1.5 MG/5ML syrup Take 5 mLs by mouth 2 (two) times daily as needed for cough. 120 mL 0  . pravastatin (PRAVACHOL) 40 MG tablet Take 1 tablet (40 mg total) by mouth daily. KEEP OV. 90 tablet 0  . rivaroxaban (XARELTO) 20 MG TABS tablet TAKE ONE TABLET BY MOUTH ONCE DAILY WITH  SUPPER 90 tablet 1  . Turmeric 500 MG CAPS Take 1,000 mg by mouth  daily.     No current facility-administered medications for this visit.     Past Medical History  Diagnosis Date  . Hypertension   . Coronary atherosclerosis of unspecified type of vessel, native or graft     Non obstructive  . Cerebrovascular disease, unspecified   . Pure hypercholesterolemia   . Diverticulosis of colon (without mention of hemorrhage)   . Benign neoplasm of colon   . Unspecified hemorrhoids without mention of complication   . Degenerative disc disease   . 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 Hemangioma   . Amyloidosis     seen [facility]  . LBBB (left bundle branch block)   . Wide-complex tachycardia ([place]) 01/2011    Felt likely be SVT with abbarancy  . OSA (obstructive sleep apnea)   . Atrial fibrillation ([place])   . Cancer (HCC)     amyloidosis  . Edema 08/29/2013  . Hx of cardiovascular stress test     Adenosine Myoview (02/2014): Apical thinning, no ischemia, EF 48%; low risk.  . Scrotal pain 10/23/2014  . Adenomatous colon polyp 2003    max size 52mm  . Traumatic open wound of left lower leg with delayed healing     Se[facility] on 05/29/2015  . 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)     Seen at [facility] on 05/29/2015  . Chronic venous insufficiency 2016  . DDD (degenerative disc disease), lumbar     Dr. Katherine Roan  . Cough, persistent  09/29/2015  . Shortness of breath dyspnea   . Low back pain     chronic, seeing pain management    Past Surgical History  Procedure Laterality Date  . Knee arthroscopy    . Carpal tunnel release  08/2006    Dr Doy Mince at [facility] - Bilateral  . 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 Back surgery  2012    Lumbar, [name]  . Cardioversion N/A 07/10/2013    Procedure: CARDIOVERSION;  Surgeon: Lelon Perla, MD;  Location: [facility] ENDOSCOPY;  Service: Cardiovascular;  Laterality: N/A;  . Colonoscopy w/ biopsies    . Cardioversion N/A 02/06/2016     Procedure: CARDIOVERSION;  Surgeon: Skeet Latch, MD;  Location: [facility] ENDOSCOPY;  Service: Cardiovascular;  Laterality: N/A;  . Lumbar medial branch block  2017    left L2-5, S1 and right L2-5 and S1 medial branches    Social History   Social History  . Marital Status: Married    Spouse Name: N/A  . Number of Children: 2  . Years of Education: N/A   Occupational History  . PRESIDENT     [employer]   Social History Main Topics  . Smoking status: Never Smoker   . Smokeless tobacco: Never Used  . Alcohol Use: 0.0 oz/week    0 Standard drinks or equivalent per week     Comment: Occassionally  . Drug Use: No  . Sexual Activity: No   Other Topics Concern  . Not on file   Social History Narrative   Lives with wife.      Family History  Problem Relation Age of Onset  . Lung cancer Mother   . Skin cancer Father     Melanoma  . Colon cancer Neg Hx   . Colon polyps Neg Hx   . Kidney disease Neg Hx   . Esophageal cancer Neg Hx   . Diabetes Neg Hx   . Gallbladder disease Neg Hx     ROS: Back pain but no fevers or chills, productive cough, hemoptysis, dysphasia, odynophagia, melena, hematochezia, dysuria, hematuria, rash, seizure activity, orthopnea, PND, claudication. Remaining systems are negative.  Physical Exam: Well-developed well-nourished in no acute distress.  Skin is warm and dry.  HEENT is normal.  Neck is supple.  Chest is clear to auscultation with normal expansion.  Cardiovascular exam is regular rate and rhythm.  Abdominal exam nontender or distended. No masses palpated. Extremities show 1-2+ edema. neuro grossly intact  Assessment and plan  1 Paroxysmal atrial fibrillation-patient appears to be in sinus rhythm on physical examination. Continue amiodarone at 200 mg daily. Check TSH, liver functions and chest x-ray. Continue xarelto. Check hemoglobin and renal function. 2 SVT-continue amiodarone. 3 hyperlipidemia-Continue statin. 4  hypertension-blood pressure controlled. Continue present medications. 5 coronary artery disease-continue statin. No aspirin given need for anticoagulation. 6 acute on chronic diastolic congestive heart failure-patient is volume overloaded. Increase Lasix to 60 mg daily. Check potassium and renal function in 1 week. Patient instructed on low sodium diet and fluid restriction. 7 amyloid  [name], MD

## 2016-06-18 ENCOUNTER — Ambulatory Visit (INDEPENDENT_AMBULATORY_CARE_PROVIDER_SITE_OTHER): Payer: Medicare Other | Admitting: Cardiology

## 2016-06-18 ENCOUNTER — Encounter: Payer: Self-pay | Admitting: Cardiology

## 2016-06-18 VITALS — BP 130/64 | HR 66 | Ht 71.0 in | Wt 257.0 lb

## 2016-06-18 DIAGNOSIS — I5032 Chronic diastolic (congestive) heart failure: Secondary | ICD-10-CM

## 2016-06-18 DIAGNOSIS — N183 Chronic kidney disease, stage 3 unspecified: Secondary | ICD-10-CM

## 2016-06-18 DIAGNOSIS — R0609 Other forms of dyspnea: Secondary | ICD-10-CM | POA: Diagnosis not present

## 2016-06-18 DIAGNOSIS — I10 Essential (primary) hypertension: Secondary | ICD-10-CM

## 2016-06-18 DIAGNOSIS — I480 Paroxysmal atrial fibrillation: Secondary | ICD-10-CM

## 2016-06-18 DIAGNOSIS — I2510 Atherosclerotic heart disease of native coronary artery without angina pectoris: Secondary | ICD-10-CM

## 2016-06-18 DIAGNOSIS — E785 Hyperlipidemia, unspecified: Secondary | ICD-10-CM

## 2016-06-18 MED ORDER — FUROSEMIDE 20 MG PO TABS: 60.0000 mg | ORAL_TABLET | Freq: Every day | ORAL | Status: DC

## 2016-06-18 MED ORDER — AMIODARONE HCL 200 MG PO TABS
200.0000 mg | ORAL_TABLET | Freq: Every day | ORAL | Status: DC
Start: 2016-06-18 — End: 2017-03-21

## 2016-06-18 NOTE — Patient Instructions (Signed)
Your physician recommends that you schedule a follow-up appointment in: 3 months with Dr. [name]  Your physician recommends that you return for lab work in: 1 week   Your physician has recommended you make the following change in your medication: Increase Lasix to 60mg  daily.  A chest x-ray takes a picture of the organs and structures inside the chest, including the heart, lungs, and blood vessels. This test can show several things, including, whether the heart is enlarges; whether fluid is building up in the lungs; and whether pacemaker / defibrillator leads are still in place.  If you need a refill on your cardiac medications before your next appointment, please call your pharmacy.

## 2016-06-24 ENCOUNTER — Ambulatory Visit: Payer: Medicare Other | Admitting: Internal Medicine

## 2016-06-24 DIAGNOSIS — Z0289 Encounter for other administrative examinations: Secondary | ICD-10-CM

## 2016-06-25 ENCOUNTER — Encounter: Payer: Self-pay | Admitting: Internal Medicine

## 2016-06-26 ENCOUNTER — Other Ambulatory Visit: Payer: Self-pay | Admitting: Cardiology

## 2016-06-30 ENCOUNTER — Ambulatory Visit: Payer: Medicare Other | Admitting: Cardiology

## 2016-07-05 ENCOUNTER — Other Ambulatory Visit: Payer: Self-pay | Admitting: Hematology and Oncology

## 2016-07-06 ENCOUNTER — Other Ambulatory Visit: Payer: Self-pay | Admitting: Hematology and Oncology

## 2016-07-08 DIAGNOSIS — M5136 Other intervertebral disc degeneration, lumbar region: Secondary | ICD-10-CM | POA: Diagnosis not present

## 2016-07-08 DIAGNOSIS — G8929 Other chronic pain: Secondary | ICD-10-CM | POA: Diagnosis not present

## 2016-07-08 DIAGNOSIS — Z79899 Other long term (current) drug therapy: Secondary | ICD-10-CM | POA: Diagnosis not present

## 2016-07-08 DIAGNOSIS — M47816 Spondylosis without myelopathy or radiculopathy, lumbar region: Secondary | ICD-10-CM | POA: Diagnosis not present

## 2016-07-19 DIAGNOSIS — R0602 Shortness of breath: Secondary | ICD-10-CM | POA: Diagnosis not present

## 2016-07-19 DIAGNOSIS — E859 Amyloidosis, unspecified: Secondary | ICD-10-CM | POA: Diagnosis not present

## 2016-08-26 ENCOUNTER — Other Ambulatory Visit: Payer: Self-pay

## 2016-08-27 MED ORDER — RIVAROXABAN 20 MG PO TABS: ORAL_TABLET | ORAL | 1 refills | Status: DC

## 2016-09-02 DIAGNOSIS — M5136 Other intervertebral disc degeneration, lumbar region: Secondary | ICD-10-CM | POA: Diagnosis not present

## 2016-09-02 DIAGNOSIS — M47816 Spondylosis without myelopathy or radiculopathy, lumbar region: Secondary | ICD-10-CM | POA: Diagnosis not present

## 2016-09-02 DIAGNOSIS — G8929 Other chronic pain: Secondary | ICD-10-CM | POA: Diagnosis not present

## 2016-09-16 DIAGNOSIS — M48061 Spinal stenosis, lumbar region without neurogenic claudication: Secondary | ICD-10-CM | POA: Diagnosis not present

## 2016-09-16 DIAGNOSIS — M4317 Spondylolisthesis, lumbosacral region: Secondary | ICD-10-CM | POA: Diagnosis not present

## 2016-09-16 DIAGNOSIS — Z6837 Body mass index (BMI) 37.0-37.9, adult: Secondary | ICD-10-CM | POA: Diagnosis not present

## 2016-09-18 DIAGNOSIS — S51012A Laceration without foreign body of left elbow, initial encounter: Secondary | ICD-10-CM | POA: Diagnosis not present

## 2016-09-18 DIAGNOSIS — M25552 Pain in left hip: Secondary | ICD-10-CM | POA: Diagnosis not present

## 2016-09-18 DIAGNOSIS — W19XXXA Unspecified fall, initial encounter: Secondary | ICD-10-CM | POA: Diagnosis not present

## 2016-09-18 DIAGNOSIS — M7022 Olecranon bursitis, left elbow: Secondary | ICD-10-CM | POA: Diagnosis not present

## 2016-09-20 DIAGNOSIS — Z23 Encounter for immunization: Secondary | ICD-10-CM | POA: Diagnosis not present

## 2016-09-23 ENCOUNTER — Encounter: Payer: Self-pay | Admitting: Internal Medicine

## 2016-09-23 ENCOUNTER — Ambulatory Visit (INDEPENDENT_AMBULATORY_CARE_PROVIDER_SITE_OTHER): Payer: Medicare Other | Admitting: Internal Medicine

## 2016-09-23 ENCOUNTER — Telehealth: Payer: Self-pay | Admitting: Cardiology

## 2016-09-23 VITALS — BP 118/60 | HR 69 | Temp 98.1°F

## 2016-09-23 DIAGNOSIS — S8992XA Unspecified injury of left lower leg, initial encounter: Secondary | ICD-10-CM

## 2016-09-23 DIAGNOSIS — I2510 Atherosclerotic heart disease of native coronary artery without angina pectoris: Secondary | ICD-10-CM | POA: Diagnosis not present

## 2016-09-23 DIAGNOSIS — I480 Paroxysmal atrial fibrillation: Secondary | ICD-10-CM | POA: Diagnosis not present

## 2016-09-23 DIAGNOSIS — R0609 Other forms of dyspnea: Secondary | ICD-10-CM

## 2016-09-23 DIAGNOSIS — N183 Chronic kidney disease, stage 3 unspecified: Secondary | ICD-10-CM

## 2016-09-23 DIAGNOSIS — E7800 Pure hypercholesterolemia, unspecified: Secondary | ICD-10-CM

## 2016-09-23 DIAGNOSIS — S8002XA Contusion of left knee, initial encounter: Secondary | ICD-10-CM | POA: Diagnosis not present

## 2016-09-23 MED ORDER — PRAVASTATIN SODIUM 40 MG PO TABS: 40.0000 mg | ORAL_TABLET | Freq: Every evening | ORAL | 11 refills | Status: DC

## 2016-09-23 MED ORDER — FUROSEMIDE 20 MG PO TABS: 60.0000 mg | ORAL_TABLET | Freq: Every day | ORAL | 3 refills | Status: DC

## 2016-09-23 NOTE — Telephone Encounter (Signed)
New Message  Pt c/o Shortness Of Breath: STAT if SOB developed within the last 24 hours or pt is noticeably SOB on the phone  1. Are you currently SOB (can you hear that pt is SOB on the phone)? No  2. How long have you been experiencing SOB? Awhile  3. Are you SOB when sitting or when up moving around? Up moving around  4. Are you currently experiencing any other symptoms? Knee problem pt fell last night  ?

## 2016-09-23 NOTE — Patient Instructions (Signed)
We are referring you to a orthopedic doctor today, if we can't get you there, go to the ER at [place] and tylenol until then  No ibuprofen, naproxen or similar medicines   Dr [name] order several tests 3 months ago, you need to do them ASAP   Also schedule a routine visit to see me at your earliest convenience   Fall Prevention in the Home  Falls can cause injuries and can affect people from all age groups. There are many simple things that you can do to make your home safe and to help prevent falls. WHAT CAN I DO ON THE OUTSIDE OF MY HOME?  Regularly repair the edges of walkways and driveways and fix any cracks.  Remove high doorway thresholds.  Trim any shrubbery on the main path into your home.  Use bright outdoor lighting.  Clear walkways of debris and clutter, including tools and rocks.  Regularly check that handrails are securely fastened and in good repair. Both sides of any steps should have handrails.  Install guardrails along the edges of any raised decks or porches.  Have leaves, snow, and ice cleared regularly.  Use sand or salt on walkways during winter months.  In the garage, clean up any spills right away, including grease or oil spills. WHAT CAN I DO IN THE BATHROOM?  Use night lights.  Install grab bars by the toilet and in the tub and shower. Do not use towel bars as grab bars.  Use non-skid mats or decals on the floor of the tub or shower.  If you need to sit down while you are in the shower, use a plastic, non-slip stool.Marland Kitchen  Keep the floor dry. Immediately clean up any water that spills on the floor.  Remove soap buildup in the tub or shower on a regular basis.  Attach bath mats securely with double-sided non-slip rug tape.  Remove throw rugs and other tripping hazards from the floor. WHAT CAN I DO IN THE BEDROOM?  Use night lights.  Make sure that a bedside light is easy to reach.  Do not use oversized bedding that drapes onto  the floor.  Have a firm chair that has side arms to use for getting dressed.  Remove throw rugs and other tripping hazards from the floor. WHAT CAN I DO IN THE KITCHEN?   Clean up any spills right away.  Avoid walking on wet floors.  Place frequently used items in easy-to-reach places.  If you need to reach for something above you, use a sturdy step stool that has a grab bar.  Keep electrical cables out of the way.  Do not use floor polish or wax that makes floors slippery. If you have to use wax, make sure that it is non-skid floor wax.  Remove throw rugs and other tripping hazards from the floor. WHAT CAN I DO IN THE STAIRWAYS?  Do not leave any items on the stairs.  Make sure that there are handrails on both sides of the stairs. Fix handrails that are broken or loose. Make sure that handrails are as long as the stairways.  Check any carpeting to make sure that it is firmly attached to the stairs. Fix any carpet that is loose or worn.  Avoid having throw rugs at the top or bottom of stairways, or secure the rugs with carpet tape to prevent them from moving.  Make sure that you have a light switch at the top of the stairs and  the bottom of the stairs. If you do not have them, have them installed. WHAT ARE SOME OTHER FALL PREVENTION TIPS?  Wear closed-toe shoes that fit well and support your feet. Wear shoes that have rubber soles or low heels.  When you use a stepladder, make sure that it is completely opened and that the sides are firmly locked. Have someone hold the ladder while you are using it. Do not climb a closed stepladder.  Add color or contrast paint or tape to grab bars and handrails in your home. Place contrasting color strips on the first and last steps.  Use mobility aids as needed, such as canes, walkers, scooters, and crutches.  Turn on lights if it is dark. Replace any light bulbs that burn out.  Set up furniture so that there are clear paths. Keep the  furniture in the same spot.  Fix any uneven floor surfaces.  Choose a carpet design that does not hide the edge of steps of a stairway.  Be aware of any and all pets.  Review your medicines with your healthcare provider. Some medicines can cause dizziness or changes in blood pressure, which increase your risk of falling. Talk with your health care provider about other ways that you can decrease your risk of falls. This may include working with a physical therapist or trainer to improve your strength, balance, and endurance.   This information is not intended to replace advice given to you by your health care provider. Make sure you discuss any questions you have with your health care provider.   Document Released: 11/19/2002 Document Revised: 04/15/2015 Document Reviewed: 01/03/2015 Elsevier Interactive Patient Education [employer].

## 2016-09-23 NOTE — Progress Notes (Signed)
Subjective:    Patient ID: Johnny Mitchell, male    DOB: 01/29/1941, 75 y.o.   MRN: [id number]  DOS:  09/23/2016 Type of visit - description : Acute visit Interval history: Last night, he was sleeping, apparently he was getting up to go to the bathroom and the next thing he knew he fell on his left knee. Immediately developed severe pain and swelling.. I asked him if he passed out, he states " I don't know", he could not describe the incident better but at the end stated he is certain he did not passout   Review of Systems Denies chest pain or difficulty breathing No nausea, vomiting, diarrhea Denies other injuries and specifically no neck, back or hip pain.   Past Medical History:  Diagnosis Date  . Adenomatous colon polyp 2003   max size 52mm  . Amyloidosis    seen at [facility]  . Atrial fibrillation ([employer] [employer])   . Benign neoplasm of colon   . Cancer (HCC)    amyloidosis  . Cerebrovascular disease, unspecified   . Chronic venous insufficiency 2016  . Coronary atherosclerosis of unspecified type of vessel, native or graft    Non obstructive  . Cough, persistent 09/29/2015  . DDD (degenerative disc disease), lumbar    Dr. Katherine Roan  . Degenerative disc disease   . Diverticulosis of colon (without mention of hemorrhage)   . Edema 08/29/2013  . Hemangioma   . Hx of cardiovascular stress test    Adenosine Myoview (02/2014): Apical thinning, no ischemia, EF 48%; low risk.  Marland Kitchen Hypertension   . LBBB (left bundle branch block)   . Low back pain    chronic, seeing pain management  . 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 OSA (obstructive sleep apnea)   . Pure hypercholesterolemia   . Scrotal pain 10/23/2014  . Shortness of breath dyspnea   . 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 Traumatic open wound of left lower leg with delayed healing    Seen at [facility] on 05/29/2015  .  Unspecified hemorrhoids without mention of complication   . Wide-complex tachycardia ([place]) 01/2011   Felt likely be SVT with abbarancy    Past Surgical History:  Procedure Laterality Date  . BACK SURGERY  2012   Lumbar, [name]  . 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 CARDIOVERSION N/A 07/10/2013   Procedure: CARDIOVERSION;  Surgeon: Lelon Perla, MD;  Location: [facility] ENDOSCOPY;  Service: Cardiovascular;  Laterality: N/A;  . CARDIOVERSION N/A 02/06/2016   Procedure: CARDIOVERSION;  Surgeon: Skeet Latch, MD;  Location: [place];  Service: Cardiovascular;  Laterality: N/A;  . CARPAL TUNNEL RELEASE  08/2006   Dr Doy Mince at [place]    . KNEE ARTHROSCOPY    . Lumbar Medial Branch Block  2017   left L2-5, S1 and right L2-5 and S1 medial branches    Social History   Social History  . Marital status: Married    Spouse name: N/A  . Number of children: 2  . Years of education: N/A   Occupational History  . PRESIDENT [employer]   Social History Main Topics  . Smoking status: Never Smoker  . Smokeless tobacco: Never Used  . Alcohol use 0.0 oz/week     Comment: Occassionally  . Drug use:  No  . Sexual activity: No   Other Topics Concern  . Not on file   Social History Narrative   Lives with wife.          Medication List       Accurate as of 09/23/16  4:59 PM. Always use your most recent med list.          acyclovir 400 MG tablet Commonly known as:  ZOVIRAX TAKE ONE TABLET BY MOUTH ONCE DAILY   amiodarone 200 MG tablet Commonly known as:  PACERONE Take 1 tablet (200 mg total) by mouth daily.   furosemide 20 MG tablet Commonly known as:  LASIX Take 40 mg by mouth.   furosemide 20 MG tablet Commonly known as:  LASIX Take 3 tablets (60 mg total) by mouth daily.   oxyCODONE 5 MG immediate release tablet Commonly known as:  Oxy IR/ROXICODONE Take 1 tablet by mouth daily as  needed.   pravastatin 40 MG tablet Commonly known as:  PRAVACHOL Take 1 tablet (40 mg total) by mouth every evening.   rivaroxaban 20 MG Tabs tablet Commonly known as:  XARELTO TAKE ONE TABLET BY MOUTH ONCE DAILY WITH  SUPPER   Turmeric 500 MG Caps Take 1,000 mg by mouth daily.          Objective:   Physical Exam BP 118/60 (BP Location: Right Arm, Patient Position: Sitting, Cuff Size: Normal)   Pulse 69   Temp 98.1 F (36.7 C) (Oral)   SpO2 95%  General:   Well developed, well nourished patient needed a Wilcher to reach the examining room.Marland Kitchen  HEENT:  Normocephalic . Face symmetric, atraumatic Lungs:  CTA B Normal respiratory effort, no intercostal retractions, no accessory muscle use. Heart: RRR,  no murmur.  No pretibial edema bilaterally  Left knee: Definite swollen, exquisitely tender to palpation, slightly warm and red. Unable to bend. Pedal pulse wnl Neurologic:  alert & oriented X3.  Speech normal, gait appropriate for age and unassisted Psych--  Cognition and judgment appear intact.  Cooperative with normal attention span and concentration.  Behavior appropriate. No anxious or depressed appearing.      Assessment & Plan:   Assessment > HTN Creat increased from 1.4 to ~1.8 by the end of 2016  CV: --CAD  --Chronic diastolic CHF --h/ oCVA  --P . A.  Fibrillation: admited 01-2016 (syncope), s/p DCCV>> converted --Wide-complex tachycardia 2012, felt to be SVT with aberrancy  --Venous insufficiency, edema Chronic DOE since ~ 2011 DJD-- Back pain, chronic. saw Dr.Elsner 2015, saw Dr. Katherine Roan at [facility], after the visit and see the  MRI from 2014 ---> they did not recommend surgery. Saw pain mngmt Dr Earley Favor, 2 injections  ~ 02-2016, rx ultram (per pt): no help Amyloidosis Sleep apnea H/o persistent cough  PLAN: Injury, left knee:  likely has a effusion, patient is unable to bend. Needs to be seen by the specialist ASAP, declined to go to the  ER, I will ask a orthopedic doctor to see him today however if I am unable to set the appointment, he knows to go to the ER. He is anticoagulated, he may have hemarthrosis and need a tap. I am somewhat concerned about the fall, is hard to say what happened but apparently no syncope, he was getting out of bed so possibly he was sleepy. A. fib, anticoagulated: Seen by cardiology few months ago, did not proceed with blood work as prescribed, encouraged to call cardiology ASAP. RTC to this  office at his earliest convenience, due for a checkup  Today, I spent more than  25  min with the patient: >50% of the time counseling regards the need to  take care of the knee today, possibly going to the ER, coordinating his care. Multiple questions answered to the best of my ability

## 2016-09-23 NOTE — Telephone Encounter (Signed)
Returned call to patient. He said his SOB has been ongoing for several years and he feels its been getting worse. He denies increased swelling at this time. He was at the orthopedic office because he fell last night and had to hurry off the phone.  He wanted to schedule with Dr [name]. He is due for 3 month f/u appt.  Appt scheduled for 10/04/16. Patient verbalized understanding. As he was hurrying off the phone he asked if he could stop his Xarelto for upcoming procedure with Dr Ellene Route. I advised patient to have Dr Clarice Pole office to fax a clearance. I don't see one in EPIC at this time. Routing to [employer].

## 2016-09-23 NOTE — Progress Notes (Signed)
Pre visit review using our clinic review tool, if applicable. No additional management support is needed unless otherwise documented below in the visit note. 

## 2016-09-23 NOTE — Assessment & Plan Note (Signed)
Injury, left knee:  likely has a effusion, patient is unable to bend. Needs to be seen by the specialist ASAP, declined to go to the ER, I will ask a orthopedic doctor to see him today however if I am unable to set the appointment, he knows to go to the ER. He is anticoagulated, he may have hemarthrosis and need a tap. I am somewhat concerned about the fall, is hard to say what happened but apparently no syncope, he was getting out of bed so possibly he was sleepy. A. fib, anticoagulated: Seen by cardiology few months ago, did not proceed with blood work as prescribed, encouraged to call cardiology ASAP. RTC to this office at his earliest convenience, due for a checkup

## 2016-09-24 NOTE — Telephone Encounter (Signed)
,  Follow up scheduled he will need to bwe seen for clearance.

## 2016-09-26 NOTE — Progress Notes (Signed)
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 LHC 10/2009: pLAD 20%, dLAD 30%, mOM2 70-80% (unchanged compared to previous), pRCA 20%, dRCA 20%.  Admitted in February of 2012 with a wide complex tachycardia. Seen by EP and rhythm was most likely felt to be SVT with aberrancy. It was felt that if symptoms recur ablation would be warranted.  Cardiac MRI (09/05/13): Moderate LVE, focal septal hypertrophy, EF 51%, global HK, mild RVE, normal RVSF, no evidence of cardiac amyloid. Nuclear study in March of 2015 showed apical thinning but no ischemia. Ejection fraction was 48%. Echo 11/15 showed EF 123456, grade 2 diastolic dysfunction, mild LAE and mildly dilated aortic root. Developed recurrent atrial fibrillation recently on low-dose amiodarone. Amiodarone was increased and patient underwent successful cardioversion 02/06/16. Also had syncopal episode. Echocardiogram limited repeated February 2017 and showed normal LV systolic function. Monitor 3/17 showed sinus with first degree AV block. Since last seen, patient recently fell and injured his left knee. There is a hemarthrosis that is now being followed by orthopedics. He denies dyspnea, chest pain, palpitations or syncope. Minimal pedal edema.  Current Outpatient Prescriptions  Medication Sig Dispense Refill  . acyclovir (ZOVIRAX) 400 MG tablet TAKE ONE TABLET BY MOUTH ONCE DAILY 90 tablet 0  . amiodarone (PACERONE) 200 MG tablet Take 1 tablet (200 mg total) by mouth daily. 90 tablet 1  . furosemide (LASIX) 40 MG tablet Take 80 mg by mouth daily.    . pravastatin (PRAVACHOL) 40 MG tablet Take 1 tablet (40 mg total) by mouth every evening. 30 tablet 11  . rivaroxaban (XARELTO) 20 MG TABS tablet TAKE ONE TABLET BY MOUTH ONCE DAILY WITH  SUPPER 90 tablet 1  . traMADol (ULTRAM) 50 MG tablet Take by mouth every 6 (six) hours as needed.    . Turmeric 500 MG CAPS Take 1,000 mg by mouth  daily.     No current facility-administered medications for this visit.      Past Medical History:  Diagnosis Date  . Adenomatous colon polyp 2003   max size 96mm  . Amyloidosis    seen at [facility]  . Atrial fibrillation ([place])   . Benign neoplasm of colon   . Cancer (HCC)    amyloidosis  . Cerebrovascular disease, unspecified   . Chronic venous insufficiency 2016  . Coronary atherosclerosis of unspecified type of vessel, native or graft    Non obstructive  . Cough, persistent 09/29/2015  . DDD (degenerative disc disease), lumbar    Dr. Katherine Roan  . Degenerative disc disease   . Diverticulosis of colon (without mention of hemorrhage)   . Edema 08/29/2013  . Hemangioma   . Hx of cardiovascular stress test    Adenosine Myoview (02/2014): Apical thinning, no ischemia, EF 48%; low risk.  Marland Kitchen Hypertension   . LBBB (left bundle branch block)   . Low back pain    chronic, seeing pain management  . 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)    Seen at [facility] on 05/29/2015  . OSA (obstructive sleep apnea)   . Pure hypercholesterolemia   . Scrotal pain 10/23/2014  . Shortness of breath dyspnea   . 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 Traumatic open wound of left lower leg with delayed healing    Seen at [facility] on 05/29/2015  . Unspecified hemorrhoids without mention of complication   . Wide-complex tachycardia (  [place]) 01/2011   Felt likely be SVT with abbarancy    Past Surgical History:  Procedure Laterality Date  . BACK SURGERY  2012   Lumbar, [name]  . 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 CARDIOVERSION N/A 07/10/2013   Procedure: CARDIOVERSION;  Surgeon: Lelon Perla, MD;  Location: [facility] ENDOSCOPY;  Service: Cardiovascular;  Laterality: N/A;  . CARDIOVERSION N/A 02/06/2016   Procedure: CARDIOVERSION;  Surgeon: Skeet Latch, MD;  Location: [place];  Service: Cardiovascular;  Laterality: N/A;  . CARPAL  TUNNEL RELEASE  08/2006   Dr Doy Mince at [place]    . KNEE ARTHROSCOPY    . Lumbar Medial Branch Block  2017   left L2-5, S1 and right L2-5 and S1 medial branches    Social History   Social History  . Marital status: Married    Spouse name: N/A  . Number of children: 2  . Years of education: N/A   Occupational History  . PRESIDENT [employer]   Social History Main Topics  . Smoking status: Never Smoker  . Smokeless tobacco: Never Used  . Alcohol use 0.0 oz/week     Comment: Occassionally  . Drug use: No  . Sexual activity: No   Other Topics Concern  . Not on file   Social History Narrative   Lives with wife.      Family History  Problem Relation Age of Onset  . Lung cancer Mother   . Skin cancer Father     Melanoma  . Colon cancer Neg Hx   . Colon polyps Neg Hx   . Kidney disease Neg Hx   . Esophageal cancer Neg Hx   . Diabetes Neg Hx   . Gallbladder disease Neg Hx     ROS: Knee pain but no fevers or chills, productive cough, hemoptysis, dysphasia, odynophagia, melena, hematochezia, dysuria, hematuria, rash, seizure activity, orthopnea, PND, claudication. Remaining systems are negative.  Physical Exam: Well-developed well-nourished in no acute distress.  Skin is warm and dry.  HEENT is normal.  Neck is supple.  Chest is clear to auscultation with normal expansion.  Cardiovascular exam is regular rate and rhythm.  Abdominal exam nontender or distended. No masses palpated. Extremities show erythematous left knee tender to palpation with effusion neuro grossly intact  ECG- Question sinus with first-degree AV block, left bundle branch block.   A/P  1 Paroxysmal atrial fibrillation-continue amiodarone. We will again order chest x-ray, TSH, liver functions, hemoglobin and renal function. Discussion today concerning anticoagulation. He fell approximately one week ago causing trauma to his  left knee and apparently has hemarthrosis. He is concerned about the risk of bleeding. He feels that he is at risk for falling again and request to   discontinue all anticoagulation. I explained the high risk of CVA and he still would like to be off. I will therefore discontinue this medication. He would like for his leg to heal and then he will see me back in 3 months. We will consider referral for watchman in the future.  2 Supraventricular tachycardia-continue amiodarone.  3 hyperlipidemia-continue statin.  4 hypertension-blood pressure controlled. Continue present medications.  5 coronary artery disease-continue statin. No aspirin given need for anticoagulation.  6 amyloid  7 chronic diastolic congestive heart failure-continue present dose of Lasix. Check potassium and renal function.  [name], MD

## 2016-09-27 DIAGNOSIS — S8002XD Contusion of left knee, subsequent encounter: Secondary | ICD-10-CM | POA: Diagnosis not present

## 2016-09-29 ENCOUNTER — Telehealth: Payer: Self-pay | Admitting: Cardiology

## 2016-09-29 DIAGNOSIS — S8012XA Contusion of left lower leg, initial encounter: Secondary | ICD-10-CM | POA: Diagnosis not present

## 2016-09-29 DIAGNOSIS — S8002XD Contusion of left knee, subsequent encounter: Secondary | ICD-10-CM | POA: Diagnosis not present

## 2016-09-29 NOTE — Telephone Encounter (Signed)
Left msg for patient to call. 

## 2016-09-29 NOTE — Telephone Encounter (Signed)
New message      Pt request to talk to the nurse.  He would not tell me what he wanted

## 2016-10-01 NOTE — Telephone Encounter (Signed)
Spoke with pt, questions regarding xarelto and follow up appointment answered.

## 2016-10-01 NOTE — Telephone Encounter (Signed)
Left message for pt to call.

## 2016-10-04 ENCOUNTER — Other Ambulatory Visit: Payer: Self-pay | Admitting: Hematology and Oncology

## 2016-10-04 ENCOUNTER — Ambulatory Visit (INDEPENDENT_AMBULATORY_CARE_PROVIDER_SITE_OTHER): Payer: Medicare Other | Admitting: Cardiology

## 2016-10-04 ENCOUNTER — Encounter: Payer: Self-pay | Admitting: Cardiology

## 2016-10-04 ENCOUNTER — Other Ambulatory Visit: Payer: Self-pay | Admitting: Cardiology

## 2016-10-04 ENCOUNTER — Ambulatory Visit (HOSPITAL_COMMUNITY)
Admission: RE | Admit: 2016-10-04 | Discharge: 2016-10-04 | Disposition: A | Discharge status: Home / Self Care | Payer: Medicare Other | Source: Ambulatory Visit | Attending: Cardiology | Admitting: Cardiology

## 2016-10-04 VITALS — BP 90/40 | HR 65 | Ht 71.0 in | Wt 265.0 lb

## 2016-10-04 DIAGNOSIS — J90 Pleural effusion, not elsewhere classified: Secondary | ICD-10-CM | POA: Diagnosis not present

## 2016-10-04 DIAGNOSIS — I509 Heart failure, unspecified: Secondary | ICD-10-CM | POA: Diagnosis not present

## 2016-10-04 DIAGNOSIS — I4891 Unspecified atrial fibrillation: Secondary | ICD-10-CM

## 2016-10-04 DIAGNOSIS — I2510 Atherosclerotic heart disease of native coronary artery without angina pectoris: Secondary | ICD-10-CM

## 2016-10-04 DIAGNOSIS — I5032 Chronic diastolic (congestive) heart failure: Secondary | ICD-10-CM | POA: Diagnosis not present

## 2016-10-04 DIAGNOSIS — E784 Other hyperlipidemia: Secondary | ICD-10-CM | POA: Diagnosis not present

## 2016-10-04 DIAGNOSIS — E7849 Other hyperlipidemia: Secondary | ICD-10-CM

## 2016-10-04 NOTE — Patient Instructions (Signed)
Medication Instructions:   STOP XARELTO  Labwork:  Your physician recommends that you HAVE LAB WORK TODAY  Testing/Procedures:  A chest x-ray takes a picture of the organs and structures inside the chest, including the heart, lungs, and blood vessels. This test can show several things, including, whether the heart is enlarges; whether fluid is building up in the lungs; and whether pacemaker / defibrillator leads are still in place.   Follow-Up:  Your physician recommends that you schedule a follow-up appointment in: [place]

## 2016-10-05 LAB — BASIC METABOLIC PANEL
BUN: 25 mg/dL (ref 7–25)
CHLORIDE: 105 mmol/L (ref 98–110)
CO2: 26 mmol/L (ref 20–31)
Calcium: 8.3 mg/dL — ABNORMAL LOW (ref 8.6–10.3)
Creat: 1.72 mg/dL — ABNORMAL HIGH (ref 0.70–1.18)
GLUCOSE: 101 mg/dL — AB (ref 65–99)
Potassium: 4.6 mmol/L (ref 3.5–5.3)
SODIUM: 142 mmol/L (ref 135–146)

## 2016-10-05 LAB — CBC
HEMATOCRIT: 25.6 % — AB (ref 38.5–50.0)
HEMOGLOBIN: 7.7 g/dL — AB (ref 13.2–17.1)
MCH: 24.4 pg — ABNORMAL LOW (ref 27.0–33.0)
MCHC: 30.1 g/dL — AB (ref 32.0–36.0)
MCV: 81 fL (ref 80.0–100.0)
Platelets: 404 10*3/uL — ABNORMAL HIGH (ref 140–400)
RBC: 3.16 MIL/uL — ABNORMAL LOW (ref 4.20–5.80)
RDW: 31.4 % — ABNORMAL HIGH (ref 11.0–15.0)
WBC: 6.9 10*3/uL (ref 3.8–10.8)

## 2016-10-05 LAB — HEPATIC FUNCTION PANEL
ALK PHOS: 73 U/L (ref 40–115)
ALT: 8 U/L — ABNORMAL LOW (ref 9–46)
AST: 13 U/L (ref 10–35)
Albumin: 3.2 g/dL — ABNORMAL LOW (ref 3.6–5.1)
BILIRUBIN INDIRECT: 0.8 mg/dL (ref 0.2–1.2)
BILIRUBIN TOTAL: 1.1 mg/dL (ref 0.2–1.2)
Bilirubin, Direct: 0.3 mg/dL — ABNORMAL HIGH (ref <=0.2)
TOTAL PROTEIN: 5.9 g/dL — AB (ref 6.1–8.1)

## 2016-10-05 LAB — TSH: TSH: 2.81 m[IU]/L (ref 0.40–4.50)

## 2016-10-06 DIAGNOSIS — S8002XD Contusion of left knee, subsequent encounter: Secondary | ICD-10-CM | POA: Diagnosis not present

## 2016-10-06 DIAGNOSIS — S8012XA Contusion of left lower leg, initial encounter: Secondary | ICD-10-CM | POA: Diagnosis not present

## 2016-10-14 ENCOUNTER — Other Ambulatory Visit (HOSPITAL_COMMUNITY): Payer: Self-pay | Admitting: Neurological Surgery

## 2016-10-14 ENCOUNTER — Other Ambulatory Visit: Payer: Self-pay | Admitting: Neurological Surgery

## 2016-10-14 DIAGNOSIS — M4317 Spondylolisthesis, lumbosacral region: Secondary | ICD-10-CM

## 2016-10-15 ENCOUNTER — Other Ambulatory Visit: Payer: Self-pay | Admitting: Hematology and Oncology

## 2016-10-28 ENCOUNTER — Ambulatory Visit (HOSPITAL_COMMUNITY)
Admission: RE | Admit: 2016-10-28 | Discharge: 2016-10-28 | Disposition: A | Discharge status: Home / Self Care | Payer: Medicare Other | Source: Ambulatory Visit | Attending: Neurological Surgery | Admitting: Neurological Surgery

## 2016-10-28 DIAGNOSIS — Z79899 Other long term (current) drug therapy: Secondary | ICD-10-CM | POA: Insufficient documentation

## 2016-10-28 DIAGNOSIS — Z9481 Bone marrow transplant status: Secondary | ICD-10-CM | POA: Diagnosis not present

## 2016-10-28 DIAGNOSIS — Z7901 Long term (current) use of anticoagulants: Secondary | ICD-10-CM | POA: Diagnosis not present

## 2016-10-28 DIAGNOSIS — M47898 Other spondylosis, sacral and sacrococcygeal region: Secondary | ICD-10-CM | POA: Insufficient documentation

## 2016-10-28 DIAGNOSIS — M47818 Spondylosis without myelopathy or radiculopathy, sacral and sacrococcygeal region: Secondary | ICD-10-CM | POA: Diagnosis not present

## 2016-10-28 DIAGNOSIS — Z981 Arthrodesis status: Secondary | ICD-10-CM

## 2016-10-28 DIAGNOSIS — M545 Low back pain: Secondary | ICD-10-CM | POA: Diagnosis not present

## 2016-10-28 DIAGNOSIS — M4317 Spondylolisthesis, lumbosacral region: Secondary | ICD-10-CM

## 2016-10-28 DIAGNOSIS — R0602 Shortness of breath: Secondary | ICD-10-CM | POA: Diagnosis not present

## 2016-10-28 DIAGNOSIS — M4726 Other spondylosis with radiculopathy, lumbar region: Secondary | ICD-10-CM | POA: Diagnosis not present

## 2016-10-28 MED ORDER — ONDANSETRON HCL 4 MG/2ML IJ SOLN
4.0000 mg | Freq: Four times a day (QID) | INTRAMUSCULAR | Status: DC | PRN
Start: 2016-10-28 — End: 2016-10-29

## 2016-10-28 MED ORDER — IOPAMIDOL (ISOVUE-M 200) INJECTION 41%
20.0000 mL | Freq: Once | INTRAMUSCULAR | Status: AC
  Administered 2016-10-28: 16 mL via INTRATHECAL

## 2016-10-28 MED ORDER — LIDOCAINE HCL 1 % IJ SOLN
INTRAMUSCULAR | Status: AC
  Filled 2016-10-28: qty 10

## 2016-10-28 MED ORDER — LIDOCAINE HCL (PF) 1 % IJ SOLN
5.0000 mL | Freq: Once | INTRAMUSCULAR | Status: AC
Start: 2016-10-28 — End: 2016-10-28
  Administered 2016-10-28: 5 mL via INTRADERMAL

## 2016-10-28 MED ORDER — DIAZEPAM 5 MG PO TABS
ORAL_TABLET | ORAL | Status: AC
  Administered 2016-10-28: 10 mg via ORAL
  Filled 2016-10-28: qty 2

## 2016-10-28 MED ORDER — OXYCODONE-ACETAMINOPHEN 5-325 MG PO TABS: 1.0000 | ORAL_TABLET | ORAL | Status: DC | PRN

## 2016-10-28 MED ORDER — DIAZEPAM 5 MG PO TABS
10.0000 mg | ORAL_TABLET | Freq: Once | ORAL | Status: AC
  Administered 2016-10-28: 10 mg via ORAL

## 2016-10-28 MED ORDER — IOPAMIDOL (ISOVUE-M 200) INJECTION 41%
INTRAMUSCULAR | Status: AC
  Administered 2016-10-28: 16 mL via INTRATHECAL
  Filled 2016-10-28: qty 10

## 2016-10-28 NOTE — Discharge Instructions (Signed)
Myelography, Care After °These instructions give you information on caring for yourself after your procedure. Your doctor may also give you more specific instructions. Call your doctor if you have any problems or questions after your procedure. °HOME CARE °· Rest the first day. °· When you rest, lie flat, with your head slightly raised (elevated). °· Avoid heavy lifting and activity for 48 hours, or as told by your doctor. °· You may take the bandage (dressing) off one day after the test, or as told by your doctor. °· Take all medicines only as told by your doctor. °· Ask your doctor when it is okay to take a shower or bath. °· Ask your doctor when your test results will be ready and how you can get them. Make sure you follow up and get your results. °· Do not drink alcohol for 24 hours, or as told by your doctor. °· Drink enough fluid to keep your pee (urine) clear or pale yellow. °GET HELP IF:  °· You have a fever. °· You have a headache. °· You feel sick to your stomach (nauseous) or throw up (vomit). °· You have pain or cramping in your belly (abdomen). °GET HELP RIGHT AWAY IF:  °· You have a headache with a stiff neck or fever. °· You have trouble breathing. °· Any of the places where the needles were put in are: °¨ Puffy (swollen) or red. °¨ Sore or hot to the touch. °¨ Draining yellowish-white fluid (pus). °¨ Bleeding. °MAKE SURE YOU: °· Understand these instructions. °· Will watch your condition. °· Will get help right away if you are not doing well or get worse. °This information is not intended to replace advice given to you by your health care provider. Make sure you discuss any questions you have with your health care provider. °Document Released: 09/07/2008 Document Revised: 12/20/2014 Document Reviewed: 09/11/2015 °Elsevier Interactive Patient Education © 2017 Elsevier Inc[employer] ° °

## 2016-10-28 NOTE — Procedures (Signed)
CHIEF COMPLAINT:                                          Back pain.  HISTORY OF PRESENT ILLNESS:                     Johnny Mitchell is a 75 year old individual who has had previous surgery to decompress and stabilize his spine at the L4-L5 level.  This was done around 2013 by Dr. Pamalee Leyden in [place].  He notes that he has had continued problems with chronic back pain since that time and he characterizes the pain as being centralized in his back and coming on him when he stands for any length of time, more than a few minutes, or he walks any distance.  He notes that he gets relief when he sits down and when he lies down, but the pain has been a constant feature for him to the point that it has limited his activity to a substantial degree.  He is seen now for further consultation and evaluation.  Since his surgery in 2013, he had followups with Dr. Retia Passe in 2015 was his last visit and at that time he had had a CAT scan that was done a year ago, but the results were not noted.  The results of that study demonstrate, though, that he has evidence of an amply patent central spinal canal.  In addition, he has evidence of the interbody spacer being placed at L4-L5 with a widely patent canal at L4-L5, but no hard evidence of a solid arthrodesis at that level.  The pedicle screws are in good position.  No loosening was noted on those films.  I had seen the patient back in 2014 when those studies were performed, but no followup specifically was performed by me.  The patient is seen now for further consultation and evaluation about the nature of his chronic back pain.    DATA:                                                              Today in the office, we obtained an AP and lateral radiograph of the lumbar spine with flexion and extension views.  The radiographs demonstrate that there appears to be a grade 1 spondylolisthesis at L4-L5.  Not much motion is noted across the segments.  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  There also appears to be a slight lateral listhesis of L4 on L5 on the AP image, with L4 being shifted slightly to the right on L5.  Again, on the images themselves no loosening of the screws can be appreciated.    PAST MEDICAL HISTORY:                                Past medical history is notable that Johnny Mitchell has had some significant difficulties with shortness of breath.  He was seen by Dr. [name] from cardiology  and he notes that he is on some Xarelto, in addition to a number of cardia meds.  The patient notes also that he had amyloidosis.  He has since been cured of, as he has had a bone marrow transplant that was done at [facility] a number of years ago.    PHYSICAL EXAMINATION:                                On physical examination, he will stand straight and erect with modest difficulty.     . Extremities - Distal lower extremity strength appears good in the tibialis anterior and the gastrocs, as he can toe and heel walk, but his proximal leg strength demonstrates weakness in the iliopsoas, particularly worse on the left than on the right but present in both lower extremities.    NEUROLOGICAL EXAMINATION:             . Deep Tendon Reflexes - He has absent reflexes in the patellae and the Achilles both.    IMPRESSION:                                                   The patient has evidence of having had a decompression arthrodesis at L4-L5.  I noted that the best way to assess whether he has an arthrodesis there, now five years out from his surgery, would be with a CAT scan and a myelogram.  This would help demonstrate whether there is any other compressive phenomenon, particularly at L2-3 or higher, where he is exhibiting signs and symptoms of weakness in the proximal musculature.  It may be possible that  it is simply from deconditioning, also.  However, the fact that he has proximal leg weakness that affects his balance is significant.  I discussed myelography with him, indicating the nature of the procedure done as an outpatient.  However, he will have to be off the Xarelto in order to do this.  We will see if we can get clearance from Dr. [name] in that regard and we will plan on scheduling a myelogram at his earliest convenience.  I did note that the patient had been seen by Dr. Gillis Santa in [place] for pain management techniques and apparently there was some plan to do some fact rhizotomies to help control his pain, but the patient notes that he did not follow through on that.  We will assess his spine more fully with a myelogram and a postmyelogram CAT scan to see what can be done to help manage this pain in his back.  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 16 mL Findings: Mixed injection, moderate spondylosis in mid lumbar spine. Further evaluation with CT scanning.

## 2016-11-03 DIAGNOSIS — M48061 Spinal stenosis, lumbar region without neurogenic claudication: Secondary | ICD-10-CM | POA: Diagnosis not present

## 2016-11-09 ENCOUNTER — Other Ambulatory Visit: Payer: Self-pay | Admitting: Cardiology

## 2016-11-09 DIAGNOSIS — N183 Chronic kidney disease, stage 3 unspecified: Secondary | ICD-10-CM

## 2016-11-09 DIAGNOSIS — R0609 Other forms of dyspnea: Secondary | ICD-10-CM

## 2016-11-09 NOTE — Telephone Encounter (Signed)
Rx(s) sent to pharmacy electronically.  

## 2016-11-12 ENCOUNTER — Telehealth: Payer: Self-pay | Admitting: Internal Medicine

## 2016-11-12 DIAGNOSIS — J40 Bronchitis, not specified as acute or chronic: Secondary | ICD-10-CM | POA: Diagnosis not present

## 2016-11-12 NOTE — Telephone Encounter (Signed)
Relation to [id number] Call back number:[phone]   Reason for call:  Patient requesting Hydrocodone Cough Medicine, please advise patient directly

## 2016-11-12 NOTE — Telephone Encounter (Signed)
Informed Pt that we are unable to Rx prescription for Hycodan w/o OV. Pt stated the problem was that Dr. Larose Kells doesn't have any appointments today. Informed him that PCP is indeed full. Was informed Pt would find new PCP care office. I apologized to Pt for him feeling that way and offered appt with [facility]. He already had an appt scheduled w/ Dr. Alain Marion but requested to cancel. Appt cancelled as requested by Pt.

## 2016-11-13 ENCOUNTER — Ambulatory Visit: Payer: Medicare Other | Admitting: Internal Medicine

## 2016-11-15 NOTE — Telephone Encounter (Signed)
Rx never picked up by Pt. Rx shredded.

## 2016-11-17 MED ORDER — IOPAMIDOL (ISOVUE-M 200) INJECTION 41%: 16.0000 mL | Freq: Once | INTRAMUSCULAR | Status: AC

## 2016-11-17 NOTE — Addendum Note (Signed)
Encounter addended by: Gerhard Munch on: 11/17/2016  1:24 PM<BR>    Actions taken: Imaging Exam ended, Order list changed

## 2016-11-19 DIAGNOSIS — M4726 Other spondylosis with radiculopathy, lumbar region: Secondary | ICD-10-CM | POA: Diagnosis not present

## 2016-11-19 DIAGNOSIS — M48061 Spinal stenosis, lumbar region without neurogenic claudication: Secondary | ICD-10-CM | POA: Diagnosis not present

## 2016-11-19 DIAGNOSIS — M5136 Other intervertebral disc degeneration, lumbar region: Secondary | ICD-10-CM | POA: Diagnosis not present

## 2016-11-26 ENCOUNTER — Encounter: Payer: Self-pay | Admitting: Medical

## 2016-11-26 ENCOUNTER — Ambulatory Visit (INDEPENDENT_AMBULATORY_CARE_PROVIDER_SITE_OTHER): Payer: Medicare Other | Admitting: Medical

## 2016-11-26 VITALS — BP 110/78 | HR 78 | Temp 98.1°F | Ht 71.0 in | Wt 257.0 lb

## 2016-11-26 DIAGNOSIS — J069 Acute upper respiratory infection, unspecified: Secondary | ICD-10-CM

## 2016-11-26 DIAGNOSIS — I2510 Atherosclerotic heart disease of native coronary artery without angina pectoris: Secondary | ICD-10-CM | POA: Diagnosis not present

## 2016-11-26 DIAGNOSIS — R05 Cough: Secondary | ICD-10-CM

## 2016-11-26 DIAGNOSIS — R059 Cough, unspecified: Secondary | ICD-10-CM

## 2016-11-26 MED ORDER — DOXYCYCLINE HYCLATE 100 MG PO TABS: 100.0000 mg | ORAL_TABLET | Freq: Two times a day (BID) | ORAL | 0 refills | Status: DC

## 2016-11-26 MED ORDER — HYDROCODONE-HOMATROPINE 5-1.5 MG/5ML PO SYRP: 5.0000 mL | ORAL_SOLUTION | Freq: Three times a day (TID) | ORAL | 0 refills | Status: DC | PRN

## 2016-11-26 NOTE — Progress Notes (Signed)
Subjective:    Patient ID: Johnny Mitchell, male    DOB: [birth year], 75 y.o.   MRN: [id number]  HPI  Pt in with one week of cough,congestion and runny nose.  No sinus pressure.  Pt feels some pnd. Pt sick for one week.  Pt states cough keeping him up at night. No fever,no chills or sweats.   No hx of chronic bronchitis or sinus infection. No wheezing. Pt thinks he needs antibiotic.   Review of Systems  Constitutional: Negative for chills, fatigue and fever.  HENT: Positive for congestion and postnasal drip. Negative for ear pain, hearing loss, sinus pain, sinus pressure, sore throat and trouble swallowing.   Respiratory: Positive for cough. Negative for chest tightness, shortness of breath and wheezing.   Cardiovascular: Negative for chest pain and palpitations.  Gastrointestinal: Negative for abdominal pain and blood in stool.  Musculoskeletal: Negative for arthralgias and gait problem.  Skin: Negative for rash.  Neurological: Negative for dizziness and headaches.  Hematological: Negative for adenopathy. Does not bruise/bleed easily.  Psychiatric/Behavioral: Negative for agitation and confusion.    Past Medical History:  Diagnosis Date  . Adenomatous colon polyp 2003   max size 67mm  . Amyloidosis    seen at [facility]  . Atrial fibrillation ([place])   . Benign neoplasm of colon   . Cancer (HCC)    amyloidosis  . Cerebrovascular disease, unspecified   . Chronic venous insufficiency 2016  . Coronary atherosclerosis of unspecified type of vessel, native or graft    Non obstructive  . Cough, persistent 09/29/2015  . DDD (degenerative disc disease), lumbar    Dr. Katherine Roan  . Degenerative disc disease   . Diverticulosis of colon (without mention of hemorrhage)   . Edema 08/29/2013  . Hemangioma   . Hx of cardiovascular stress test    Adenosine Myoview (02/2014): Apical thinning, no ischemia, EF 48%; low risk.  Marland Kitchen Hypertension   . LBBB (left bundle branch block)   .  Low back pain    chronic, seeing pain management  . 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)    Seen at [facility] on 05/29/2015  . OSA (obstructive sleep apnea)   . Pure hypercholesterolemia   . Scrotal pain 10/23/2014  . Shortness of breath dyspnea   . 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 Traumatic open wound of left lower leg with delayed healing    Seen at [facility] on 05/29/2015  . Unspecified hemorrhoids without mention of complication   . Wide-complex tachycardia ([place]) 01/2011   Felt likely be SVT with abbarancy     Social History   Social History  . Marital status: Married    Spouse name: N/A  . Number of children: 2  . Years of education: N/A   Occupational History  . PRESIDENT [employer]   Social History Main Topics  . Smoking status: Never Smoker  . Smokeless tobacco: Never Used  . Alcohol use 0.0 oz/week     Comment: Occassionally  . Drug use: No  . Sexual activity: No   Other Topics Concern  . Not on file   Social History Narrative   Lives with wife.      Past Surgical History:  Procedure Laterality Date  . BACK SURGERY  2012   Lumbar, [name]  . 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 CARDIOVERSION N/A 07/10/2013   Procedure:  CARDIOVERSION;  Surgeon: Lelon Perla, MD;  Location: [facility] ENDOSCOPY;  Service: Cardiovascular;  Laterality: N/A;  . CARDIOVERSION N/A 02/06/2016   Procedure: CARDIOVERSION;  Surgeon: Skeet Latch, MD;  Location: [place];  Service: Cardiovascular;  Laterality: N/A;  . CARPAL TUNNEL RELEASE  08/2006   Dr Doy Mince at [place]    . KNEE ARTHROSCOPY    . Lumbar Medial Branch Block  2017   left L2-5, S1 and right L2-5 and S1 medial branches    Family History  Problem Relation Age of Onset  . Lung cancer Mother   . Skin cancer Father     Melanoma  . Colon cancer Neg Hx   . Colon polyps Neg Hx    . Kidney disease Neg Hx   . Esophageal cancer Neg Hx   . Diabetes Neg Hx   . Gallbladder disease Neg Hx     No Known Allergies  Current Outpatient Prescriptions on File Prior to Visit  Medication Sig Dispense Refill  . amiodarone (PACERONE) 200 MG tablet Take 1 tablet (200 mg total) by mouth daily. 90 tablet 1  . furosemide (LASIX) 40 MG tablet TAKE ONE TABLET BY MOUTH TWICE DAILY 180 tablet 3  . pravastatin (PRAVACHOL) 40 MG tablet Take 1 tablet (40 mg total) by mouth every evening. 30 tablet 11   Current Facility-Administered Medications on File Prior to Visit  Medication Dose Route Frequency Provider Last Rate Last Dose  . iopamidol (ISOVUE-M) 41 % intrathecal injection 16 mL  16 mL Intrathecal Once Kristeen Miss, MD        BP 110/78 (BP Location: Left Arm, Patient Position: Sitting, Cuff Size: Normal)   Pulse 78   Temp 98.1 F (36.7 C) (Oral)   Ht 5\' 11"  (1.803 m)   Wt 257 lb (116.6 kg)   SpO2 96%   BMI 35.84 kg/m       Objective:   Physical Exam   General  Mental Status - Alert. General Appearance - Well groomed. Not in acute distress.  Skin Rashes- No Rashes.  HEENT Head- Normal. Ear Auditory Canal - Left- Normal. Right - Normal.Tympanic Membrane- Left- Normal. Right- Normal. Eye Sclera/Conjunctiva- Left- Normal. Right- Normal. Nose & Sinuses Nasal Mucosa- Left-  Boggy and Congested. Right-  Boggy and  Congested.Bilateral nol maxillary and frontal no  sinus pressure. Mouth & Throat Lips: Upper Lip- Normal: no dryness, cracking, pallor, cyanosis, or vesicular eruption. Lower Lip-Normal: no dryness, cracking, pallor, cyanosis or vesicular eruption. Buccal Mucosa- Bilateral- No Aphthous ulcers. Oropharynx- No Discharge or Erythema. +pnd. Tonsils: Characteristics- Bilateral- No Erythema or Congestion. Size/Enlargement- Bilateral- No enlargement. Discharge- bilateral-None.  Neck Neck- Supple. No Masses.   Chest and Lung Exam Auscultation: Breath  Sounds:-Clear even and unlabored.  Cardiovascular Auscultation:Rythm- Regular, rate and rhythm. Murmurs & Other Heart Sounds:Ausculatation of the heart reveal- No Murmurs.  Lymphatic Head & Neck General Head & Neck Lymphatics: Bilateral: Description- No Localized lymphadenopathy.      Assessment & Plan:  You appear to have early bronchitis following uri infection that has been present for one week. Rest hydrate and tylenol for fever. I am prescribing cough medicine hycodan, and doxycycline  antibiotic. For your nasal congestion use your flonase.  You should gradually get better. If not then notify us and would recommend a chest xray.  Follow up in 7-10 days or as needed

## 2016-11-26 NOTE — Progress Notes (Signed)
Pre visit review using our clinic review tool, if applicable. No additional management support is needed unless otherwise documented below in the visit note. 

## 2016-11-26 NOTE — Patient Instructions (Addendum)
You appear to have early bronchitis following uri infection that has been present for one week. Rest hydrate and tylenol for fever. I am prescribing cough medicine hycodan, and doxycycline antibiotic For your nasal congestion use your flonase.  You should gradually get better. If not then notify us and would recommend a chest xray.  Follow up in 7-10 days or as needed

## 2016-12-21 ENCOUNTER — Encounter: Payer: Self-pay | Admitting: Medical

## 2016-12-21 ENCOUNTER — Ambulatory Visit (HOSPITAL_BASED_OUTPATIENT_CLINIC_OR_DEPARTMENT_OTHER)
Admission: RE | Admit: 2016-12-21 | Discharge: 2016-12-21 | Disposition: A | Discharge status: Home / Self Care | Payer: Medicare Other | Source: Ambulatory Visit | Attending: Medical | Admitting: Medical

## 2016-12-21 ENCOUNTER — Telehealth: Payer: Self-pay | Admitting: Medical

## 2016-12-21 ENCOUNTER — Ambulatory Visit (INDEPENDENT_AMBULATORY_CARE_PROVIDER_SITE_OTHER): Payer: Medicare Other | Admitting: Medical

## 2016-12-21 ENCOUNTER — Telehealth: Payer: Self-pay | Admitting: Internal Medicine

## 2016-12-21 VITALS — BP 110/59 | HR 78 | Temp 98.4°F | Resp 18 | Ht 71.0 in | Wt 257.5 lb

## 2016-12-21 DIAGNOSIS — M48061 Spinal stenosis, lumbar region without neurogenic claudication: Secondary | ICD-10-CM | POA: Diagnosis not present

## 2016-12-21 DIAGNOSIS — R05 Cough: Secondary | ICD-10-CM

## 2016-12-21 DIAGNOSIS — Z981 Arthrodesis status: Secondary | ICD-10-CM | POA: Diagnosis not present

## 2016-12-21 DIAGNOSIS — I509 Heart failure, unspecified: Secondary | ICD-10-CM | POA: Diagnosis not present

## 2016-12-21 DIAGNOSIS — J42 Unspecified chronic bronchitis: Secondary | ICD-10-CM | POA: Diagnosis not present

## 2016-12-21 DIAGNOSIS — M5136 Other intervertebral disc degeneration, lumbar region: Secondary | ICD-10-CM | POA: Diagnosis not present

## 2016-12-21 DIAGNOSIS — I700 Atherosclerosis of aorta: Secondary | ICD-10-CM | POA: Insufficient documentation

## 2016-12-21 DIAGNOSIS — M5416 Radiculopathy, lumbar region: Secondary | ICD-10-CM | POA: Diagnosis not present

## 2016-12-21 DIAGNOSIS — J209 Acute bronchitis, unspecified: Secondary | ICD-10-CM

## 2016-12-21 DIAGNOSIS — M4726 Other spondylosis with radiculopathy, lumbar region: Secondary | ICD-10-CM | POA: Diagnosis not present

## 2016-12-21 DIAGNOSIS — R0602 Shortness of breath: Secondary | ICD-10-CM | POA: Diagnosis not present

## 2016-12-21 DIAGNOSIS — R059 Cough, unspecified: Secondary | ICD-10-CM

## 2016-12-21 MED ORDER — DOXYCYCLINE HYCLATE 100 MG PO TABS: 100.0000 mg | ORAL_TABLET | Freq: Two times a day (BID) | ORAL | 0 refills | Status: DC

## 2016-12-21 MED ORDER — HYDROCODONE-HOMATROPINE 5-1.5 MG/5ML PO SYRP: 5.0000 mL | ORAL_SOLUTION | Freq: Three times a day (TID) | ORAL | 0 refills | Status: DC | PRN

## 2016-12-21 MED ORDER — FLUTICASONE PROPIONATE 50 MCG/ACT NA SUSP: 2.0000 | Freq: Every day | NASAL | 1 refills | Status: DC

## 2016-12-21 MED ORDER — CEFTRIAXONE SODIUM 1 G IJ SOLR
1.0000 g | Freq: Once | INTRAMUSCULAR | Status: AC
  Administered 2016-12-21: 1 g via INTRAMUSCULAR

## 2016-12-21 NOTE — Patient Instructions (Addendum)
You appear to have bronchitis. Rest hydrate and tylenol for fever. I am prescribing cough medicine hycodan, and a antibiotic(but waiting for cxr result first). For your nasal congestion rx flonase.  Will get cxr today.(evaluate if you have walking pneumonia)  We gave rocephin 1 gram im today. Checked with pharmacy and no interaction found with amiodorone.    Follow up in 7-10 days or as needed

## 2016-12-21 NOTE — Addendum Note (Signed)
Addended by: Rockwell Germany on: 12/21/2016 08:55 AM   Modules accepted: Orders

## 2016-12-21 NOTE — Progress Notes (Signed)
Subjective:    Patient ID: Johnny Mitchell, male    DOB: 06/13/1941, 76 y.o.   MRN: [id number]  HPI   I  Pt in with head, nasal congestion, and chest congestion. Also with  Mild sore throat. Pt has productive cough. No fever, no chills or sweats. Can sleep ok with cough.   No sinus pressure but reports more chest congestion  No body aches.   Pt not convinced that he got better completely from last visit and feel like this may be continuation of his symptoms about 3 weeks ago.(in addition he was seen by [facility] early December as well)    Review of Systems  Constitutional: Negative for chills, fatigue and fever.  HENT: Positive for congestion. Negative for postnasal drip, rhinorrhea, sinus pain, sinus pressure and sore throat.   Respiratory: Positive for cough. Negative for chest tightness, shortness of breath and wheezing.   Cardiovascular: Negative for chest pain and palpitations.  Gastrointestinal: Negative for abdominal pain.  Musculoskeletal: Negative for back pain and myalgias.  Skin: Negative for rash.  Neurological: Negative for dizziness, syncope, weakness, numbness and headaches.  Hematological: Negative for adenopathy. Does not bruise/bleed easily.  Psychiatric/Behavioral: Negative for behavioral problems and decreased concentration.      Past Medical History:  Diagnosis Date  . Adenomatous colon polyp 2003   max size 43mm  . Amyloidosis    seen at [facility]  . Atrial fibrillation ([place])   . Benign neoplasm of colon   . Cancer (HCC)    amyloidosis  . Cerebrovascular disease, unspecified   . Chronic venous insufficiency 2016  . Coronary atherosclerosis of unspecified type of vessel, native or graft    Non obstructive  . Cough, persistent 09/29/2015  . DDD (degenerative disc disease), lumbar    Dr. Katherine Roan  . Degenerative disc disease   . Diverticulosis of colon (without mention of hemorrhage)   . Edema 08/29/2013  . Hemangioma   . Hx of  cardiovascular stress test    Adenosine Myoview (02/2014): Apical thinning, no ischemia, EF 48%; low risk.  Marland Kitchen Hypertension   . LBBB (left bundle branch block)   . Low back pain    chronic, seeing pain management  . 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 OSA (obstructive sleep apnea)   . Pure hypercholesterolemia   . Scrotal pain 10/23/2014  . Shortness of breath dyspnea   . 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 Traumatic open wound of left lower leg with delayed healing    Seen at [facility] on 05/29/2015  . Unspecified hemorrhoids without mention of complication   . Wide-complex tachycardia ([place]) 01/2011   Felt likely be SVT with abbarancy     Social History   Social History  . Marital status: Married    Spouse name: N/A  . Number of children: 2  . Years of education: N/A   Occupational History  . PRESIDENT [employer]   Social History Main Topics  . Smoking status: Never Smoker  . Smokeless tobacco: Never Used  . Alcohol use 0.0 oz/week     Comment: Occassionally  . Drug use: No  . Sexual activity: No   Other Topics Concern  . Not on file   Social History Narrative   Lives with wife.      Past Surgical History:  Procedure Laterality Date  .  BACK SURGERY  2012   Lumbar, [name]  . 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 CARDIOVERSION N/A 07/10/2013   Procedure: CARDIOVERSION;  Surgeon: Lelon Perla, MD;  Location[facility] ENDOSCOPY;  Service: Cardiovascular;  Laterality: N/A;  . CARDIOVERSION N/A 02/06/2016   Procedure: CARDIOVERSION;  Surgeon: Skeet Latch, MD;  Location: [place];  Service: Cardiovascular;  Laterality: N/A;  . CARPAL TUNNEL RELEASE  08/2006   Dr Doy Mince at [place]    . KNEE ARTHROSCOPY    . Lumbar Medial Branch Block  2017   left L2-5, S1 and right L2-5 and S1 medial  branches    Family History  Problem Relation Age of Onset  . Lung cancer Mother   . Skin cancer Father     Melanoma  . Colon cancer Neg Hx   . Colon polyps Neg Hx   . Kidney disease Neg Hx   . Esophageal cancer Neg Hx   . Diabetes Neg Hx   . Gallbladder disease Neg Hx     No Known Allergies  Current Outpatient Prescriptions on File Prior to Visit  Medication Sig Dispense Refill  . amiodarone (PACERONE) 200 MG tablet Take 1 tablet (200 mg total) by mouth daily. 90 tablet 1  . furosemide (LASIX) 40 MG tablet TAKE ONE TABLET BY MOUTH TWICE DAILY 180 tablet 3  . pravastatin (PRAVACHOL) 40 MG tablet Take 1 tablet (40 mg total) by mouth every evening. 30 tablet 11  . HYDROcodone-homatropine (HYCODAN) 5-1.5 MG/5ML syrup Take 5 mLs by mouth every 8 (eight) hours as needed for cough. (Patient not taking: Reported on 12/21/2016) 120 mL 0   Current Facility-Administered Medications on File Prior to Visit  Medication Dose Route Frequency Provider Last Rate Last Dose  . iopamidol (ISOVUE-M) 41 % intrathecal injection 16 mL  16 mL Intrathecal Once Kristeen Miss, MD        BP (!) 110/59 (BP Location: Right Arm, Patient Position: Sitting, Cuff Size: Large)   Pulse 78   Temp 98.4 F (36.9 C) (Oral)   Resp 18   Ht 5\' 11"  (1.803 m)   Wt 257 lb 8 oz (116.8 kg)   SpO2 100%   BMI 35.91 kg/m       Objective:   Physical Exam  General  Mental Status - Alert. General Appearance - Well groomed. Not in acute distress(sounds nasal congested)  Skin Rashes- No Rashes.  HEENT Head- Normal. Ear Auditory Canal - Left- Normal. Right - Normal.Tympanic Membrane- Left- Normal. Right- Normal. Eye Sclera/Conjunctiva- Left- Normal. Right- Normal. Nose & Sinuses Nasal Mucosa- Left-  Boggy and Congested. Right-  Boggy and  Congested.Bilateral no  maxillary and no frontal sinus pressure. Mouth & Throat Lips: Upper Lip- Normal: no dryness, cracking, pallor, cyanosis, or vesicular eruption. Lower  Lip-Normal: no dryness, cracking, pallor, cyanosis or vesicular eruption. Buccal Mucosa- Bilateral- No Aphthous ulcers. Oropharynx- No Discharge or Erythema. Tonsils: Characteristics- Bilateral- No Erythema or Congestion. Size/Enlargement- Bilateral- No enlargement. Discharge- bilateral-None.  Neck Neck- Supple. No Masses.   Chest and Lung Exam Auscultation: Breath Sounds:-Clear even and unlabored.  Cardiovascular Auscultation:Rythm- Regular, rate and rhythm. Murmurs & Other Heart Sounds:Ausculatation of the heart reveal- No Murmurs.  Lymphatic Head & Neck General Head & Neck Lymphatics: Bilateral: Description- No Localized lymphadenopathy.       Assessment & Plan:  You appear to have bronchitis. Rest hydrate and tylenol for fever. I am prescribing cough medicine hycodan, and a antibiotic(but  waiting for cxr result first). For your nasal congestion rx flonase.  Will get cxr today.(evaluate if you have walking pneumonia)  We gave rocephin 1 gram im today. Checked with pharmacy and no interaction found with amiodorone.  Follow up in 7-10 days or as needed   Eli Pattillo, Percell Miller, [employer]

## 2016-12-21 NOTE — Telephone Encounter (Signed)
Pt says that he was seen today by Percell Miller. Pt says that if he has pneumonia provider was going to send in Rx for HYDROcodone. Looks like In chart attempt was made however pt states that pharmacy never received Rx. Also, pt would like his x-ray results   Please assist further.     [place], [place]: [phone]

## 2016-12-21 NOTE — Telephone Encounter (Signed)
Sent in doxy antibiotic and ordered stat bnp and cmp which he should go tomorrow as I explained to him.

## 2016-12-21 NOTE — Progress Notes (Signed)
Pre visit review using our clinic review tool, if applicable. No additional management support is needed unless otherwise documented below in the visit note/SLS  

## 2016-12-21 NOTE — Telephone Encounter (Signed)
Provider has not signed off on Imaging results; therefore, since it is provider's half day and he has left for the day, it will be tomorrow morning before results can be addressed with patient. Also, Hydrocodone was printed, as it is a Control class II drug; it cannot be sent to pharmacy [must be taken into pharmacy in person. LMOM with contact name and number [for return call, if needed] RE: this matter and that we will call pt back tomorrow with full imaging results [did inform that No pneumonia was seen]/SLS 01/09

## 2016-12-21 NOTE — Telephone Encounter (Signed)
Pt seen today. He might have mild acute flare of chf. I as looking at pt chart and former echo did not show ejection fraction that I could see.  Then on further review of chart saw he had MR cardiac morphology study ordered. I did not see result? Should he have gotten that. Also when is pt follow up with Dr. [name]. Will you call his office and see when follow up is and does he have any imaging type studies that are pending/pt may not have followed through with.   Will you let me know what Dr. [name] office states.

## 2016-12-22 ENCOUNTER — Other Ambulatory Visit (INDEPENDENT_AMBULATORY_CARE_PROVIDER_SITE_OTHER): Payer: Medicare Other

## 2016-12-22 ENCOUNTER — Telehealth: Payer: Self-pay | Admitting: Medical

## 2016-12-22 DIAGNOSIS — I509 Heart failure, unspecified: Secondary | ICD-10-CM

## 2016-12-22 LAB — COMPREHENSIVE METABOLIC PANEL
ALBUMIN: 3.3 g/dL — AB (ref 3.5–5.2)
ALT: 12 U/L (ref 0–53)
AST: 14 U/L (ref 0–37)
Alkaline Phosphatase: 90 U/L (ref 39–117)
BUN: 30 mg/dL — AB (ref 6–23)
CALCIUM: 8.6 mg/dL (ref 8.4–10.5)
CO2: 28 mEq/L (ref 19–32)
Chloride: 105 mEq/L (ref 96–112)
Creatinine, Ser: 1.66 mg/dL — ABNORMAL HIGH (ref 0.40–1.50)
GFR: 42.83 mL/min — ABNORMAL LOW (ref >60.00)
Glucose, Bld: 132 mg/dL — ABNORMAL HIGH (ref 70–99)
POTASSIUM: 3.8 meq/L (ref 3.5–5.1)
Sodium: 141 mEq/L (ref 135–145)
Total Bilirubin: 0.6 mg/dL (ref 0.2–1.2)
Total Protein: 6.6 g/dL (ref 6.0–8.3)

## 2016-12-22 LAB — BRAIN NATRIURETIC PEPTIDE: Pro B Natriuretic peptide (BNP): 287 pg/mL — ABNORMAL HIGH (ref 0.0–100.0)

## 2016-12-22 NOTE — Telephone Encounter (Signed)
Will you notify pt that he has appointment scheduled and give date. Depending on how he is doing I might try to get him in sooner.

## 2016-12-22 NOTE — Telephone Encounter (Signed)
Patient scheduled for 01/06/2017, not scheduled for MR yet

## 2016-12-22 NOTE — Telephone Encounter (Signed)
Still needs the labs that I ordered for him. Please notify him to get done today.

## 2016-12-22 NOTE — Telephone Encounter (Signed)
Patient aware of appt, wanted to let you know he did come in this morning for lab work

## 2016-12-22 NOTE — Telephone Encounter (Signed)
Patient already came in this morning

## 2016-12-23 NOTE — Telephone Encounter (Signed)
Spoke w/ Pt regarding results.

## 2016-12-28 ENCOUNTER — Ambulatory Visit: Payer: Medicare Other

## 2016-12-28 DIAGNOSIS — C9000 Multiple myeloma not having achieved remission: Secondary | ICD-10-CM | POA: Diagnosis not present

## 2016-12-29 NOTE — Progress Notes (Signed)
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 LHC 10/2009: pLAD 20%, dLAD 30%, mOM2 70-80% (unchanged compared to previous), pRCA 20%, dRCA 20%.  Admitted in February of 2012 with a wide complex tachycardia. Seen by EP and rhythm was most likely felt to be SVT with aberrancy. It was felt that if symptoms recur ablation would be warranted. Cardiac MRI (09/05/13): Moderate LVE, focal septal hypertrophy, EF 51%, global HK, mild RVE, normal RVSF, no evidence of cardiac amyloid. Nuclear study in March of 2015 showed apical thinning but no ischemia. Ejection fraction was 48%. Echo 11/15 showed EF 123456, grade 2 diastolic dysfunction, mild LAE and mildly dilated aortic root. Last cardioversion for atrial fibrillation was on 02/06/2016. Also had syncopal episode. Echocardiogram limited repeated February 2017 and showed normal LV systolic function. Monitor 3/17 showed sinus with first degree AV block. Pt requested DC of anticoagulation at last ov as he had fallen and developed hemarthrosis. Since last seen, patient notes increased dyspnea on exertion but he is only taking Lasix 40 mg daily. He denies orthopnea or PND. He does have some pedal edema. No chest pain or syncope.  Current Outpatient Prescriptions  Medication Sig Dispense Refill  . amiodarone (PACERONE) 200 MG tablet Take 1 tablet (200 mg total) by mouth daily. 90 tablet 1  . fluticasone (FLONASE) 50 MCG/ACT nasal spray Place 2 sprays into both nostrils daily. 16 g 1  . furosemide (LASIX) 40 MG tablet TAKE ONE TABLET BY MOUTH TWICE DAILY 180 tablet 3  . guaiFENesin-codeine (ROBITUSSIN AC) 100-10 MG/5ML syrup Take 5 mLs by mouth 2 (two) times daily as needed for cough.    Marland Kitchen HYDROcodone-homatropine (HYCODAN) 5-1.5 MG/5ML syrup Take 5 mLs by mouth every 8 (eight) hours as needed for cough. 180 mL 0  . pravastatin (PRAVACHOL) 40 MG tablet Take 1 tablet (40 mg total) by mouth every  evening. 30 tablet 11   No current facility-administered medications for this visit.    Facility-Administered Medications Ordered in Other Visits  Medication Dose Route Frequency Provider Last Rate Last Dose  . iopamidol (ISOVUE-M) 41 % intrathecal injection 16 mL  16 mL Intrathecal Once Kristeen Miss, MD         Past Medical History:  Diagnosis Date  . Adenomatous colon polyp 2003   max size 64mm  . Amyloidosis    seen at [facility]  . Atrial fibrillation ([place])   . Benign neoplasm of colon   . Cancer (HCC)    amyloidosis  . Cerebrovascular disease, unspecified   . Chronic venous insufficiency 2016  . Coronary atherosclerosis of unspecified type of vessel, native or graft    Non obstructive  . Cough, persistent 09/29/2015  . DDD (degenerative disc disease), lumbar    Dr. Katherine Roan  . Degenerative disc disease   . Diverticulosis of colon (without mention of hemorrhage)   . Edema 08/29/2013  . Hemangioma   . Hx of cardiovascular stress test    Adenosine Myoview (02/2014): Apical thinning, no ischemia, EF 48%; low risk.  Marland Kitchen Hypertension   . LBBB (left bundle branch block)   . Low back pain    chronic, seeing pain management  . 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 OSA (obstructive sleep apnea)   . Pure hypercholesterolemia   . Scrotal pain 10/23/2014  . Shortness of breath dyspnea   .  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 Traumatic open wound of left lower leg with delayed healing    Seen at [facility] on 05/29/2015  . Unspecified hemorrhoids without mention of complication   . 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 BACK SURGERY  2012   Lumbar, [name]  . 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 CARDIOVERSION N/A 07/10/2013   Procedure: CARDIOVERSION;  Surgeon: Lelon Perla, MD;   Location: [facility] ENDOSCOPY;  Service: Cardiovascular;  Laterality: N/A;  . CARDIOVERSION N/A 02/06/2016   Procedure: CARDIOVERSION;  Surgeon: Skeet Latch, MD;  Location: [place];  Service: Cardiovascular;  Laterality: N/A;  . CARPAL TUNNEL RELEASE  08/2006   Dr Doy Mince at [place]    . KNEE ARTHROSCOPY    . Lumbar Medial Branch Block  2017   left L2-5, S1 and right L2-5 and S1 medial branches    Social History   Social History  . Marital status: Married    Spouse name: N/A  . Number of children: 2  . Years of education: N/A   Occupational History  . PRESIDENT [employer]   Social History Main Topics  . Smoking status: Never Smoker  . Smokeless tobacco: Never Used  . Alcohol use 0.0 oz/week     Comment: Occassionally  . Drug use: No  . Sexual activity: No   Other Topics Concern  . Not on file   Social History Narrative   Lives with wife.      Family History  Problem Relation Age of Onset  . Lung cancer Mother   . Skin cancer Father     Melanoma  . Colon cancer Neg Hx   . Colon polyps Neg Hx   . Kidney disease Neg Hx   . Esophageal cancer Neg Hx   . Diabetes Neg Hx   . Gallbladder disease Neg Hx     ROS: Back pain but no fevers or chills, productive cough, hemoptysis, dysphasia, odynophagia, melena, hematochezia, dysuria, hematuria, rash, seizure activity, orthopnea, PND, claudication. Remaining systems are negative.  Physical Exam: Well-developed well-nourished in no acute distress.  Skin is warm and dry.  HEENT is normal.  Neck is supple.  Chest is clear to auscultation with normal expansion.  Cardiovascular exam is regular rate and rhythm.  Abdominal exam nontender or distended. No masses palpated. Extremities show 1+ edema. neuro grossly intact  ECG-Sinus rhythm with first-degree AV block. Left bundle branch block.  A/P  1 Paroxysmal atrial fibrillation-patient in sinus  rhythm today. Continue amiodarone. We discussed anticoagulation again today. He declines further anticoagulation and understands the higher risk of stroke. Add aspirin 81 mg daily.  2 supraventricular tachycardia-continue amiodarone.  3 hyperlipidemia-continue statin.  4 hypertension-blood pressure is controlled. Continue present medications.  5 coronary artery disease-continue statin. Add ASA given he does not want anticoagulation.  6 amyloidosis  7 chronic diastolic congestive heart failure-patient is volume overloaded on examination. Increase Lasix back to 40 mg twice a day. I discussed the importance of compliance. Check potassium and renal function in 1 week. We discussed the importance of fluid restriction and low sodium diet.  [name], MD

## 2017-01-06 ENCOUNTER — Ambulatory Visit (INDEPENDENT_AMBULATORY_CARE_PROVIDER_SITE_OTHER): Payer: Medicare Other | Admitting: Cardiology

## 2017-01-06 ENCOUNTER — Encounter: Payer: Self-pay | Admitting: Cardiology

## 2017-01-06 VITALS — BP 142/79 | HR 75 | Ht 71.0 in | Wt 249.6 lb

## 2017-01-06 DIAGNOSIS — R0609 Other forms of dyspnea: Secondary | ICD-10-CM

## 2017-01-06 DIAGNOSIS — I5032 Chronic diastolic (congestive) heart failure: Secondary | ICD-10-CM

## 2017-01-06 DIAGNOSIS — I480 Paroxysmal atrial fibrillation: Secondary | ICD-10-CM | POA: Diagnosis not present

## 2017-01-06 DIAGNOSIS — N183 Chronic kidney disease, stage 3 unspecified: Secondary | ICD-10-CM

## 2017-01-06 MED ORDER — ASPIRIN EC 81 MG PO TBEC: 81.0000 mg | DELAYED_RELEASE_TABLET | Freq: Every day | ORAL | 3 refills | Status: DC

## 2017-01-06 MED ORDER — FUROSEMIDE 40 MG PO TABS: 40.0000 mg | ORAL_TABLET | Freq: Two times a day (BID) | ORAL | 3 refills | Status: DC

## 2017-01-06 NOTE — Patient Instructions (Signed)
Medication Instructions:   INCREASE FUROSEMIDE TO 40 MG TWICE DAILY  START ASPIRIN 81 MG ONCE DAILY  Labwork:  Your physician recommends that you return for lab work in: [place]  Testing/Procedures:  Your physician has requested that you have an echocardiogram. Echocardiography is a painless test that uses sound waves to create images of your heart. It provides your doctor with information about the size and shape of your heart and how well your heart's chambers and valves are working. This procedure takes approximately one hour. There are no restrictions for this procedure.    Follow-Up:  Your physician wants you to follow-up in: 3 MONTHS WITH DR [name] IN [facility] You will receive a reminder letter in the mail two months in advance. If you don't receive a letter, please call our office to schedule the follow-up appointment.   If you need a refill on your cardiac medications before your next appointment, please call your pharmacy.

## 2017-01-12 ENCOUNTER — Ambulatory Visit (INDEPENDENT_AMBULATORY_CARE_PROVIDER_SITE_OTHER): Payer: Medicare Other | Admitting: Internal Medicine

## 2017-01-12 ENCOUNTER — Encounter: Payer: Self-pay | Admitting: Internal Medicine

## 2017-01-12 VITALS — BP 134/74 | HR 74 | Temp 97.8°F | Resp 14 | Ht 71.0 in | Wt 239.1 lb

## 2017-01-12 DIAGNOSIS — I480 Paroxysmal atrial fibrillation: Secondary | ICD-10-CM

## 2017-01-12 DIAGNOSIS — G8929 Other chronic pain: Secondary | ICD-10-CM

## 2017-01-12 DIAGNOSIS — I509 Heart failure, unspecified: Secondary | ICD-10-CM

## 2017-01-12 DIAGNOSIS — M545 Low back pain: Secondary | ICD-10-CM | POA: Diagnosis not present

## 2017-01-12 DIAGNOSIS — Z23 Encounter for immunization: Secondary | ICD-10-CM | POA: Diagnosis not present

## 2017-01-12 DIAGNOSIS — Z0000 Encounter for general adult medical examination without abnormal findings: Secondary | ICD-10-CM | POA: Insufficient documentation

## 2017-01-12 MED ORDER — DULOXETINE HCL 30 MG PO CPEP: 30.0000 mg | ORAL_CAPSULE | Freq: Every day | ORAL | 1 refills | Status: DC

## 2017-01-12 NOTE — Assessment & Plan Note (Addendum)
Td - 12-2016;   pnm 23 - in 6 months  prevnar 12-2016; had a flu shot. Shingles shot-- avoid, h/o MM   CCS: History of polyps 2003, colonoscopy 2010, no polyps, Dr. Carlean Purl rec to consider Cscope age 76 depending on general status- discuss pro-cons, I personally rec not to proceed , he agrees, will call if changes his mind Last PSA 2010, normal, no further screening indicated

## 2017-01-12 NOTE — Patient Instructions (Addendum)
Start taking Cymbalta 1 tablet daily  Come back in 4 weeks, fasting.   Schedule Medicare wellness with one of our nurses

## 2017-01-12 NOTE — Progress Notes (Signed)
Subjective:    Patient ID: DELVECCHIO WOLBERS, male    DOB: 16-Jan-1941, 76 y.o.   MRN: [id number]  DOS:  01/12/2017 Type of visit - description : Routine checkup, here w/  Wife Interval history: Since the last visit, he continue with back pain despite 2 recent back injections. I noted patient to be quite frustrated and sometimes angry about his pain, this was openly discuss with him and  his wife. There is some degree of anxiety and depression as well. Went to see cardiology, medications were adjusted, echocardiogram is pending Anticoagulation: Self discontinue Xarelto, currently on aspirin    Review of Systems Constitutional: No fever. No chills. No unexplained wt changes. No unusual sweats  HEENT: No dental problems, no ear discharge, no facial swelling, no voice changes. No eye discharge, no eye  redness , no  intolerance to light   Respiratory: Difficulty breathing at baseline  Cardiovascular: No CP, + swelling, just saw cardiology , no  Palpitations  GI: no nausea, no vomiting, no diarrhea , no  abdominal pain.  No blood in the stools. No dysphagia, no odynophagia    Endocrine: No polyphagia, no polyuria , no polydipsia  GU: No dysuria, gross hematuria, difficulty urinating. No urinary urgency, no frequency.  Musculoskeletal: see comments above  Skin: No change in the color of the skin, palor , no  Rash  Allergic, immunologic: No environmental allergies , no  food allergies  Neurological:  Continue w/ imbalance, x years; no  syncope. No headaches. No diplopia, no slurred, no slurred speech, no motor deficits, no facial  Numbness  Hematological: No enlarged lymph nodes, no easy bruising , no unusual bleedings  Psychiatry: No suicidal ideas, no hallucinations, no beavior problems, no confusion.     Past Medical History:  Diagnosis Date  . Adenomatous colon polyp 2003   max size 76mm  . Amyloidosis    seen at [facility]  . Atrial fibrillation ([place])   . Benign  neoplasm of colon   . Cancer (HCC)    amyloidosis  . Cerebrovascular disease, unspecified   . Chronic venous insufficiency 2016  . Coronary atherosclerosis of unspecified type of vessel, native or graft    Non obstructive  . Cough, persistent 09/29/2015  . DDD (degenerative disc disease), lumbar    Dr. Katherine Roan  . Degenerative disc disease   . Diverticulosis of colon (without mention of hemorrhage)   . Edema 08/29/2013  . Hemangioma   . Hx of cardiovascular stress test    Adenosine Myoview (02/2014): Apical thinning, no ischemia, EF 48%; low risk.  Marland Kitchen Hypertension   . LBBB (left bundle branch block)   . Low back pain    chronic, seeing pain management  . 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 OSA (obstructive sleep apnea)   . Pure hypercholesterolemia   . Scrotal pain 10/23/2014  . Shortness of breath dyspnea   . 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 Traumatic open wound of left lower leg with delayed healing    Seen at [facility] on 05/29/2015  . Unspecified hemorrhoids without mention of complication   . Wide-complex tachycardia ([place]) 01/2011   Felt likely be SVT with abbarancy    Past Surgical History:  Procedure Laterality Date  . BACK SURGERY  2012   Lumbar, [name]  . 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 CARDIOVERSION N/A 07/10/2013  Procedure: CARDIOVERSION;  Surgeon: Lelon Perla, MD;  Location: [facility] ENDOSCOPY;  Service: Cardiovascular;  Laterality: N/A;  . CARDIOVERSION N/A 02/06/2016   Procedure: CARDIOVERSION;  Surgeon: Skeet Latch, MD;  Location: [place];  Service: Cardiovascular;  Laterality: N/A;  . CARPAL TUNNEL RELEASE  08/2006   Dr Doy Mince at [place]    . KNEE ARTHROSCOPY    . Lumbar Medial Branch Block  2017   left L2-5, S1 and right L2-5 and S1 medial branches    Social History   Social History  . Marital  status: Married    Spouse name: N/A  . Number of children: 2  . Years of education: N/A   Occupational History  . PRESIDENT [employer]   Social History Main Topics  . Smoking status: Never Smoker  . Smokeless tobacco: Never Used  . Alcohol use 0.0 oz/week     Comment: Occassionally  . Drug use: No  . Sexual activity: No   Other Topics Concern  . Not on file   Social History Narrative   Lives with wife.        Allergies as of 01/12/2017   No Known Allergies     Medication List       Accurate as of 01/12/17  7:21 PM. Always use your most recent med list.          amiodarone 200 MG tablet Commonly known as:  PACERONE Take 1 tablet (200 mg total) by mouth daily.   aspirin EC 81 MG tablet Take 1 tablet (81 mg total) by mouth daily.   DULoxetine 30 MG capsule Commonly known as:  CYMBALTA Take 1 capsule (30 mg total) by mouth daily.   fluticasone 50 MCG/ACT nasal spray Commonly known as:  FLONASE Place 2 sprays into both nostrils daily.   furosemide 40 MG tablet Commonly known as:  LASIX Take 1 tablet (40 mg total) by mouth 2 (two) times daily.   pravastatin 40 MG tablet Commonly known as:  PRAVACHOL Take 1 tablet (40 mg total) by mouth every evening.   Turmeric Curcumin 500 MG Caps Take 1,000 mg by mouth daily.          Objective:   Physical Exam BP 134/74 (BP Location: Left Arm, Patient Position: Sitting, Cuff Size: Normal)   Pulse 74   Temp 97.8 F (36.6 C) (Oral)   Resp 14   Ht 5\' 11"  (1.803 m)   Wt 239 lb 2 oz (108.5 kg)   SpO2 97%   BMI 33.35 kg/m   General:   Well developed, well nourished . NAD.  Neck: No  thyromegaly  HEENT:  Normocephalic . Face symmetric, atraumatic Lungs:  CTA B Normal respiratory effort, no intercostal retractions, no accessory muscle use. Heart: Seems regular today RRR,  no murmur.  +/+++ pretibial edema bilaterally  Abdomen:  Not distended, soft, non-tender. No rebound  or rigidity.   Skin: Exposed areas without rash. Not pale. Not jaundice Neurologic:  alert & oriented X3.  Speech normal, gait : Walks with some difficulty, unassisted Strength symmetric and appropriate for age.  Psych: Cognition and judgment appear intact.  Cooperative with normal attention span and concentration.  Behavior appropriate. Seems somewhat angry and irritable    Assessment & Plan:   Assessment  (transfer from Dr Lenna Gilford 2015) HTN Creat increased from 1.4 to ~1.8 by the end of 2016  CV: --CAD  --Chronic diastolic CHF --  h/ oCVA  --Wide-complex tachycardia 2012, felt to be SVT with aberrancy  --P . A.  Fibrillation: admited 01-2016 (syncope), s/p DCCV>> converted --Venous insufficiency, edema Chronic DOE since ~ 2011 DJD-- Back pain, chronic. saw Dr.Elsner 2015, saw Dr. Katherine Roan at [facility] 2016, after the visit and and review of the MRI from 2014 ---> they did not recommend surgery. Saw pain mngmt Dr Earley Favor, 2 injections  ~ 02-2016, rx ultram (per pt): no help. Saw Dr Ellene Route again 09-2016 : they discussed a myelogram. Previously oxycodone caused sedation  Amyloidosis-- f/u [facility]  Sleep apnea - on CPAP H/o persistent cough  PLAN: HTN: Seems well-controlled Cardiovascular: Paroxysmal atrial fibrillation, CHF: Saw cards few days ago, lasix was increased, they're planning to check a BMP soon. Patient self d/c anticoagulation, he stated "the heart doctor said it was okay". I make him noticed that the heart doctor did not say it was okay, rather the patient declined anticoagulation due to problems with easy bruising so he was rec ASA (see cards note). Chronic back pain: Reports 2 recent local injections without help. He asked me to send him for another opinion; chart is reviewed, he had a number of opinions. I recommend him to see pain management and/or Dr Ellene Route,  states he will. Additionally, I think chronic pain is causing anxiety, depression and irritability, the wife  agrees. I offered him Cymbalta and he agreed to try, no med interactions noted.. Plan: Cymbalta 30 mg, assess in 4 weeks Multiple melanoma: Follow-up at [facility], hemoglobin 07/19/2016 9.2. No recent hemoglobin in our system. RTC 4 weeks, at that time I'll ask him to come back fasting, will check a FLP, CBC.  Today, I spent more than 40   min with the patient: >50% of the time counseling regards His back pain, reviewing the chart, explained him that another opinion regards back pain was not necessarily the way to go. Multiple questions answered to the best of my ability. Discussed with the patient and his wife that chronic pain can lead to depression, anxiety and irritability. Explaining why Cymbalta would be a good option for him, noting that such medication is not" painkiller".

## 2017-01-12 NOTE — Assessment & Plan Note (Signed)
HTN: Seems well-controlled Cardiovascular: Paroxysmal atrial fibrillation, CHF: Saw cards few days ago, lasix was increased, they're planning to check a BMP soon. Patient self d/c anticoagulation, he stated "the heart doctor said it was okay". I make him noticed that the heart doctor did not say it was okay, rather the patient declined anticoagulation due to problems with easy bruising so he was rec ASA (see cards note). Chronic back pain: Reports 2 recent local injections without help. He asked me to send him for another opinion; chart is reviewed, he had a number of opinions. I recommend him to see pain management and/or Dr Ellene Route,  states he will. Additionally, I think chronic pain is causing anxiety, depression and irritability, the wife agrees. I offered him Cymbalta and he agreed to try, no med interactions noted.. Plan: Cymbalta 30 mg, assess in 4 weeks Multiple melanoma: Follow-up at [facility], hemoglobin 07/19/2016 9.2. No recent hemoglobin in our system. RTC 4 weeks, at that time I'll ask him to come back fasting, will check a FLP, CBC.

## 2017-01-12 NOTE — Progress Notes (Signed)
Pre visit review using our clinic review tool, if applicable. No additional management support is needed unless otherwise documented below in the visit note. 

## 2017-01-19 ENCOUNTER — Ambulatory Visit (HOSPITAL_BASED_OUTPATIENT_CLINIC_OR_DEPARTMENT_OTHER)
Admission: RE | Admit: 2017-01-19 | Discharge: 2017-01-19 | Disposition: A | Discharge status: Home / Self Care | Payer: Medicare Other | Source: Ambulatory Visit | Attending: Cardiology | Admitting: Cardiology

## 2017-01-19 DIAGNOSIS — Z6833 Body mass index (BMI) 33.0-33.9, adult: Secondary | ICD-10-CM | POA: Diagnosis not present

## 2017-01-19 DIAGNOSIS — E7800 Pure hypercholesterolemia, unspecified: Secondary | ICD-10-CM | POA: Diagnosis not present

## 2017-01-19 DIAGNOSIS — R0609 Other forms of dyspnea: Secondary | ICD-10-CM | POA: Diagnosis not present

## 2017-01-19 DIAGNOSIS — I110 Hypertensive heart disease with heart failure: Secondary | ICD-10-CM | POA: Diagnosis not present

## 2017-01-19 DIAGNOSIS — I071 Rheumatic tricuspid insufficiency: Secondary | ICD-10-CM | POA: Insufficient documentation

## 2017-01-19 DIAGNOSIS — E669 Obesity, unspecified: Secondary | ICD-10-CM | POA: Insufficient documentation

## 2017-01-19 DIAGNOSIS — I509 Heart failure, unspecified: Secondary | ICD-10-CM | POA: Insufficient documentation

## 2017-01-19 DIAGNOSIS — I447 Left bundle-branch block, unspecified: Secondary | ICD-10-CM | POA: Diagnosis not present

## 2017-01-19 NOTE — Progress Notes (Signed)
  Echocardiogram 2D Echocardiogram has been performed.  Tresa Res 01/19/2017, 12:10 PM

## 2017-01-24 DIAGNOSIS — N183 Chronic kidney disease, stage 3 (moderate): Secondary | ICD-10-CM | POA: Diagnosis not present

## 2017-01-24 LAB — BASIC METABOLIC PANEL
BUN: 36 mg/dL — ABNORMAL HIGH (ref 7–25)
CALCIUM: 8.7 mg/dL (ref 8.6–10.3)
CHLORIDE: 102 mmol/L (ref 98–110)
CO2: 30 mmol/L (ref 20–31)
Creat: 1.84 mg/dL — ABNORMAL HIGH (ref 0.70–1.18)
Glucose, Bld: 117 mg/dL — ABNORMAL HIGH (ref 65–99)
Potassium: 4.7 mmol/L (ref 3.5–5.3)
SODIUM: 140 mmol/L (ref 135–146)

## 2017-02-03 ENCOUNTER — Telehealth: Payer: Self-pay | Admitting: *Deleted

## 2017-02-03 NOTE — Telephone Encounter (Signed)
Pt declined

## 2017-02-09 ENCOUNTER — Ambulatory Visit: Payer: Medicare Other | Admitting: Internal Medicine

## 2017-02-09 DIAGNOSIS — Z0289 Encounter for other administrative examinations: Secondary | ICD-10-CM

## 2017-02-24 DIAGNOSIS — M5136 Other intervertebral disc degeneration, lumbar region: Secondary | ICD-10-CM | POA: Diagnosis not present

## 2017-02-24 DIAGNOSIS — M47816 Spondylosis without myelopathy or radiculopathy, lumbar region: Secondary | ICD-10-CM | POA: Diagnosis not present

## 2017-02-24 DIAGNOSIS — G8929 Other chronic pain: Secondary | ICD-10-CM | POA: Diagnosis not present

## 2017-03-14 ENCOUNTER — Telehealth: Payer: Self-pay | Admitting: Cardiology

## 2017-03-14 NOTE — Telephone Encounter (Signed)
New Message   Pt c/o Shortness Of Breath: STAT if SOB developed within the last 24 hours or pt is noticeably SOB on the phone  1. Are you currently SOB (can you hear that pt is SOB on the phone)? No only exercising   2. How long have you been experiencing SOB? All the time  3. Are you SOB when sitting or when up moving around? Moving around  4. Are you currently experiencing any other symptoms? no

## 2017-03-14 NOTE — Telephone Encounter (Signed)
lmtcb

## 2017-03-16 NOTE — Telephone Encounter (Signed)
Pt of Dr. [name]  He notes SOB for a number of weeks - feels it is progressively worse. He states concern that this is brought on with "strenuous exertion" (usually doing work around house or walking up a flight of steps. Pt denies dizziness, lightheadedness, chest pain/pressure, jaw, arm or neck pain. Taking lasix as prescribed.  Denies missed doses. He denies weight gain, fluid retention, orthopnea. Does not wish to take extra doses of this but aware to call if he notes CHF symptoms. Declined APP visit this week --- OK to see Dr. [name] next available, but did not wish to wait until July. States when he initially called to schedule they gave him July as next available.  Appts available on schedule next week for DoD, 24 hr, 7 day, etc. Pt added for 4/9 at 11:40am. I have asked him to call back if his symptoms are worse in interim. He was agreeable to this plan, & voiced thanks for call.

## 2017-03-21 ENCOUNTER — Encounter: Payer: Self-pay | Admitting: Cardiology

## 2017-03-21 ENCOUNTER — Ambulatory Visit (INDEPENDENT_AMBULATORY_CARE_PROVIDER_SITE_OTHER): Payer: Medicare Other | Admitting: Cardiology

## 2017-03-21 VITALS — BP 122/60 | HR 71 | Ht 71.0 in | Wt 260.0 lb

## 2017-03-21 DIAGNOSIS — R0609 Other forms of dyspnea: Secondary | ICD-10-CM | POA: Diagnosis not present

## 2017-03-21 DIAGNOSIS — N183 Chronic kidney disease, stage 3 unspecified: Secondary | ICD-10-CM

## 2017-03-21 DIAGNOSIS — I5032 Chronic diastolic (congestive) heart failure: Secondary | ICD-10-CM

## 2017-03-21 DIAGNOSIS — I480 Paroxysmal atrial fibrillation: Secondary | ICD-10-CM | POA: Diagnosis not present

## 2017-03-21 LAB — CBC
HEMATOCRIT: 28.4 % — AB (ref 38.5–50.0)
Hemoglobin: 8.8 g/dL — ABNORMAL LOW (ref 13.2–17.1)
MCH: 23.3 pg — AB (ref 27.0–33.0)
MCHC: 31 g/dL — AB (ref 32.0–36.0)
MCV: 75.1 fL — AB (ref 80.0–100.0)
PLATELETS: 311 10*3/uL (ref 140–400)
RBC: 3.78 MIL/uL — ABNORMAL LOW (ref 4.20–5.80)
RDW: 30.2 % — AB (ref 11.0–15.0)
WBC: 5.1 10*3/uL (ref 3.8–10.8)

## 2017-03-21 LAB — BASIC METABOLIC PANEL
BUN: 31 mg/dL — ABNORMAL HIGH (ref 7–25)
CHLORIDE: 105 mmol/L (ref 98–110)
CO2: 29 mmol/L (ref 20–31)
Calcium: 8.4 mg/dL — ABNORMAL LOW (ref 8.6–10.3)
Creat: 1.82 mg/dL — ABNORMAL HIGH (ref 0.70–1.18)
GLUCOSE: 92 mg/dL (ref 65–99)
POTASSIUM: 4 mmol/L (ref 3.5–5.3)
SODIUM: 143 mmol/L (ref 135–146)

## 2017-03-21 MED ORDER — FUROSEMIDE 80 MG PO TABS: 80.0000 mg | ORAL_TABLET | Freq: Two times a day (BID) | ORAL | 3 refills | Status: DC

## 2017-03-21 MED ORDER — AMIODARONE HCL 200 MG PO TABS: 200.0000 mg | ORAL_TABLET | Freq: Every day | ORAL | 3 refills | Status: AC

## 2017-03-21 MED ORDER — PRAVASTATIN SODIUM 40 MG PO TABS: 40.0000 mg | ORAL_TABLET | Freq: Every evening | ORAL | 3 refills | Status: AC

## 2017-03-21 NOTE — Progress Notes (Signed)
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 LHC 10/2009: pLAD 20%, dLAD 30%, mOM2 70-80% (unchanged compared to previous), pRCA 20%, dRCA 20%. Admitted in February of 2012 with a wide complex tachycardia. Seen by EP and rhythm was most likely felt to be SVT with aberrancy. It was felt that if symptoms recur ablation would be warranted. Cardiac MRI (09/05/13): Moderate LVE, focal septal hypertrophy, EF 51%, global HK, mild RVE, normal RVSF, no evidence of cardiac amyloid. Nuclear study in March of 2015 showed apical thinning but no ischemia. Ejection fraction was 48%. Last cardioversion for atrial fibrillation was on 02/06/2016. Monitor 3/17 showed sinus with first degree AV block. Pt requested DC of anticoagulation at last ov as he had fallen and developed hemarthrosis. Echocardiogram repeated February 2018 and showed ejection fraction 40-45%, moderate left atrial enlargement, moderate tricuspid regurgitation. Since last seen, he notes increased dyspnea on exertion. He denies orthopnea, PND, chest pain or syncope. Occasional dizziness. He has chronic pedal edema.  Current Outpatient Prescriptions  Medication Sig Dispense Refill  . aspirin EC 81 MG tablet Take 1 tablet (81 mg total) by mouth daily. 90 tablet 3  . fluticasone (FLONASE) 50 MCG/ACT nasal spray Place 2 sprays into both nostrils daily. 16 g 1  . furosemide (LASIX) 40 MG tablet Take 1 tablet (40 mg total) by mouth 2 (two) times daily. 180 tablet 3  . naproxen sodium (ANAPROX) 220 MG tablet Take 220 mg by mouth daily.     No current facility-administered medications for this visit.    Facility-Administered Medications Ordered in Other Visits  Medication Dose Route Frequency Provider Last Rate Last Dose  . iopamidol (ISOVUE-M) 41 % intrathecal injection 16 mL  16 mL Intrathecal Once Kristeen Miss, MD         Past Medical History:  Diagnosis Date  . Adenomatous colon  polyp 2003   max size 29mm  . Amyloidosis    seen at [facility]  . Atrial fibrillation ([place])   . Benign neoplasm of colon   . Cancer (HCC)    amyloidosis  . Cerebrovascular disease, unspecified   . Chronic venous insufficiency 2016  . Coronary atherosclerosis of unspecified type of vessel, native or graft    Non obstructive  . Cough, persistent 09/29/2015  . DDD (degenerative disc disease), lumbar    Dr. Katherine Roan  . Degenerative disc disease   . Diverticulosis of colon (without mention of hemorrhage)   . Edema 08/29/2013  . Hemangioma   . Hx of cardiovascular stress test    Adenosine Myoview (02/2014): Apical thinning, no ischemia, EF 48%; low risk.  Marland Kitchen Hypertension   . LBBB (left bundle branch block)   . Low back pain    chronic, seeing pain management  . 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 OSA (obstructive sleep apnea)   . Pure hypercholesterolemia   . Scrotal pain 10/23/2014  . Shortness of breath dyspnea   . 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 Traumatic open wound of left lower leg with delayed healing    Seen at [facility] on 05/29/2015  . Unspecified hemorrhoids without mention of complication   . Wide-complex tachycardia ([place]) 01/2011   Felt likely be SVT with abbarancy    Past Surgical History:  Procedure Laterality Date  . BACK SURGERY  2012   Lumbar,  [place]  . 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 CARDIOVERSION N/A 07/10/2013   Procedure: CARDIOVERSION;  Surgeon: Lelon Perla, MD;  Location: [facility] ENDOSCOPY;  Service: Cardiovascular;  Laterality: N/A;  . CARDIOVERSION N/A 02/06/2016   Procedure: CARDIOVERSION;  Surgeon: Skeet Latch, MD;  Location: [place];  Service: Cardiovascular;  Laterality: N/A;  . CARPAL TUNNEL RELEASE  08/2006   Dr Doy Mince at [place]    . KNEE ARTHROSCOPY    . Lumbar Medial Branch Block   2017   left L2-5, S1 and right L2-5 and S1 medial branches    Social History   Social History  . Marital status: Married    Spouse name: N/A  . Number of children: 2  . Years of education: N/A   Occupational History  . PRESIDENT [employer]   Social History Main Topics  . Smoking status: Never Smoker  . Smokeless tobacco: Never Used  . Alcohol use 0.0 oz/week     Comment: Occassionally  . Drug use: No  . Sexual activity: No   Other Topics Concern  . Not on file   Social History Narrative   Lives with wife.      Family History  Problem Relation Age of Onset  . Lung cancer Mother   . Skin cancer Father     Melanoma  . Colon cancer Neg Hx   . Colon polyps Neg Hx   . Kidney disease Neg Hx   . Esophageal cancer Neg Hx   . Diabetes Neg Hx   . Gallbladder disease Neg Hx     ROS: no fevers or chills, productive cough, hemoptysis, dysphasia, odynophagia, melena, hematochezia, dysuria, hematuria, rash, seizure activity, orthopnea, PND, claudication. Remaining systems are negative.  Physical Exam: Well-developed well-nourished in no acute distress.  Skin is warm and dry.  HEENT is normal.  Neck is supple.  Chest is clear to auscultation with normal expansion.  Cardiovascular exam is regular rate and rhythm.  Abdominal exam nontender or distended. No masses palpated. Extremities show no edema. neuro grossly intact  ECG- Sinus rhythm with first-degree AV block, left bundle branch block. personally reviewed  A/P  1 Paroxysmal atrial fibrillation-patient in sinus rhythm today. Continue amiodarone. We discussed anticoagulation again today. He declines anticoagulation and understands the higher risk of stroke. Continue aspirin 81 mg daily.  2 supraventricular tachycardia-continue amiodarone.  3 hyperlipidemia-continue statin.  4 hypertension-blood pressure is controlled. Continue present medications.  5 coronary artery  disease-continue statin and ASA.  6 amyloidosis-per oncology.  7 chronic diastolic congestive heart failure-patient is volume overloaded on examination. Increase Lasix back to 80 mg twice a day. I discussed the importance of compliance. Check potassium and renal function in 1 week. We discussed the importance of fluid restriction and low sodium diet.  56 Dyspnea-I think this is likely secondary to acute on chronic combined systolic/diastolic congestive heart failure. Increased Lasix to 80 mg twice a day. In one week check potassium, renal function and BNP. He has also been very inactive. Check d-dimer and elevated check VQ scan. Finally amiodarone could potentially contribute to dyspnea if it was causing lung toxicity. Check chest x-ray and sedimentation rate.  9 Cardiomyopathy-LV function mildly reduced. No beta blocker given severity of first-degree AV block. I will not add an ACE inhibitor given renal insufficiency. Question secondary to cardiac amyloid. However there would be no intervention.     10  chronic stage III kidney disease-patient is taking Advil. I have asked him to discontinue. Check potassium and renal function in 1 week after increasing diuretic.  [name], MD

## 2017-03-21 NOTE — Patient Instructions (Signed)
Medication Instructions:   INCREASE FUROSEMIDE TO 80 MG TWICE DAILY= 2 OF THE 40 MG TABLETS TWICE DAILY  Labwork:  Your physician recommends that you HAVE LAB WORK TODAY  Your physician recommends that you return for lab work in: ONE WEEK  Testing/Procedures:  A chest x-ray takes a picture of the organs and structures inside the chest, including the heart, lungs, and blood vessels. This test can show several things, including, whether the heart is enlarges; whether fluid is building up in the lungs; and whether pacemaker / defibrillator leads are still in place.   Follow-Up:  Your physician recommends that you schedule a follow-up appointment in: [place]

## 2017-03-22 ENCOUNTER — Telehealth: Payer: Self-pay | Admitting: *Deleted

## 2017-03-22 ENCOUNTER — Other Ambulatory Visit: Payer: Self-pay | Admitting: Hematology and Oncology

## 2017-03-22 ENCOUNTER — Ambulatory Visit: Payer: Medicare Other

## 2017-03-22 ENCOUNTER — Telehealth: Payer: Self-pay | Admitting: Hematology and Oncology

## 2017-03-22 DIAGNOSIS — Z5181 Encounter for therapeutic drug level monitoring: Secondary | ICD-10-CM | POA: Diagnosis not present

## 2017-03-22 DIAGNOSIS — Z79899 Other long term (current) drug therapy: Secondary | ICD-10-CM | POA: Diagnosis not present

## 2017-03-22 DIAGNOSIS — E859 Amyloidosis, unspecified: Secondary | ICD-10-CM

## 2017-03-22 DIAGNOSIS — R7989 Other specified abnormal findings of blood chemistry: Secondary | ICD-10-CM

## 2017-03-22 DIAGNOSIS — C9000 Multiple myeloma not having achieved remission: Secondary | ICD-10-CM | POA: Diagnosis not present

## 2017-03-22 LAB — BRAIN NATRIURETIC PEPTIDE: BRAIN NATRIURETIC PEPTIDE: 224.6 pg/mL — AB (ref <100)

## 2017-03-22 LAB — SEDIMENTATION RATE: SED RATE: 9 mm/h (ref 0–20)

## 2017-03-22 LAB — D-DIMER, QUANTITATIVE (NOT AT ARMC): D DIMER QUANT: 1.3 ug{FEU}/mL — AB (ref <0.50)

## 2017-03-22 NOTE — Telephone Encounter (Signed)
[facility] called and said the patient needs a follow up appointment with Dr Alvy Bimler

## 2017-03-22 NOTE — Telephone Encounter (Signed)
I placed scheduling msg 

## 2017-03-22 NOTE — Telephone Encounter (Signed)
-----   Message from Lelon Perla, MD sent at 03/22/2017 12:49 PM EDT ----- VQ scan; fu heme for anemia [name]

## 2017-03-22 NOTE — Telephone Encounter (Signed)
pt aware of results, VQ scan scheduled @ [facility] 03/30/17, 12:30p for the cxr and 1pm for VQ scan.

## 2017-03-23 ENCOUNTER — Encounter: Payer: Self-pay | Admitting: Cardiology

## 2017-03-23 NOTE — Telephone Encounter (Signed)
This encounter was created in error - please disregard.

## 2017-03-23 NOTE — Telephone Encounter (Signed)
Spoke with pt he states that he wants to discuss his scan next week with her. States that he only wants to tak to [place]-he will wait for her return

## 2017-03-23 NOTE — Telephone Encounter (Signed)
New Message  Pt voiced wanting to speak with Hilda Blades.Marland KitchenMarland KitchenPlease f/u

## 2017-03-30 ENCOUNTER — Ambulatory Visit (HOSPITAL_COMMUNITY)
Admission: RE | Admit: 2017-03-30 | Discharge: 2017-03-30 | Disposition: A | Discharge status: Home / Self Care | Payer: Medicare Other | Source: Ambulatory Visit | Attending: Cardiology | Admitting: Cardiology

## 2017-03-30 DIAGNOSIS — I700 Atherosclerosis of aorta: Secondary | ICD-10-CM | POA: Insufficient documentation

## 2017-03-30 DIAGNOSIS — R791 Abnormal coagulation profile: Secondary | ICD-10-CM | POA: Diagnosis not present

## 2017-03-30 DIAGNOSIS — R7989 Other specified abnormal findings of blood chemistry: Secondary | ICD-10-CM | POA: Diagnosis not present

## 2017-03-30 DIAGNOSIS — J449 Chronic obstructive pulmonary disease, unspecified: Secondary | ICD-10-CM | POA: Diagnosis not present

## 2017-03-30 DIAGNOSIS — I509 Heart failure, unspecified: Secondary | ICD-10-CM | POA: Insufficient documentation

## 2017-03-30 DIAGNOSIS — R0602 Shortness of breath: Secondary | ICD-10-CM | POA: Diagnosis not present

## 2017-03-30 MED ORDER — TECHNETIUM TO 99M ALBUMIN AGGREGATED
3.9600 | Freq: Once | INTRAVENOUS | Status: AC
  Administered 2017-03-30: 3.96 via INTRAVENOUS

## 2017-03-30 MED ORDER — TECHNETIUM TC 99M DIETHYLENETRIAME-PENTAACETIC ACID
32.6000 | Freq: Once | INTRAVENOUS | Status: AC
  Administered 2017-03-30: 32.6 via RESPIRATORY_TRACT

## 2017-03-31 ENCOUNTER — Telehealth: Payer: Self-pay | Admitting: *Deleted

## 2017-03-31 DIAGNOSIS — I509 Heart failure, unspecified: Secondary | ICD-10-CM

## 2017-03-31 MED ORDER — METOLAZONE 2.5 MG PO TABS: ORAL_TABLET | ORAL | 3 refills | Status: DC

## 2017-03-31 NOTE — Telephone Encounter (Signed)
Spoke with pt, Aware of dr crenshaw's recommendations.  ?New script sent to the pharmacy  ?Lab orders mailed to the pt  ?

## 2017-03-31 NOTE — Telephone Encounter (Signed)
-----   Message from Lelon Perla, MD sent at 03/30/2017  4:44 PM EDT ----- Add metolazone 2.5 mg po MWF (take 30 min before AM lasix dose); bmet one week [name]

## 2017-04-04 ENCOUNTER — Telehealth: Payer: Self-pay | Admitting: *Deleted

## 2017-04-04 ENCOUNTER — Other Ambulatory Visit (HOSPITAL_BASED_OUTPATIENT_CLINIC_OR_DEPARTMENT_OTHER): Payer: Medicare Other

## 2017-04-04 ENCOUNTER — Telehealth: Payer: Self-pay

## 2017-04-04 DIAGNOSIS — N183 Chronic kidney disease, stage 3 unspecified: Secondary | ICD-10-CM

## 2017-04-04 DIAGNOSIS — R0609 Other forms of dyspnea: Secondary | ICD-10-CM

## 2017-04-04 DIAGNOSIS — E859 Amyloidosis, unspecified: Secondary | ICD-10-CM | POA: Diagnosis not present

## 2017-04-04 LAB — COMPREHENSIVE METABOLIC PANEL
ALT: 8 U/L (ref 0–55)
ANION GAP: 12 meq/L — AB (ref 3–11)
AST: 16 U/L (ref 5–34)
Albumin: 3.4 g/dL — ABNORMAL LOW (ref 3.5–5.0)
Alkaline Phosphatase: 107 U/L (ref 40–150)
BUN: 52.8 mg/dL — ABNORMAL HIGH (ref 7.0–26.0)
CHLORIDE: 96 meq/L — AB (ref 98–109)
CO2: 32 meq/L — AB (ref 22–29)
Calcium: 9.4 mg/dL (ref 8.4–10.4)
Creatinine: 2.7 mg/dL — ABNORMAL HIGH (ref 0.7–1.3)
EGFR: 22 mL/min/{1.73_m2} — AB (ref >90)
Glucose: 103 mg/dl (ref 70–140)
POTASSIUM: 4.2 meq/L (ref 3.5–5.1)
SODIUM: 140 meq/L (ref 136–145)
Total Bilirubin: 1.35 mg/dL — ABNORMAL HIGH (ref 0.20–1.20)
Total Protein: 7.4 g/dL (ref 6.4–8.3)

## 2017-04-04 LAB — CBC WITH DIFFERENTIAL/PLATELET
BASO%: 3.2 % — AB (ref 0.0–2.0)
BASOS ABS: 0.2 10*3/uL — AB (ref 0.0–0.1)
EOS ABS: 0.3 10*3/uL (ref 0.0–0.5)
EOS%: 5.6 % (ref 0.0–7.0)
HEMATOCRIT: 30.5 % — AB (ref 38.4–49.9)
HGB: 10.2 g/dL — ABNORMAL LOW (ref 13.0–17.1)
LYMPH#: 1.2 10*3/uL (ref 0.9–3.3)
LYMPH%: 24.1 % (ref 14.0–49.0)
MCH: 23.8 pg — ABNORMAL LOW (ref 27.2–33.4)
MCHC: 33.5 g/dL (ref 32.0–36.0)
MCV: 71.2 fL — ABNORMAL LOW (ref 79.3–98.0)
MONO#: 2.3 10*3/uL — AB (ref 0.1–0.9)
MONO%: 44.1 % — ABNORMAL HIGH (ref 0.0–14.0)
NEUT#: 1.2 10*3/uL — ABNORMAL LOW (ref 1.5–6.5)
NEUT%: 23 % — ABNORMAL LOW (ref 39.0–75.0)
PLATELETS: 343 10*3/uL (ref 140–400)
RBC: 4.29 10*6/uL (ref 4.20–5.82)
RDW: 36.5 % — ABNORMAL HIGH (ref 11.0–14.6)
WBC: 5.2 10*3/uL (ref 4.0–10.3)

## 2017-04-04 LAB — TECHNOLOGIST REVIEW

## 2017-04-04 NOTE — Telephone Encounter (Signed)
Left below message and instructed to call for any problems.

## 2017-04-04 NOTE — Telephone Encounter (Signed)
-----   Message from Heath Lark, MD sent at 04/04/2017  2:43 PM EDT ----- Regarding: abnormal labs Patient is noted to have abnormal creatinine function I have sent a note to Dr. [name]; please ask him to call his office for further directions re: diuretic therapy ----- Message ----- From: Interface, Lab In Three Zero One Sent: 04/04/2017   2:37 PM To: Heath Lark, MD

## 2017-04-04 NOTE — Telephone Encounter (Signed)
-----   Message from Lelon Perla, MD sent at 04/04/2017  3:11 PM EDT ----- Regarding: FW: acute renal failure Hold lasix for 2 days and then resume at 40 mg BID; bmet one week [name]  ----- Message ----- From: Heath Lark, MD Sent: 04/04/2017   2:41 PM To: Lelon Perla, MD Subject: acute renal failure                            Hi,  I scheduled labs today in anticipation to see him next week for follow-up on amyloidosis. Labs showed acute renal failure and I noted you increased his diuretics recently.  I am not sure if you plan to change his medications now with his abnormal labs.  I will let him know to call your office for directions re: medication changes  Thanks, Ni

## 2017-04-05 LAB — KAPPA/LAMBDA LIGHT CHAINS
IG KAPPA FREE LIGHT CHAIN: 168 mg/L — AB (ref 3.3–19.4)
IG LAMBDA FREE LIGHT CHAIN: 45.8 mg/L — AB (ref 5.7–26.3)
Kappa/Lambda FluidC Ratio: 3.67 — ABNORMAL HIGH (ref 0.26–1.65)

## 2017-04-05 MED ORDER — FUROSEMIDE 80 MG PO TABS: 40.0000 mg | ORAL_TABLET | Freq: Two times a day (BID) | ORAL | 3 refills | Status: DC

## 2017-04-05 NOTE — Telephone Encounter (Signed)
Spoke with pt, he is currently taking furosemide 40 mg twice daily. Patient voiced understanding to hold furosemide and metolazone for the next 2 days. Will make dr [name] aware of current dosage and call the patient back.

## 2017-04-05 NOTE — Telephone Encounter (Signed)
DC metolazone; resume lasix 40 BID in 3 days; bmet one week [name]

## 2017-04-05 NOTE — Telephone Encounter (Signed)
Patient voiced understanding of medication changes. Lab orders mailed to the pt.

## 2017-04-08 LAB — MULTIPLE MYELOMA PANEL, SERUM
ALBUMIN/GLOB SERPL: 1 (ref 0.7–1.7)
ALPHA2 GLOB SERPL ELPH-MCNC: 0.8 g/dL (ref 0.4–1.0)
Albumin SerPl Elph-Mcnc: 3.4 g/dL (ref 2.9–4.4)
Alpha 1: 0.2 g/dL (ref 0.0–0.4)
B-Globulin SerPl Elph-Mcnc: 1 g/dL (ref 0.7–1.3)
GAMMA GLOB SERPL ELPH-MCNC: 1.5 g/dL (ref 0.4–1.8)
GLOBULIN, TOTAL: 3.5 g/dL (ref 2.2–3.9)
IgA, Qn, Serum: 279 mg/dL (ref 61–437)
IgM, Qn, Serum: 71 mg/dL (ref 15–143)
TOTAL PROTEIN: 6.9 g/dL (ref 6.0–8.5)

## 2017-04-11 ENCOUNTER — Ambulatory Visit (HOSPITAL_BASED_OUTPATIENT_CLINIC_OR_DEPARTMENT_OTHER): Payer: Medicare Other | Admitting: Hematology and Oncology

## 2017-04-11 ENCOUNTER — Ambulatory Visit (HOSPITAL_BASED_OUTPATIENT_CLINIC_OR_DEPARTMENT_OTHER): Payer: Medicare Other

## 2017-04-11 ENCOUNTER — Telehealth: Payer: Self-pay | Admitting: Hematology and Oncology

## 2017-04-11 VITALS — BP 117/50 | HR 77 | Temp 97.8°F | Resp 18 | Ht 71.0 in | Wt 243.4 lb

## 2017-04-11 DIAGNOSIS — D509 Iron deficiency anemia, unspecified: Secondary | ICD-10-CM | POA: Insufficient documentation

## 2017-04-11 DIAGNOSIS — I5032 Chronic diastolic (congestive) heart failure: Secondary | ICD-10-CM

## 2017-04-11 DIAGNOSIS — N183 Chronic kidney disease, stage 3 unspecified: Secondary | ICD-10-CM

## 2017-04-11 DIAGNOSIS — D630 Anemia in neoplastic disease: Secondary | ICD-10-CM | POA: Diagnosis not present

## 2017-04-11 DIAGNOSIS — E859 Amyloidosis, unspecified: Secondary | ICD-10-CM

## 2017-04-11 DIAGNOSIS — I5022 Chronic systolic (congestive) heart failure: Secondary | ICD-10-CM

## 2017-04-11 LAB — CBC & DIFF AND RETIC
BASO%: 5.3 % — AB (ref 0.0–2.0)
Basophils Absolute: 0.3 10*3/uL — ABNORMAL HIGH (ref 0.0–0.1)
EOS%: 9.3 % — AB (ref 0.0–7.0)
Eosinophils Absolute: 0.5 10*3/uL (ref 0.0–0.5)
HEMATOCRIT: 29.7 % — AB (ref 38.4–49.9)
HGB: 9.2 g/dL — ABNORMAL LOW (ref 13.0–17.1)
Immature Retic Fract: 10.1 % (ref 3.00–10.60)
LYMPH#: 1.9 10*3/uL (ref 0.9–3.3)
LYMPH%: 34.6 % (ref 14.0–49.0)
MCH: 22.7 pg — AB (ref 27.2–33.4)
MCHC: 31 g/dL — ABNORMAL LOW (ref 32.0–36.0)
MCV: 73.2 fL — AB (ref 79.3–98.0)
MONO#: 1.2 10*3/uL — ABNORMAL HIGH (ref 0.1–0.9)
MONO%: 22.4 % — ABNORMAL HIGH (ref 0.0–14.0)
NEUT#: 1.6 10*3/uL (ref 1.5–6.5)
NEUT%: 28.4 % — AB (ref 39.0–75.0)
PLATELETS: 355 10*3/uL (ref 140–400)
RBC: 4.06 10*6/uL — ABNORMAL LOW (ref 4.20–5.82)
RDW: 32.5 % — ABNORMAL HIGH (ref 11.0–14.6)
Retic %: 1.42 % (ref 0.80–1.80)
Retic Ct Abs: 57.65 10*3/uL (ref 34.80–93.90)
WBC: 5.5 10*3/uL (ref 4.0–10.3)
nRBC: 1 % — ABNORMAL HIGH (ref 0–0)

## 2017-04-11 LAB — COMPREHENSIVE METABOLIC PANEL
ALBUMIN: 3.6 g/dL (ref 3.5–5.0)
ALK PHOS: 120 U/L (ref 40–150)
ALT: 11 U/L (ref 0–55)
AST: 17 U/L (ref 5–34)
Anion Gap: 8 mEq/L (ref 3–11)
BILIRUBIN TOTAL: 0.98 mg/dL (ref 0.20–1.20)
BUN: 46.4 mg/dL — ABNORMAL HIGH (ref 7.0–26.0)
CO2: 31 mEq/L — ABNORMAL HIGH (ref 22–29)
Calcium: 9.5 mg/dL (ref 8.4–10.4)
Chloride: 102 mEq/L (ref 98–109)
Creatinine: 1.8 mg/dL — ABNORMAL HIGH (ref 0.7–1.3)
EGFR: 35 mL/min/{1.73_m2} — AB (ref >90)
GLUCOSE: 93 mg/dL (ref 70–140)
Potassium: 3.8 mEq/L (ref 3.5–5.1)
SODIUM: 142 meq/L (ref 136–145)
TOTAL PROTEIN: 7.4 g/dL (ref 6.4–8.3)

## 2017-04-11 LAB — TECHNOLOGIST REVIEW

## 2017-04-11 NOTE — Telephone Encounter (Signed)
Lab appointment added for today,per 04/11/17 los. Appointments scheduled per 04/11/17 los.  Patient was given a copy of the AVS report and appointment schedule per 04/11/17 los.

## 2017-04-12 ENCOUNTER — Encounter: Payer: Self-pay | Admitting: Hematology and Oncology

## 2017-04-12 LAB — IRON AND TIBC
%SAT: 21 % (ref 20–55)
IRON: 58 ug/dL (ref 42–163)
TIBC: 274 ug/dL (ref 202–409)
UIBC: 216 ug/dL (ref 117–376)

## 2017-04-12 LAB — KAPPA/LAMBDA LIGHT CHAINS
IG KAPPA FREE LIGHT CHAIN: 156.3 mg/L — AB (ref 3.3–19.4)
Ig Lambda Free Light Chain: 39.9 mg/L — ABNORMAL HIGH (ref 5.7–26.3)
Kappa/Lambda FluidC Ratio: 3.92 — ABNORMAL HIGH (ref 0.26–1.65)

## 2017-04-12 LAB — FERRITIN: FERRITIN: 145 ng/mL (ref 22–316)

## 2017-04-12 NOTE — Assessment & Plan Note (Signed)
He had recent acute on chronic renal failure secondary to diuretics Per patient request, we will repeat his lab work today and I will forward results to his cardiologist for further management

## 2017-04-12 NOTE — Assessment & Plan Note (Addendum)
He has multifactorial anemia I am concerned about recent microcytic anemia and will order additional iron studies

## 2017-04-12 NOTE — Assessment & Plan Note (Signed)
The patient had recent acute on chronic renal failure an exacerbation of heart failure I would defer to his cardiologist for further management

## 2017-04-12 NOTE — Progress Notes (Signed)
[facility] OFFICE PROGRESS NOTE  Patient Care Team: Colon Branch, MD as PCP - General (Internal Medicine) Lelon Perla, MD (Cardiology) Heath Lark, MD as Consulting Physician (Hematology and Oncology) Myrle Sheng, MD as Referring Physician (Neurosurgery) Gillis Santa, MD as Referring Physician (Pain Medicine)  SUMMARY OF ONCOLOGIC HISTORY:  He was diagnosed with systemic amyloidosis after presentation with shortness of breath. He has significant evaluation including bone marrow aspirate and biopsy. 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 The patient subsequently went for maintain dense chemotherapy with Velcade for almost 2 years and then switch to Revlimid. He self discontinue Revlimid in May of 2014 due to fatigue. In July 2015, we restarted him back on Velcade due to a rising light chain. In September 2015, the patient requested to add back dexamethasone at 20 mg every week. In November 2015, the patient reduce dexamethasone to 16 mg every week along with Velcade injection. We discontinued treatment on 11/20/2014  INTERVAL HISTORY: Please see below for problem oriented charting. The patient is seen recently at the request of his cardiologist He has not been followed in this clinic due to his follow-up at [facility] The patient does not want to go back to [facility] for some reason He has concern regarding recent blood work In the meantime, he is undergoing aggressive diuretic therapy under the guidance of his cardiologist due to recent exacerbation of congestive heart failure He denies recent infection No new bone pain  REVIEW OF SYSTEMS:   Constitutional: Denies fevers, chills or abnormal weight loss Eyes: Denies blurriness of vision Ears, nose,  mouth, throat, and face: Denies mucositis or sore throat Respiratory: Denies cough, dyspnea or wheezes Cardiovascular: Denies palpitation, chest discomfort  Gastrointestinal:  Denies nausea, heartburn or change in bowel habits Skin: Denies abnormal skin rashes Lymphatics: Denies new lymphadenopathy or easy bruising Neurological:Denies numbness, tingling or new weaknesses Behavioral/Psych: Mood is stable, no new changes  All other systems were reviewed with the patient and are negative.  I have reviewed the past medical history, past surgical history, social history and family history with the patient and they are unchanged from previous note.  ALLERGIES:  has No Known Allergies.  MEDICATIONS:  Current Outpatient Prescriptions  Medication Sig Dispense Refill  . amiodarone (PACERONE) 200 MG tablet Take 1 tablet (200 mg total) by mouth daily. 90 tablet 3  . aspirin EC 81 MG tablet Take 1 tablet (81 mg total) by mouth daily. 90 tablet 3  . loratadine (CLARITIN) 10 MG tablet Take 10 mg by mouth daily.    . pravastatin (PRAVACHOL) 40 MG tablet Take 1 tablet (40 mg total) by mouth every evening. 90 tablet 3  . traMADol (ULTRAM) 50 MG tablet Take 50 mg by mouth every 6 (six) hours as needed.    . furosemide (LASIX) 80 MG tablet Take 0.5 tablets (40 mg total) by mouth 2 (two) times daily. (Patient not taking: Reported on 04/11/2017) 180 tablet 3   No current facility-administered medications for this visit.    Facility-Administered Medications Ordered in Other Visits  Medication Dose Route Frequency Provider Last Rate Last Dose  . iopamidol (ISOVUE-M) 41 % intrathecal injection 16 mL  16 mL Intrathecal Once Kristeen Miss, MD        PHYSICAL EXAMINATION: ECOG PERFORMANCE STATUS: 1 - Symptomatic but  completely ambulatory  Vitals:   04/11/17 1412  BP: (!) 117/50  Pulse: 77  Resp: 18  Temp: 97.8 F (36.6 C)   Filed Weights   04/11/17 1412  Weight: 243 lb 6.4 oz (110.4 kg)     GENERAL:alert, no distress and comfortable SKIN: Noted skin bruising EYES: normal, Conjunctiva are pink and non-injected, sclera clear OROPHARYNX:no exudate, no erythema and lips, buccal mucosa, and tongue normal  NECK: supple, thyroid normal size, non-tender, without nodularity LYMPH:  no palpable lymphadenopathy in the cervical, axillary or inguinal LUNGS: clear to auscultation and percussion with normal breathing effort HEART: regular rate & rhythm and no murmurs with moderate bilateral lower extremity edema ABDOMEN:abdomen soft, non-tender and normal bowel sounds Musculoskeletal:no cyanosis of digits and no clubbing  NEURO: alert & oriented x 3 with fluent speech, no focal motor/sensory deficits  LABORATORY DATA:  I have reviewed the data as listed    Component Value Date/Time   NA 142 04/11/2017 1600   K 3.8 04/11/2017 1600   CL 105 03/21/2017 1624   CL 103 05/31/2013 0842   CO2 31 (H) 04/11/2017 1600   GLUCOSE 93 04/11/2017 1600   GLUCOSE 118 (H) 05/31/2013 0842   BUN 46.4 (H) 04/11/2017 1600   CREATININE 1.8 (H) 04/11/2017 1600   CALCIUM 9.5 04/11/2017 1600   PROT 7.4 04/11/2017 1600   ALBUMIN 3.6 04/11/2017 1600   AST 17 04/11/2017 1600   ALT 11 04/11/2017 1600   ALKPHOS 120 04/11/2017 1600   BILITOT 0.98 04/11/2017 1600   GFRNONAA 40 (L) 02/07/2016 0739   GFRNONAA 63 06/10/2014 1221   GFRAA 46 (L) 02/07/2016 0739   GFRAA 73 06/10/2014 1221    No results found for: SPEP, UPEP  Lab Results  Component Value Date   WBC 5.5 04/11/2017   NEUTROABS 1.6 04/11/2017   HGB 9.2 (L) 04/11/2017   HCT 29.7 (L) 04/11/2017   MCV 73.2 (L) 04/11/2017   PLT 355 04/11/2017      Chemistry      Component Value Date/Time   NA 142 04/11/2017 1600   K 3.8 04/11/2017 1600   CL 105 03/21/2017 1624   CL 103 05/31/2013 0842   CO2 31 (H) 04/11/2017 1600   BUN 46.4 (H) 04/11/2017 1600   CREATININE 1.8 (H) 04/11/2017 1600      Component Value Date/Time   CALCIUM 9.5  04/11/2017 1600   ALKPHOS 120 04/11/2017 1600   AST 17 04/11/2017 1600   ALT 11 04/11/2017 1600   BILITOT 0.98 04/11/2017 1600       RADIOGRAPHIC STUDIES: I have personally reviewed the radiological images as listed and agreed with the findings in the report. Dg Chest 2 View  Result Date: 03/30/2017 CLINICAL DATA:  Elevated D-dimer. History of atrial fibrillation. Shortness of breath for the past year, chronic CHF, nonsmoker. EXAM: CHEST  2 VIEW COMPARISON:  Chest x-ray of December 21, 2016 FINDINGS: The lungs remain mildly hyperinflated. There remains a small right pleural effusion and trace left pleural effusion. The interstitial markings remain increased. There is linear scarring in the periphery of the left lower lung. The cardiac silhouette is enlarged. The central pulmonary vascularity is prominent but there is less cephalization than on the previous study. There is calcification in the wall of the aortic arch. The bony structures exhibit no acute abnormalities. IMPRESSION: COPD. CHF with mild interstitial edema and small bilateral pleural effusions. No acute pneumonia. Thoracic aortic atherosclerosis. Electronically Signed   By: David  Martinique M.D.  On: 03/30/2017 15:09   Nm Pulmonary Perf And Vent  Result Date: 03/30/2017 CLINICAL DATA:  Elevated D-dimer. EXAM: NUCLEAR MEDICINE VENTILATION - PERFUSION LUNG SCAN TECHNIQUE: Ventilation images were obtained in multiple projections using inhaled aerosol Tc-65mDTPA. Perfusion images were obtained in multiple projections after intravenous injection of Tc-952mAA. RADIOPHARMACEUTICALS:  Thirty-three mCi Technetium-9936mPA aerosol inhalation and 3.9 mCi Technetium-69m44m IV COMPARISON:  Radiograph 03/30/2017 FINDINGS: Ventilation: No focal ventilation defect. Perfusion: No wedge shaped peripheral perfusion defects to suggest acute pulmonary embolism. IMPRESSION: No evidence acute pulmonary embolism. Electronically Signed   By: StewSuzy Bouchard.    On: 03/30/2017 16:35    ASSESSMENT & PLAN:  Amyloidosis (HCC)Colee patient has very poor understanding of his disease process We discussed measurement of serum light chain is not accurate in the setting of renal failure I recommend 24 hour urine collection and quantitative light chain and repeat serum light chain I plan to see him back in 2 weeks to review test results  Chronic kidney disease (CKD), stage III (moderate) He had recent acute on chronic renal failure secondary to diuretics Per patient request, we will repeat his lab work today and I will forward results to his cardiologist for further management  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 Comprehensive metabolic panel    Standing Status:   Future    Number of Occurrences:   1    Standing Expiration Date:   05/16/2018  . Kappa/lambda light chains    Standing Status:   Future    Number of Occurrences:   1    Standing Expiration Date:   05/16/2018  . IFE/Light Chains/TP Qn, 24-Hr Ur    Standing Status:   Future    Number of Occurrences:   1    Standing Expiration Date:   05/16/2018  . Ferritin    Standing Status:   Future    Number of Occurrences:   1    Standing Expiration Date:   05/16/2018  . Iron and TIBC    Standing Status:   Future    Number of Occurrences:   1    Standing Expiration Date:   05/16/2018  . CBC & Diff and Retic    Standing Status:   Future    Number of Occurrences:   1    Standing Expiration Date:   05/16/2018   All questions were answered. The patient knows to call the clinic with any problems, questions or concerns. No barriers to learning was detected. I spent 20  minutes counseling the patient face to face. The total time spent in the appointment was 25 minutes and more than 50% was on counseling and review of test results     Tonni Mansour GHeath Lark 04/12/2017 7:07 PM

## 2017-04-12 NOTE — Assessment & Plan Note (Signed)
The patient has very poor understanding of his disease process We discussed measurement of serum light chain is not accurate in the setting of renal failure I recommend 24 hour urine collection and quantitative light chain and repeat serum light chain I plan to see him back in 2 weeks to review test results

## 2017-04-14 DIAGNOSIS — N183 Chronic kidney disease, stage 3 (moderate): Secondary | ICD-10-CM | POA: Diagnosis not present

## 2017-04-14 NOTE — Progress Notes (Signed)
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 LHC 10/2009: pLAD 20%, dLAD 30%, mOM2 70-80% (unchanged compared to previous), pRCA 20%, dRCA 20%. Admitted in February of 2012 with a wide complex tachycardia. Seen by EP and rhythm was most likely felt to be SVT with aberrancy. It was felt that if symptoms recur ablation would be warranted. Cardiac MRI (09/05/13): Moderate LVE, focal septal hypertrophy, EF 51%, global HK, mild RVE, normal RVSF, no evidence of cardiac amyloid. Nuclear study in March of 2015 showed apical thinning but no ischemia. Ejection fraction was 48%. Last cardioversion for atrial fibrillation was on 02/06/2016. Monitor 3/17 showed sinus with first degree AV block. Pt requested DC of anticoagulation at last ov as he had fallen and developed hemarthrosis.Echocardiogram repeated February 2018 and showed ejection fraction 40-45%, moderate left atrial enlargement, moderate tricuspid regurgitation. At last office visit patient was complaining of increased dyspnea and felt to be volume overloaded. VQ scan was negative and chest x-ray showed mild interstitial edema and small bilateral pleural effusions. Sedimentation rate was 9 and BNP 224. Hemoglobin 8.8. Follow-up laboratories showed worsening renal function and his Lasix was reduced. Since last seen, patient has not taken Lasix in 1-1/2 weeks. His weight had decreased to 235 and is now back up to 240. He has some dyspnea on exertion but improved compared to previous prior to increasing his diuretics. No orthopnea or PND. Mild pedal edema. No chest pain. Some dizziness with standing.  Current Outpatient Prescriptions  Medication Sig Dispense Refill  . amiodarone (PACERONE) 200 MG tablet Take 1 tablet (200 mg total) by mouth daily. 90 tablet 3  . aspirin EC 81 MG tablet Take 1 tablet (81 mg total) by mouth daily. 90 tablet 3  . loratadine (CLARITIN) 10 MG tablet Take 10 mg  by mouth daily.    . pravastatin (PRAVACHOL) 40 MG tablet Take 1 tablet (40 mg total) by mouth every evening. 90 tablet 3  . traMADol (ULTRAM) 50 MG tablet Take 50 mg by mouth every 6 (six) hours as needed.     No current facility-administered medications for this visit.    Facility-Administered Medications Ordered in Other Visits  Medication Dose Route Frequency Provider Last Rate Last Dose  . iopamidol (ISOVUE-M) 41 % intrathecal injection 16 mL  16 mL Intrathecal Once Kristeen Miss, MD         Past Medical History:  Diagnosis Date  . Adenomatous colon polyp 2003   max size 77mm  . Amyloidosis    [facility]  . Atrial fibrillation ([place])   . Benign neoplasm of colon   . Cancer (HCC)    amyloidosis  . Cerebrovascular disease, unspecified   . Chronic venous insufficiency 2016  . Coronary atherosclerosis of unspecified type of vessel, native or graft    Non obstructive  . Cough, persistent 09/29/2015  . DDD (degenerative disc disease), lumbar    Dr. Katherine Roan  . Degenerative disc disease   . Diverticulosis of colon (without mention of hemorrhage)   . Edema 08/29/2013  . Hemangioma   . Hx of cardiovascular stress test    Adenosine Myoview (02/2014): Apical thinning, no ischemia, EF 48%; low risk.  Marland Kitchen Hypertension   . LBBB (left bundle branch block)   . Low back pain    chronic, seeing pain management  . Non-pressure chronic ulcer left lower leg, limited to breakdown skin ([place])    Seen at [facility]  on 05/29/2015  . OSA (obstructive sleep apnea)   . Pure hypercholesterolemia   . Scrotal pain 10/23/2014  . Shortness of breath dyspnea   . 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 Traumatic open wound of left lower leg with delayed healing    Seen at [facility] on 05/29/2015  . Unspecified hemorrhoids without mention of complication   . Wide-complex tachycardia ([place]) 01/2011   Felt likely be SVT with abbarancy    Past Surgical History:  Procedure  Laterality Date  . BACK SURGERY  2012   Lumbar, [name]  . 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 CARDIOVERSION N/A 07/10/2013   Procedure: CARDIOVERSION;  Surgeon: Lelon Perla, MD;  Location: [facility] ENDOSCOPY;  Service: Cardiovascular;  Laterality: N/A;  . CARDIOVERSION N/A 02/06/2016   Procedure: CARDIOVERSION;  Surgeon: Skeet Latch, MD;  Location: [place];  Service: Cardiovascular;  Laterality: N/A;  . CARPAL TUNNEL RELEASE  08/2006   Dr Doy Mince at [place]    . KNEE ARTHROSCOPY    . Lumbar Medial Branch Block  2017   left L2-5, S1 and right L2-5 and S1 medial branches    Social History   Social History  . Marital status: Married    Spouse name: N/A  . Number of children: 2  . Years of education: N/A   Occupational History  . PRESIDENT [employer]   Social History Main Topics  . Smoking status: Never Smoker  . Smokeless tobacco: Never Used  . Alcohol use 0.0 oz/week     Comment: Occassionally  . Drug use: No  . Sexual activity: No   Other Topics Concern  . Not on file   Social History Narrative   Lives with wife.      Family History  Problem Relation Age of Onset  . Lung cancer Mother   . Skin cancer Father     Melanoma  . Colon cancer Neg Hx   . Colon polyps Neg Hx   . Kidney disease Neg Hx   . Esophageal cancer Neg Hx   . Diabetes Neg Hx   . Gallbladder disease Neg Hx     ROS: no fevers or chills, productive cough, hemoptysis, dysphasia, odynophagia, melena, hematochezia, dysuria, hematuria, rash, seizure activity, orthopnea, PND, claudication. Remaining systems are negative.  Physical Exam: Well-developed well-nourished in no acute distress.  Skin is warm and dry.  HEENT is normal.  Neck is supple.  Chest is clear to auscultation with normal expansion.  Cardiovascular exam is regular rate and rhythm. 2/6 systolic murmur apex Abdominal exam nontender or distended. No  masses palpated. Extremities show Trace to 1+ edema. neuro grossly intact   A/P  1 Paroxysmal atrial fibrillation-patient in sinus rhythm on exam today. Continue amiodarone. Recent liver functions normal. Check TSH. He declines anticoagulation and understands the higher risk of stroke. Continue aspirin 81 mg daily.  2 supraventricular tachycardia-continue amiodarone.  3 hyperlipidemia-continue statin.  4 hypertension-blood pressure is controlled. Continue present medications.  5 coronary artery disease-continue statin and ASA.  6 amyloidosis-per oncology. Based on his laboratories this may have recurred. He is followed by oncology.   7 chronic diastolic congestive heart failure-patient's symptoms have improved after increasing his diuretics and he lost 11 pounds. However his renal function worsened and he began having orthostasis. His weight has now increased to 240. I have asked him to resume Lasix 40 mg  twice a day. Check potassium and renal function in 1 week. Check BNP as well. He will take an additional 40 mg of Lasix for weight gain of 2 pounds. He was educated on low sodium diet and fluid restriction.  8 Dyspnea-some improvement. This is likely secondary to congestive heart failure. His sedimentation rate was normal and I therefore think amiodarone toxicity is unlikely. VQ scan was unremarkable. Resume diuretic as outlined above.   9 Cardiomyopathy-LV function mildly reduced. No beta blocker given severity of first-degree AV block. I will not add an ACE inhibitor given renal insufficiency. He is also having some dizziness with standing.  Question secondary to cardiac amyloid. However there would be no intervention.   10 chronic stage III kidney disease-will check potassium and renal function.   [name], MD

## 2017-04-15 LAB — UIFE/LIGHT CHAINS/TP QN, 24-HR UR
FR KAPPA LT CH,24HR: 368 mg/(24.h)
FR LAMBDA LT CH,24HR: 5 mg/(24.h)
FREE LAMBDA LT CHAINS, UR: 3.52 mg/L (ref 0.24–6.66)
Free Kappa Lt Chains,Ur: 245 mg/L — ABNORMAL HIGH (ref 1.35–24.19)
Kappa/Lambda Ratio,U: 69.6 — ABNORMAL HIGH (ref 2.04–10.37)
PROTEIN,TOTAL,URINE: 14.6 mg/dL
Prot,24hr calculated: 219 mg/24 hr — ABNORMAL HIGH (ref 30–150)

## 2017-04-20 ENCOUNTER — Ambulatory Visit (INDEPENDENT_AMBULATORY_CARE_PROVIDER_SITE_OTHER): Payer: Medicare Other | Admitting: Cardiology

## 2017-04-20 ENCOUNTER — Encounter: Payer: Self-pay | Admitting: Cardiology

## 2017-04-20 VITALS — BP 119/65 | HR 73 | Ht 71.0 in | Wt 250.4 lb

## 2017-04-20 DIAGNOSIS — I480 Paroxysmal atrial fibrillation: Secondary | ICD-10-CM | POA: Diagnosis not present

## 2017-04-20 DIAGNOSIS — I2510 Atherosclerotic heart disease of native coronary artery without angina pectoris: Secondary | ICD-10-CM

## 2017-04-20 DIAGNOSIS — I509 Heart failure, unspecified: Secondary | ICD-10-CM

## 2017-04-20 DIAGNOSIS — N183 Chronic kidney disease, stage 3 unspecified: Secondary | ICD-10-CM

## 2017-04-20 DIAGNOSIS — R0602 Shortness of breath: Secondary | ICD-10-CM

## 2017-04-20 MED ORDER — FUROSEMIDE 40 MG PO TABS: 40.0000 mg | ORAL_TABLET | Freq: Every day | ORAL | 3 refills | Status: DC

## 2017-04-20 NOTE — Patient Instructions (Signed)
Medication Instructions:   RESTART FUROSEMIDE 40 MG TWICE DAILY  Labwork:  Your physician recommends that you return for lab work in: ONE WEEK= 3RD FLOOR [place] OFFICE  Follow-Up:  Your physician recommends that you schedule a follow-up appointment in: [place]

## 2017-04-25 ENCOUNTER — Telehealth: Payer: Self-pay | Admitting: Hematology and Oncology

## 2017-04-25 ENCOUNTER — Ambulatory Visit (HOSPITAL_BASED_OUTPATIENT_CLINIC_OR_DEPARTMENT_OTHER): Payer: Medicare Other | Admitting: Hematology and Oncology

## 2017-04-25 VITALS — BP 106/40 | HR 78 | Temp 98.6°F | Resp 18 | Ht 71.0 in | Wt 247.8 lb

## 2017-04-25 DIAGNOSIS — Z7189 Other specified counseling: Secondary | ICD-10-CM | POA: Diagnosis not present

## 2017-04-25 DIAGNOSIS — N183 Chronic kidney disease, stage 3 unspecified: Secondary | ICD-10-CM

## 2017-04-25 DIAGNOSIS — E859 Amyloidosis, unspecified: Secondary | ICD-10-CM | POA: Diagnosis not present

## 2017-04-25 DIAGNOSIS — I5032 Chronic diastolic (congestive) heart failure: Secondary | ICD-10-CM

## 2017-04-25 DIAGNOSIS — I10 Essential (primary) hypertension: Secondary | ICD-10-CM | POA: Diagnosis not present

## 2017-04-25 DIAGNOSIS — E119 Type 2 diabetes mellitus without complications: Secondary | ICD-10-CM | POA: Diagnosis not present

## 2017-04-25 DIAGNOSIS — D630 Anemia in neoplastic disease: Secondary | ICD-10-CM

## 2017-04-25 MED ORDER — PROCHLORPERAZINE MALEATE 10 MG PO TABS: 10.0000 mg | ORAL_TABLET | Freq: Four times a day (QID) | ORAL | 1 refills | Status: DC | PRN

## 2017-04-25 MED ORDER — ACYCLOVIR 400 MG PO TABS: 400.0000 mg | ORAL_TABLET | Freq: Every day | ORAL | 3 refills | Status: DC

## 2017-04-25 MED ORDER — ONDANSETRON HCL 8 MG PO TABS: 8.0000 mg | ORAL_TABLET | Freq: Two times a day (BID) | ORAL | 1 refills | Status: DC | PRN

## 2017-04-25 MED ORDER — DEXAMETHASONE 4 MG PO TABS: 4.0000 mg | ORAL_TABLET | Freq: Every day | ORAL | 1 refills | Status: AC

## 2017-04-25 NOTE — Telephone Encounter (Signed)
Gave patient AVS and calender per 5/14 los  

## 2017-04-25 NOTE — Progress Notes (Signed)
START OFF PATHWAY REGIMEN - [Other Dx]   [id number]:Bortezomib (Velcade) Maintenance x 5 cycles:   A cycle is weekly:     Bortezomib   **Always confirm dose/schedule in your pharmacy ordering system**    Intent of Therapy: Non-Curative / Palliative Intent, Discussed with Patient

## 2017-04-26 ENCOUNTER — Telehealth: Payer: Self-pay | Admitting: Internal Medicine

## 2017-04-26 ENCOUNTER — Encounter: Payer: Self-pay | Admitting: Hematology and Oncology

## 2017-04-26 DIAGNOSIS — Z7189 Other specified counseling: Secondary | ICD-10-CM | POA: Insufficient documentation

## 2017-04-26 DIAGNOSIS — S0990XA Unspecified injury of head, initial encounter: Secondary | ICD-10-CM | POA: Diagnosis not present

## 2017-04-26 DIAGNOSIS — W19XXXA Unspecified fall, initial encounter: Secondary | ICD-10-CM | POA: Diagnosis not present

## 2017-04-26 DIAGNOSIS — S0101XA Laceration without foreign body of scalp, initial encounter: Secondary | ICD-10-CM | POA: Diagnosis not present

## 2017-04-26 DIAGNOSIS — W06XXXA Fall from bed, initial encounter: Secondary | ICD-10-CM | POA: Diagnosis not present

## 2017-04-26 DIAGNOSIS — S51812A Laceration without foreign body of left forearm, initial encounter: Secondary | ICD-10-CM | POA: Diagnosis not present

## 2017-04-26 DIAGNOSIS — M542 Cervicalgia: Secondary | ICD-10-CM | POA: Diagnosis not present

## 2017-04-26 DIAGNOSIS — G8911 Acute pain due to trauma: Secondary | ICD-10-CM | POA: Diagnosis not present

## 2017-04-26 DIAGNOSIS — S80212A Abrasion, left knee, initial encounter: Secondary | ICD-10-CM | POA: Diagnosis not present

## 2017-04-26 DIAGNOSIS — S0083XA Contusion of other part of head, initial encounter: Secondary | ICD-10-CM | POA: Diagnosis not present

## 2017-04-26 NOTE — Telephone Encounter (Signed)
Caller name: Erman,Doris Relation to pt: spouse  Call back number: [phone]  Pharmacy:  Reason for call:  Spouse requesting orders to  wound care in [place] (spouse doesnt know the name of wound care) due to patient falling over the weekend spouse would like patient head and leg accessed, spouse states EMT decided patient didn't need to go to the hospital. Spouse requesting bed rails for bed and cane with 3 prongs. Spouse denied appointment patient is not feeling well enough to come in for visit, please advise

## 2017-04-26 NOTE — Assessment & Plan Note (Signed)
He has multifactorial anemia  He is not symptomatic Observe only 

## 2017-04-26 NOTE — Assessment & Plan Note (Signed)
The patient is aware he has incurable disease and treatment is strictly palliative. We discussed importance of Advanced Directives and Living will.  

## 2017-04-26 NOTE — Telephone Encounter (Signed)
Patient informed regarding message below, patient declined appointment at this time stating he's not feeling well, patient stated call he's wife, lvm advising wife to call back

## 2017-04-26 NOTE — Assessment & Plan Note (Signed)
He had chronic bilateral lower extremity edema and follows with cardiologist closely. We discussed other treatment options including compression hose to help reduce leg swelling but the patient declined

## 2017-04-26 NOTE — Progress Notes (Signed)
[place] OFFICE PROGRESS NOTE  Patient Care Team: Colon Branch, MD as PCP - General (Internal Medicine) [name] Denice Bors, MD (Cardiology) Heath Lark, MD as Consulting Physician (Hematology and Oncology) Myrle Sheng, MD as Referring Physician (Neurosurgery) Gillis Santa, MD as Referring Physician (Pain Medicine)  SUMMARY OF ONCOLOGIC HISTORY:  He was diagnosed with systemic amyloidosis after presentation with shortness of breath. He has significant evaluation including bone marrow aspirate and biopsy. 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 The patient subsequently went for maintain dense chemotherapy with Velcade for almost 2 years and then switch to Revlimid. He self discontinue Revlimid in May of 2014 due to fatigue. In July 2015, we restarted him back on Velcade due to a rising light chain. In September 2015, the patient requested to add back dexamethasone at 20 mg every week. In November 2015, the patient reduce dexamethasone to 16 mg every week along with Velcade injection. We discontinued treatment on 11/20/2014  INTERVAL HISTORY: Please see below for problem oriented charting. He returns with his wife He complained of leg swelling He does not know what his dry weight is supposed to be He denies neuropathy from prior treatment He denies chest pain, shortness of breath  REVIEW OF SYSTEMS:   Constitutional: Denies fevers, chills or abnormal weight loss Eyes: Denies blurriness of vision Ears, nose, mouth, throat, and face: Denies mucositis or sore throat Respiratory: Denies cough, dyspnea or wheezes Cardiovascular: Denies palpitation, chest discomfort  Gastrointestinal:  Denies nausea, heartburn or change in bowel habits Skin:  Denies abnormal skin rashes Lymphatics: Denies new lymphadenopathy or easy bruising Neurological:Denies numbness, tingling or new weaknesses Behavioral/Psych: Mood is stable, no new changes  All other systems were reviewed with the patient and are negative.  I have reviewed the past medical history, past surgical history, social history and family history with the patient and they are unchanged from previous note.  ALLERGIES:  has No Known Allergies.  MEDICATIONS:  Current Outpatient Prescriptions  Medication Sig Dispense Refill  . acyclovir (ZOVIRAX) 400 MG tablet Take 1 tablet (400 mg total) by mouth daily. 30 tablet 3  . amiodarone (PACERONE) 200 MG tablet Take 1 tablet (200 mg total) by mouth daily. 90 tablet 3  . aspirin EC 81 MG tablet Take 1 tablet (81 mg total) by mouth daily. 90 tablet 3  . dexamethasone (DECADRON) 4 MG tablet Take 1 tablet (4 mg total) by mouth daily. 30 tablet 1  . furosemide (LASIX) 40 MG tablet Take 1 tablet (40 mg total) by mouth daily. 90 tablet 3  . loratadine (CLARITIN) 10 MG tablet Take 10 mg by mouth daily.    . ondansetron (ZOFRAN) 8 MG tablet Take 1 tablet (8 mg total) by mouth 2 (two) times daily as needed (Nausea or vomiting). 30 tablet 1  . pravastatin (PRAVACHOL) 40 MG tablet Take 1 tablet (40 mg total) by mouth every evening. 90 tablet 3  . prochlorperazine (COMPAZINE) 10 MG tablet Take 1 tablet (10 mg total) by mouth every 6 (six) hours as needed (Nausea or vomiting). 30 tablet 1  . traMADol (ULTRAM) 50 MG tablet Take 50 mg by mouth every 6 (six) hours as needed.     No current facility-administered medications for this visit.    Facility-Administered Medications Ordered in Other Visits  Medication  Dose Route Frequency Provider Last Rate Last Dose  . iopamidol (ISOVUE-M) 41 % intrathecal injection 16 mL  16 mL Intrathecal Once Kristeen Miss, MD        PHYSICAL EXAMINATION: ECOG PERFORMANCE STATUS: 1 - Symptomatic but completely  ambulatory  Vitals:   04/25/17 1543  BP: (!) 106/40  Pulse: 78  Resp: 18  Temp: 98.6 F (37 C)   Filed Weights   04/25/17 1543  Weight: 247 lb 12.8 oz (112.4 kg)    GENERAL:alert, no distress and comfortable SKIN: skin color, texture, turgor are normal, no rashes or significant lesions.  Noted skin bruises EYES: normal, Conjunctiva are pink and non-injected, sclera clear OROPHARYNX:no exudate, no erythema and lips, buccal mucosa, and tongue normal  NECK: supple, thyroid normal size, non-tender, without nodularity LYMPH:  no palpable lymphadenopathy in the cervical, axillary or inguinal LUNGS: clear to auscultation and percussion with normal breathing effort HEART: regular rate & rhythm and no murmurs with moderate bilateral lower extremity edema ABDOMEN:abdomen soft, non-tender and normal bowel sounds Musculoskeletal:no cyanosis of digits and no clubbing  NEURO: alert & oriented x 3 with fluent speech, no focal motor/sensory deficits  LABORATORY DATA:  I have reviewed the data as listed    Component Value Date/Time   NA 142 04/11/2017 1600   K 3.8 04/11/2017 1600   CL 105 03/21/2017 1624   CL 103 05/31/2013 0842   CO2 31 (H) 04/11/2017 1600   GLUCOSE 93 04/11/2017 1600   GLUCOSE 118 (H) 05/31/2013 0842   BUN 46.4 (H) 04/11/2017 1600   CREATININE 1.8 (H) 04/11/2017 1600   CALCIUM 9.5 04/11/2017 1600   PROT 7.4 04/11/2017 1600   ALBUMIN 3.6 04/11/2017 1600   AST 17 04/11/2017 1600   ALT 11 04/11/2017 1600   ALKPHOS 120 04/11/2017 1600   BILITOT 0.98 04/11/2017 1600   GFRNONAA 40 (L) 02/07/2016 0739   GFRNONAA 63 06/10/2014 1221   GFRAA 46 (L) 02/07/2016 0739   GFRAA 73 06/10/2014 1221    No results found for: SPEP, UPEP  Lab Results  Component Value Date   WBC 5.5 04/11/2017   NEUTROABS 1.6 04/11/2017   HGB 9.2 (L) 04/11/2017   HCT 29.7 (L) 04/11/2017   MCV 73.2 (L) 04/11/2017   PLT 355 04/11/2017      Chemistry      Component Value Date/Time   NA 142  04/11/2017 1600   K 3.8 04/11/2017 1600   CL 105 03/21/2017 1624   CL 103 05/31/2013 0842   CO2 31 (H) 04/11/2017 1600   BUN 46.4 (H) 04/11/2017 1600   CREATININE 1.8 (H) 04/11/2017 1600      Component Value Date/Time   CALCIUM 9.5 04/11/2017 1600   ALKPHOS 120 04/11/2017 1600   AST 17 04/11/2017 1600   ALT 11 04/11/2017 1600   BILITOT 0.98 04/11/2017 1600       RADIOGRAPHIC STUDIES: I have personally reviewed the radiological images as listed and agreed with the findings in the report. Dg Chest 2 View  Result Date: 03/30/2017 CLINICAL DATA:  Elevated D-dimer. History of atrial fibrillation. Shortness of breath for the past year, chronic CHF, nonsmoker. EXAM: CHEST  2 VIEW COMPARISON:  Chest x-ray of December 21, 2016 FINDINGS: The lungs remain mildly hyperinflated. There remains a small right pleural effusion and trace left pleural effusion. The interstitial markings remain increased. There is linear scarring in the periphery of the left lower lung. The cardiac silhouette is enlarged. The central pulmonary vascularity is prominent  but there is less cephalization than on the previous study. There is calcification in the wall of the aortic arch. The bony structures exhibit no acute abnormalities. IMPRESSION: COPD. CHF with mild interstitial edema and small bilateral pleural effusions. No acute pneumonia. Thoracic aortic atherosclerosis. Electronically Signed   By: David  Martinique M.D.   On: 03/30/2017 15:09   Nm Pulmonary Perf And Vent  Result Date: 03/30/2017 CLINICAL DATA:  Elevated D-dimer. EXAM: NUCLEAR MEDICINE VENTILATION - PERFUSION LUNG SCAN TECHNIQUE: Ventilation images were obtained in multiple projections using inhaled aerosol Tc-47mDTPA. Perfusion images were obtained in multiple projections after intravenous injection of Tc-984mAA. RADIOPHARMACEUTICALS:  Thirty-three mCi Technetium-9968mPA aerosol inhalation and 3.9 mCi Technetium-46m56m IV COMPARISON:  Radiograph 03/30/2017  FINDINGS: Ventilation: No focal ventilation defect. Perfusion: No wedge shaped peripheral perfusion defects to suggest acute pulmonary embolism. IMPRESSION: No evidence acute pulmonary embolism. Electronically Signed   By: StewSuzy Bouchard.   On: 03/30/2017 16:35    ASSESSMENT & PLAN:  Amyloidosis (HCC)[place] patient is absolutely convinced that the root of all his problems are due to amyloidosis and he cannot wait to get started on treatment. I told him the cause of his renal failure and his congestive heart failure are multifactorial. In the presence of his wife, we discussed the timing of treatment, treatment options and others. Again, the patient is very impatient during the discussion. [facility] He wanted to be treated as aggressive as possible and as soon as possible. In my mind, the patient lacked the understanding of the disease process and has no regard to potential side effects of treatment I will not recommend more than 4 mg  of dexamethasone weekly due to prior history of fluid retention, uncontrolled hypertension and diabetes with high-dose dexamethasone. Given his other comorbidities, I recommend weekly Velcade and to monitor for side effects closely. The risks, benefits, side effects of treatment were fully discussed with him and his wife and he agreed to proceed. The patient understood that treatment is not curable. The patient is placed on acyclovir for antimicrobial prophylaxis.  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  Observe only  Chronic diastolic heart failure (HCC)[place] had chronic bilateral lower extremity edema and follows with cardiologist closely. We  discussed other treatment options including compression hose to help reduce leg swelling but the patient declined  Goals of care, counseling/discussion The patient is aware he has incurable disease and treatment is strictly palliative. We discussed importance of Advanced Directives and Living will.   Orders Placed This Encounter  Procedures  . CBC with Differential    Standing Status:   Standing    Number of Occurrences:   20    Standing Expiration Date:   04/26/2018  . Comprehensive metabolic panel    Standing Status:   Standing    Number of Occurrences:   20    Standing Expiration Date:   04/26/2018   All questions were answered. The patient knows to call the clinic with any problems, questions or concerns. No barriers to learning was detected. I spent 40 minutes counseling the patient face to face. The total time spent in the appointment was 55 minutes and more than 50% was  on counseling and review of test results     Heath Lark, MD 04/26/2017 1:33 PM

## 2017-04-26 NOTE — Telephone Encounter (Signed)
Per Medicare guidelines, Pt must be seen within 90 days for home health aide/equipment. Last OV w/ PCP 12/2016. Unfortunately will need appt either way.

## 2017-04-26 NOTE — Assessment & Plan Note (Addendum)
The patient is absolutely convinced that the root of all his problems are due to amyloidosis and he cannot wait to get started on treatment. I told him the cause of his renal failure and his congestive heart failure are multifactorial. In the presence of his wife, we discussed the timing of treatment, treatment options and others. Again, the patient is very impatient during the discussion. He does not want to return to [facility] He declines further workup He wanted to be treated as aggressive as possible and as soon as possible. In my mind, the patient lacked the understanding of the disease process and has no regard to potential side effects of treatment I will not recommend more than 4 mg  of dexamethasone weekly due to prior history of fluid retention, uncontrolled hypertension and diabetes with high-dose dexamethasone. Given his other comorbidities, I recommend weekly Velcade and to monitor for side effects closely. The risks, benefits, side effects of treatment were fully discussed with him and his wife and he agreed to proceed. The patient understood that treatment is not curable. The patient is placed on acyclovir for antimicrobial prophylaxis.

## 2017-04-26 NOTE — Assessment & Plan Note (Signed)
He had recent acute on chronic renal failure secondary to diuretics He will continue close blood, monitoring during treatment No dose adjustment is needed for Velcade

## 2017-04-29 ENCOUNTER — Telehealth: Payer: Self-pay | Admitting: Cardiology

## 2017-04-29 ENCOUNTER — Telehealth: Payer: Self-pay | Admitting: Internal Medicine

## 2017-04-29 DIAGNOSIS — S0101XA Laceration without foreign body of scalp, initial encounter: Secondary | ICD-10-CM | POA: Diagnosis not present

## 2017-04-29 DIAGNOSIS — Z5189 Encounter for other specified aftercare: Secondary | ICD-10-CM | POA: Diagnosis not present

## 2017-04-29 DIAGNOSIS — D649 Anemia, unspecified: Secondary | ICD-10-CM | POA: Diagnosis not present

## 2017-04-29 DIAGNOSIS — S0990XA Unspecified injury of head, initial encounter: Secondary | ICD-10-CM | POA: Diagnosis not present

## 2017-04-29 DIAGNOSIS — T148XXA Other injury of unspecified body region, initial encounter: Secondary | ICD-10-CM | POA: Diagnosis not present

## 2017-04-29 DIAGNOSIS — L0390 Cellulitis, unspecified: Secondary | ICD-10-CM | POA: Diagnosis not present

## 2017-04-29 DIAGNOSIS — W19XXXD Unspecified fall, subsequent encounter: Secondary | ICD-10-CM | POA: Diagnosis not present

## 2017-04-29 DIAGNOSIS — R600 Localized edema: Secondary | ICD-10-CM | POA: Diagnosis not present

## 2017-04-29 DIAGNOSIS — M79671 Pain in right foot: Secondary | ICD-10-CM | POA: Diagnosis not present

## 2017-04-29 NOTE — Telephone Encounter (Signed)
Okay with me 

## 2017-04-29 NOTE — Telephone Encounter (Signed)
New Message  Pt c/o BP issue: STAT if pt c/o blurred vision, one-sided weakness or slurred speech  1. What are your last 5 BP readings? 86/44  2. Are you having any other symptoms (ex. Dizziness, headache, blurred vision, passed out)? Dizziness  3. What is your BP issue? Pt voiced he had a fall and his bp was low and wanted to speak with nurse.  Please f/u

## 2017-04-29 NOTE — Telephone Encounter (Signed)
Left message of dr crenshaw's recommendations. 

## 2017-04-29 NOTE — Telephone Encounter (Signed)
Wife called wanting to know if the Pt can transfer from Dr. Larose Kells to Dr. Camila Li.  Wife is a Plot Pt.

## 2017-04-29 NOTE — Telephone Encounter (Signed)
Ok Thx 

## 2017-04-29 NOTE — Telephone Encounter (Signed)
S/w pt/wife she states that pt fell 04-27-17@4am  and hit his head and cut his head and cut his right leg, he went to [facility] 04-26-17 and the placed staples and dessing to leg wound.  pt's BP is 86/44 HR 73 @ [facility] today for follow up. He is going back for staple removal in a few days he just wanted to let Dr know of the fall and the BP and the swelling. Pt currently taking lasix 40mg  BID and states that he is a cancer pt and has had kidney issues and they do not want him to increase lasix. Pt is taking all other medications as ordered, denies any SOB just a little dizzy denies any other symptoms. Any suggestions for increasing lasix, etc... Please advise

## 2017-04-29 NOTE — Telephone Encounter (Signed)
Continue present dose of lasix for now and monitor BP [name]

## 2017-04-30 ENCOUNTER — Emergency Department (HOSPITAL_COMMUNITY): Payer: Medicare Other

## 2017-04-30 ENCOUNTER — Emergency Department (HOSPITAL_COMMUNITY)
Admission: EM | Admit: 2017-04-30 | Discharge: 2017-04-30 | Disposition: A | Discharge status: Home / Self Care | Payer: Medicare Other

## 2017-04-30 ENCOUNTER — Other Ambulatory Visit (HOSPITAL_COMMUNITY)
Admission: RE | Admit: 2017-04-30 | Discharge: 2017-04-30 | Disposition: A | Discharge status: Home / Self Care | Payer: Medicare Other | Source: Ambulatory Visit | Attending: Cardiovascular Disease | Admitting: Cardiovascular Disease

## 2017-04-30 ENCOUNTER — Telehealth: Payer: Self-pay | Admitting: Cardiovascular Disease

## 2017-04-30 ENCOUNTER — Emergency Department (HOSPITAL_COMMUNITY)
Admission: EM | Admit: 2017-04-30 | Discharge: 2017-04-30 | Disposition: A | Discharge status: Home / Self Care | Payer: Medicare Other | Attending: Emergency Medicine | Admitting: Emergency Medicine

## 2017-04-30 ENCOUNTER — Encounter (HOSPITAL_COMMUNITY): Payer: Self-pay | Admitting: Oncology

## 2017-04-30 DIAGNOSIS — R0602 Shortness of breath: Secondary | ICD-10-CM | POA: Insufficient documentation

## 2017-04-30 DIAGNOSIS — D649 Anemia, unspecified: Secondary | ICD-10-CM | POA: Insufficient documentation

## 2017-04-30 DIAGNOSIS — W1830XD Fall on same level, unspecified, subsequent encounter: Secondary | ICD-10-CM | POA: Insufficient documentation

## 2017-04-30 DIAGNOSIS — Z79899 Other long term (current) drug therapy: Secondary | ICD-10-CM | POA: Diagnosis not present

## 2017-04-30 DIAGNOSIS — I5033 Acute on chronic diastolic (congestive) heart failure: Secondary | ICD-10-CM | POA: Diagnosis not present

## 2017-04-30 DIAGNOSIS — I509 Heart failure, unspecified: Secondary | ICD-10-CM | POA: Diagnosis not present

## 2017-04-30 DIAGNOSIS — W19XXXD Unspecified fall, subsequent encounter: Secondary | ICD-10-CM

## 2017-04-30 DIAGNOSIS — I130 Hypertensive heart and chronic kidney disease with heart failure and stage 1 through stage 4 chronic kidney disease, or unspecified chronic kidney disease: Secondary | ICD-10-CM | POA: Diagnosis not present

## 2017-04-30 DIAGNOSIS — N183 Chronic kidney disease, stage 3 (moderate): Secondary | ICD-10-CM | POA: Insufficient documentation

## 2017-04-30 DIAGNOSIS — S0990XD Unspecified injury of head, subsequent encounter: Secondary | ICD-10-CM | POA: Diagnosis present

## 2017-04-30 DIAGNOSIS — S0003XD Contusion of scalp, subsequent encounter: Secondary | ICD-10-CM | POA: Insufficient documentation

## 2017-04-30 DIAGNOSIS — R220 Localized swelling, mass and lump, head: Secondary | ICD-10-CM | POA: Diagnosis not present

## 2017-04-30 DIAGNOSIS — Z7982 Long term (current) use of aspirin: Secondary | ICD-10-CM | POA: Insufficient documentation

## 2017-04-30 LAB — BASIC METABOLIC PANEL
ANION GAP: 9 (ref 5–15)
BUN: 37 mg/dL — ABNORMAL HIGH (ref 6–20)
CO2: 27 mmol/L (ref 22–32)
Calcium: 8.5 mg/dL — ABNORMAL LOW (ref 8.9–10.3)
Chloride: 102 mmol/L (ref 101–111)
Creatinine, Ser: 2.23 mg/dL — ABNORMAL HIGH (ref 0.61–1.24)
GFR, EST AFRICAN AMERICAN: 31 mL/min — AB (ref >60)
GFR, EST NON AFRICAN AMERICAN: 27 mL/min — AB (ref >60)
GLUCOSE: 93 mg/dL (ref 65–99)
POTASSIUM: 3.6 mmol/L (ref 3.5–5.1)
Sodium: 138 mmol/L (ref 135–145)

## 2017-04-30 LAB — CBC
HEMATOCRIT: 23.3 % — AB (ref 39.0–52.0)
HEMOGLOBIN: 7.3 g/dL — AB (ref 13.0–17.0)
MCH: 22.7 pg — ABNORMAL LOW (ref 26.0–34.0)
MCHC: 31.3 g/dL (ref 30.0–36.0)
MCV: 72.4 fL — ABNORMAL LOW (ref 78.0–100.0)
Platelets: 255 10*3/uL (ref 150–400)
RBC: 3.22 MIL/uL — AB (ref 4.22–5.81)
RDW: 33.5 % — ABNORMAL HIGH (ref 11.5–15.5)
WBC: 4.4 10*3/uL (ref 4.0–10.5)

## 2017-04-30 LAB — BRAIN NATRIURETIC PEPTIDE: B NATRIURETIC PEPTIDE 5: 293 pg/mL — AB (ref 0.0–100.0)

## 2017-04-30 LAB — TSH: TSH: 5.087 u[IU]/mL — ABNORMAL HIGH (ref 0.350–4.500)

## 2017-04-30 NOTE — Discharge Instructions (Signed)
Get rechecked immediately if you develop bleeding, difficulty breathing, chest pain or new concerning symptoms.

## 2017-04-30 NOTE — Telephone Encounter (Signed)
Mr. Ringel called to report that his Hgb level returned at 7.2 which I just confirmed via EPIC is a 2 gram drop from prior.  He is actively short of breath and so I advised him to present to the ED for evaluation.  He agreed to this plan and will come to the ED this evening.  Clayborne Dana MD

## 2017-04-30 NOTE — ED Provider Notes (Signed)
[facility] DEPT Provider Note   CSN: [id number] Arrival date & time: 04/30/17  2050     History   Chief Complaint Chief Complaint  Patient presents with  . Abnormal Labs    HPI Johnny Mitchell is a 76 y.o. male.  The history is provided by the patient. No language interpreter was used.   Johnny Mitchell is a 76 y.o. male who presents to the Emergency Department complaining of abnormal labs.  He presents to the emergency department for a low hemoglobin of 7.3 today on outpatient blood draw. He is ago he rolled out of bed and fell onto the ground, striking his face and legs. He had a lot of bleeding at that time was seen at urgent care and had sutures performed. Yesterday he had a blood draw at that urgent care that showed a hemoglobin of 7.5. He has not had any additional bleeding since the fall but did have a significant amount of time. He states overall he has his baseline shortness of breath that is unchanged. No fevers, nausea, vomiting. He has chronic lower extremity edema that is his baseline. No hematochezia or melena. No chest pain. Past Medical History:  Diagnosis Date  . Adenomatous colon polyp 2003   max size 72mm  . Amyloidosis    seen at [facility]  . Atrial fibrillation ([place])   . Benign neoplasm of colon   . Cancer (HCC)    amyloidosis  . Cerebrovascular disease, unspecified   . Chronic venous insufficiency 2016  . Coronary atherosclerosis of unspecified type of vessel, native or graft    Non obstructive  . Cough, persistent 09/29/2015  . DDD (degenerative disc disease), lumbar    Dr. Katherine Roan  . Degenerative disc disease   . Diverticulosis of colon (without mention of hemorrhage)   . Edema 08/29/2013  . Hemangioma   . Hx of cardiovascular stress test    Adenosine Myoview (02/2014): Apical thinning, no ischemia, EF 48%; low risk.  Marland Kitchen Hypertension   . LBBB (left bundle branch block)   . Low back pain    chronic, seeing pain management  . Non-pressure  chronic ulcer left lower leg, limited to breakdown skin ([place])    Seen at [facility] on 05/29/2015  . OSA (obstructive sleep apnea)   . Pure hypercholesterolemia   . Scrotal pain 10/23/2014  . Shortness of breath dyspnea   . 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 Traumatic open wound of left lower leg with delayed healing    Seen at [facility] on 05/29/2015  . Unspecified hemorrhoids without mention of complication   . Wide-complex tachycardia ([place]) 01/2011   Felt likely be SVT with abbarancy    Patient Active Problem List   Diagnosis Date Noted  . Goals of care, counseling/discussion 04/26/2017  . Iron deficiency anemia 04/11/2017  . Annual physical exam 01/12/2017  . Paroxysmal atrial fibrillation (HCC)   . Acute on chronic diastolic CHF (congestive heart failure), NYHA class 1 ([place])   . Uncomplicated alcohol dependence ([place])   . Syncope and collapse 02/05/2016  . Normocytic normochromic anemia 02/05/2016  . Persistent atrial fibrillation ([place]) 02/05/2016  . Syncope 02/05/2016  . Renal failure (ARF), acute on chronic (HCC) 02/05/2016  . PCP NOTES >>>>>>> 10/28/2015  . Cough, persistent 09/29/2015  . Chronic kidney disease (CKD), stage III (moderate) 06/26/2015  . DDD (degenerative disc disease), lumbar 05/27/2015  . 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 Left leg pain 02/27/2015  . Chronic  venous insufficiency 02/20/2015  . Open wound of lower leg with complication 58/08/9832  . 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 Scrotal pain 10/23/2014  . Dyspnea on exertion 10/23/2014  . Acute on chronic diastolic congestive heart failure ([place]) 09/30/2014  . Edema 08/29/2013  . Osteopenia 08/29/2013  . Chronic diastolic heart failure ([place]) 06/01/2013  . Atrial fibrillation ([place]) 05/31/2013  . Hyperkalemia 05/31/2013  . Pedal edema 05/11/2012  . Obstructive sleep apnea 02/17/2011  . PAROXYSMAL SUPRAVENTRICULAR TACHYCARDIA 02/11/2011  . Amyloidosis ([place])  12/19/2009  . LEUKOCYTOSIS 11/14/2009  . DYSPNEA 10/20/2009  . CHEST PAIN 09/19/2009  . BLOOD IN STOOL 08/14/2009  . BACK PAIN, LUMBAR 01/13/2009  . HEMANGIOMA OF OTHER SITES 12/31/2007  . CEREBROVASCULAR DISEASE 12/31/2007  . SPONDYLOSIS, CERVICAL, WITHOUT MYELOPATHY 12/31/2007  . DIVERTICULOSIS OF COLON 12/27/2007  . COLONIC POLYPS 12/26/2007  . HYPERCHOLESTEROLEMIA 12/26/2007  . Obesity 12/26/2007  . Essential hypertension 12/26/2007  . Coronary atherosclerosis 12/26/2007  . HEMORRHOIDS 12/26/2007  . DEGENERATIVE JOINT DISEASE 12/26/2007    Past Surgical History:  Procedure Laterality Date  . BACK SURGERY  2012   Lumbar, [name] Kendrick  . 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 CARDIOVERSION N/A 07/10/2013   Procedure: CARDIOVERSION;  Surgeon: Lelon Perla, MD;  Location[facility] ENDOSCOPY;  Service: Cardiovascular;  Laterality: N/A;  . CARDIOVERSION N/A 02/06/2016   Procedure: CARDIOVERSION;  Surgeon: Skeet Latch, MD;  Location: [facility];  Service: Cardiovascular;  Laterality: N/A;  . CARPAL TUNNEL RELEASE  08/2006   Dr Doy Mince at [place]    . KNEE ARTHROSCOPY    . Lumbar Medial Branch Block  2017   left L2-5, S1 and right L2-5 and S1 medial branches       Home Medications    Prior to Admission medications   Medication Sig Start Date End Date Taking? Authorizing Provider  acyclovir (ZOVIRAX) 400 MG tablet Take 1 tablet (400 mg total) by mouth daily. 04/25/17  Yes Gorsuch, Ni, MD  amiodarone (PACERONE) 200 MG tablet Take 1 tablet (200 mg total) by mouth daily. 03/21/17  Yes Lelon Perla, MD  aspirin 81 MG chewable tablet Chew by mouth.   Yes [provider]  aspirin EC 81 MG tablet Take 1 tablet (81 mg total) by mouth daily. 01/06/17  Yes Lelon Perla, MD  bacitracin 500 UNIT/GM ointment Apply 1 application topically 2 (two) times daily.  04/26/17 05/06/17 Yes [provider]  cephALEXin (KEFLEX) 500 MG  capsule Take 500 mg by mouth 3 (three) times daily.  04/26/17 05/06/17 Yes [provider]  furosemide (LASIX) 40 MG tablet Take 1 tablet (40 mg total) by mouth daily. 04/20/17 07/19/17 Yes Lelon Perla, MD  pravastatin (PRAVACHOL) 40 MG tablet Take 1 tablet (40 mg total) by mouth every evening. 03/21/17 06/19/17 Yes Lelon Perla, MD  dexamethasone (DECADRON) 4 MG tablet Take 1 tablet (4 mg total) by mouth daily. 04/25/17   Heath Lark, MD  ondansetron (ZOFRAN) 8 MG tablet Take 1 tablet (8 mg total) by mouth 2 (two) times daily as needed (Nausea or vomiting). 04/25/17   Heath Lark, MD  prochlorperazine (COMPAZINE) 10 MG tablet Take 1 tablet (10 mg total) by mouth every 6 (six) hours as needed (Nausea or vomiting). 04/25/17   Heath Lark, MD    Family History Family History  Problem Relation Age of Onset  . Lung cancer Mother   . Skin cancer Father  Melanoma  . Colon cancer Neg Hx   . Colon polyps Neg Hx   . Kidney disease Neg Hx   . Esophageal cancer Neg Hx   . Diabetes Neg Hx   . Gallbladder disease Neg Hx     Social History Social History  Substance Use Topics  . Smoking status: Never Smoker  . Smokeless tobacco: Never Used  . Alcohol use 0.0 oz/week     Comment: Occassionally     Allergies   Patient has no known allergies.   Review of Systems Review of Systems  All other systems reviewed and are negative.    Physical Exam Updated Vital Signs BP 127/67   Pulse 73   Temp 98.2 F (36.8 C) (Oral)   Resp 18   Ht 5\' 11"  (1.803 m)   Wt 247 lb (112 kg)   SpO2 100%   BMI 34.45 kg/m   Physical Exam  Constitutional: He is oriented to person, place, and time. He appears well-developed and well-nourished.  HENT:  Head: Normocephalic.  Healing laceration to the vertex scalp staples in place. Multiple ecchymoses over the scalp and periorbitally bilaterally  Cardiovascular: Normal rate and regular rhythm.   No murmur heard. Pulmonary/Chest: Effort normal  and breath sounds normal. No respiratory distress.  Abdominal: Soft. There is no tenderness. There is no rebound and no guarding.  Genitourinary:  Genitourinary Comments: Nontender rectal exam with brown stool, no gross blood.  Musculoskeletal:  2+ pitting edema to bilateral lower extremities, slightly greater on the right compared to left. There is a healing skin tear to the left anterior shin.  Neurological: He is alert and oriented to person, place, and time.  Skin: Skin is warm and dry.  5 out of 5 strength in all 4 extremities  Psychiatric: He has a normal mood and affect. His behavior is normal.  Nursing note and vitals reviewed.    ED Treatments / Results  Labs (all labs ordered are listed, but only abnormal results are displayed) Labs Reviewed - No data to display  EKG  EKG Interpretation None       Radiology Dg Chest 2 View  Result Date: 04/30/2017 CLINICAL DATA:  Fall a few days ago. EXAM: CHEST  2 VIEW COMPARISON:  Radiographs 03/30/2017 FINDINGS: Stable cardiomegaly. Unchanged pulmonary edema. Small pleural effusions, with possible decreased on the left. Bibasilar linear atelectasis or scarring. No confluent airspace disease. No pneumothorax. No evidence of displaced rib fracture. IMPRESSION: Stable cardiomegaly, pulmonary edema and pleural effusions, consistent with CHF. No evidence of superimposed acute traumatic injury. Electronically Signed   By: Jeb Levering M.D.   On: 04/30/2017 22:41   Ct Head Wo Contrast  Result Date: 04/30/2017 CLINICAL DATA:  Patient fell last Tuesday. EXAM: CT HEAD WITHOUT CONTRAST TECHNIQUE: Contiguous axial images were obtained from the base of the skull through the vertex without intravenous contrast. COMPARISON:  None. FINDINGS: Brain: Chronic stable superficial and central atrophy with moderate degree of subcortical and periventricular white matter chronic small vessel ischemia. No large vascular territory infarction, hemorrhage or  midline shift. No intra-axial mass nor extra-axial fluid collections. The brainstem and fourth ventricle are within normal limits. No hydrocephalus. Vascular: Moderate carotid siphon calcifications. No hyperdense vessels. Skull: No skull fracture or suspicious lesions. Sinuses/Orbits: No acute finding. Other: Mild soft tissue swelling of the forehead. IMPRESSION: Mild soft tissue swelling of the forehead. No skull fracture. Chronic stable cerebral atrophy with moderate degree of small vessel ischemic change of the periventricular and subcortical  white matter. Electronically Signed   By: Ashley Royalty M.D.   On: 04/30/2017 22:50    Procedures Procedures (including critical care time)  Medications Ordered in ED Medications - No data to display   Initial Impression / Assessment and Plan / ED Course  I have reviewed the triage vital signs and the nursing notes.  Pertinent labs & imaging results that were available during my care of the patient were reviewed by me and considered in my medical decision making (see chart for details).     Patient here for abnormal lab draw on routine lab draw. Hemoglobin of 7.3. Hemoglobin is stable when compared to yesterday. Patient had an acute bleeding event 3 days ago when he had his fall, no active bleeding on examination and no recurrent bleeding since his fall. Plan to d/c home with close outpatient follow up and return precautions.   Final Clinical Impressions(s) / ED Diagnoses   Final diagnoses:  Fall, subsequent encounter  Anemia, unspecified type    New Prescriptions Discharge Medication List as of 04/30/2017 11:21 PM       Quintella Reichert, MD 04/30/17 2357

## 2017-04-30 NOTE — ED Triage Notes (Addendum)
Pt called to ED d/t low HgB.  Pt had a fall last Tuesday and stated that he had been evaluated.  Pt states that there was a copious amount of blood on the floor after the fall.  Pt stated, "We were swimming in blood."  Pt has not had follow up since that time.  He did have a blood draw today that indicated HgB of 7.3.

## 2017-05-02 ENCOUNTER — Telehealth: Payer: Self-pay | Admitting: Hematology and Oncology

## 2017-05-02 ENCOUNTER — Observation Stay (HOSPITAL_COMMUNITY)
Admission: AD | Admit: 2017-05-02 | Discharge: 2017-05-02 | Disposition: A | Discharge status: Home / Self Care | Payer: Medicare Other | Source: Ambulatory Visit | Attending: Hematology and Oncology | Admitting: Hematology and Oncology

## 2017-05-02 ENCOUNTER — Encounter: Payer: Self-pay | Admitting: Hematology and Oncology

## 2017-05-02 ENCOUNTER — Ambulatory Visit (HOSPITAL_BASED_OUTPATIENT_CLINIC_OR_DEPARTMENT_OTHER): Payer: Medicare Other

## 2017-05-02 ENCOUNTER — Encounter: Payer: Medicare Other | Admitting: Hematology and Oncology

## 2017-05-02 ENCOUNTER — Telehealth: Payer: Self-pay

## 2017-05-02 ENCOUNTER — Other Ambulatory Visit: Payer: Self-pay | Admitting: Hematology and Oncology

## 2017-05-02 ENCOUNTER — Encounter: Payer: Self-pay | Admitting: Cardiology

## 2017-05-02 ENCOUNTER — Encounter (HOSPITAL_COMMUNITY): Payer: Self-pay

## 2017-05-02 ENCOUNTER — Ambulatory Visit (HOSPITAL_COMMUNITY)
Admission: RE | Admit: 2017-05-02 | Discharge: 2017-05-02 | Disposition: A | Discharge status: Home / Self Care | Payer: Medicare Other | Source: Ambulatory Visit | Attending: Hematology and Oncology | Admitting: Hematology and Oncology

## 2017-05-02 VITALS — BP 132/59 | HR 82 | Temp 98.1°F | Resp 17 | Ht 71.0 in | Wt 250.0 lb

## 2017-05-02 DIAGNOSIS — E859 Amyloidosis, unspecified: Secondary | ICD-10-CM | POA: Diagnosis not present

## 2017-05-02 DIAGNOSIS — I5032 Chronic diastolic (congestive) heart failure: Secondary | ICD-10-CM | POA: Diagnosis present

## 2017-05-02 DIAGNOSIS — D630 Anemia in neoplastic disease: Secondary | ICD-10-CM | POA: Insufficient documentation

## 2017-05-02 DIAGNOSIS — D500 Iron deficiency anemia secondary to blood loss (chronic): Secondary | ICD-10-CM | POA: Diagnosis not present

## 2017-05-02 DIAGNOSIS — N183 Chronic kidney disease, stage 3 unspecified: Secondary | ICD-10-CM | POA: Diagnosis present

## 2017-05-02 DIAGNOSIS — I2510 Atherosclerotic heart disease of native coronary artery without angina pectoris: Secondary | ICD-10-CM | POA: Insufficient documentation

## 2017-05-02 DIAGNOSIS — Z8673 Personal history of transient ischemic attack (TIA), and cerebral infarction without residual deficits: Secondary | ICD-10-CM | POA: Insufficient documentation

## 2017-05-02 DIAGNOSIS — I130 Hypertensive heart and chronic kidney disease with heart failure and stage 1 through stage 4 chronic kidney disease, or unspecified chronic kidney disease: Secondary | ICD-10-CM | POA: Diagnosis not present

## 2017-05-02 DIAGNOSIS — D4989 Neoplasm of unspecified behavior of other specified sites: Secondary | ICD-10-CM | POA: Diagnosis not present

## 2017-05-02 DIAGNOSIS — D539 Nutritional anemia, unspecified: Secondary | ICD-10-CM | POA: Diagnosis present

## 2017-05-02 LAB — CBC WITH DIFFERENTIAL/PLATELET
BASO%: 1.3 % (ref 0.0–2.0)
BASOS ABS: 0.1 10*3/uL (ref 0.0–0.1)
EOS ABS: 0.1 10*3/uL (ref 0.0–0.5)
EOS%: 2.8 % (ref 0.0–7.0)
HEMATOCRIT: 23.9 % — AB (ref 38.4–49.9)
HGB: 7.3 g/dL — ABNORMAL LOW (ref 13.0–17.1)
LYMPH%: 32.9 % (ref 14.0–49.0)
MCH: 22.3 pg — AB (ref 27.2–33.4)
MCHC: 30.5 g/dL — ABNORMAL LOW (ref 32.0–36.0)
MCV: 72.9 fL — ABNORMAL LOW (ref 79.3–98.0)
MONO#: 0.6 10*3/uL (ref 0.1–0.9)
MONO%: 13.5 % (ref 0.0–14.0)
NEUT%: 49.5 % (ref 39.0–75.0)
NEUTROS ABS: 2.3 10*3/uL (ref 1.5–6.5)
Platelets: 282 10*3/uL (ref 140–400)
RBC: 3.28 10*6/uL — AB (ref 4.20–5.82)
RDW: 33.7 % — ABNORMAL HIGH (ref 11.0–14.6)
WBC: 4.7 10*3/uL (ref 4.0–10.3)
lymph#: 1.5 10*3/uL (ref 0.9–3.3)
nRBC: 2 % — ABNORMAL HIGH (ref 0–0)

## 2017-05-02 LAB — COMPREHENSIVE METABOLIC PANEL
ALT: 13 U/L (ref 0–55)
AST: 17 U/L (ref 5–34)
Albumin: 3.2 g/dL — ABNORMAL LOW (ref 3.5–5.0)
Alkaline Phosphatase: 107 U/L (ref 40–150)
Anion Gap: 14 mEq/L — ABNORMAL HIGH (ref 3–11)
BUN: 42.3 mg/dL — AB (ref 7.0–26.0)
CO2: 26 mEq/L (ref 22–29)
CREATININE: 2.4 mg/dL — AB (ref 0.7–1.3)
Calcium: 9.3 mg/dL (ref 8.4–10.4)
Chloride: 102 mEq/L (ref 98–109)
EGFR: 26 mL/min/{1.73_m2} — ABNORMAL LOW (ref >90)
GLUCOSE: 117 mg/dL (ref 70–140)
POTASSIUM: 4.4 meq/L (ref 3.5–5.1)
SODIUM: 142 meq/L (ref 136–145)
Total Bilirubin: 1.06 mg/dL (ref 0.20–1.20)
Total Protein: 7.1 g/dL (ref 6.4–8.3)

## 2017-05-02 LAB — PREPARE RBC (CROSSMATCH)

## 2017-05-02 LAB — TECHNOLOGIST REVIEW

## 2017-05-02 LAB — ABO/RH: ABO/RH(D): O POS

## 2017-05-02 MED ORDER — SODIUM CHLORIDE 0.9 % IV SOLN
8.0000 mg | Freq: Three times a day (TID) | INTRAVENOUS | Status: DC | PRN
  Filled 2017-05-02: qty 4

## 2017-05-02 MED ORDER — PRAVASTATIN SODIUM 20 MG PO TABS: 40.0000 mg | ORAL_TABLET | Freq: Every evening | ORAL | Status: DC

## 2017-05-02 MED ORDER — SODIUM CHLORIDE 0.9 % IV SOLN
250.0000 mL | Freq: Once | INTRAVENOUS | Status: AC
  Administered 2017-05-02: 250 mL via INTRAVENOUS

## 2017-05-02 MED ORDER — ONDANSETRON HCL 4 MG/2ML IJ SOLN: 4.0000 mg | Freq: Three times a day (TID) | INTRAMUSCULAR | Status: DC | PRN

## 2017-05-02 MED ORDER — HEPARIN SOD (PORK) LOCK FLUSH 100 UNIT/ML IV SOLN: 250.0000 [IU] | INTRAVENOUS | Status: DC | PRN

## 2017-05-02 MED ORDER — ACETAMINOPHEN 325 MG PO TABS: 650.0000 mg | ORAL_TABLET | ORAL | Status: DC | PRN

## 2017-05-02 MED ORDER — CEPHALEXIN 500 MG PO CAPS
500.0000 mg | ORAL_CAPSULE | Freq: Three times a day (TID) | ORAL | Status: DC
  Administered 2017-05-02 (×2): 500 mg via ORAL
  Filled 2017-05-02 (×2): qty 1

## 2017-05-02 MED ORDER — ONDANSETRON 4 MG PO TBDP: 4.0000 mg | ORAL_TABLET | Freq: Three times a day (TID) | ORAL | Status: DC | PRN

## 2017-05-02 MED ORDER — AMIODARONE HCL 200 MG PO TABS: 200.0000 mg | ORAL_TABLET | Freq: Every day | ORAL | Status: DC

## 2017-05-02 MED ORDER — HEPARIN SOD (PORK) LOCK FLUSH 100 UNIT/ML IV SOLN
500.0000 [IU] | Freq: Every day | INTRAVENOUS | Status: DC | PRN
  Filled 2017-05-02: qty 5

## 2017-05-02 MED ORDER — SODIUM CHLORIDE 0.9% FLUSH: 3.0000 mL | INTRAVENOUS | Status: DC | PRN

## 2017-05-02 MED ORDER — SENNOSIDES-DOCUSATE SODIUM 8.6-50 MG PO TABS: 1.0000 | ORAL_TABLET | Freq: Every evening | ORAL | Status: DC | PRN

## 2017-05-02 MED ORDER — SODIUM CHLORIDE 0.9% FLUSH: 10.0000 mL | INTRAVENOUS | Status: DC | PRN

## 2017-05-02 MED ORDER — DIPHENHYDRAMINE HCL 25 MG PO CAPS
25.0000 mg | ORAL_CAPSULE | Freq: Once | ORAL | Status: AC
Start: 2017-05-02 — End: 2017-05-02
  Administered 2017-05-02: 25 mg via ORAL
  Filled 2017-05-02: qty 1

## 2017-05-02 MED ORDER — ACETAMINOPHEN 325 MG PO TABS
650.0000 mg | ORAL_TABLET | Freq: Once | ORAL | Status: AC
  Administered 2017-05-02: 650 mg via ORAL
  Filled 2017-05-02: qty 2

## 2017-05-02 MED ORDER — ACYCLOVIR 400 MG PO TABS: 400.0000 mg | ORAL_TABLET | Freq: Every day | ORAL | Status: DC

## 2017-05-02 MED ORDER — ONDANSETRON HCL 4 MG PO TABS: 4.0000 mg | ORAL_TABLET | Freq: Three times a day (TID) | ORAL | Status: DC | PRN

## 2017-05-02 MED ORDER — FUROSEMIDE 10 MG/ML IJ SOLN
20.0000 mg | INTRAMUSCULAR | Status: DC
  Administered 2017-05-02 (×2): 20 mg via INTRAVENOUS
  Filled 2017-05-02 (×2): qty 2

## 2017-05-02 NOTE — H&P (Signed)
[facility] ADMISSION NOTE  Patient Care Team: Lelon Perla, MD as PCP - General (Cardiology) [name] Denice Bors, MD (Cardiology) Heath Lark, MD as Consulting Physician (Hematology and Oncology) Myrle Sheng, MD as Referring Physician (Neurosurgery) Gillis Santa, MD as Referring Physician (Pain Medicine)  CHIEF COMPLAINTS/PURPOSE OF ADMISSION Severe anemia, congestive heart failure, chronic kidney failure, admitted for urgent blood transfusion  HISTORY OF PRESENTING ILLNESS:  Johnny Mitchell 76 y.o. male is admitted for management of multiple issues as above Summary of oncologic history as follows: He was diagnosed with systemic amyloidosis after presentation with shortness of breath. He has significant evaluation including bone marrow aspirate and biopsy. 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 The patient subsequently went for maintain dense chemotherapy with Velcade for almost 2 years and then switch to Revlimid. He self discontinue Revlimid in May of 2014 due to fatigue. In July 2015, we restarted him back on Velcade due to a rising light chain. In September 2015, the patient requested to add back dexamethasone at 20 mg every week. In November 2015, the patient reduce dexamethasone to 16 mg every week along with Velcade injection. We discontinued treatment on 11/20/2014  I was notified that the patient had recurrent visits to the emergency department this past weekend. Reportedly, he fell off his bed and hit his forehead requiring 7 stitches.  He had profuse bleeding from the wound. He was noted to be anemic with hemoglobin of 7.3, symptomatic but was not transfused in the emergency department He complained of profound fatigue and  shortness of breath with recent anemia. The patient denies any recent signs or symptoms of bleeding such as spontaneous epistaxis, hematuria or hematochezia. He has no chest pain today.  He has chronic bilateral lower extremity edema from congestive heart failure.  MEDICAL HISTORY:  Past Medical History:  Diagnosis Date  . Adenomatous colon polyp 2003   max size 71m  . [facility]    [facility] . [place]   . Benign neoplasm of colon   . Cancer (HCC)    amyloidosis  . Cerebrovascular disease, unspecified   . Chronic venous insufficiency 2016  . Coronary atherosclerosis of unspecified type of vessel, native or graft    Non obstructive  . Cough, persistent 09/29/2015  . DDD (degenerative disc disease), lumbar    Dr. KKatherine Roan . Degenerative disc disease   . Diverticulosis of colon (without mention of hemorrhage)   . Edema 08/29/2013  . Hemangioma   . Hx of cardiovascular stress test    Adenosine Myoview (02/2014): Apical thinning, no ischemia, EF 48%; low risk.  .Marland KitchenHypertension   . LBBB (left bundle branch block)   . Low back pain    chronic, seeing pain management  . Non-pressure chronic ulcer left lower leg, limited to breakdown skin ([place]    [facility]  . OSA (obstructive sleep apnea)   . Pure hypercholesterolemia   . Scrotal pain 10/23/2014  . Shortness of breath dyspnea   . 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 Traumatic open wound of left lower leg with delayed healing    [facility]  . Unspecified hemorrhoids without mention of complication   . Wide-complex tachycardia ([place] 01/2011   Felt  likely be SVT with abbarancy    SURGICAL HISTORY: Past Surgical History:  Procedure Laterality Date  . BACK SURGERY  2012   Lumbar, [name] Tamora  . 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 CARDIOVERSION N/A 07/10/2013   Procedure: CARDIOVERSION;  Surgeon: Lelon Perla, MD;  Location: [facility]  ENDOSCOPY;  Service: Cardiovascular;  Laterality: N/A;  . CARDIOVERSION N/A 02/06/2016   Procedure: CARDIOVERSION;  Surgeon: Skeet Latch, MD;  Location: [facility];  Service: Cardiovascular;  Laterality: N/A;  . CARPAL TUNNEL RELEASE  08/2006   Dr Doy Mince at [place]    . KNEE ARTHROSCOPY    . Lumbar Medial Branch Block  2017   left L2-5, S1 and right L2-5 and S1 medial branches    SOCIAL HISTORY: Social History   Social History  . Marital status: Married    Spouse name: N/A  . Number of children: 2  . Years of education: N/A   Occupational History  . PRESIDENT [employer]   Social History Main Topics  . Smoking status: Never Smoker  . Smokeless tobacco: Never Used  . Alcohol use 0.0 oz/week     Comment: Occassionally  . Drug use: No  . Sexual activity: No   Other Topics Concern  . Not on file   Social History Narrative   Lives with wife.      FAMILY HISTORY: Family History  Problem Relation Age of Onset  . Lung cancer Mother   . Skin cancer Father        Melanoma  . Colon cancer Neg Hx   . Colon polyps Neg Hx   . Kidney disease Neg Hx   . Esophageal cancer Neg Hx   . Diabetes Neg Hx   . Gallbladder disease Neg Hx     ALLERGIES:  has No Known Allergies.  MEDICATIONS:  No current facility-administered medications for this encounter.    Facility-Administered Medications Ordered in Other Encounters  Medication Dose Route Frequency Provider Last Rate Last Dose  . iopamidol (ISOVUE-M) 41 % intrathecal injection 16 mL  16 mL Intrathecal Once Kristeen Miss, MD        REVIEW OF SYSTEMS:   Constitutional: Denies fevers, chills or abnormal night sweats Eyes: Denies blurriness of vision, double vision or watery eyes Ears, nose, mouth, throat, and face: Denies mucositis or sore throat Respiratory: Denies cough, dyspnea or wheezes Cardiovascular: Denies palpitation, chest  discomfort Gastrointestinal:  Denies nausea, heartburn or change in bowel habits Skin: Denies abnormal skin rashes Lymphatics: Denies new lymphadenopathy or easy bruising Neurological:Denies numbness, tingling or new weaknesses Behavioral/Psych: Mood is stable, no new changes  All other systems were reviewed with the patient and are negative.  PHYSICAL EXAMINATION: ECOG PERFORMANCE STATUS: 2 - Symptomatic, <50% confined to bed He is morbidly obese Blood pressure 132/59 heart rate 82 respiratory rate 17 saturation 100% He had large wound on his forehead, stapled GENERAL:alert, no distress and comfortable SKIN: He had extensive bruises throughout EYES: normal, conjunctiva are pink and non-injected, sclera clear OROPHARYNX:no exudate, no erythema and lips, buccal mucosa, and tongue normal  NECK: supple, thyroid normal size, non-tender, without nodularity LYMPH:  no palpable lymphadenopathy in the cervical, axillary or inguinal LUNGS: clear to auscultation and percussion with normal breathing effort HEART: regular rate & rhythm and no murmurs with moderate bilateral lower extremity edema ABDOMEN:abdomen soft, non-tender and normal bowel sounds Musculoskeletal:no cyanosis of digits and no clubbing  PSYCH: alert & oriented x 3 with fluent speech NEURO: no focal motor/sensory deficits  LABORATORY DATA:  I have reviewed the data as listed Lab Results  Component Value Date   WBC 4.7 05/02/2017   HGB 7.3 (L) 05/02/2017   HCT 23.9 (L) 05/02/2017   MCV 72.9 (L) 05/02/2017   PLT 282 05/02/2017    Recent Labs  10/04/16 1331  01/24/17 1629 03/21/17 1624  04/04/17 1358 04/04/17 1358 04/11/17 1600 04/30/17 1140 05/02/17 0937  NA 142  < > 140 143  < > 140  --  142 138 142  K 4.6  < > 4.7 4.0  < > 4.2  --  3.8 3.6 4.4  CL 105  < > 102 105  --   --   --   --  102  --   CO2 26  < > 30 29  < > 32*  --  31* 27 26  GLUCOSE 101*  < > 117* 92  < > 103  --  93 93 117  BUN 25  < > 36* 31*  <  > 52.8*  --  46.4* 37* 42.3*  CREATININE 1.72*  < > 1.84* 1.82*  < > 2.7*  --  1.8* 2.23* 2.4*  CALCIUM 8.3*  < > 8.7 8.4*  < > 9.4  --  9.5 8.5* 9.3  GFRNONAA  --   --   --   --   --   --   --   --  27*  --   GFRAA  --   --   --   --   --   --   --   --  31*  --   PROT 5.9*  < >  --   --   --  7.4 6.9 7.4  --  7.1  ALBUMIN 3.2*  < >  --   --   --  3.4*  --  3.6  --  3.2*  AST 13  < >  --   --   --  16  --  17  --  17  ALT 8*  < >  --   --   --  8  --  11  --  13  ALKPHOS 73  < >  --   --   --  107  --  120  --  107  BILITOT 1.1  < >  --   --   --  1.35*  --  0.98  --  1.06  BILIDIR 0.3*  --   --   --   --   --   --   --   --   --   IBILI 0.8  --   --   --   --   --   --   --   --   --   < > = values in this interval not displayed.  RADIOGRAPHIC STUDIES: I have personally reviewed the radiological images as listed and agreed with the findings in the report. Dg Chest 2 View  Result Date: 04/30/2017 CLINICAL DATA:  Fall a few days ago. EXAM: CHEST  2 VIEW COMPARISON:  Radiographs 03/30/2017 FINDINGS: Stable cardiomegaly. Unchanged pulmonary edema. Small pleural effusions, with possible decreased on the left. Bibasilar linear atelectasis or scarring. No confluent airspace disease. No pneumothorax. No evidence of displaced rib fracture. IMPRESSION: Stable cardiomegaly, pulmonary edema and pleural effusions, consistent with CHF. No evidence of superimposed acute traumatic  injury. Electronically Signed   By: Jeb Levering M.D.   On: 04/30/2017 22:41   Ct Head Wo Contrast  Result Date: 04/30/2017 CLINICAL DATA:  Patient fell last Tuesday. EXAM: CT HEAD WITHOUT CONTRAST TECHNIQUE: Contiguous axial images were obtained from the base of the skull through the vertex without intravenous contrast. COMPARISON:  None. FINDINGS: Brain: Chronic stable superficial and central atrophy with moderate degree of subcortical and periventricular white matter chronic small vessel ischemia. No large vascular  territory infarction, hemorrhage or midline shift. No intra-axial mass nor extra-axial fluid collections. The brainstem and fourth ventricle are within normal limits. No hydrocephalus. Vascular: Moderate carotid siphon calcifications. No hyperdense vessels. Skull: No skull fracture or suspicious lesions. Sinuses/Orbits: No acute finding. Other: Mild soft tissue swelling of the forehead. IMPRESSION: Mild soft tissue swelling of the forehead. No skull fracture. Chronic stable cerebral atrophy with moderate degree of small vessel ischemic change of the periventricular and subcortical white matter. Electronically Signed   By: Ashley Royalty M.D.   On: 04/30/2017 22:50    ASSESSMENT & PLAN:   Amyloidosis ([place]) The patient is absolutely convinced that the root of all his problems are due to amyloidosis and he cannot wait to get started on treatment. He is scheduled for chemotherapy tomorrow. The patient is placed on acyclovir for antimicrobial prophylaxis.  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 The patient is symptomatic from anemia and the hemoglobin level is critically low.  Some of the side-effects to be expected including risks of transfusion reactions, chills, infection, syndrome of volume overload and risk of hospitalization from various reasons and the patient is willing to proceed and went ahead to sign consent today. I will give him 1 dose of Lasix after each blood  transfusion, total of 2 doses  Chronic diastolic heart failure ([place]) He had chronic bilateral lower extremity edema and follows with cardiologist closely. We discussed other treatment options including compression hose to help reduce leg swelling but the patient declined He will continue to get Lasix after each unit of blood to reduce risk of fluid overload  Discharge planning He will be discharged at the end of the day after blood transfusion is completed  All questions were answered. The patient knows to call the clinic with any problems, questions or concerns.    Heath Lark, MD 05/02/2017 11:02 AM

## 2017-05-02 NOTE — Progress Notes (Signed)
This encounter was created in error - please disregard.

## 2017-05-02 NOTE — Telephone Encounter (Signed)
-----   Message from Heath Lark, MD sent at 05/02/2017  6:42 AM EDT ----- Regarding: he should be seen today to discuss blood transfusion Can you call him and ask if he can come in today? I have 10 am opening. He needs to be here at 930 am for labs If he can make it, let me know ASAP and I will send scheduling msg

## 2017-05-02 NOTE — Telephone Encounter (Signed)
Scheduled appt per sch message - patient  Aware of appt added f or today per high priority message.

## 2017-05-02 NOTE — Progress Notes (Signed)
Paged on-call physician for discharge orders.

## 2017-05-02 NOTE — Telephone Encounter (Signed)
+   Called with below message, patient will be here at 0930 for labs.

## 2017-05-02 NOTE — Progress Notes (Signed)
Discharge instructions given and all questions answered. Pt will be going home with spouse.

## 2017-05-03 ENCOUNTER — Ambulatory Visit (HOSPITAL_BASED_OUTPATIENT_CLINIC_OR_DEPARTMENT_OTHER): Payer: Medicare Other

## 2017-05-03 ENCOUNTER — Other Ambulatory Visit (HOSPITAL_BASED_OUTPATIENT_CLINIC_OR_DEPARTMENT_OTHER): Payer: Medicare Other

## 2017-05-03 VITALS — BP 116/61 | HR 61 | Temp 98.0°F | Resp 18

## 2017-05-03 DIAGNOSIS — Z5112 Encounter for antineoplastic immunotherapy: Secondary | ICD-10-CM | POA: Diagnosis present

## 2017-05-03 DIAGNOSIS — E859 Amyloidosis, unspecified: Secondary | ICD-10-CM

## 2017-05-03 LAB — COMPREHENSIVE METABOLIC PANEL
ALBUMIN: 3.2 g/dL — AB (ref 3.5–5.0)
ALK PHOS: 94 U/L (ref 40–150)
ALT: 11 U/L (ref 0–55)
AST: 14 U/L (ref 5–34)
Anion Gap: 10 mEq/L (ref 3–11)
BILIRUBIN TOTAL: 1.52 mg/dL — AB (ref 0.20–1.20)
BUN: 51.6 mg/dL — ABNORMAL HIGH (ref 7.0–26.0)
CO2: 26 mEq/L (ref 22–29)
Calcium: 8.9 mg/dL (ref 8.4–10.4)
Chloride: 103 mEq/L (ref 98–109)
Creatinine: 2.3 mg/dL — ABNORMAL HIGH (ref 0.7–1.3)
EGFR: 26 mL/min/{1.73_m2} — ABNORMAL LOW (ref >90)
GLUCOSE: 107 mg/dL (ref 70–140)
Potassium: 4.2 mEq/L (ref 3.5–5.1)
SODIUM: 139 meq/L (ref 136–145)
TOTAL PROTEIN: 6.6 g/dL (ref 6.4–8.3)

## 2017-05-03 LAB — CBC WITH DIFFERENTIAL/PLATELET
BASO%: 1 % (ref 0.0–2.0)
Basophils Absolute: 0.1 10*3/uL (ref 0.0–0.1)
EOS%: 2.2 % (ref 0.0–7.0)
Eosinophils Absolute: 0.1 10*3/uL (ref 0.0–0.5)
HCT: 27.5 % — ABNORMAL LOW (ref 38.4–49.9)
HGB: 8.5 g/dL — ABNORMAL LOW (ref 13.0–17.1)
LYMPH%: 28.1 % (ref 14.0–49.0)
MCH: 22.7 pg — ABNORMAL LOW (ref 27.2–33.4)
MCHC: 30.9 g/dL — ABNORMAL LOW (ref 32.0–36.0)
MCV: 73.3 fL — ABNORMAL LOW (ref 79.3–98.0)
MONO#: 0.9 10*3/uL (ref 0.1–0.9)
MONO%: 17.9 % — ABNORMAL HIGH (ref 0.0–14.0)
NEUT#: 2.6 10*3/uL (ref 1.5–6.5)
NEUT%: 50.8 % (ref 39.0–75.0)
Platelets: 262 10*3/uL (ref 140–400)
RBC: 3.75 10*6/uL — ABNORMAL LOW (ref 4.20–5.82)
RDW: 31 % — ABNORMAL HIGH (ref 11.0–14.6)
WBC: 5.1 10*3/uL (ref 4.0–10.3)
lymph#: 1.4 10*3/uL (ref 0.9–3.3)
nRBC: 3 % — ABNORMAL HIGH (ref 0–0)

## 2017-05-03 LAB — BPAM RBC
Blood Product Expiration Date: 201806112359
Blood Product Expiration Date: 201806122359
ISSUE DATE / TIME: 201805211130
ISSUE DATE / TIME: 201805211416
Unit Type and Rh: 5100
Unit Type and Rh: 5100

## 2017-05-03 LAB — TECHNOLOGIST REVIEW: Technologist Review: 1

## 2017-05-03 LAB — TYPE AND SCREEN
ABO/RH(D): O POS
Antibody Screen: NEGATIVE
UNIT DIVISION: 0
Unit division: 0

## 2017-05-03 MED ORDER — PROCHLORPERAZINE MALEATE 10 MG PO TABS: 10.0000 mg | ORAL_TABLET | Freq: Once | ORAL | Status: DC

## 2017-05-03 MED ORDER — BORTEZOMIB CHEMO SQ INJECTION 3.5 MG (2.5MG/ML)
1.3000 mg/m2 | Freq: Once | INTRAMUSCULAR | Status: AC
  Administered 2017-05-03: 3 mg via SUBCUTANEOUS
  Filled 2017-05-03: qty 3

## 2017-05-03 MED ORDER — PROCHLORPERAZINE MALEATE 10 MG PO TABS
ORAL_TABLET | ORAL | Status: AC
  Filled 2017-05-03: qty 1

## 2017-05-03 NOTE — Patient Instructions (Signed)
[facility] Discharge Instructions for Patients Receiving Chemotherapy  Today you received the following chemotherapy agents:  Velcade  To help prevent nausea and vomiting after your treatment, we encourage you to take your nausea medication as prescribed.   If you develop nausea and vomiting that is not controlled by your nausea medication, call the clinic.   BELOW ARE SYMPTOMS THAT SHOULD BE REPORTED IMMEDIATELY:  *FEVER GREATER THAN 100.5 F  *CHILLS WITH OR WITHOUT FEVER  NAUSEA AND VOMITING THAT IS NOT CONTROLLED WITH YOUR NAUSEA MEDICATION  *UNUSUAL SHORTNESS OF BREATH  *UNUSUAL BRUISING OR BLEEDING  TENDERNESS IN MOUTH AND THROAT WITH OR WITHOUT PRESENCE OF ULCERS  *URINARY PROBLEMS  *BOWEL PROBLEMS  UNUSUAL RASH Items with * indicate a potential emergency and should be followed up as soon as possible.  Feel free to call the clinic you have any questions or concerns. The clinic phone number is [phone].  Please show the CHEMO ALERT CARD at check-in to the Emergency Department and triage nurse.     Bortezomib injection What is this medicine? BORTEZOMIB (bor TEZ oh mib) is a medicine that targets proteins in cancer cells and stops the cancer cells from growing. It is used to treat multiple myeloma and mantle-cell lymphoma. This medicine may be used for other purposes; ask your health care provider or pharmacist if you have questions. COMMON BRAND NAME(S): Velcade What should I tell my health care provider before I take this medicine? They need to know if you have any of these conditions: -diabetes -heart disease -irregular heartbeat -liver disease -on hemodialysis -low blood counts, like low white blood cells, platelets, or hemoglobin -peripheral neuropathy -taking medicine for blood pressure -an unusual or allergic reaction to bortezomib, mannitol, boron, other medicines, foods, dyes, or preservatives -pregnant or trying to get  pregnant -breast-feeding How should I use this medicine? This medicine is for injection into a vein or for injection under the skin. It is given by a health care professional in a hospital or clinic setting. Talk to your pediatrician regarding the use of this medicine in children. Special care may be needed. Overdosage: If you think you have taken too much of this medicine contact a poison control center or emergency room at once. NOTE: This medicine is only for you. Do not share this medicine with others. What if I miss a dose? It is important not to miss your dose. Call your doctor or health care professional if you are unable to keep an appointment. What may interact with this medicine? This medicine may interact with the following medications: -ketoconazole -rifampin -ritonavir -St. John's Wort This list may not describe all possible interactions. Give your health care provider a list of all the medicines, herbs, non-prescription drugs, or dietary supplements you use. Also tell them if you smoke, drink alcohol, or use illegal drugs. Some items may interact with your medicine. What should I watch for while using this medicine? You may get drowsy or dizzy. Do not drive, use machinery, or do anything that needs mental alertness until you know how this medicine affects you. Do not stand or sit up quickly, especially if you are an older patient. This reduces the risk of dizzy or fainting spells. In some cases, you may be given additional medicines to help with side effects. Follow all directions for their use. Call your doctor or health care professional for advice if you get a fever, chills or sore throat, or other symptoms of a cold or flu.   not treat yourself. This drug decreases your body's ability to fight infections. Try to avoid being around people who are sick. This medicine may increase your risk to bruise or bleed. Call your doctor or health care professional if you notice any unusual  bleeding. You may need blood work done while you are taking this medicine. In some patients, this medicine may cause a serious brain infection that may cause death. If you have any problems seeing, thinking, speaking, walking, or standing, tell your doctor right away. If you cannot reach your doctor, urgently seek other source of medical care. Check with your doctor or health care professional if you get an attack of severe diarrhea, nausea and vomiting, or if you sweat a lot. The loss of too much body fluid can make it dangerous for you to take this medicine. Do not become pregnant while taking this medicine or for at least 2 months after stopping it. Women should inform their doctor if they wish to become pregnant or think they might be pregnant. Men should not father a child while taking this medicine and for at least 2 months after stopping it. There is a potential for serious side effects to an unborn child. Talk to your health care professional or pharmacist for more information. Do not breast-feed an infant while taking this medicine or for 2 months after stopping it. This medicine may interfere with the ability to have a child. You should talk with your doctor or health care professional if you are concerned about your fertility. What side effects may I notice from receiving this medicine? Side effects that you should report to your doctor or health care professional as soon as possible: -allergic reactions like skin rash, itching or hives, swelling of the face, lips, or tongue -breathing problems -changes in hearing -changes in vision -fast, irregular heartbeat -feeling faint or lightheaded, falls -pain, tingling, numbness in the hands or feet -right upper belly pain -seizures -swelling of the ankles, feet, hands -unusual bleeding or bruising -unusually weak or tired -vomiting -yellowing of the eyes or skin Side effects that usually do not require medical attention (report to your  doctor or health care professional if they continue or are bothersome): -changes in emotions or moods -constipation -diarrhea -loss of appetite -headache -irritation at site where injected -nausea This list may not describe all possible side effects. Call your doctor for medical advice about side effects. You may report side effects to FDA at 1-800-FDA-1088. Where should I keep my medicine? This drug is given in a hospital or clinic and will not be stored at home. NOTE: This sheet is a summary. It may not cover all possible information. If you have questions about this medicine, talk to your doctor, pharmacist, or health care provider.  2018 Elsevier/Gold Standard (2016-10-28 15:53:51)  

## 2017-05-04 ENCOUNTER — Telehealth: Payer: Self-pay | Admitting: *Deleted

## 2017-05-04 NOTE — Telephone Encounter (Signed)
Called patient to follow up after 1st chemo. States he tolerated well, denies nausea or vomiting. States he feels better after transfusion

## 2017-05-04 NOTE — Telephone Encounter (Signed)
-----   Message from Thomasene Lot, RN sent at 05/04/2017 10:23 AM EDT ----- Regarding: Dr Alvy Bimler 1st tx f/u call 1st tx f/u call

## 2017-05-05 DIAGNOSIS — Z4802 Encounter for removal of sutures: Secondary | ICD-10-CM | POA: Diagnosis not present

## 2017-05-05 DIAGNOSIS — Z5189 Encounter for other specified aftercare: Secondary | ICD-10-CM | POA: Diagnosis not present

## 2017-05-10 ENCOUNTER — Ambulatory Visit (HOSPITAL_BASED_OUTPATIENT_CLINIC_OR_DEPARTMENT_OTHER): Payer: Medicare Other

## 2017-05-10 ENCOUNTER — Other Ambulatory Visit (HOSPITAL_BASED_OUTPATIENT_CLINIC_OR_DEPARTMENT_OTHER): Payer: Medicare Other

## 2017-05-10 VITALS — BP 111/58 | HR 68 | Temp 98.2°F | Resp 18

## 2017-05-10 DIAGNOSIS — Z5112 Encounter for antineoplastic immunotherapy: Secondary | ICD-10-CM | POA: Diagnosis present

## 2017-05-10 DIAGNOSIS — E859 Amyloidosis, unspecified: Secondary | ICD-10-CM

## 2017-05-10 LAB — COMPREHENSIVE METABOLIC PANEL
ALBUMIN: 3.3 g/dL — AB (ref 3.5–5.0)
ALT: 19 U/L (ref 0–55)
AST: 16 U/L (ref 5–34)
Alkaline Phosphatase: 117 U/L (ref 40–150)
Anion Gap: 10 mEq/L (ref 3–11)
BUN: 42.1 mg/dL — AB (ref 7.0–26.0)
CHLORIDE: 102 meq/L (ref 98–109)
CO2: 27 meq/L (ref 22–29)
Calcium: 9.1 mg/dL (ref 8.4–10.4)
Creatinine: 2 mg/dL — ABNORMAL HIGH (ref 0.7–1.3)
EGFR: 32 mL/min/{1.73_m2} — ABNORMAL LOW (ref >90)
GLUCOSE: 141 mg/dL — AB (ref 70–140)
POTASSIUM: 4.5 meq/L (ref 3.5–5.1)
SODIUM: 139 meq/L (ref 136–145)
Total Bilirubin: 1.27 mg/dL — ABNORMAL HIGH (ref 0.20–1.20)
Total Protein: 7 g/dL (ref 6.4–8.3)

## 2017-05-10 LAB — CBC WITH DIFFERENTIAL/PLATELET
BASO%: 1.9 % (ref 0.0–2.0)
Basophils Absolute: 0.1 10*3/uL (ref 0.0–0.1)
EOS%: 0.6 % (ref 0.0–7.0)
Eosinophils Absolute: 0 10*3/uL (ref 0.0–0.5)
HCT: 29.9 % — ABNORMAL LOW (ref 38.4–49.9)
HEMOGLOBIN: 9.1 g/dL — AB (ref 13.0–17.1)
LYMPH#: 0.9 10*3/uL (ref 0.9–3.3)
LYMPH%: 28.7 % (ref 14.0–49.0)
MCH: 22.5 pg — ABNORMAL LOW (ref 27.2–33.4)
MCHC: 30.4 g/dL — AB (ref 32.0–36.0)
MCV: 73.8 fL — ABNORMAL LOW (ref 79.3–98.0)
MONO#: 0.5 10*3/uL (ref 0.1–0.9)
MONO%: 14.6 % — ABNORMAL HIGH (ref 0.0–14.0)
NEUT#: 1.7 10*3/uL (ref 1.5–6.5)
NEUT%: 54.2 % (ref 39.0–75.0)
Platelets: 289 10*3/uL (ref 140–400)
RBC: 4.05 10*6/uL — ABNORMAL LOW (ref 4.20–5.82)
RDW: 32 % — ABNORMAL HIGH (ref 11.0–14.6)
WBC: 3.1 10*3/uL — ABNORMAL LOW (ref 4.0–10.3)
nRBC: 1 % — ABNORMAL HIGH (ref 0–0)

## 2017-05-10 LAB — TECHNOLOGIST REVIEW

## 2017-05-10 MED ORDER — BORTEZOMIB CHEMO SQ INJECTION 3.5 MG (2.5MG/ML)
1.3000 mg/m2 | Freq: Once | INTRAMUSCULAR | Status: AC
  Administered 2017-05-10: 3 mg via SUBCUTANEOUS
  Filled 2017-05-10: qty 3

## 2017-05-10 MED ORDER — PROCHLORPERAZINE MALEATE 10 MG PO TABS: 10.0000 mg | ORAL_TABLET | Freq: Once | ORAL | Status: DC

## 2017-05-10 MED ORDER — PROCHLORPERAZINE MALEATE 10 MG PO TABS
ORAL_TABLET | ORAL | Status: AC
  Filled 2017-05-10: qty 1

## 2017-05-10 NOTE — Patient Instructions (Signed)
[facility] Discharge Instructions for Patients Receiving Chemotherapy  Today you received the following chemotherapy agents Velcade. To help prevent nausea and vomiting after your treatment, we encourage you to take your nausea medication as directed.  If you develop nausea and vomiting that is not controlled by your nausea medication, call the clinic.   BELOW ARE SYMPTOMS THAT SHOULD BE REPORTED IMMEDIATELY:  *FEVER GREATER THAN 100.5 F  *CHILLS WITH OR WITHOUT FEVER  NAUSEA AND VOMITING THAT IS NOT CONTROLLED WITH YOUR NAUSEA MEDICATION  *UNUSUAL SHORTNESS OF BREATH  *UNUSUAL BRUISING OR BLEEDING  TENDERNESS IN MOUTH AND THROAT WITH OR WITHOUT PRESENCE OF ULCERS  *URINARY PROBLEMS  *BOWEL PROBLEMS  UNUSUAL RASH Items with * indicate a potential emergency and should be followed up as soon as possible.  Feel free to call the clinic you have any questions or concerns. The clinic phone number is [phone].  Please show the CHEMO ALERT CARD at check-in to the Emergency Department and triage nurse.    

## 2017-05-10 NOTE — Progress Notes (Signed)
Cut noticed on left leg, pt reports that he "fell last week". redness noted around area. Dr. Alvy Bimler aware and states to have pt monitor until next visit 05/17/17, and to call if site worsens. Pt verbalizes understanding. No drainage noted, pt states that he thinks it is "getting better".

## 2017-05-10 NOTE — Progress Notes (Signed)
Per Hassan Rowan RN desk ok to tx per Dr Alvy Bimler with Creatinine of 2.0 today

## 2017-05-12 DIAGNOSIS — E8581 Light chain (AL) amyloidosis: Secondary | ICD-10-CM | POA: Diagnosis not present

## 2017-05-17 ENCOUNTER — Other Ambulatory Visit (HOSPITAL_BASED_OUTPATIENT_CLINIC_OR_DEPARTMENT_OTHER): Payer: Medicare Other

## 2017-05-17 ENCOUNTER — Ambulatory Visit (HOSPITAL_BASED_OUTPATIENT_CLINIC_OR_DEPARTMENT_OTHER): Payer: Medicare Other

## 2017-05-17 ENCOUNTER — Other Ambulatory Visit: Payer: Self-pay | Admitting: Hematology and Oncology

## 2017-05-17 VITALS — BP 129/66 | HR 69 | Temp 97.8°F | Resp 18

## 2017-05-17 DIAGNOSIS — E859 Amyloidosis, unspecified: Secondary | ICD-10-CM

## 2017-05-17 DIAGNOSIS — Z5112 Encounter for antineoplastic immunotherapy: Secondary | ICD-10-CM

## 2017-05-17 DIAGNOSIS — D630 Anemia in neoplastic disease: Secondary | ICD-10-CM

## 2017-05-17 LAB — COMPREHENSIVE METABOLIC PANEL
ALT: 29 U/L (ref 0–55)
ANION GAP: 8 meq/L (ref 3–11)
AST: 19 U/L (ref 5–34)
Albumin: 3.1 g/dL — ABNORMAL LOW (ref 3.5–5.0)
Alkaline Phosphatase: 128 U/L (ref 40–150)
BILIRUBIN TOTAL: 0.83 mg/dL (ref 0.20–1.20)
BUN: 38.7 mg/dL — ABNORMAL HIGH (ref 7.0–26.0)
CALCIUM: 8.4 mg/dL (ref 8.4–10.4)
CO2: 27 mEq/L (ref 22–29)
CREATININE: 2 mg/dL — AB (ref 0.7–1.3)
Chloride: 105 mEq/L (ref 98–109)
EGFR: 32 mL/min/{1.73_m2} — ABNORMAL LOW (ref >90)
Glucose: 101 mg/dl (ref 70–140)
Potassium: 3.7 mEq/L (ref 3.5–5.1)
Sodium: 140 mEq/L (ref 136–145)
TOTAL PROTEIN: 6.2 g/dL — AB (ref 6.4–8.3)

## 2017-05-17 LAB — CBC WITH DIFFERENTIAL/PLATELET
BASO%: 3 % — ABNORMAL HIGH (ref 0.0–2.0)
BASOS ABS: 0.1 10*3/uL (ref 0.0–0.1)
EOS ABS: 0.3 10*3/uL (ref 0.0–0.5)
EOS%: 8.5 % — ABNORMAL HIGH (ref 0.0–7.0)
HEMATOCRIT: 27.8 % — AB (ref 38.4–49.9)
HGB: 8.5 g/dL — ABNORMAL LOW (ref 13.0–17.1)
LYMPH%: 54.9 % — ABNORMAL HIGH (ref 14.0–49.0)
MCH: 22.5 pg — ABNORMAL LOW (ref 27.2–33.4)
MCHC: 30.6 g/dL — ABNORMAL LOW (ref 32.0–36.0)
MCV: 73.7 fL — ABNORMAL LOW (ref 79.3–98.0)
MONO#: 0.2 10*3/uL (ref 0.1–0.9)
MONO%: 4.4 % (ref 0.0–14.0)
NEUT%: 29.2 % — ABNORMAL LOW (ref 39.0–75.0)
NEUTROS ABS: 1.1 10*3/uL — AB (ref 1.5–6.5)
NRBC: 2 % — AB (ref 0–0)
PLATELETS: 207 10*3/uL (ref 140–400)
RBC: 3.77 10*6/uL — ABNORMAL LOW (ref 4.20–5.82)
RDW: 32.1 % — AB (ref 11.0–14.6)
WBC: 3.7 10*3/uL — AB (ref 4.0–10.3)
lymph#: 2 10*3/uL (ref 0.9–3.3)

## 2017-05-17 MED ORDER — PROCHLORPERAZINE MALEATE 10 MG PO TABS: 10.0000 mg | ORAL_TABLET | Freq: Once | ORAL | Status: DC

## 2017-05-17 MED ORDER — BORTEZOMIB CHEMO SQ INJECTION 3.5 MG (2.5MG/ML)
1.3000 mg/m2 | Freq: Once | INTRAMUSCULAR | Status: AC
  Administered 2017-05-17: 3 mg via SUBCUTANEOUS
  Filled 2017-05-17: qty 3

## 2017-05-17 NOTE — Progress Notes (Signed)
Per Dr. Calton Dach treatment parameters, okay to treat with ANC of 1.1, and Crt of 2.

## 2017-05-17 NOTE — Patient Instructions (Signed)
[facility] Discharge Instructions for Patients Receiving Chemotherapy  Today you received the following chemotherapy agents Velcade. To help prevent nausea and vomiting after your treatment, we encourage you to take your nausea medication as directed.  If you develop nausea and vomiting that is not controlled by your nausea medication, call the clinic.   BELOW ARE SYMPTOMS THAT SHOULD BE REPORTED IMMEDIATELY:  *FEVER GREATER THAN 100.5 F  *CHILLS WITH OR WITHOUT FEVER  NAUSEA AND VOMITING THAT IS NOT CONTROLLED WITH YOUR NAUSEA MEDICATION  *UNUSUAL SHORTNESS OF BREATH  *UNUSUAL BRUISING OR BLEEDING  TENDERNESS IN MOUTH AND THROAT WITH OR WITHOUT PRESENCE OF ULCERS  *URINARY PROBLEMS  *BOWEL PROBLEMS  UNUSUAL RASH Items with * indicate a potential emergency and should be followed up as soon as possible.  Feel free to call the clinic you have any questions or concerns. The clinic phone number is [phone].  Please show the CHEMO ALERT CARD at check-in to the Emergency Department and triage nurse.    

## 2017-05-18 NOTE — Discharge Summary (Signed)
The patient was briefly sent to the observation unit for blood transfusion.  He was discharged the same day after blood transfusion was completed without complications.

## 2017-05-24 ENCOUNTER — Observation Stay
Admission: AD | Admit: 2017-05-24 | Payer: Medicare Other | Source: Ambulatory Visit | Admitting: Hematology and Oncology

## 2017-05-24 ENCOUNTER — Ambulatory Visit (HOSPITAL_BASED_OUTPATIENT_CLINIC_OR_DEPARTMENT_OTHER): Payer: Medicare Other

## 2017-05-24 ENCOUNTER — Ambulatory Visit: Payer: Medicare Other

## 2017-05-24 ENCOUNTER — Ambulatory Visit (HOSPITAL_COMMUNITY)
Admission: RE | Admit: 2017-05-24 | Discharge: 2017-05-24 | Disposition: A | Discharge status: Home / Self Care | Payer: Medicare Other | Source: Ambulatory Visit | Attending: Hematology and Oncology | Admitting: Hematology and Oncology

## 2017-05-24 ENCOUNTER — Telehealth: Payer: Self-pay | Admitting: Hematology and Oncology

## 2017-05-24 ENCOUNTER — Ambulatory Visit (HOSPITAL_BASED_OUTPATIENT_CLINIC_OR_DEPARTMENT_OTHER): Payer: Medicare Other | Admitting: Hematology and Oncology

## 2017-05-24 ENCOUNTER — Other Ambulatory Visit (HOSPITAL_BASED_OUTPATIENT_CLINIC_OR_DEPARTMENT_OTHER): Payer: Medicare Other

## 2017-05-24 VITALS — BP 102/41 | HR 77 | Temp 98.8°F | Resp 18 | Ht 71.0 in | Wt 248.3 lb

## 2017-05-24 DIAGNOSIS — D630 Anemia in neoplastic disease: Secondary | ICD-10-CM

## 2017-05-24 DIAGNOSIS — E859 Amyloidosis, unspecified: Secondary | ICD-10-CM

## 2017-05-24 DIAGNOSIS — Z5112 Encounter for antineoplastic immunotherapy: Secondary | ICD-10-CM | POA: Diagnosis present

## 2017-05-24 DIAGNOSIS — W19XXXA Unspecified fall, initial encounter: Secondary | ICD-10-CM | POA: Diagnosis not present

## 2017-05-24 DIAGNOSIS — N183 Chronic kidney disease, stage 3 unspecified: Secondary | ICD-10-CM

## 2017-05-24 DIAGNOSIS — I5032 Chronic diastolic (congestive) heart failure: Secondary | ICD-10-CM

## 2017-05-24 DIAGNOSIS — I5033 Acute on chronic diastolic (congestive) heart failure: Secondary | ICD-10-CM

## 2017-05-24 DIAGNOSIS — T148XXA Other injury of unspecified body region, initial encounter: Secondary | ICD-10-CM | POA: Diagnosis not present

## 2017-05-24 LAB — COMPREHENSIVE METABOLIC PANEL
ALBUMIN: 3 g/dL — AB (ref 3.5–5.0)
ALK PHOS: 121 U/L (ref 40–150)
ALT: 16 U/L (ref 0–55)
AST: 17 U/L (ref 5–34)
Anion Gap: 10 mEq/L (ref 3–11)
BUN: 35.7 mg/dL — ABNORMAL HIGH (ref 7.0–26.0)
CO2: 28 mEq/L (ref 22–29)
Calcium: 8.9 mg/dL (ref 8.4–10.4)
Chloride: 104 mEq/L (ref 98–109)
Creatinine: 2 mg/dL — ABNORMAL HIGH (ref 0.7–1.3)
EGFR: 31 mL/min/{1.73_m2} — AB (ref >90)
Glucose: 84 mg/dl (ref 70–140)
POTASSIUM: 4.3 meq/L (ref 3.5–5.1)
Sodium: 142 mEq/L (ref 136–145)
Total Bilirubin: 0.79 mg/dL (ref 0.20–1.20)
Total Protein: 6 g/dL — ABNORMAL LOW (ref 6.4–8.3)

## 2017-05-24 LAB — CBC WITH DIFFERENTIAL/PLATELET
BASO%: 2.2 % — ABNORMAL HIGH (ref 0.0–2.0)
BASOS ABS: 0.1 10*3/uL (ref 0.0–0.1)
EOS%: 8.7 % — ABNORMAL HIGH (ref 0.0–7.0)
Eosinophils Absolute: 0.3 10*3/uL (ref 0.0–0.5)
HCT: 24.6 % — ABNORMAL LOW (ref 38.4–49.9)
HEMOGLOBIN: 7.5 g/dL — AB (ref 13.0–17.1)
LYMPH#: 1.5 10*3/uL (ref 0.9–3.3)
LYMPH%: 47.1 % (ref 14.0–49.0)
MCH: 22.5 pg — AB (ref 27.2–33.4)
MCHC: 30.5 g/dL — ABNORMAL LOW (ref 32.0–36.0)
MCV: 73.7 fL — AB (ref 79.3–98.0)
MONO#: 0.2 10*3/uL (ref 0.1–0.9)
MONO%: 7.1 % (ref 0.0–14.0)
NEUT#: 1.1 10*3/uL — ABNORMAL LOW (ref 1.5–6.5)
NEUT%: 34.9 % — AB (ref 39.0–75.0)
RBC: 3.34 10*6/uL — ABNORMAL LOW (ref 4.20–5.82)
RDW: 32.5 % — ABNORMAL HIGH (ref 11.0–14.6)
WBC: 3.1 10*3/uL — ABNORMAL LOW (ref 4.0–10.3)
nRBC: 2 % — ABNORMAL HIGH (ref 0–0)

## 2017-05-24 LAB — TECHNOLOGIST REVIEW: Technologist Review: ABNORMAL

## 2017-05-24 LAB — PREPARE RBC (CROSSMATCH)

## 2017-05-24 MED ORDER — DIPHENHYDRAMINE HCL 25 MG PO CAPS
25.0000 mg | ORAL_CAPSULE | Freq: Once | ORAL | Status: AC
  Administered 2017-05-24: 25 mg via ORAL

## 2017-05-24 MED ORDER — DIPHENHYDRAMINE HCL 25 MG PO CAPS
ORAL_CAPSULE | ORAL | Status: AC
  Filled 2017-05-24: qty 1

## 2017-05-24 MED ORDER — HEPARIN SOD (PORK) LOCK FLUSH 100 UNIT/ML IV SOLN
500.0000 [IU] | Freq: Every day | INTRAVENOUS | Status: DC | PRN
  Filled 2017-05-24: qty 5

## 2017-05-24 MED ORDER — ACETAMINOPHEN 325 MG PO TABS
650.0000 mg | ORAL_TABLET | Freq: Once | ORAL | Status: AC
  Administered 2017-05-24: 650 mg via ORAL

## 2017-05-24 MED ORDER — PROCHLORPERAZINE MALEATE 10 MG PO TABS: 10.0000 mg | ORAL_TABLET | Freq: Once | ORAL | Status: DC

## 2017-05-24 MED ORDER — HEPARIN SOD (PORK) LOCK FLUSH 100 UNIT/ML IV SOLN
250.0000 [IU] | INTRAVENOUS | Status: DC | PRN
  Filled 2017-05-24: qty 5

## 2017-05-24 MED ORDER — BORTEZOMIB CHEMO SQ INJECTION 3.5 MG (2.5MG/ML)
1.3000 mg/m2 | Freq: Once | INTRAMUSCULAR | Status: AC
  Administered 2017-05-24: 3 mg via SUBCUTANEOUS
  Filled 2017-05-24: qty 3

## 2017-05-24 MED ORDER — ACETAMINOPHEN 325 MG PO TABS
ORAL_TABLET | ORAL | Status: AC
  Filled 2017-05-24: qty 2

## 2017-05-24 MED ORDER — FUROSEMIDE 10 MG/ML IJ SOLN
INTRAMUSCULAR | Status: AC
  Filled 2017-05-24: qty 2

## 2017-05-24 MED ORDER — SODIUM CHLORIDE 0.9 % IV SOLN
250.0000 mL | Freq: Once | INTRAVENOUS | Status: AC
  Administered 2017-05-24: 250 mL via INTRAVENOUS

## 2017-05-24 MED ORDER — SODIUM CHLORIDE 0.9% FLUSH
10.0000 mL | INTRAVENOUS | Status: DC | PRN
  Filled 2017-05-24: qty 10

## 2017-05-24 MED ORDER — SODIUM CHLORIDE 0.9% FLUSH
3.0000 mL | INTRAVENOUS | Status: DC | PRN
  Filled 2017-05-24: qty 10

## 2017-05-24 MED ORDER — FUROSEMIDE 10 MG/ML IJ SOLN
20.0000 mg | Freq: Once | INTRAMUSCULAR | Status: AC
  Administered 2017-05-24: 20 mg via INTRAVENOUS

## 2017-05-24 MED ORDER — PROCHLORPERAZINE MALEATE 10 MG PO TABS
ORAL_TABLET | ORAL | Status: AC
  Filled 2017-05-24: qty 1

## 2017-05-24 MED ORDER — SPIRONOLACTONE 25 MG PO TABS: 25.0000 mg | ORAL_TABLET | Freq: Every day | ORAL | 0 refills | Status: AC

## 2017-05-24 NOTE — Telephone Encounter (Signed)
Scheduled appt per 6/12 los . Patient called to treatment area before schedule was finished- patient to get new schedule in the treatment area . Patient aware to ask for schedule.

## 2017-05-24 NOTE — Patient Instructions (Signed)
[facility] Discharge Instructions for Patients Receiving Chemotherapy  Today you received the following chemotherapy agents Velcade. To help prevent nausea and vomiting after your treatment, we encourage you to take your nausea medication as directed.  If you develop nausea and vomiting that is not controlled by your nausea medication, call the clinic.   BELOW ARE SYMPTOMS THAT SHOULD BE REPORTED IMMEDIATELY:  *FEVER GREATER THAN 100.5 F  *CHILLS WITH OR WITHOUT FEVER  NAUSEA AND VOMITING THAT IS NOT CONTROLLED WITH YOUR NAUSEA MEDICATION  *UNUSUAL SHORTNESS OF BREATH  *UNUSUAL BRUISING OR BLEEDING  TENDERNESS IN MOUTH AND THROAT WITH OR WITHOUT PRESENCE OF ULCERS  *URINARY PROBLEMS  *BOWEL PROBLEMS  UNUSUAL RASH Items with * indicate a potential emergency and should be followed up as soon as possible.  Feel free to call the clinic you have any questions or concerns. The clinic phone number is [phone].  Please show the CHEMO ALERT CARD at check-in to the Emergency Department and triage nurse.    

## 2017-05-24 NOTE — Progress Notes (Signed)
Patient came to Infusion Room and asked to use the phone.  Called his wife's number and he spoke with her.  Patient said he needed to go to his car to get his cell phone because he was going to be here all day b/c he needed blood.  Asked patient if someone could go to his car for him or if he needed assistance getting to his car.  He refused and said he was fine.  Instructed him to take his time and come back when he had retrieved his cell phone.  I will tell his nurse, but please take his time.

## 2017-05-24 NOTE — Patient Instructions (Addendum)
Do not take you lasix at home today, per Dr. Alvy Bimler.  Blood Transfusion, Adult A blood transfusion is a procedure in which you receive donated blood, including plasma, platelets, and red blood cells, through an IV tube. You may need a blood transfusion because of illness, surgery, or injury. The blood may come from a donor. You may also be able to donate blood for yourself (autologous blood donation) before a surgery if you know that you might require a blood transfusion. The blood given in a transfusion is made up of different types of cells. You may receive:  Red blood cells. These carry oxygen to the cells in the body.  White blood cells. These help you fight infections.  Platelets. These help your blood to clot.  Plasma. This is the liquid part of your blood and it helps with fluid imbalances.  If you have hemophilia or another clotting disorder, you may also receive other types of blood products. Tell a health care provider about:  Any allergies you have.  All medicines you are taking, including vitamins, herbs, eye drops, creams, and over-the-counter medicines.  Any problems you or family members have had with anesthetic medicines.  Any blood disorders you have.  Any surgeries you have had.  Any medical conditions you have, including any recent fever or cold symptoms.  Whether you are pregnant or may be pregnant.  Any previous reactions you have had during a blood transfusion. What are the risks? Generally, this is a safe procedure. However, problems may occur, including:  Having an allergic reaction to something in the donated blood. Hives and itching may be symptoms of this type of reaction.  Fever. This may be a reaction to the white blood cells in the transfused blood. Nausea or chest pain may accompany a fever.  Iron overload. This can happen from having many transfusions.  Transfusion-related acute lung injury (TRALI). This is a rare reaction that causes lung  damage. The cause is not known.TRALI can occur within hours of a transfusion or several days later.  Sudden (acute) or delayed hemolytic reactions. This happens if your blood does not match the cells in your transfusion. Your body's defense system (immune system) may try to attack the new cells. This complication is rare. The symptoms include fever, chills, nausea, and low back pain or chest pain.  Infection or disease transmission. This is rare.  What happens before the procedure?  You will have a blood test to determine your blood type. This is necessary to know what kind of blood your body will accept and to match it to the donor blood.  If you are going to have a planned surgery, you may be able to do an autologous blood donation. This may be done in case you need to have a transfusion.  If you have had an allergic reaction to a transfusion in the past, you may be given medicine to help prevent a reaction. This medicine may be given to you by mouth or through an IV tube.  You will have your temperature, blood pressure, and pulse monitored before the transfusion.  Follow instructions from your health care provider about eating and drinking restrictions.  Ask your health care provider about: ? Changing or stopping your regular medicines. This is especially important if you are taking diabetes medicines or blood thinners. ? Taking medicines such as aspirin and ibuprofen. These medicines can thin your blood. Do not take these medicines before your procedure if your health care provider instructs you  not to. What happens during the procedure?  An IV tube will be inserted into one of your veins.  The bag of donated blood will be attached to your IV tube. The blood will then enter through your vein.  Your temperature, blood pressure, and pulse will be monitored regularly during the transfusion. This monitoring is done to detect early signs of a transfusion reaction.  If you have any signs  or symptoms of a reaction, your transfusion will be stopped and you may be given medicine.  When the transfusion is complete, your IV tube will be removed.  Pressure may be applied to the IV site for a few minutes.  A bandage (dressing) will be applied. The procedure may vary among health care providers and hospitals. What happens after the procedure?  Your temperature, blood pressure, heart rate, breathing rate, and blood oxygen level will be monitored often.  Your blood may be tested to see how you are responding to the transfusion.  You may be warmed with fluids or blankets to maintain a normal body temperature. Summary  A blood transfusion is a procedure in which you receive donated blood, including plasma, platelets, and red blood cells, through an IV tube.  Your temperature, blood pressure, and pulse will be monitored before, during, and after the transfusion.  Your blood may be tested after the transfusion to see how your body has responded. This information is not intended to replace advice given to you by your health care provider. Make sure you discuss any questions you have with your health care provider. Document Released: 11/26/2000 Document Revised: 08/26/2016 Document Reviewed: 08/26/2016 Elsevier Interactive Patient Education  [employer].

## 2017-05-25 ENCOUNTER — Encounter: Payer: Self-pay | Admitting: Hematology and Oncology

## 2017-05-25 DIAGNOSIS — W19XXXA Unspecified fall, initial encounter: Secondary | ICD-10-CM | POA: Insufficient documentation

## 2017-05-25 LAB — TYPE AND SCREEN
ABO/RH(D): O POS
ANTIBODY SCREEN: NEGATIVE
UNIT DIVISION: 0
UNIT DIVISION: 0

## 2017-05-25 LAB — BPAM RBC
Blood Product Expiration Date: 201806292359
Blood Product Expiration Date: 201806292359
ISSUE DATE / TIME: 201806121155
ISSUE DATE / TIME: 201806121155
UNIT TYPE AND RH: 5100
Unit Type and Rh: 5100

## 2017-05-25 NOTE — Assessment & Plan Note (Addendum)
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

## 2017-05-25 NOTE — Assessment & Plan Note (Signed)
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 The patient is symptomatic from anemia and the hemoglobin level is critically low.  Some of the side-effects to be expected including risks of transfusion reactions, chills, infection, syndrome of volume overload and risk of hospitalization from various reasons and the patient is willing to proceed and went ahead to sign consent today. I plan to give him 2 units of blood with diuresis after each unit of blood

## 2017-05-25 NOTE — Assessment & Plan Note (Signed)
He has recent accidental fall at home He did not have major injuries but did have some bruises He denies dizziness or lightheadedness as a contributing factor for his fall

## 2017-05-25 NOTE — Progress Notes (Signed)
[place] OFFICE PROGRESS NOTE  Patient Care Team: Lelon Perla, MD as PCP - General (Cardiology) [name] Denice Bors, MD (Cardiology) Heath Lark, MD as Consulting Physician (Hematology and Oncology) Myrle Sheng, MD as Referring Physician (Neurosurgery) Gillis Santa, MD as Referring Physician (Pain Medicine)  SUMMARY OF ONCOLOGIC HISTORY: He was diagnosed with systemic amyloidosis after presentation with shortness of breath. He has significant evaluation including bone marrow aspirate and biopsy. 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 The patient subsequently went for maintain dense chemotherapy with Velcade for almost 2 years and then switch to Revlimid. He self discontinue Revlimid in May of 2014 due to fatigue. In July 2015, we restarted him back on Velcade due to a rising light chain. In September 2015, the patient requested to add back dexamethasone at 20 mg every week. In November 2015, the patient reduce dexamethasone to 16 mg every week along with Velcade injection. We discontinued treatment on 11/20/2014 On 05/03/2017, he resumed treatment with Velcade and dexamethasone  INTERVAL HISTORY: Please see below for problem oriented charting. He returns for follow-up He had accidental fall at home with significant laceration of skin and bruising He denies excessive bleeding He has significant fluid retention and was not able to lose weight despite furosemide 40 mg twice a day He denies excessive salt intake He complained of dizziness and weakness and he felt that he might be anemic He denies chest pain or shortness of breath The patient denies any recent signs or symptoms of bleeding such as spontaneous epistaxis, hematuria or  hematochezia.  REVIEW OF SYSTEMS:   Constitutional: Denies fevers, chills or abnormal weight loss Eyes: Denies blurriness of vision Ears, nose, mouth, throat, and face: Denies mucositis or sore throat Respiratory: Denies cough, dyspnea or wheezes Cardiovascular: Denies palpitation, chest discomfort or lower extremity swelling Gastrointestinal:  Denies nausea, heartburn or change in bowel habits Lymphatics: Denies new lymphadenopathy or easy bruising Behavioral/Psych: Mood is stable, no new changes  All other systems were reviewed with the patient and are negative.  I have reviewed the past medical history, past surgical history, social history and family history with the patient and they are unchanged from previous note.  ALLERGIES:  has No Known Allergies.  MEDICATIONS:  Current Outpatient Prescriptions  Medication Sig Dispense Refill  . acyclovir (ZOVIRAX) 400 MG tablet Take 1 tablet (400 mg total) by mouth daily. 30 tablet 3  . amiodarone (PACERONE) 200 MG tablet Take 1 tablet (200 mg total) by mouth daily. 90 tablet 3  . dexamethasone (DECADRON) 4 MG tablet Take 1 tablet (4 mg total) by mouth daily. 30 tablet 1  . furosemide (LASIX) 40 MG tablet Take 1 tablet (40 mg total) by mouth daily. (Patient taking differently: Take 40 mg by mouth 2 (two) times daily. ) 90 tablet 3  . ondansetron (ZOFRAN) 8 MG tablet Take 1 tablet (8 mg total) by mouth 2 (two) times daily as needed (Nausea or vomiting). 30 tablet 1  . prochlorperazine (COMPAZINE) 10 MG tablet Take 1 tablet (10 mg total) by mouth every 6 (six) hours as needed (Nausea or vomiting). 30 tablet 1  . pravastatin (PRAVACHOL) 40 MG tablet Take 1 tablet (40 mg total) by mouth every evening. 90 tablet 3  . spironolactone (ALDACTONE) 25 MG tablet Take 1 tablet (25 mg total)  by mouth daily. 30 tablet 0   No current facility-administered medications for this visit.    Facility-Administered Medications Ordered in Other Visits  Medication  Dose Route Frequency Provider Last Rate Last Dose  . iopamidol (ISOVUE-M) 41 % intrathecal injection 16 mL  16 mL Intrathecal Once Kristeen Miss, MD        PHYSICAL EXAMINATION: ECOG PERFORMANCE STATUS: 2 - Symptomatic, <50% confined to bed  Vitals:   05/24/17 0837  BP: (!) 102/41  Pulse: 77  Resp: 18  Temp: 98.8 F (37.1 C)   Filed Weights   05/24/17 0837  Weight: 248 lb 4.8 oz (112.6 kg)    GENERAL:alert, no distress and comfortable SKIN: Noted skin laceration which is well healed and bruises EYES: normal, Conjunctiva are pink and non-injected, sclera clear OROPHARYNX:no exudate, no erythema and lips, buccal mucosa, and tongue normal  NECK: supple, thyroid normal size, non-tender, without nodularity LYMPH:  no palpable lymphadenopathy in the cervical, axillary or inguinal LUNGS: clear to auscultation and percussion with normal breathing effort HEART: regular rate & rhythm and no murmurs with moderate bilateral lower extremity edema ABDOMEN:abdomen soft, non-tender and normal bowel sounds Musculoskeletal:no cyanosis of digits and no clubbing  NEURO: alert & oriented x 3 with fluent speech, no focal motor/sensory deficits  LABORATORY DATA:  I have reviewed the data as listed    Component Value Date/Time   NA 142 05/24/2017 0812   K 4.3 05/24/2017 0812   CL 102 04/30/2017 1140   CL 103 05/31/2013 0842   CO2 28 05/24/2017 0812   GLUCOSE 84 05/24/2017 0812   GLUCOSE 118 (H) 05/31/2013 0842   BUN 35.7 (H) 05/24/2017 0812   CREATININE 2.0 (H) 05/24/2017 0812   CALCIUM 8.9 05/24/2017 0812   PROT 6.0 (L) 05/24/2017 0812   ALBUMIN 3.0 (L) 05/24/2017 0812   AST 17 05/24/2017 0812   ALT 16 05/24/2017 0812   ALKPHOS 121 05/24/2017 0812   BILITOT 0.79 05/24/2017 0812   GFRNONAA 27 (L) 04/30/2017 1140   GFRNONAA 63 06/10/2014 1221   GFRAA 31 (L) 04/30/2017 1140   GFRAA 73 06/10/2014 1221    No results found for: SPEP, UPEP  Lab Results  Component Value Date   WBC 3.1  (L) 05/24/2017   NEUTROABS 1.1 (L) 05/24/2017   HGB 7.5 (L) 05/24/2017   HCT 24.6 (L) 05/24/2017   MCV 73.7 (L) 05/24/2017   PLT 105 Large platelets present (L) 05/24/2017      Chemistry      Component Value Date/Time   NA 142 05/24/2017 0812   K 4.3 05/24/2017 0812   CL 102 04/30/2017 1140   CL 103 05/31/2013 0842   CO2 28 05/24/2017 0812   BUN 35.7 (H) 05/24/2017 0812   CREATININE 2.0 (H) 05/24/2017 0812      Component Value Date/Time   CALCIUM 8.9 05/24/2017 0812   ALKPHOS 121 05/24/2017 0812   AST 17 05/24/2017 0812   ALT 16 05/24/2017 0812   BILITOT 0.79 05/24/2017 0812       RADIOGRAPHIC STUDIES: I have personally reviewed the radiological images as listed and agreed with the findings in the report. Dg Chest 2 View  Result Date: 04/30/2017 CLINICAL DATA:  Fall a few days ago. EXAM: CHEST  2 VIEW COMPARISON:  Radiographs 03/30/2017 FINDINGS: Stable cardiomegaly. Unchanged pulmonary edema. Small pleural effusions, with possible decreased on the left. Bibasilar linear atelectasis or scarring. No confluent airspace disease. No pneumothorax. No evidence of displaced rib fracture. IMPRESSION: Stable  cardiomegaly, pulmonary edema and pleural effusions, consistent with CHF. No evidence of superimposed acute traumatic injury. Electronically Signed   By: Rubye Oaks M.D.   On: 04/30/2017 22:41   Ct Head Wo Contrast  Result Date: 04/30/2017 CLINICAL DATA:  Patient fell last Tuesday. EXAM: CT HEAD WITHOUT CONTRAST TECHNIQUE: Contiguous axial images were obtained from the base of the skull through the vertex without intravenous contrast. COMPARISON:  None. FINDINGS: Brain: Chronic stable superficial and central atrophy with moderate degree of subcortical and periventricular white matter chronic small vessel ischemia. No large vascular territory infarction, hemorrhage or midline shift. No intra-axial mass nor extra-axial fluid collections. The brainstem and fourth ventricle are  within normal limits. No hydrocephalus. Vascular: Moderate carotid siphon calcifications. No hyperdense vessels. Skull: No skull fracture or suspicious lesions. Sinuses/Orbits: No acute finding. Other: Mild soft tissue swelling of the forehead. IMPRESSION: Mild soft tissue swelling of the forehead. No skull fracture. Chronic stable cerebral atrophy with moderate degree of small vessel ischemic change of the periventricular and subcortical white matter. Electronically Signed   By: Tollie Eth M.D.   On: 04/30/2017 22:50    ASSESSMENT & PLAN:  Amyloidosis (HCC) He tolerated treatment well but he is dealing with congestive heart failure and renal failure We will continue weekly dexamethasone along with Velcade I plan to repeat serum light chain after 4 doses of treatment The patient plans to go out of town next week for a vacation break He will resume treatment at the end of June We will continue aggressive supportive care  Anemia in neoplastic disease He has multifactorial anemia with combination of anemia chronic disease, side effects of chemotherapy and amyloidosis The patient is profoundly symptomatic from anemia We discussed some of the risks, benefits, and alternatives of blood transfusions. The patient is symptomatic from anemia and the hemoglobin level is critically low.  Some of the side-effects to be expected including risks of transfusion reactions, chills, infection, syndrome of volume overload and risk of hospitalization from various reasons and the patient is willing to proceed and went ahead to sign consent today. I plan to give him 2 units of blood with diuresis after each unit of blood  Chronic diastolic heart failure (HCC) He has evidence of congestive heart failure, likely exacerbated by anemia He is not able to lose the fluid weight Recommend low-dose addition of spironolactone I recommend he call his cardiologist's office for further management  Chronic kidney disease (CKD),  stage III (moderate) He had recent acute on chronic renal failure  He will continue close blood, monitoring during treatment No dose adjustment is needed for Velcade  Fall He has recent accidental fall at home He did not have major injuries but did have some bruises He denies dizziness or lightheadedness as a contributing factor for his fall   Orders Placed This Encounter  Procedures  . Kappa/lambda light chains    Standing Status:   Future    Standing Expiration Date:   06/28/2018  . Multiple Myeloma Panel (SPEP&IFE w/QIG)    Standing Status:   Future    Standing Expiration Date:   06/28/2018   All questions were answered. The patient knows to call the clinic with any problems, questions or concerns. No barriers to learning was detected. I spent 30 minutes counseling the patient face to face. The total time spent in the appointment was 40 minutes and more than 50% was on counseling and review of test results     Artis Delay, MD 05/25/2017  5:31 PM

## 2017-05-25 NOTE — Assessment & Plan Note (Signed)
He has evidence of congestive heart failure, likely exacerbated by anemia He is not able to lose the fluid weight Recommend low-dose addition of spironolactone I recommend he call his cardiologist's office for further management

## 2017-05-25 NOTE — Assessment & Plan Note (Signed)
He had recent acute on chronic renal failure  He will continue close blood, monitoring during treatment No dose adjustment is needed for Velcade

## 2017-05-31 NOTE — Progress Notes (Signed)
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 LHC 10/2009: pLAD 20%, dLAD 30%, mOM2 70-80% (unchanged compared to previous), pRCA 20%, dRCA 20%. Admitted in February of 2012 with a wide complex tachycardia. Seen by EP and rhythm was most likely felt to be SVT with aberrancy. It was felt that if symptoms recur ablation would be warranted. Cardiac MRI (09/05/13): Moderate LVE, focal septal hypertrophy, EF 51%, global HK, mild RVE, normal RVSF, no evidence of cardiac amyloid. Nuclear study in March of 2015 showed apical thinning but no ischemia. Ejection fraction was 48%. Last cardioversion for atrial fibrillation was on 02/06/2016. Monitor 3/17 showed sinus with first degree AV block. Pt requested DC of anticoagulation at previous ov as he had fallen and developed hemarthrosis.Echocardiogram repeated February 2018 and showed ejection fraction 40-45%, moderate left atrial enlargement, moderate tricuspid regurgitation. Since last seen, he notes dyspnea on exertion but no orthopnea or PND. No chest pain or syncope. He does have increased pedal edema and weight gain of 4-5 pounds.  Current Outpatient Prescriptions  Medication Sig Dispense Refill  . acyclovir (ZOVIRAX) 400 MG tablet Take 1 tablet (400 mg total) by mouth daily. 30 tablet 3  . amiodarone (PACERONE) 200 MG tablet Take 1 tablet (200 mg total) by mouth daily. 90 tablet 3  . dexamethasone (DECADRON) 4 MG tablet Take 1 tablet (4 mg total) by mouth daily. 30 tablet 1  . furosemide (LASIX) 40 MG tablet Take 1 tablet (40 mg total) by mouth daily. (Patient taking differently: Take 40 mg by mouth 2 (two) times daily. ) 90 tablet 3  . ondansetron (ZOFRAN) 8 MG tablet Take 1 tablet (8 mg total) by mouth 2 (two) times daily as needed (Nausea or vomiting). 30 tablet 1  . pravastatin (PRAVACHOL) 40 MG tablet Take 1 tablet (40 mg total) by mouth every evening. 90 tablet 3  .  prochlorperazine (COMPAZINE) 10 MG tablet Take 1 tablet (10 mg total) by mouth every 6 (six) hours as needed (Nausea or vomiting). 30 tablet 1  . spironolactone (ALDACTONE) 25 MG tablet Take 1 tablet (25 mg total) by mouth daily. 30 tablet 0   No current facility-administered medications for this visit.    Facility-Administered Medications Ordered in Other Visits  Medication Dose Route Frequency Provider Last Rate Last Dose  . iopamidol (ISOVUE-M) 41 % intrathecal injection 16 mL  16 mL Intrathecal Once Kristeen Miss, MD         Past Medical History:  Diagnosis Date  . Adenomatous colon polyp 2003   max size 68mm  . Amyloidosis    seen at [facility]  . Atrial fibrillation ([place])   . Benign neoplasm of colon   . Cancer (HCC)    amyloidosis  . Cerebrovascular disease, unspecified   . Chronic venous insufficiency 2016  . Coronary atherosclerosis of unspecified type of vessel, native or graft    Non obstructive  . Cough, persistent 09/29/2015  . DDD (degenerative disc disease), lumbar    Dr. Katherine Roan  . Degenerative disc disease   . Diverticulosis of colon (without mention of hemorrhage)   . Edema 08/29/2013  . Hemangioma   . Hx of cardiovascular stress test    Adenosine Myoview (02/2014): Apical thinning, no ischemia, EF 48%; low risk.  Marland Kitchen Hypertension   . LBBB (left bundle branch block)   . Low back pain    chronic, seeing pain management  . Non-pressure chronic ulcer left  lower leg, limited to breakdown skin ([place])    [facility] on 05/29/2015  . OSA (obstructive sleep apnea)   . Pure hypercholesterolemia   . Scrotal pain 10/23/2014  . Shortness of breath dyspnea   . 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 Traumatic open wound of left lower leg with delayed healing    Seen at [facility] on 05/29/2015  . Unspecified hemorrhoids without mention of complication   . Wide-complex tachycardia ([place]) 01/2011   Felt likely be SVT with abbarancy    Past  Surgical History:  Procedure Laterality Date  . BACK SURGERY  2012   Lumbar, [name]  . 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 CARDIOVERSION N/A 07/10/2013   Procedure: CARDIOVERSION;  Surgeon: Lelon Perla, MD;  Location: [facility] ENDOSCOPY;  Service: Cardiovascular;  Laterality: N/A;  . CARDIOVERSION N/A 02/06/2016   Procedure: CARDIOVERSION;  Surgeon: Skeet Latch, MD;  Location: [place];  Service: Cardiovascular;  Laterality: N/A;  . CARPAL TUNNEL RELEASE  08/2006   Dr Doy Mince at [place]    . KNEE ARTHROSCOPY    . Lumbar Medial Branch Block  2017   left L2-5, S1 and right L2-5 and S1 medial branches    Social History   Social History  . Marital status: Married    Spouse name: N/A  . Number of children: 2  . Years of education: N/A   Occupational History  . PRESIDENT [employer]   Social History Main Topics  . Smoking status: Never Smoker  . Smokeless tobacco: Never Used  . Alcohol use 0.0 oz/week     Comment: Occassionally  . Drug use: No  . Sexual activity: No   Other Topics Concern  . Not on file   Social History Narrative   Lives with wife.      Family History  Problem Relation Age of Onset  . Lung cancer Mother   . Skin cancer Father        Melanoma  . Colon cancer Neg Hx   . Colon polyps Neg Hx   . Kidney disease Neg Hx   . Esophageal cancer Neg Hx   . Diabetes Neg Hx   . Gallbladder disease Neg Hx     ROS: no fevers or chills, productive cough, hemoptysis, dysphasia, odynophagia, melena, hematochezia, dysuria, hematuria, rash, seizure activity, orthopnea, PND, claudication. Remaining systems are negative.  Physical Exam: Well-developed well-nourished in no acute distress.  Skin is warm and dry.  HEENT is normal.  Neck is supple.  Chest is clear to auscultation with normal expansion.  Cardiovascular exam is regular rate and rhythm.  Abdominal exam nontender or  distended. No masses palpated. Extremities show 2-3+ edema. neuro grossly intact   A/P  1 Paroxysmal atrial fibrillation- Continue amiodarone. He has declined anticoagulation previously and understands the high risk of CVA.  2 supraventricular tachycardia-continue amiodarone.  3 hyperlipidemia-continue statin.  4 hypertension-blood pressure is controlled. Continue present medications.  5 coronary artery disease-continue statin and ASA.  6 amyloidosis-patient has recurrent amyloidosis which is being managed by oncology.   7 chronic diastolic congestive heart failure-patient is volume overloaded. I will discontinue Lasix and treated with Demadex 20 mg twice a day. Continue spironolactone for now but may need to discontinue if renal insufficiency worsens. Check potassium and renal function in 1 week. Instructed on low sodium diet and fluid restriction.  8 Cardiomyopathy-LV function mildly reduced. No beta blocker given severity of first-degree AV block. I will not add an ACE inhibitor given renal insufficiency.   9 chronic stage III kidney disease-will check potassium and renal function in 1 week.[name], MD

## 2017-06-07 ENCOUNTER — Ambulatory Visit (HOSPITAL_BASED_OUTPATIENT_CLINIC_OR_DEPARTMENT_OTHER): Payer: Medicare Other

## 2017-06-07 ENCOUNTER — Other Ambulatory Visit: Payer: Self-pay | Admitting: Hematology and Oncology

## 2017-06-07 ENCOUNTER — Encounter: Payer: Self-pay | Admitting: Hematology and Oncology

## 2017-06-07 ENCOUNTER — Other Ambulatory Visit (HOSPITAL_BASED_OUTPATIENT_CLINIC_OR_DEPARTMENT_OTHER): Payer: Medicare Other

## 2017-06-07 ENCOUNTER — Ambulatory Visit (HOSPITAL_BASED_OUTPATIENT_CLINIC_OR_DEPARTMENT_OTHER): Payer: Medicare Other | Admitting: Hematology and Oncology

## 2017-06-07 VITALS — BP 132/80 | HR 65 | Temp 98.0°F

## 2017-06-07 VITALS — BP 130/62 | HR 86 | Temp 97.8°F | Resp 20 | Ht 71.0 in | Wt 255.8 lb

## 2017-06-07 DIAGNOSIS — D630 Anemia in neoplastic disease: Secondary | ICD-10-CM

## 2017-06-07 DIAGNOSIS — N183 Chronic kidney disease, stage 3 unspecified: Secondary | ICD-10-CM

## 2017-06-07 DIAGNOSIS — I5032 Chronic diastolic (congestive) heart failure: Secondary | ICD-10-CM | POA: Diagnosis not present

## 2017-06-07 DIAGNOSIS — I872 Venous insufficiency (chronic) (peripheral): Secondary | ICD-10-CM | POA: Diagnosis not present

## 2017-06-07 DIAGNOSIS — I878 Other specified disorders of veins: Secondary | ICD-10-CM

## 2017-06-07 DIAGNOSIS — E859 Amyloidosis, unspecified: Secondary | ICD-10-CM | POA: Diagnosis not present

## 2017-06-07 DIAGNOSIS — Z5112 Encounter for antineoplastic immunotherapy: Secondary | ICD-10-CM | POA: Diagnosis present

## 2017-06-07 DIAGNOSIS — D539 Nutritional anemia, unspecified: Secondary | ICD-10-CM

## 2017-06-07 LAB — COMPREHENSIVE METABOLIC PANEL
ALBUMIN: 3.1 g/dL — AB (ref 3.5–5.0)
ALK PHOS: 140 U/L (ref 40–150)
ALT: 13 U/L (ref 0–55)
AST: 14 U/L (ref 5–34)
Anion Gap: 10 mEq/L (ref 3–11)
BUN: 36.6 mg/dL — AB (ref 7.0–26.0)
CALCIUM: 9 mg/dL (ref 8.4–10.4)
CO2: 27 mEq/L (ref 22–29)
CREATININE: 2 mg/dL — AB (ref 0.7–1.3)
Chloride: 106 mEq/L (ref 98–109)
EGFR: 32 mL/min/{1.73_m2} — ABNORMAL LOW (ref >90)
Glucose: 106 mg/dl (ref 70–140)
POTASSIUM: 4.4 meq/L (ref 3.5–5.1)
Sodium: 142 mEq/L (ref 136–145)
Total Bilirubin: 0.86 mg/dL (ref 0.20–1.20)
Total Protein: 6.3 g/dL — ABNORMAL LOW (ref 6.4–8.3)

## 2017-06-07 LAB — CBC WITH DIFFERENTIAL/PLATELET
BASO%: 1.1 % (ref 0.0–2.0)
Basophils Absolute: 0 10*3/uL (ref 0.0–0.1)
EOS ABS: 0 10*3/uL (ref 0.0–0.5)
EOS%: 1.4 % (ref 0.0–7.0)
HEMATOCRIT: 24.1 % — AB (ref 38.4–49.9)
HEMOGLOBIN: 7.4 g/dL — AB (ref 13.0–17.1)
LYMPH#: 0.5 10*3/uL — AB (ref 0.9–3.3)
LYMPH%: 18.1 % (ref 14.0–49.0)
MCH: 23.1 pg — AB (ref 27.2–33.4)
MCHC: 30.7 g/dL — ABNORMAL LOW (ref 32.0–36.0)
MCV: 75.1 fL — ABNORMAL LOW (ref 79.3–98.0)
MONO#: 0.9 10*3/uL (ref 0.1–0.9)
MONO%: 33.5 % — ABNORMAL HIGH (ref 0.0–14.0)
NEUT#: 1.3 10*3/uL — ABNORMAL LOW (ref 1.5–6.5)
NEUT%: 45.9 % (ref 39.0–75.0)
Platelets: 119 10*3/uL — ABNORMAL LOW (ref 140–400)
RBC: 3.21 10*6/uL — ABNORMAL LOW (ref 4.20–5.82)
RDW: 31 % — ABNORMAL HIGH (ref 11.0–14.6)
WBC: 2.8 10*3/uL — ABNORMAL LOW (ref 4.0–10.3)
nRBC: 2 % — ABNORMAL HIGH (ref 0–0)

## 2017-06-07 LAB — PREPARE RBC (CROSSMATCH)

## 2017-06-07 MED ORDER — ACETAMINOPHEN 325 MG PO TABS: 650.0000 mg | ORAL_TABLET | Freq: Once | ORAL | Status: DC

## 2017-06-07 MED ORDER — SODIUM CHLORIDE 0.9 % IV SOLN
250.0000 mL | Freq: Once | INTRAVENOUS | Status: AC
  Administered 2017-06-07: 250 mL via INTRAVENOUS

## 2017-06-07 MED ORDER — BORTEZOMIB CHEMO SQ INJECTION 3.5 MG (2.5MG/ML)
1.3000 mg/m2 | Freq: Once | INTRAMUSCULAR | Status: AC
  Administered 2017-06-07: 3 mg via SUBCUTANEOUS
  Filled 2017-06-07: qty 3

## 2017-06-07 MED ORDER — FUROSEMIDE 10 MG/ML IJ SOLN
20.0000 mg | Freq: Once | INTRAMUSCULAR | Status: AC
  Administered 2017-06-07: 20 mg via INTRAVENOUS

## 2017-06-07 MED ORDER — DIPHENHYDRAMINE HCL 25 MG PO CAPS: 25.0000 mg | ORAL_CAPSULE | Freq: Once | ORAL | Status: DC

## 2017-06-07 MED ORDER — FUROSEMIDE 10 MG/ML IJ SOLN
INTRAMUSCULAR | Status: AC
Start: 2017-06-07 — End: 2017-06-07
  Filled 2017-06-07: qty 2

## 2017-06-07 MED ORDER — PROCHLORPERAZINE MALEATE 10 MG PO TABS: 10.0000 mg | ORAL_TABLET | Freq: Once | ORAL | Status: DC

## 2017-06-07 NOTE — Assessment & Plan Note (Signed)
The patient has bilateral leg edema and evidence of poor venous circulation He has chronic nonhealing wound With his permission, I took pictures of his legs I recommended referral to wound care center for management but according to the patient, it is improving He declined referral today.

## 2017-06-07 NOTE — Patient Instructions (Signed)
[facility] Discharge Instructions for Patients Receiving Chemotherapy  Today you received the following chemotherapy agents: Velcade  To help prevent nausea and vomiting after your treatment, we encourage you to take your nausea medication as directed.    If you develop nausea and vomiting that is not controlled by your nausea medication, call the clinic.   BELOW ARE SYMPTOMS THAT SHOULD BE REPORTED IMMEDIATELY:  *FEVER GREATER THAN 100.5 F  *CHILLS WITH OR WITHOUT FEVER  NAUSEA AND VOMITING THAT IS NOT CONTROLLED WITH YOUR NAUSEA MEDICATION  *UNUSUAL SHORTNESS OF BREATH  *UNUSUAL BRUISING OR BLEEDING  TENDERNESS IN MOUTH AND THROAT WITH OR WITHOUT PRESENCE OF ULCERS  *URINARY PROBLEMS  *BOWEL PROBLEMS  UNUSUAL RASH Items with * indicate a potential emergency and should be followed up as soon as possible.  Feel free to call the clinic you have any questions or concerns. The clinic phone number is [phone].  Please show the CHEMO ALERT CARD at check-in to the Emergency Department and triage nurse.    Blood Transfusion, Adult A blood transfusion is a procedure in which you receive donated blood, including plasma, platelets, and red blood cells, through an IV tube. You may need a blood transfusion because of illness, surgery, or injury. The blood may come from a donor. You may also be able to donate blood for yourself (autologous blood donation) before a surgery if you know that you might require a blood transfusion. The blood given in a transfusion is made up of different types of cells. You may receive:  Red blood cells. These carry oxygen to the cells in the body.  White blood cells. These help you fight infections.  Platelets. These help your blood to clot.  Plasma. This is the liquid part of your blood and it helps with fluid imbalances.  If you have hemophilia or another clotting disorder, you may also receive other types of blood  products. Tell a health care provider about:  Any allergies you have.  All medicines you are taking, including vitamins, herbs, eye drops, creams, and over-the-counter medicines.  Any problems you or family members have had with anesthetic medicines.  Any blood disorders you have.  Any surgeries you have had.  Any medical conditions you have, including any recent fever or cold symptoms.  Whether you are pregnant or may be pregnant.  Any previous reactions you have had during a blood transfusion. What are the risks? Generally, this is a safe procedure. However, problems may occur, including:  Having an allergic reaction to something in the donated blood. Hives and itching may be symptoms of this type of reaction.  Fever. This may be a reaction to the white blood cells in the transfused blood. Nausea or chest pain may accompany a fever.  Iron overload. This can happen from having many transfusions.  Transfusion-related acute lung injury (TRALI). This is a rare reaction that causes lung damage. The cause is not known.TRALI can occur within hours of a transfusion or several days later.  Sudden (acute) or delayed hemolytic reactions. This happens if your blood does not match the cells in your transfusion. Your body's defense system (immune system) may try to attack the new cells. This complication is rare. The symptoms include fever, chills, nausea, and low back pain or chest pain.  Infection or disease transmission. This is rare.  What happens before the procedure?  You will have a blood test to determine your blood type. This is necessary to know what kind   of blood your body will accept and to match it to the donor blood.  If you are going to have a planned surgery, you may be able to do an autologous blood donation. This may be done in case you need to have a transfusion.  If you have had an allergic reaction to a transfusion in the past, you may be given medicine to help  prevent a reaction. This medicine may be given to you by mouth or through an IV tube.  You will have your temperature, blood pressure, and pulse monitored before the transfusion.  Follow instructions from your health care provider about eating and drinking restrictions.  Ask your health care provider about: ? Changing or stopping your regular medicines. This is especially important if you are taking diabetes medicines or blood thinners. ? Taking medicines such as aspirin and ibuprofen. These medicines can thin your blood. Do not take these medicines before your procedure if your health care provider instructs you not to. What happens during the procedure?  An IV tube will be inserted into one of your veins.  The bag of donated blood will be attached to your IV tube. The blood will then enter through your vein.  Your temperature, blood pressure, and pulse will be monitored regularly during the transfusion. This monitoring is done to detect early signs of a transfusion reaction.  If you have any signs or symptoms of a reaction, your transfusion will be stopped and you may be given medicine.  When the transfusion is complete, your IV tube will be removed.  Pressure may be applied to the IV site for a few minutes.  A bandage (dressing) will be applied. The procedure may vary among health care providers and hospitals. What happens after the procedure?  Your temperature, blood pressure, heart rate, breathing rate, and blood oxygen level will be monitored often.  Your blood may be tested to see how you are responding to the transfusion.  You may be warmed with fluids or blankets to maintain a normal body temperature. Summary  A blood transfusion is a procedure in which you receive donated blood, including plasma, platelets, and red blood cells, through an IV tube.  Your temperature, blood pressure, and pulse will be monitored before, during, and after the transfusion.  Your blood may  be tested after the transfusion to see how your body has responded. This information is not intended to replace advice given to you by your health care provider. Make sure you discuss any questions you have with your health care provider. Document Released: 11/26/2000 Document Revised: 08/26/2016 Document Reviewed: 08/26/2016 Elsevier Interactive Patient Education  2018 [employer]  

## 2017-06-07 NOTE — Assessment & Plan Note (Signed)
He has poor venous access I recommend port placement and he agreed

## 2017-06-07 NOTE — Assessment & Plan Note (Signed)
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 The patient is symptomatic from anemia and the hemoglobin level is critically low.  Some of the side-effects to be expected including risks of transfusion reactions, chills, infection, syndrome of volume overload and risk of hospitalization from various reasons and the patient is willing to proceed and went ahead to sign consent today. I plan to give him 2 units of blood with diuresis after each unit of blood We also discussed the use of darbepoetin injection I will order additional studies next week and consider adding darbepoetin injection in 2 weeks

## 2017-06-07 NOTE — Assessment & Plan Note (Signed)
He had recent acute on chronic renal failure  He will continue close blood, monitoring during treatment No dose adjustment is needed for Velcade

## 2017-06-07 NOTE — Assessment & Plan Note (Signed)
He tolerated treatment well but he is dealing with congestive heart failure and renal failure We will continue weekly dexamethasone along with Velcade I plan to repeat serum light chain after 4 doses of treatment We will continue aggressive supportive care

## 2017-06-07 NOTE — Progress Notes (Signed)
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 He has significant evaluation including bone marrow aspirate and biopsy. 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 The patient subsequently went for maintain dense chemotherapy with Velcade for almost 2 years and then switch to Revlimid. He self discontinue Revlimid in May of 2014 due to fatigue. In July 2015, we restarted him back on Velcade due to a rising light chain. In September 2015, the patient requested to add back dexamethasone at 20 mg every week. In November 2015, the patient reduce dexamethasone to 16 mg every week along with Velcade injection. We discontinued treatment on 11/20/2014 On 05/03/2017, he resumed treatment with Velcade and dexamethasone  INTERVAL HISTORY: Please see below for problem oriented charting. He returns for further follow-up He complained of dizziness and weakness He has gained almost 8 pounds of fluid weight He did not take the diuretic prescription as directed He complained of shortness of breath on minimal exertion He has slow, nonhealing wound on both lower extremities after recent falls with associated weeping but he felt he was improving He denies recent chest pain or syncopal episode Denies recent fever or chills He denies peripheral neuropathy from treatment  REVIEW OF  SYSTEMS:   Constitutional: Denies fevers, chills or abnormal weight loss Eyes: Denies blurriness of vision Ears, nose, mouth, throat, and face: Denies mucositis or sore throat Gastrointestinal:  Denies nausea, heartburn or change in bowel habits Skin: Denies abnormal skin rashes Lymphatics: Denies new lymphadenopathy Neurological:Denies numbness, tingling or new weaknesses Behavioral/Psych: Mood is stable, no new changes  All other systems were reviewed with the patient and are negative.  I have reviewed the past medical history, past surgical history, social history and family history with the patient and they are unchanged from previous note.  ALLERGIES:  has No Known Allergies.  MEDICATIONS:  Current Outpatient Prescriptions  Medication Sig Dispense Refill  . acyclovir (ZOVIRAX) 400 MG tablet Take 1 tablet (400 mg total) by mouth daily. 30 tablet 3  . amiodarone (PACERONE) 200 MG tablet Take 1 tablet (200 mg total) by mouth daily. 90 tablet 3  . dexamethasone (DECADRON) 4 MG tablet Take 1 tablet (4 mg total) by mouth daily. 30 tablet 1  . furosemide (LASIX) 40 MG tablet Take 1 tablet (40 mg total) by mouth daily. (Patient taking differently: Take 40 mg by mouth 2 (two) times daily. ) 90 tablet 3  . ondansetron (ZOFRAN) 8 MG tablet Take 1 tablet (8 mg total) by mouth 2 (two) times daily as needed (Nausea or vomiting). 30 tablet 1  . pravastatin (PRAVACHOL) 40 MG tablet Take 1 tablet (40 mg total) by mouth every evening. 90 tablet 3  . prochlorperazine (COMPAZINE) 10 MG tablet Take 1 tablet (10 mg total) by mouth every 6 (six) hours as needed (Nausea or vomiting). 30 tablet 1  . spironolactone (ALDACTONE) 25 MG tablet Take 1 tablet (25 mg total) by mouth daily. 30 tablet 0   No current facility-administered medications  for this visit.    Facility-Administered Medications Ordered in Other Visits  Medication Dose Route Frequency Provider Last Rate Last Dose  . acetaminophen (TYLENOL)  tablet 650 mg  650 mg Oral Once Alvy Bimler, Malin Sambrano, MD      . diphenhydrAMINE (BENADRYL) capsule 25 mg  25 mg Oral Once Alvy Bimler, Jennfer Gassen, MD      . furosemide (LASIX) injection 20 mg  20 mg Intravenous Once Alvy Bimler, Milos Milligan, MD      . iopamidol (ISOVUE-M) 41 % intrathecal injection 16 mL  16 mL Intrathecal Once Kristeen Miss, MD      . prochlorperazine (COMPAZINE) tablet 10 mg  10 mg Oral Once Heath Lark, MD        PHYSICAL EXAMINATION: ECOG PERFORMANCE STATUS: 2 - Symptomatic, <50% confined to bed  Vitals:   06/07/17 0910  BP: 130/62  Pulse: 86  Resp: 20  Temp: 97.8 F (36.6 C)   Filed Weights   06/07/17 0910  Weight: 255 lb 12.8 oz (116 kg)    GENERAL:alert, no distress and comfortable.  He is morbidly obese SKIN: Noted excessive bruising.  Noted nonhealing wound on his lower extremities EYES: normal, Conjunctiva are pink and non-injected, sclera clear OROPHARYNX:no exudate, no erythema and lips, buccal mucosa, and tongue normal  NECK: supple, thyroid normal size, non-tender, without nodularity LYMPH:  no palpable lymphadenopathy in the cervical, axillary or inguinal LUNGS: clear to auscultation and percussion with normal breathing effort HEART: regular rate & rhythm and no murmurs moderate bilateral lower extremity edema ABDOMEN:abdomen soft, non-tender and normal bowel sounds Musculoskeletal:no cyanosis of digits and no clubbing  NEURO: alert & oriented x 3 with fluent speech, no focal motor/sensory deficits  LABORATORY DATA:  I have reviewed the data as listed    Component Value Date/Time   NA 142 06/07/2017 0847   K 4.4 06/07/2017 0847   CL 102 04/30/2017 1140   CL 103 05/31/2013 0842   CO2 27 06/07/2017 0847   GLUCOSE 106 06/07/2017 0847   GLUCOSE 118 (H) 05/31/2013 0842   BUN 36.6 (H) 06/07/2017 0847   CREATININE 2.0 (H) 06/07/2017 0847   CALCIUM 9.0 06/07/2017 0847   PROT 6.3 (L) 06/07/2017 0847   ALBUMIN 3.1 (L) 06/07/2017 0847   AST 14 06/07/2017 0847   ALT 13  06/07/2017 0847   ALKPHOS 140 06/07/2017 0847   BILITOT 0.86 06/07/2017 0847   GFRNONAA 27 (L) 04/30/2017 1140   GFRNONAA 63 06/10/2014 1221   GFRAA 31 (L) 04/30/2017 1140   GFRAA 73 06/10/2014 1221    No results found for: SPEP, UPEP  Lab Results  Component Value Date   WBC 2.8 (L) 06/07/2017   NEUTROABS 1.3 (L) 06/07/2017   HGB 7.4 (L) 06/07/2017   HCT 24.1 (L) 06/07/2017   MCV 75.1 (L) 06/07/2017   PLT 119 (L) 06/07/2017      Chemistry      Component Value Date/Time   NA 142 06/07/2017 0847   K 4.4 06/07/2017 0847   CL 102 04/30/2017 1140   CL 103 05/31/2013 0842   CO2 27 06/07/2017 0847   BUN 36.6 (H) 06/07/2017 0847   CREATININE 2.0 (H) 06/07/2017 0847      Component Value Date/Time   CALCIUM 9.0 06/07/2017 0847   ALKPHOS 140 06/07/2017 0847   AST 14 06/07/2017 0847   ALT 13 06/07/2017 0847   BILITOT 0.86 06/07/2017 0847         ASSESSMENT & PLAN:  Amyloidosis ([place]) He tolerated treatment well  but he is dealing with congestive heart failure and renal failure We will continue weekly dexamethasone along with Velcade I plan to repeat serum light chain after 4 doses of treatment We will continue aggressive supportive care  Chronic kidney disease (CKD), stage III (moderate) He had recent acute on chronic renal failure  He will continue close blood, monitoring during treatment No dose adjustment is needed for Velcade  Chronic diastolic heart failure ([place]) He has significant weight gain and evidence of fluid overload Previously, I have requested the patient to add spironolactone in addition to Lasix, pending further evaluation by cardiologist He did not take the medication as instructed He might have some evidence of acute exacerbation of heart failure due to severe anemia He would get additional doses of Lasix with blood transfusion today   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 The patient is symptomatic from anemia and the hemoglobin level is critically low.  Some of the side-effects to be expected including risks of transfusion reactions, chills, infection, syndrome of volume overload and risk of hospitalization from various reasons and the patient is willing to proceed and went ahead to sign consent today. I plan to give him 2 units of blood with diuresis after each unit of blood We also discussed the use of darbepoetin injection I will order additional studies next week and consider adding darbepoetin injection in 2 weeks  Chronic venous insufficiency The patient has bilateral leg edema and evidence of poor venous circulation He has chronic nonhealing wound With his permission, I took pictures of his legs I recommended referral to wound care center for management but according to the patient, it is improving He declined referral today.  Poor venous access He has poor venous access I recommend port placement and he agreed    Orders Placed This Encounter  Procedures  . Ferritin    Standing Status:   Future    Standing Expiration Date:   07/12/2018  . Iron and TIBC    Standing Status:   Future    Standing Expiration Date:   07/12/2018  . Sedimentation rate    Standing Status:   Future    Standing Expiration Date:   07/12/2018  . Erythropoietin    Standing Status:   Future    Standing Expiration Date:   07/12/2018  . Vitamin B12    Standing Status:   Future    Standing Expiration Date:   07/12/2018   All questions were answered. The patient knows to call the clinic with any problems, questions or concerns. No barriers to learning was detected. I spent 30 minutes counseling the patient face to face. The total time spent in the appointment was 40 minutes and more than 50% was on counseling and  review of test results     Heath Lark, MD 06/07/2017 12:32 PM

## 2017-06-07 NOTE — Progress Notes (Signed)
Okay to proceed with treatment with labs 06/07/17 per Dr. Alvy Bimler. Pt to receive 2 units of PRBCs.  1110: pt refused tylenol and benadryl, per Dr. Alvy Bimler okay to proceed with blood transfusions.  Pt and VS stable at discharge.

## 2017-06-07 NOTE — Assessment & Plan Note (Signed)
He has significant weight gain and evidence of fluid overload Previously, I have requested the patient to add spironolactone in addition to Lasix, pending further evaluation by cardiologist He did not take the medication as instructed He might have some evidence of acute exacerbation of heart failure due to severe anemia He would get additional doses of Lasix with blood transfusion today

## 2017-06-08 ENCOUNTER — Encounter: Payer: Self-pay | Admitting: Cardiology

## 2017-06-08 ENCOUNTER — Ambulatory Visit (INDEPENDENT_AMBULATORY_CARE_PROVIDER_SITE_OTHER): Payer: Medicare Other | Admitting: Cardiology

## 2017-06-08 VITALS — BP 148/80 | HR 73 | Ht 71.0 in | Wt 258.0 lb

## 2017-06-08 DIAGNOSIS — I2510 Atherosclerotic heart disease of native coronary artery without angina pectoris: Secondary | ICD-10-CM

## 2017-06-08 DIAGNOSIS — E7800 Pure hypercholesterolemia, unspecified: Secondary | ICD-10-CM | POA: Diagnosis not present

## 2017-06-08 DIAGNOSIS — I5032 Chronic diastolic (congestive) heart failure: Secondary | ICD-10-CM | POA: Diagnosis not present

## 2017-06-08 DIAGNOSIS — I480 Paroxysmal atrial fibrillation: Secondary | ICD-10-CM

## 2017-06-08 DIAGNOSIS — I10 Essential (primary) hypertension: Secondary | ICD-10-CM

## 2017-06-08 LAB — KAPPA/LAMBDA LIGHT CHAINS
IG LAMBDA FREE LIGHT CHAIN: 25 mg/L (ref 5.7–26.3)
Ig Kappa Free Light Chain: 150.4 mg/L — ABNORMAL HIGH (ref 3.3–19.4)
KAPPA/LAMBDA FLC RATIO: 6.02 — AB (ref 0.26–1.65)

## 2017-06-08 LAB — BPAM RBC
BLOOD PRODUCT EXPIRATION DATE: 201807242359
Blood Product Expiration Date: 201807242359
ISSUE DATE / TIME: 201806261030
ISSUE DATE / TIME: 201806261030
UNIT TYPE AND RH: 5100
Unit Type and Rh: 5100

## 2017-06-08 LAB — TYPE AND SCREEN
ABO/RH(D): O POS
ANTIBODY SCREEN: NEGATIVE
UNIT DIVISION: 0
Unit division: 0

## 2017-06-08 MED ORDER — TORSEMIDE 20 MG PO TABS: 20.0000 mg | ORAL_TABLET | Freq: Two times a day (BID) | ORAL | 3 refills | Status: DC

## 2017-06-08 NOTE — Patient Instructions (Signed)
Medication Instructions:   STOP FUROSEMIDE  START TORSEMIDE 20 MG ONE TABLET TWICE DAILY  Labwork:  Your physician recommends that you return for lab work ON MONDAY  Follow-Up:  Your physician recommends that you schedule a follow-up appointment   If you need a refill on your cardiac medications before your next appointment, please call your pharmacy.

## 2017-06-09 LAB — MULTIPLE MYELOMA PANEL, SERUM
ALBUMIN/GLOB SERPL: 1.1 (ref 0.7–1.7)
Albumin SerPl Elph-Mcnc: 3.1 g/dL (ref 2.9–4.4)
Alpha 1: 0.3 g/dL (ref 0.0–0.4)
Alpha2 Glob SerPl Elph-Mcnc: 0.7 g/dL (ref 0.4–1.0)
B-Globulin SerPl Elph-Mcnc: 0.8 g/dL (ref 0.7–1.3)
Gamma Glob SerPl Elph-Mcnc: 1.1 g/dL (ref 0.4–1.8)
Globulin, Total: 2.9 g/dL (ref 2.2–3.9)
IGA/IMMUNOGLOBULIN A, SERUM: 210 mg/dL (ref 61–437)
IGM (IMMUNOGLOBIN M), SRM: 62 mg/dL (ref 15–143)
TOTAL PROTEIN: 6 g/dL (ref 6.0–8.5)

## 2017-06-10 ENCOUNTER — Other Ambulatory Visit: Payer: Self-pay | Admitting: Radiology

## 2017-06-13 ENCOUNTER — Ambulatory Visit (HOSPITAL_COMMUNITY)
Admission: RE | Admit: 2017-06-13 | Discharge: 2017-06-13 | Disposition: A | Discharge status: Home / Self Care | Payer: Medicare Other | Source: Ambulatory Visit | Attending: Hematology and Oncology | Admitting: Hematology and Oncology

## 2017-06-13 ENCOUNTER — Other Ambulatory Visit: Payer: Self-pay | Admitting: Hematology and Oncology

## 2017-06-13 ENCOUNTER — Encounter (HOSPITAL_COMMUNITY): Payer: Self-pay

## 2017-06-13 DIAGNOSIS — N5082 Scrotal pain: Secondary | ICD-10-CM | POA: Insufficient documentation

## 2017-06-13 DIAGNOSIS — M5136 Other intervertebral disc degeneration, lumbar region: Secondary | ICD-10-CM | POA: Diagnosis not present

## 2017-06-13 DIAGNOSIS — I509 Heart failure, unspecified: Secondary | ICD-10-CM | POA: Diagnosis not present

## 2017-06-13 DIAGNOSIS — I4891 Unspecified atrial fibrillation: Secondary | ICD-10-CM | POA: Diagnosis not present

## 2017-06-13 DIAGNOSIS — I872 Venous insufficiency (chronic) (peripheral): Secondary | ICD-10-CM | POA: Insufficient documentation

## 2017-06-13 DIAGNOSIS — E859 Amyloidosis, unspecified: Secondary | ICD-10-CM

## 2017-06-13 DIAGNOSIS — I2510 Atherosclerotic heart disease of native coronary artery without angina pectoris: Secondary | ICD-10-CM | POA: Diagnosis not present

## 2017-06-13 DIAGNOSIS — E7800 Pure hypercholesterolemia, unspecified: Secondary | ICD-10-CM | POA: Diagnosis not present

## 2017-06-13 DIAGNOSIS — I679 Cerebrovascular disease, unspecified: Secondary | ICD-10-CM | POA: Diagnosis not present

## 2017-06-13 DIAGNOSIS — I447 Left bundle-branch block, unspecified: Secondary | ICD-10-CM | POA: Diagnosis not present

## 2017-06-13 DIAGNOSIS — I130 Hypertensive heart and chronic kidney disease with heart failure and stage 1 through stage 4 chronic kidney disease, or unspecified chronic kidney disease: Secondary | ICD-10-CM | POA: Diagnosis not present

## 2017-06-13 DIAGNOSIS — N189 Chronic kidney disease, unspecified: Secondary | ICD-10-CM | POA: Diagnosis not present

## 2017-06-13 DIAGNOSIS — G4733 Obstructive sleep apnea (adult) (pediatric): Secondary | ICD-10-CM | POA: Diagnosis not present

## 2017-06-13 DIAGNOSIS — Z452 Encounter for adjustment and management of vascular access device: Secondary | ICD-10-CM | POA: Diagnosis not present

## 2017-06-13 DIAGNOSIS — D630 Anemia in neoplastic disease: Secondary | ICD-10-CM

## 2017-06-13 DIAGNOSIS — K5730 Diverticulosis of large intestine without perforation or abscess without bleeding: Secondary | ICD-10-CM | POA: Insufficient documentation

## 2017-06-13 DIAGNOSIS — D631 Anemia in chronic kidney disease: Secondary | ICD-10-CM | POA: Diagnosis not present

## 2017-06-13 HISTORY — PX: IR US GUIDE VASC ACCESS RIGHT: IMG2390

## 2017-06-13 HISTORY — PX: IR FLUORO GUIDE PORT INSERTION RIGHT: IMG5741

## 2017-06-13 LAB — PROTIME-INR
INR: 1.19
Prothrombin Time: 15.1 seconds (ref 11.4–15.2)

## 2017-06-13 LAB — BASIC METABOLIC PANEL
Anion gap: 7 (ref 5–15)
BUN: 36 mg/dL — AB (ref 6–20)
CHLORIDE: 108 mmol/L (ref 101–111)
CO2: 27 mmol/L (ref 22–32)
CREATININE: 1.68 mg/dL — AB (ref 0.61–1.24)
Calcium: 8.5 mg/dL — ABNORMAL LOW (ref 8.9–10.3)
GFR calc Af Amer: 43 mL/min — ABNORMAL LOW (ref >60)
GFR calc non Af Amer: 37 mL/min — ABNORMAL LOW (ref >60)
GLUCOSE: 91 mg/dL (ref 65–99)
Potassium: 4 mmol/L (ref 3.5–5.1)
SODIUM: 142 mmol/L (ref 135–145)

## 2017-06-13 LAB — CBC
HCT: 29 % — ABNORMAL LOW (ref 39.0–52.0)
Hemoglobin: 9.3 g/dL — ABNORMAL LOW (ref 13.0–17.0)
MCH: 24.8 pg — ABNORMAL LOW (ref 26.0–34.0)
MCHC: 32.1 g/dL (ref 30.0–36.0)
MCV: 77.3 fL — AB (ref 78.0–100.0)
PLATELETS: 64 10*3/uL — AB (ref 150–400)
RBC: 3.75 MIL/uL — ABNORMAL LOW (ref 4.22–5.81)
RDW: 28.7 % — AB (ref 11.5–15.5)
WBC: 1.9 10*3/uL — AB (ref 4.0–10.5)

## 2017-06-13 LAB — APTT: APTT: 29 s (ref 24–36)

## 2017-06-13 MED ORDER — MIDAZOLAM HCL 2 MG/2ML IJ SOLN
INTRAMUSCULAR | Status: AC
  Filled 2017-06-13: qty 4

## 2017-06-13 MED ORDER — SODIUM CHLORIDE 0.9 % IV SOLN
INTRAVENOUS | Status: DC
  Administered 2017-06-13: 12:00:00 via INTRAVENOUS

## 2017-06-13 MED ORDER — FENTANYL CITRATE (PF) 100 MCG/2ML IJ SOLN
INTRAMUSCULAR | Status: AC
  Filled 2017-06-13: qty 4

## 2017-06-13 MED ORDER — FENTANYL CITRATE (PF) 100 MCG/2ML IJ SOLN
INTRAMUSCULAR | Status: AC | PRN
  Administered 2017-06-13 (×2): 50 ug via INTRAVENOUS

## 2017-06-13 MED ORDER — CEFAZOLIN SODIUM-DEXTROSE 2-4 GM/100ML-% IV SOLN
INTRAVENOUS | Status: AC
  Administered 2017-06-13: 2 g via INTRAVENOUS
  Filled 2017-06-13: qty 100

## 2017-06-13 MED ORDER — HEPARIN SOD (PORK) LOCK FLUSH 100 UNIT/ML IV SOLN
INTRAVENOUS | Status: AC
  Filled 2017-06-13: qty 5

## 2017-06-13 MED ORDER — MIDAZOLAM HCL 2 MG/2ML IJ SOLN
INTRAMUSCULAR | Status: AC | PRN
  Administered 2017-06-13 (×2): 1 mg via INTRAVENOUS

## 2017-06-13 MED ORDER — CEFAZOLIN SODIUM-DEXTROSE 2-4 GM/100ML-% IV SOLN
2.0000 g | INTRAVENOUS | Status: AC
  Administered 2017-06-13: 2 g via INTRAVENOUS

## 2017-06-13 MED ORDER — LIDOCAINE HCL 1 % IJ SOLN
INTRAMUSCULAR | Status: AC | PRN
  Administered 2017-06-13: 20 mL

## 2017-06-13 MED ORDER — LIDOCAINE HCL 1 % IJ SOLN
INTRAMUSCULAR | Status: AC
  Filled 2017-06-13: qty 20

## 2017-06-13 NOTE — Discharge Instructions (Signed)
Moderate Conscious Sedation, Adult, Care After °These instructions provide you with information about caring for yourself after your procedure. Your health care provider may also give you more specific instructions. Your treatment has been planned according to current medical practices, but problems sometimes occur. Call your health care provider if you have any problems or questions after your procedure. °What can I expect after the procedure? °After your procedure, it is common: °· To feel sleepy for several hours. °· To feel clumsy and have poor balance for several hours. °· To have poor judgment for several hours. °· To vomit if you eat too soon. ° °Follow these instructions at home: °For at least 24 hours after the procedure: ° °· Do not: °? Participate in activities where you could fall or become injured. °? Drive. °? Use heavy machinery. °? Drink alcohol. °? Take sleeping pills or medicines that cause drowsiness. °? Make important decisions or sign legal documents. °? Take care of children on your own. °· Rest. °Eating and drinking °· Follow the diet recommended by your health care provider. °· If you vomit: °? Drink water, juice, or soup when you can drink without vomiting. °? Make sure you have little or no nausea before eating solid foods. °General instructions °· Have a responsible adult stay with you until you are awake and alert. °· Take over-the-counter and prescription medicines only as told by your health care provider. °· If you smoke, do not smoke without supervision. °· Keep all follow-up visits as told by your health care provider. This is important. °Contact a health care provider if: °· You keep feeling nauseous or you keep vomiting. °· You feel light-headed. °· You develop a rash. °· You have a fever. °Get help right away if: °· You have trouble breathing. °This information is not intended to replace advice given to you by your health care provider. Make sure you discuss any questions you have  with your health care provider. °Document Released: 09/19/2013 Document Revised: 05/03/2016 Document Reviewed: 03/20/2016 °Elsevier Interactive Patient Education © 2018 [employer] ° ° °Implanted Port Home Guide °An implanted port is a type of central line that is placed under the skin. Central lines are used to provide IV access when treatment or nutrition needs to be given through a person’s veins. Implanted ports are used for long-term IV access. An implanted port may be placed because: °· You need IV medicine that would be irritating to the small veins in your hands or arms. °· You need long-term IV medicines, such as antibiotics. °· You need IV nutrition for a long period. °· You need frequent blood draws for lab tests. °· You need dialysis. ° °Implanted ports are usually placed in the chest area, but they can also be placed in the upper arm, the abdomen, or the leg. An implanted port has two main parts: °· Reservoir. The reservoir is round and will appear as a small, raised area under your skin. The reservoir is the part where a needle is inserted to give medicines or draw blood. °· Catheter. The catheter is a thin, flexible tube that extends from the reservoir. The catheter is placed into a large vein. Medicine that is inserted into the reservoir goes into the catheter and then into the vein. ° °How will I care for my incision site? °Do not get the incision site wet. Bathe or shower as directed by your health care provider. °How is my port accessed? °Special steps must be taken to access the   port: °· Before the port is accessed, a numbing cream can be placed on the skin. This helps numb the skin over the port site. °· Your health care provider uses a sterile technique to access the port. °? Your health care provider must put on a mask and sterile gloves. °? The skin over your port is cleaned carefully with an antiseptic and allowed to dry. °? The port is gently pinched between sterile gloves, and a needle  is inserted into the port. °· Only "non-coring" port needles should be used to access the port. Once the port is accessed, a blood return should be checked. This helps ensure that the port is in the vein and is not clogged. °· If your port needs to remain accessed for a constant infusion, a clear (transparent) bandage will be placed over the needle site. The bandage and needle will need to be changed every week, or as directed by your health care provider. °· Keep the bandage covering the needle clean and dry. Do not get it wet. Follow your health care provider’s instructions on how to take a shower or bath while the port is accessed. °· If your port does not need to stay accessed, no bandage is needed over the port. ° °What is flushing? °Flushing helps keep the port from getting clogged. Follow your health care provider’s instructions on how and when to flush the port. Ports are usually flushed with saline solution or a medicine called heparin. The need for flushing will depend on how the port is used. °· If the port is used for intermittent medicines or blood draws, the port will need to be flushed: °? After medicines have been given. °? After blood has been drawn. °? As part of routine maintenance. °· If a constant infusion is running, the port may not need to be flushed. ° °How long will my port stay implanted? °The port can stay in for as long as your health care provider thinks it is needed. When it is time for the port to come out, surgery will be done to remove it. The procedure is similar to the one performed when the port was put in. °When should I seek immediate medical care? °When you have an implanted port, you should seek immediate medical care if: °· You notice a bad smell coming from the incision site. °· You have swelling, redness, or drainage at the incision site. °· You have more swelling or pain at the port site or the surrounding area. °· You have a fever that is not controlled with  medicine. ° °This information is not intended to replace advice given to you by your health care provider. Make sure you discuss any questions you have with your health care provider. °Document Released: 11/29/2005 Document Revised: 05/06/2016 Document Reviewed: 08/06/2013 °Elsevier Interactive Patient Education © 2017 [employer]. ° ° °Implanted Port Insertion, Care After °This sheet gives you information about how to care for yourself after your procedure. Your health care provider may also give you more specific instructions. If you have problems or questions, contact your health care provider. °What can I expect after the procedure? °After your procedure, it is common to have: °· Discomfort at the port insertion site. °· Bruising on the skin over the port. This should improve over 3-4 days. ° °Follow these instructions at home: °Port care °· After your port is placed, you will get a manufacturer's information card. The card has information about your port. Keep this card with   you at all times. °· Take care of the port as told by your health care provider. Ask your health care provider if you or a family member can get training for taking care of the port at home. A home health care nurse may also take care of the port. °· Make sure to remember what type of port you have. °Incision care °· Follow instructions from your health care provider about how to take care of your port insertion site. Make sure you: °? Wash your hands with soap and water before you change your bandage (dressing). If soap and water are not available, use hand sanitizer. °? Change your dressing as told by your health care provider. °? Leave stitches (sutures), skin glue, or adhesive strips in place. These skin closures may need to stay in place for 2 weeks or longer. If adhesive strip edges start to loosen and curl up, you may trim the loose edges. Do not remove adhesive strips completely unless your health care provider tells you to do  that. °· Check your port insertion site every day for signs of infection. Check for: °? More redness, swelling, or pain. °? More fluid or blood. °? Warmth. °? Pus or a bad smell. °General instructions °· Do not take baths, swim, or use a hot tub until your health care provider approves. °· Do not lift anything that is heavier than 10 lb (4.5 kg) for a week, or as told by your health care provider. °· Ask your health care provider when it is okay to: °? Return to work or school. °? Resume usual physical activities or sports. °· Do not drive for 24 hours if you were given a medicine to help you relax (sedative). °· Take over-the-counter and prescription medicines only as told by your health care provider. °· Wear a medical alert bracelet in case of an emergency. This will tell any health care providers that you have a port. °· Keep all follow-up visits as told by your health care provider. This is important. °Contact a health care provider if: °· You cannot flush your port with saline as directed, or you cannot draw blood from the port. °· You have a fever or chills. °· You have more redness, swelling, or pain around your port insertion site. °· You have more fluid or blood coming from your port insertion site. °· Your port insertion site feels warm to the touch. °· You have pus or a bad smell coming from the port insertion site. °Get help right away if: °· You have chest pain or shortness of breath. °· You have bleeding from your port that you cannot control. °Summary °· Take care of the port as told by your health care provider. °· Change your dressing as told by your health care provider. °· Keep all follow-up visits as told by your health care provider. °This information is not intended to replace advice given to you by your health care provider. Make sure you discuss any questions you have with your health care provider. °Document Released: 09/19/2013 Document Revised: 10/20/2016 Document Reviewed:  10/20/2016 °Elsevier Interactive Patient Education © 2017 Elsevier Inc. ° ° °

## 2017-06-13 NOTE — Procedures (Signed)
Interventional Radiology Procedure Note  Procedure: Placement of a right IJ approach single lumen PowerPort.  Tip is positioned at the superior cavoatrial junction and catheter is ready for immediate use.  Complications: No immediate Recommendations:  - Ok to shower tomorrow - Do not submerge for 7 days - Routine line care   Johnny Mitchell, M.D Pager:  [phone]   

## 2017-06-13 NOTE — Consult Note (Signed)
Chief Complaint: Patient was seen in consultation today for Port-A-Cath placement  Referring Physician(s): [place]  Supervising Physician: Aletta Edouard  Patient Status: [facility] - Out-pt  History of Present Illness: Johnny Mitchell is a 76 y.o. male with history of systemic amyloidosis, chronic kidney disease, CHF, anemia, chronic venous insufficiency as well as poor venous access who presents today for Port-A-Cath placement .  Past Medical History:  Diagnosis Date  . Adenomatous colon polyp 2003   max size 62mm  . Amyloidosis    seen at [facility]  . Atrial fibrillation ([place])   . Benign neoplasm of colon   . Cancer (HCC)    amyloidosis  . Cerebrovascular disease, unspecified   . Chronic venous insufficiency 2016  . Coronary atherosclerosis of unspecified type of vessel, native or graft    Non obstructive  . Cough, persistent 09/29/2015  . DDD (degenerative disc disease), lumbar    Dr. Katherine Roan  . Degenerative disc disease   . Diverticulosis of colon (without mention of hemorrhage)   . Edema 08/29/2013  . Hemangioma   . Hx of cardiovascular stress test    Adenosine Myoview (02/2014): Apical thinning, no ischemia, EF 48%; low risk.  Marland Kitchen Hypertension   . LBBB (left bundle branch block)   . Low back pain    chronic, seeing pain management  . 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 OSA (obstructive sleep apnea)   . Pure hypercholesterolemia   . Scrotal pain 10/23/2014  . Shortness of breath dyspnea   . 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 Traumatic open wound of left lower leg with delayed healing    Seen at [facility] on 05/29/2015  . Unspecified hemorrhoids without mention of complication   . 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 BACK SURGERY  2012   Lumbar, [name]  .  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 CARDIOVERSION N/A 07/10/2013   Procedure: CARDIOVERSION;  Surgeon: Lelon Perla, MD;  Location: [facility] ENDOSCOPY;  Service: Cardiovascular;  Laterality: N/A;  . CARDIOVERSION N/A 02/06/2016   Procedure: CARDIOVERSION;  Surgeon: Skeet Latch, MD;  Location: [place];  Service: Cardiovascular;  Laterality: N/A;  . CARPAL TUNNEL RELEASE  08/2006   Dr Doy Mince at [place]    . KNEE ARTHROSCOPY    . Lumbar Medial Branch Block  2017   left L2-5, S1 and right L2-5 and S1 medial branches    Allergies: Patient has no known allergies.  Medications: Prior to Admission medications   Medication Sig Start Date End Date Taking? Authorizing Provider  acyclovir (ZOVIRAX) 400 MG tablet Take 1 tablet (400 mg total) by mouth daily. 04/25/17  Yes Gorsuch, Ni, MD  amiodarone (PACERONE) 200 MG tablet Take 1 tablet (200 mg total) by mouth daily. 03/21/17  Yes Lelon Perla, MD  spironolactone (ALDACTONE) 25 MG tablet Take 1 tablet (25 mg total) by mouth daily. 05/24/17  Yes Gorsuch, Ni, MD  torsemide (DEMADEX) 20 MG tablet Take 1 tablet (20 mg total) by mouth 2 (two) times daily. 06/08/17 09/06/17 Yes Lelon Perla, MD  dexamethasone (DECADRON) 4 MG tablet Take 1 tablet (4 mg total) by mouth daily. 04/25/17   Heath Lark, MD  ondansetron (ZOFRAN) 8 MG tablet Take  1 tablet (8 mg total) by mouth 2 (two) times daily as needed (Nausea or vomiting). 04/25/17   Heath Lark, MD  pravastatin (PRAVACHOL) 40 MG tablet Take 1 tablet (40 mg total) by mouth every evening. 03/21/17 06/19/17  Lelon Perla, MD  prochlorperazine (COMPAZINE) 10 MG tablet Take 1 tablet (10 mg total) by mouth every 6 (six) hours as needed (Nausea or vomiting). 04/25/17   Heath Lark, MD     Family History  Problem Relation Age of Onset  . Lung cancer Mother   . Skin cancer Father        Melanoma  . Colon cancer Neg Hx   . Colon polyps Neg Hx   . Kidney disease Neg  Hx   . Esophageal cancer Neg Hx   . Diabetes Neg Hx   . Gallbladder disease Neg Hx     Social History   Social History  . Marital status: Married    Spouse name: N/A  . Number of children: 2  . Years of education: N/A   Occupational History  . PRESIDENT [employer]   Social History Main Topics  . Smoking status: Never Smoker  . Smokeless tobacco: Never Used  . Alcohol use 0.0 oz/week     Comment: Occassionally  . Drug use: No  . Sexual activity: No   Other Topics Concern  . None   Social History Narrative   Lives with wife.        Review of Systems  currently denies fever, headache, chest pain, cough, abdominal/back pain, nausea, vomiting or bleeding. He does have some dyspnea with exertion, easy bruising and lower extremity swelling Vital Signs: BP 124/68   Pulse 70   Temp 97.9 F (36.6 C) (Oral)   SpO2 100%   Physical Exam awake, alert. Chest with diminished breath sounds bases, heart with regular rate and rhythm. Abdomen soft, obese, positive bowel sounds, nontender. Lower extremities with moderate edema bilaterally, scattered superficial skin tears with some scab formation  Mallampati Score:     Imaging: No results found.  Labs:  CBC:  Recent Labs  05/10/17 1436 05/17/17 0823 05/24/17 0812 06/07/17 0847  WBC 3.1* 3.7* 3.1* 2.8*  HGB 9.1* 8.5* 7.5* 7.4*  HCT 29.9* 27.8* 24.6* 24.1*  PLT 289 207 105 Large platelets present* 119*    COAGS:  Recent Labs  06/13/17 1152  INR 1.19  APTT 29    BMP:  Recent Labs  01/24/17 1629 03/21/17 1624  04/30/17 1140  05/17/17 0823 05/24/17 0812 06/07/17 0847 06/13/17 1152  NA 140 143  < > 138  < > 140 142 142 142  K 4.7 4.0  < > 3.6  < > 3.7 4.3 4.4 4.0  CL 102 105  --  102  --   --   --   --  108  CO2 30 29  < > 27  < > 27 28 27 27   GLUCOSE 117* 92  < > 93  < > 101 84 106 91  BUN 36* 31*  < > 37*  < > 38.7* 35.7* 36.6* 36*  CALCIUM 8.7 8.4*  < > 8.5*  < >  8.4 8.9 9.0 8.5*  CREATININE 1.84* 1.82*  < > 2.23*  < > 2.0* 2.0* 2.0* 1.68*  GFRNONAA  --   --   --  27*  --   --   --   --  37*  GFRAA  --   --   --  31*  --   --   --   --  43*  < > = values in this interval not displayed.  LIVER FUNCTION TESTS:  Recent Labs  05/10/17 1436 05/17/17 0823 05/24/17 0812 06/07/17 0847 06/07/17 0847  BILITOT 1.27* 0.83 0.79 0.86  --   AST 16 19 17 14   --   ALT 19 29 16 13   --   ALKPHOS 117 128 121 140  --   PROT 7.0 6.2* 6.0* 6.3* 6.0  ALBUMIN 3.3* 3.1* 3.0* 3.1*  --     TUMOR MARKERS: No results for input(s): AFPTM, CEA, CA199, CHROMGRNA in the last 8760 hours.  Assessment and Plan: 76 y.o. male with history of systemic amyloidosis, chronic kidney disease, CHF, anemia, chronic venous insufficiency as well as poor venous access who presents today for Port-A-Cath placement .Risks and benefits discussed with the patient/spouse including, but not limited to bleeding, infection, pneumothorax, or fibrin sheath development and need for additional procedures.All of the patient's questions were answered, patient is agreeable to proceed. Consent signed and in chart.Labs pending.      Thank you for this interesting consult.  I greatly enjoyed meeting Johnny Mitchell and look forward to participating in their care.  A copy of this report was sent to the requesting provider on this date.  Electronically Signed: D. Rowe Robert, PA-C 06/13/2017, 12:41 PM   I spent a total of 20 minutes  in face to face in clinical consultation, greater than 50% of which was counseling/coordinating care for Port-A-Cath placement

## 2017-06-14 ENCOUNTER — Ambulatory Visit (HOSPITAL_BASED_OUTPATIENT_CLINIC_OR_DEPARTMENT_OTHER): Payer: Medicare Other

## 2017-06-14 ENCOUNTER — Other Ambulatory Visit (HOSPITAL_BASED_OUTPATIENT_CLINIC_OR_DEPARTMENT_OTHER): Payer: Medicare Other

## 2017-06-14 VITALS — BP 122/54 | HR 65 | Temp 97.9°F | Resp 20

## 2017-06-14 DIAGNOSIS — N183 Chronic kidney disease, stage 3 unspecified: Secondary | ICD-10-CM

## 2017-06-14 DIAGNOSIS — D630 Anemia in neoplastic disease: Secondary | ICD-10-CM

## 2017-06-14 DIAGNOSIS — E859 Amyloidosis, unspecified: Secondary | ICD-10-CM

## 2017-06-14 DIAGNOSIS — Z5112 Encounter for antineoplastic immunotherapy: Secondary | ICD-10-CM

## 2017-06-14 DIAGNOSIS — D539 Nutritional anemia, unspecified: Secondary | ICD-10-CM | POA: Diagnosis not present

## 2017-06-14 LAB — CBC WITH DIFFERENTIAL/PLATELET
BASO%: 0.5 % (ref 0.0–2.0)
BASOS ABS: 0 10*3/uL (ref 0.0–0.1)
EOS%: 5.2 % (ref 0.0–7.0)
Eosinophils Absolute: 0.1 10*3/uL (ref 0.0–0.5)
HCT: 28.8 % — ABNORMAL LOW (ref 38.4–49.9)
HGB: 8.9 g/dL — ABNORMAL LOW (ref 13.0–17.1)
LYMPH%: 27.2 % (ref 14.0–49.0)
MCH: 24.6 pg — AB (ref 27.2–33.4)
MCHC: 30.9 g/dL — ABNORMAL LOW (ref 32.0–36.0)
MCV: 79.6 fL (ref 79.3–98.0)
MONO#: 0.7 10*3/uL (ref 0.1–0.9)
MONO%: 34.6 % — AB (ref 0.0–14.0)
NEUT#: 0.6 10*3/uL — ABNORMAL LOW (ref 1.5–6.5)
NEUT%: 32.5 % — AB (ref 39.0–75.0)
Platelets: 75 10*3/uL — ABNORMAL LOW (ref 140–400)
RBC: 3.62 10*6/uL — AB (ref 4.20–5.82)
RDW: 29.1 % — ABNORMAL HIGH (ref 11.0–14.6)
WBC: 1.9 10*3/uL — ABNORMAL LOW (ref 4.0–10.3)
lymph#: 0.5 10*3/uL — ABNORMAL LOW (ref 0.9–3.3)
nRBC: 2 % — ABNORMAL HIGH (ref 0–0)

## 2017-06-14 LAB — IRON AND TIBC
%SAT: 50 % (ref 20–55)
IRON: 115 ug/dL (ref 42–163)
TIBC: 229 ug/dL (ref 202–409)
UIBC: 114 ug/dL — ABNORMAL LOW (ref 117–376)

## 2017-06-14 LAB — COMPREHENSIVE METABOLIC PANEL
ALBUMIN: 3.1 g/dL — AB (ref 3.5–5.0)
ALK PHOS: 96 U/L (ref 40–150)
ALT: 12 U/L (ref 0–55)
AST: 13 U/L (ref 5–34)
Anion Gap: 5 mEq/L (ref 3–11)
BUN: 32 mg/dL — ABNORMAL HIGH (ref 7.0–26.0)
CO2: 28 meq/L (ref 22–29)
Calcium: 8.7 mg/dL (ref 8.4–10.4)
Chloride: 108 mEq/L (ref 98–109)
Creatinine: 1.7 mg/dL — ABNORMAL HIGH (ref 0.7–1.3)
EGFR: 37 mL/min/{1.73_m2} — AB (ref >90)
GLUCOSE: 107 mg/dL (ref 70–140)
POTASSIUM: 4.4 meq/L (ref 3.5–5.1)
SODIUM: 141 meq/L (ref 136–145)
Total Bilirubin: 1.34 mg/dL — ABNORMAL HIGH (ref 0.20–1.20)
Total Protein: 6 g/dL — ABNORMAL LOW (ref 6.4–8.3)

## 2017-06-14 LAB — FERRITIN: Ferritin: 468 ng/ml — ABNORMAL HIGH (ref 22–316)

## 2017-06-14 LAB — TECHNOLOGIST REVIEW

## 2017-06-14 MED ORDER — PROCHLORPERAZINE MALEATE 10 MG PO TABS: 10.0000 mg | ORAL_TABLET | Freq: Once | ORAL | Status: DC

## 2017-06-14 MED ORDER — PROCHLORPERAZINE MALEATE 10 MG PO TABS
ORAL_TABLET | ORAL | Status: AC
  Filled 2017-06-14: qty 1

## 2017-06-14 MED ORDER — BORTEZOMIB CHEMO SQ INJECTION 3.5 MG (2.5MG/ML)
1.3000 mg/m2 | Freq: Once | INTRAMUSCULAR | Status: AC
  Administered 2017-06-14: 3 mg via SUBCUTANEOUS
  Filled 2017-06-14: qty 3

## 2017-06-14 NOTE — Patient Instructions (Signed)
[facility] Discharge Instructions for Patients Receiving Chemotherapy  Today you received the following chemotherapy agents Velcade  To help prevent nausea and vomiting after your treatment, we encourage you to take your nausea medication    If you develop nausea and vomiting that is not controlled by your nausea medication, call the clinic.   BELOW ARE SYMPTOMS THAT SHOULD BE REPORTED IMMEDIATELY:  *FEVER GREATER THAN 100.5 F  *CHILLS WITH OR WITHOUT FEVER  NAUSEA AND VOMITING THAT IS NOT CONTROLLED WITH YOUR NAUSEA MEDICATION  *UNUSUAL SHORTNESS OF BREATH  *UNUSUAL BRUISING OR BLEEDING  TENDERNESS IN MOUTH AND THROAT WITH OR WITHOUT PRESENCE OF ULCERS  *URINARY PROBLEMS  *BOWEL PROBLEMS  UNUSUAL RASH Items with * indicate a potential emergency and should be followed up as soon as possible.  Feel free to call the clinic you have any questions or concerns. The clinic phone number is [phone].  Please show the CHEMO ALERT CARD at check-in to the Emergency Department and triage nurse.   

## 2017-06-15 LAB — SEDIMENTATION RATE: SED RATE: 2 mm/h (ref 0–30)

## 2017-06-15 LAB — ERYTHROPOIETIN: Erythropoietin: 38.1 m[IU]/mL — ABNORMAL HIGH (ref 2.6–18.5)

## 2017-06-15 LAB — VITAMIN B12: Vitamin B12: 180 pg/mL — ABNORMAL LOW (ref 232–1245)

## 2017-06-16 NOTE — Progress Notes (Addendum)
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 LHC 10/2009: pLAD 20%, dLAD 30%, mOM2 70-80% (unchanged compared to previous), pRCA 20%, dRCA 20%. Admitted in February of 2012 with a wide complex tachycardia. Seen by EP and rhythm was most likely felt to be SVT with aberrancy. It was felt that if symptoms recur ablation would be warranted. Cardiac MRI (09/05/13): Moderate LVE, focal septal hypertrophy, EF 51%, global HK, mild RVE, normal RVSF, no evidence of cardiac amyloid. Nuclear study in March of 2015 showed apical thinning but no ischemia. Ejection fraction was 48%. Last cardioversion for atrial fibrillation was on 02/06/2016. Monitor 3/17 showed sinus with first degree AV block. Pt requested DC of anticoagulation at previous ov as he had fallen and developed hemarthrosis.Echocardiogram repeated February 2018 and showed ejection fraction 40-45%, moderate left atrial enlargement, moderate tricuspid regurgitation. Lasix changed to demadex at last ov. Patient recently discharged from the hospital following an episode of syncope felt to be orthostatic mediated. His Demadex dose was decreased. Since last seen, patient has had progressive dyspnea on exertion. No orthopnea or PND. His pedal edema is worse. He denies chest pain, palpitations. He has fallen occasionally.   Current Outpatient Prescriptions  Medication Sig Dispense Refill  . amiodarone (PACERONE) 200 MG tablet Take 1 tablet (200 mg total) by mouth daily. 90 tablet 3  . dexamethasone (DECADRON) 4 MG tablet Take 1 tablet (4 mg total) by mouth daily. (Patient taking differently: Take 4 mg by mouth every Monday. THE DAY PRIOR TO CHEMO) 30 tablet 1  . folic acid (FOLVITE) 563 MCG tablet Take 800 mcg by mouth daily.    . pravastatin (PRAVACHOL) 40 MG tablet Take 1 tablet (40 mg total) by mouth every evening. 90 tablet 3  . spironolactone (ALDACTONE) 25 MG tablet Take 1 tablet (25  mg total) by mouth daily. 30 tablet 0  . torsemide (DEMADEX) 20 MG tablet Take 1 tablet (20 mg total) by mouth daily. 180 tablet 3   No current facility-administered medications for this visit.    Facility-Administered Medications Ordered in Other Visits  Medication Dose Route Frequency Provider Last Rate Last Dose  . iopamidol (ISOVUE-M) 41 % intrathecal injection 16 mL  16 mL Intrathecal Once Kristeen Miss, MD         Past Medical History:  Diagnosis Date  . Adenomatous colon polyp 2003   max size 11mm  . Amyloidosis    seen at [facility]  . Atrial fibrillation ([place])   . Benign neoplasm of colon   . Cancer (HCC)    amyloidosis  . Cerebrovascular disease, unspecified   . Chronic venous insufficiency 2016  . Coronary atherosclerosis of unspecified type of vessel, native or graft    Non obstructive  . Cough, persistent 09/29/2015  . DDD (degenerative disc disease), lumbar    Dr. Katherine Roan  . Degenerative disc disease   . Diverticulosis of colon (without mention of hemorrhage)   . Edema 08/29/2013  . Hemangioma   . Hx of cardiovascular stress test    Adenosine Myoview (02/2014): Apical thinning, no ischemia, EF 48%; low risk.  Marland Kitchen Hypertension   . LBBB (left bundle branch block)   . Low back pain    chronic, seeing pain management  . 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 OSA (obstructive sleep apnea)   . Pure hypercholesterolemia   .  Scrotal pain 10/23/2014  . Shortness of breath dyspnea   . 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 Traumatic open wound of left lower leg with delayed healing    Seen at [facility] on 05/29/2015  . Unspecified hemorrhoids without mention of complication   . Wide-complex tachycardia ([place]) 01/2011   Felt likely be SVT with abbarancy    Past Surgical History:  Procedure Laterality Date  . BACK SURGERY  2012   Lumbar, [name]  . BONE MARROW  TRANSPLANT  june 2011  . CARDIOVERSION N/A 07/10/2013   Procedure: CARDIOVERSION;  Surgeon: Lelon Perla, MD;  Location: [facility] ENDOSCOPY;  Service: Cardiovascular;  Laterality: N/A;  . CARDIOVERSION N/A 02/06/2016   Procedure: CARDIOVERSION;  Surgeon: Skeet Latch, MD;  Location: [place];  Service: Cardiovascular;  Laterality: N/A;  . CARPAL TUNNEL RELEASE  08/2006   Dr Doy Mince at [place]    . IR FLUORO GUIDE PORT INSERTION RIGHT  06/13/2017  . IR US GUIDE VASC ACCESS RIGHT  06/13/2017  . KNEE ARTHROSCOPY    . Lumbar Medial Branch Block  2017   left L2-5, S1 and right L2-5 and S1 medial branches    Social History   Social History  . Marital status: Married    Spouse name: N/A  . Number of children: 2  . Years of education: N/A   Occupational History  . PRESIDENT [employer]   Social History Main Topics  . Smoking status: Never Smoker  . Smokeless tobacco: Never Used  . Alcohol use 0.0 oz/week     Comment: 06/24/2017 "couple mixed drinks/day usually; nothing in the last several months"  . Drug use: No  . Sexual activity: No   Other Topics Concern  . Not on file   Social History Narrative   Lives with wife.      Family History  Problem Relation Age of Onset  . Lung cancer Mother   . Skin cancer Father        Melanoma  . Colon cancer Neg Hx   . Colon polyps Neg Hx   . Kidney disease Neg Hx   . Esophageal cancer Neg Hx   . Diabetes Neg Hx   . Gallbladder disease Neg Hx     ROS: no fevers or chills, productive cough, hemoptysis, dysphasia, odynophagia, melena, hematochezia, dysuria, hematuria, rash, seizure activity, orthopnea, PND, pedal edema, claudication. Remaining systems are negative.  Physical Exam: Well-developed chronically ill appearing in no acute distress.  Skin is warm and dry.  HEENT is normal.  Neck is supple.  Chest is clear to auscultation with normal expansion.    Cardiovascular exam is irregular Abdominal exam nontender or distended. No masses palpated. Extremities show 2+ edema. neuro grossly intact  ECG- 06/24/2017-atrial fibrillation, left bundle branch block. personally reviewed Electrocardiogram today shows atrial fibrillation at a rate of 106 and left bundle branch block.   A/P  1 paroxysmal atrial fibrillation-patient has developed recurrent atrial fibrillation. He is extremely symptomatic with worsening dyspnea on exertion and pedal edema. His anticoagulation was discontinued previously because of falls. This continues to be a problem but he does not tolerate atrial fibrillation and I feel that we have no other choice other than to attempt and reestablish sinus rhythm. He is in agreement. I also discussed this with Dr. Alvy Bimler. He has significant anemia but this is felt likely related to a combination of  his amyloidosis, chronic disease and renal insufficiency. He is not actively bleeding. He is receiving chemotherapy for his amyloid but platelet count has been greater than 80,000. We will begin apixaban 5 mg BID. I have explained the risk of anticoagulation including bleeding. This is particularly true if he falls. We will then arrange a TEE guided cardioversion. I will continue anticoagulation for 4 weeks after and then discontinue. Hopefully this will improve his heart failure symptoms. I will increase his amiodarone to 200 mg twice a day until his cardioversion and then resume 200 mg daily. I believe the patient is deteriorating. I explained the severity of his illness today and opened discussions concerning CODE STATUS. He would like to be full code for now.  2 cardiomyopathy-LV function is mildly reduced. We have not given additional beta-blockade because of history of first-degree AV block and bradycardia. No ACE inhibitor given renal insufficiency.  3 chronic diastolic congestive heart failure- patient is markedly volume overloaded likely  exacerbated by atrial fibrillation. He was felt to be dehydrated and orthostatic recently. I will increase Demadex to 30 mg daily and continue spironolactone 25 mg daily. Check potassium and renal function in 1 week. I recommended admission but the patient preferred outpatient therapy.  4 hypertension-blood pressure is controlled.  5 hyperlipidemia-continue statin.  6 SVT-continue amiodarone.  7 Coronary artery disease-continue statin.  8 amyloidosis-management per oncology.  he is receiving chemotherapy. He has had a previous transplant. He is requiring recurrent transfusions.   9 chronic stage III kidney disease-Recheck renal function in 1 week. Difficult situation. I am trying to balance diuretic dose for maintaining euvolemic versus worsening his renal function.  Patient's prognosis appears to be poor. I would like to keep him comfortable in terms of CHF and I believe reestablishing sinus rhythm would be beneficial.  [name], MD

## 2017-06-17 ENCOUNTER — Other Ambulatory Visit: Payer: Self-pay | Admitting: Hematology and Oncology

## 2017-06-17 DIAGNOSIS — E538 Deficiency of other specified B group vitamins: Secondary | ICD-10-CM | POA: Insufficient documentation

## 2017-06-21 ENCOUNTER — Ambulatory Visit (HOSPITAL_BASED_OUTPATIENT_CLINIC_OR_DEPARTMENT_OTHER): Payer: Medicare Other | Admitting: Hematology and Oncology

## 2017-06-21 ENCOUNTER — Ambulatory Visit (HOSPITAL_COMMUNITY)
Admission: RE | Admit: 2017-06-21 | Discharge: 2017-06-21 | Disposition: A | Discharge status: Home / Self Care | Payer: Medicare Other | Source: Ambulatory Visit | Attending: Hematology and Oncology | Admitting: Hematology and Oncology

## 2017-06-21 ENCOUNTER — Other Ambulatory Visit (HOSPITAL_BASED_OUTPATIENT_CLINIC_OR_DEPARTMENT_OTHER): Payer: Medicare Other

## 2017-06-21 ENCOUNTER — Encounter: Payer: Self-pay | Admitting: Hematology and Oncology

## 2017-06-21 ENCOUNTER — Ambulatory Visit: Payer: Medicare Other

## 2017-06-21 ENCOUNTER — Telehealth: Payer: Self-pay | Admitting: Hematology and Oncology

## 2017-06-21 ENCOUNTER — Ambulatory Visit (HOSPITAL_BASED_OUTPATIENT_CLINIC_OR_DEPARTMENT_OTHER): Payer: Medicare Other

## 2017-06-21 VITALS — BP 111/59 | HR 94 | Temp 97.9°F | Resp 18

## 2017-06-21 VITALS — BP 105/59 | HR 89 | Temp 98.6°F | Resp 18 | Ht 71.0 in | Wt 251.6 lb

## 2017-06-21 DIAGNOSIS — I5032 Chronic diastolic (congestive) heart failure: Secondary | ICD-10-CM | POA: Diagnosis not present

## 2017-06-21 DIAGNOSIS — N183 Chronic kidney disease, stage 3 unspecified: Secondary | ICD-10-CM

## 2017-06-21 DIAGNOSIS — E859 Amyloidosis, unspecified: Secondary | ICD-10-CM

## 2017-06-21 DIAGNOSIS — D638 Anemia in other chronic diseases classified elsewhere: Secondary | ICD-10-CM | POA: Diagnosis not present

## 2017-06-21 DIAGNOSIS — D630 Anemia in neoplastic disease: Secondary | ICD-10-CM

## 2017-06-21 DIAGNOSIS — D649 Anemia, unspecified: Secondary | ICD-10-CM | POA: Insufficient documentation

## 2017-06-21 DIAGNOSIS — Z9181 History of falling: Secondary | ICD-10-CM

## 2017-06-21 DIAGNOSIS — E538 Deficiency of other specified B group vitamins: Secondary | ICD-10-CM

## 2017-06-21 DIAGNOSIS — T148XXD Other injury of unspecified body region, subsequent encounter: Secondary | ICD-10-CM

## 2017-06-21 DIAGNOSIS — W19XXXA Unspecified fall, initial encounter: Secondary | ICD-10-CM

## 2017-06-21 LAB — CBC WITH DIFFERENTIAL/PLATELET
BASO%: 0.5 % (ref 0.0–2.0)
Basophils Absolute: 0 10*3/uL (ref 0.0–0.1)
EOS%: 3.6 % (ref 0.0–7.0)
Eosinophils Absolute: 0.1 10*3/uL (ref 0.0–0.5)
HEMATOCRIT: 24.7 % — AB (ref 38.4–49.9)
HEMOGLOBIN: 7.6 g/dL — AB (ref 13.0–17.1)
LYMPH#: 0.2 10*3/uL — AB (ref 0.9–3.3)
LYMPH%: 11.3 % — ABNORMAL LOW (ref 14.0–49.0)
MCH: 24.1 pg — ABNORMAL LOW (ref 27.2–33.4)
MCHC: 30.8 g/dL — AB (ref 32.0–36.0)
MCV: 78.2 fL — ABNORMAL LOW (ref 79.3–98.0)
MONO#: 0.8 10*3/uL (ref 0.1–0.9)
MONO%: 41.8 % — ABNORMAL HIGH (ref 0.0–14.0)
NEUT%: 42.8 % (ref 39.0–75.0)
NEUTROS ABS: 0.8 10*3/uL — AB (ref 1.5–6.5)
Platelets: 47 10*3/uL — ABNORMAL LOW (ref 140–400)
RBC: 3.16 10*6/uL — ABNORMAL LOW (ref 4.20–5.82)
RDW: 29 % — ABNORMAL HIGH (ref 11.0–14.6)
WBC: 1.9 10*3/uL — AB (ref 4.0–10.3)
nRBC: 4 % — ABNORMAL HIGH (ref 0–0)

## 2017-06-21 LAB — COMPREHENSIVE METABOLIC PANEL
ALBUMIN: 3.1 g/dL — AB (ref 3.5–5.0)
ALT: 50 U/L (ref 0–55)
AST: 16 U/L (ref 5–34)
Alkaline Phosphatase: 95 U/L (ref 40–150)
Anion Gap: 9 mEq/L (ref 3–11)
BUN: 42.2 mg/dL — AB (ref 7.0–26.0)
CALCIUM: 8.4 mg/dL (ref 8.4–10.4)
CHLORIDE: 106 meq/L (ref 98–109)
CO2: 26 mEq/L (ref 22–29)
CREATININE: 1.7 mg/dL — AB (ref 0.7–1.3)
EGFR: 39 mL/min/{1.73_m2} — ABNORMAL LOW (ref >90)
GLUCOSE: 108 mg/dL (ref 70–140)
POTASSIUM: 4.1 meq/L (ref 3.5–5.1)
SODIUM: 141 meq/L (ref 136–145)
Total Bilirubin: 1.17 mg/dL (ref 0.20–1.20)
Total Protein: 5.8 g/dL — ABNORMAL LOW (ref 6.4–8.3)

## 2017-06-21 LAB — PREPARE RBC (CROSSMATCH)

## 2017-06-21 LAB — TECHNOLOGIST REVIEW

## 2017-06-21 MED ORDER — CYANOCOBALAMIN 1000 MCG/ML IJ SOLN
1000.0000 ug | INTRAMUSCULAR | Status: DC
  Administered 2017-06-21: 1000 ug via INTRAMUSCULAR

## 2017-06-21 MED ORDER — CYANOCOBALAMIN 1000 MCG/ML IJ SOLN
INTRAMUSCULAR | Status: AC
  Filled 2017-06-21: qty 1

## 2017-06-21 MED ORDER — FUROSEMIDE 10 MG/ML IJ SOLN: 20.0000 mg | Freq: Once | INTRAMUSCULAR | Status: DC

## 2017-06-21 MED ORDER — SODIUM CHLORIDE 0.9% FLUSH
10.0000 mL | Freq: Once | INTRAVENOUS | Status: AC
  Administered 2017-06-21: 10 mL
  Filled 2017-06-21: qty 10

## 2017-06-21 MED ORDER — DIPHENHYDRAMINE HCL 25 MG PO CAPS
25.0000 mg | ORAL_CAPSULE | Freq: Once | ORAL | Status: AC
  Administered 2017-06-21: 25 mg via ORAL

## 2017-06-21 MED ORDER — FUROSEMIDE 10 MG/ML IJ SOLN
INTRAMUSCULAR | Status: AC
  Filled 2017-06-21: qty 2

## 2017-06-21 MED ORDER — HEPARIN SOD (PORK) LOCK FLUSH 100 UNIT/ML IV SOLN
500.0000 [IU] | Freq: Once | INTRAVENOUS | Status: AC
  Administered 2017-06-21: 500 [IU]
  Filled 2017-06-21: qty 5

## 2017-06-21 MED ORDER — ACETAMINOPHEN 325 MG PO TABS
ORAL_TABLET | ORAL | Status: AC
  Filled 2017-06-21: qty 2

## 2017-06-21 MED ORDER — SODIUM CHLORIDE 0.9 % IV SOLN
250.0000 mL | Freq: Once | INTRAVENOUS | Status: AC
  Administered 2017-06-21: 250 mL via INTRAVENOUS

## 2017-06-21 MED ORDER — ACETAMINOPHEN 325 MG PO TABS
650.0000 mg | ORAL_TABLET | Freq: Once | ORAL | Status: AC
  Administered 2017-06-21: 650 mg via ORAL

## 2017-06-21 MED ORDER — DIPHENHYDRAMINE HCL 25 MG PO CAPS
ORAL_CAPSULE | ORAL | Status: AC
  Filled 2017-06-21: qty 1

## 2017-06-21 NOTE — Progress Notes (Signed)
Per Dr Alvy Bimler due to drop in BP no lasix to be given between units of blood. Ok to infuse both units of blood today. Will continue to monitor pt.

## 2017-06-21 NOTE — Telephone Encounter (Signed)
Gave patient avs report and appointments for July.  °

## 2017-06-21 NOTE — Patient Instructions (Signed)

## 2017-06-21 NOTE — Patient Instructions (Addendum)
Keep Taking Spironolactone (Aldactone)    Blood Transfusion, Adult A blood transfusion is a procedure in which you receive donated blood, including plasma, platelets, and red blood cells, through an IV tube. You may need a blood transfusion because of illness, surgery, or injury. The blood may come from a donor. You may also be able to donate blood for yourself (autologous blood donation) before a surgery if you know that you might require a blood transfusion. The blood given in a transfusion is made up of different types of cells. You may receive:  Red blood cells. These carry oxygen to the cells in the body.  White blood cells. These help you fight infections.  Platelets. These help your blood to clot.  Plasma. This is the liquid part of your blood and it helps with fluid imbalances.  If you have hemophilia or another clotting disorder, you may also receive other types of blood products. Tell a health care provider about:  Any allergies you have.  All medicines you are taking, including vitamins, herbs, eye drops, creams, and over-the-counter medicines.  Any problems you or family members have had with anesthetic medicines.  Any blood disorders you have.  Any surgeries you have had.  Any medical conditions you have, including any recent fever or cold symptoms.  Whether you are pregnant or may be pregnant.  Any previous reactions you have had during a blood transfusion. What are the risks? Generally, this is a safe procedure. However, problems may occur, including:  Having an allergic reaction to something in the donated blood. Hives and itching may be symptoms of this type of reaction.  Fever. This may be a reaction to the white blood cells in the transfused blood. Nausea or chest pain may accompany a fever.  Iron overload. This can happen from having many transfusions.  Transfusion-related acute lung injury (TRALI). This is a rare reaction that causes lung damage.  The cause is not known.TRALI can occur within hours of a transfusion or several days later.  Sudden (acute) or delayed hemolytic reactions. This happens if your blood does not match the cells in your transfusion. Your body's defense system (immune system) may try to attack the new cells. This complication is rare. The symptoms include fever, chills, nausea, and low back pain or chest pain.  Infection or disease transmission. This is rare.  What happens before the procedure?  You will have a blood test to determine your blood type. This is necessary to know what kind of blood your body will accept and to match it to the donor blood.  If you are going to have a planned surgery, you may be able to do an autologous blood donation. This may be done in case you need to have a transfusion.  If you have had an allergic reaction to a transfusion in the past, you may be given medicine to help prevent a reaction. This medicine may be given to you by mouth or through an IV tube.  You will have your temperature, blood pressure, and pulse monitored before the transfusion.  Follow instructions from your health care provider about eating and drinking restrictions.  Ask your health care provider about: ? Changing or stopping your regular medicines. This is especially important if you are taking diabetes medicines or blood thinners. ? Taking medicines such as aspirin and ibuprofen. These medicines can thin your blood. Do not take these medicines before your procedure if your health care provider instructs you not to. What  happens during the procedure?  An IV tube will be inserted into one of your veins.  The bag of donated blood will be attached to your IV tube. The blood will then enter through your vein.  Your temperature, blood pressure, and pulse will be monitored regularly during the transfusion. This monitoring is done to detect early signs of a transfusion reaction.  If you have any signs or  symptoms of a reaction, your transfusion will be stopped and you may be given medicine.  When the transfusion is complete, your IV tube will be removed.  Pressure may be applied to the IV site for a few minutes.  A bandage (dressing) will be applied. The procedure may vary among health care providers and hospitals. What happens after the procedure?  Your temperature, blood pressure, heart rate, breathing rate, and blood oxygen level will be monitored often.  Your blood may be tested to see how you are responding to the transfusion.  You may be warmed with fluids or blankets to maintain a normal body temperature. Summary  A blood transfusion is a procedure in which you receive donated blood, including plasma, platelets, and red blood cells, through an IV tube.  Your temperature, blood pressure, and pulse will be monitored before, during, and after the transfusion.  Your blood may be tested after the transfusion to see how your body has responded. This information is not intended to replace advice given to you by your health care provider. Make sure you discuss any questions you have with your health care provider. Document Released: 11/26/2000 Document Revised: 08/26/2016 Document Reviewed: 08/26/2016 Elsevier Interactive Patient Education  [employer].

## 2017-06-22 ENCOUNTER — Telehealth: Payer: Self-pay | Admitting: Internal Medicine

## 2017-06-22 LAB — BPAM RBC
BLOOD PRODUCT EXPIRATION DATE: 201807282359
Blood Product Expiration Date: 201807282359
ISSUE DATE / TIME: 201807101206
ISSUE DATE / TIME: 201807101206
UNIT TYPE AND RH: 5100
Unit Type and Rh: 5100

## 2017-06-22 LAB — TYPE AND SCREEN
ABO/RH(D): O POS
Antibody Screen: NEGATIVE
Unit division: 0
Unit division: 0

## 2017-06-22 NOTE — Telephone Encounter (Signed)
Patient would like to transfer from Dr. Larose Kells to Dr. Ronnald Ramp, please advise

## 2017-06-22 NOTE — Telephone Encounter (Signed)
Ok w/ me 

## 2017-06-23 ENCOUNTER — Encounter: Payer: Self-pay | Admitting: Hematology and Oncology

## 2017-06-23 NOTE — Assessment & Plan Note (Signed)
He has poor wound healing We will continue close monitoring Clinically,  the wound does not appear infected

## 2017-06-23 NOTE — Assessment & Plan Note (Signed)
He had recent acute on chronic renal failure  He will continue close blood, monitoring during treatment No dose adjustment is needed for Velcade

## 2017-06-23 NOTE — Assessment & Plan Note (Addendum)
He tolerated treatment well but he is dealing with congestive heart failure and renal failure We will continue weekly dexamethasone along with Velcade Repeat serum light chain measurement showed improvement after 4 doses of chemotherapy We will continue aggressive supportive care

## 2017-06-23 NOTE — Progress Notes (Signed)
[facility] OFFICE PROGRESS NOTE  Patient Care Team: Colon Branch, MD as PCP - General (Internal Medicine) [name] Denice Bors, MD (Cardiology) Heath Lark, MD as Consulting Physician (Hematology and Oncology) Myrle Sheng, MD as Referring Physician (Neurosurgery) Gillis Santa, MD as Referring Physician (Pain Medicine)  SUMMARY OF ONCOLOGIC HISTORY:  He was diagnosed with systemic amyloidosis after presentation with shortness of breath. He has significant evaluation including bone marrow aspirate and biopsy. 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 The patient subsequently went for maintain dense chemotherapy with Velcade for almost 2 years and then switch to Revlimid. He self discontinue Revlimid in May of 2014 due to fatigue. In July 2015, we restarted him back on Velcade due to a rising light chain. In September 2015, the patient requested to add back dexamethasone at 20 mg every week. In November 2015, the patient reduce dexamethasone to 16 mg every week along with Velcade injection. We discontinued treatment on 11/20/2014 On 05/03/2017, he resumed treatment with Velcade and dexamethasone  INTERVAL HISTORY: Please see below for problem oriented charting. He returns for further follow-up and chemotherapy. He had recent port placement due to poor venous access He is accompanied by his wife He complained of dizziness and weakness He had recent fall around 06/20/2017 with mild injury According to him, he rolled off the bed He has lost almost 7 pounds of fluid weight He saw his cardiologist recently and medications has been adjusted He has slow, nonhealing wound on both lower extremities after recent fall Denies recent fever or chills He denies  peripheral neuropathy from treatment   REVIEW OF SYSTEMS:   Constitutional: Denies fevers, chills or abnormal weight loss Eyes: Denies blurriness of vision Ears, nose, mouth, throat, and face: Denies mucositis or sore throat Respiratory: Denies cough, dyspnea or wheezes Cardiovascular: Denies palpitation, chest discomfort or lower extremity swelling Gastrointestinal:  Denies nausea, heartburn or change in bowel habits Skin: Denies abnormal skin rashes Lymphatics: Denies new lymphadenopathy or easy bruising Neurological:Denies numbness, tingling or new weaknesses Behavioral/Psych: Mood is stable, no new changes  All other systems were reviewed with the patient and are negative.  I have reviewed the past medical history, past surgical history, social history and family history with the patient and they are unchanged from previous note.  ALLERGIES:  has No Known Allergies.  MEDICATIONS:  Current Outpatient Prescriptions  Medication Sig Dispense Refill  . acyclovir (ZOVIRAX) 400 MG tablet Take 1 tablet (400 mg total) by mouth daily. 30 tablet 3  . amiodarone (PACERONE) 200 MG tablet Take 1 tablet (200 mg total) by mouth daily. 90 tablet 3  . dexamethasone (DECADRON) 4 MG tablet Take 1 tablet (4 mg total) by mouth daily. 30 tablet 1  . ondansetron (ZOFRAN) 8 MG tablet Take 1 tablet (8 mg total) by mouth 2 (two) times daily as needed (Nausea or vomiting). 30 tablet 1  . pravastatin (PRAVACHOL) 40 MG tablet Take 1 tablet (40 mg total) by mouth every evening. 90 tablet 3  . prochlorperazine (COMPAZINE) 10 MG tablet Take 1 tablet (10 mg total) by mouth every 6 (six) hours as needed (Nausea or vomiting). 30 tablet 1  . spironolactone (ALDACTONE) 25 MG tablet Take 1 tablet (25 mg total) by mouth daily. 30 tablet 0  . torsemide (DEMADEX) 20 MG tablet Take  1 tablet (20 mg total) by mouth 2 (two) times daily. 180 tablet 3   No current facility-administered medications for this visit.     Facility-Administered Medications Ordered in Other Visits  Medication Dose Route Frequency Provider Last Rate Last Dose  . iopamidol (ISOVUE-M) 41 % intrathecal injection 16 mL  16 mL Intrathecal Once Kristeen Miss, MD        PHYSICAL EXAMINATION: ECOG PERFORMANCE STATUS: 2 - Symptomatic, <50% confined to bed  Vitals:   06/21/17 1015  BP: (!) 105/59  Pulse: 89  Resp: 18  Temp: 98.6 F (37 C)   Filed Weights   06/21/17 1015  Weight: 251 lb 9.6 oz (114.1 kg)    GENERAL:alert, no distress and comfortable. He has extensive bruises SKIN: skin color, texture, turgor are normal, no rashes or significant lesions. Noted persistent open wounds, improved EYES: normal, Conjunctiva are pink and non-injected, sclera clear OROPHARYNX:no exudate, no erythema and lips, buccal mucosa, and tongue normal  NECK: supple, thyroid normal size, non-tender, without nodularity LYMPH:  no palpable lymphadenopathy in the cervical, axillary or inguinal LUNGS: clear to auscultation and percussion with normal breathing effort HEART: regular rate & rhythm and no murmurs with moderate lower extremity edema ABDOMEN:abdomen soft, non-tender and normal bowel sounds Musculoskeletal:no cyanosis of digits and no clubbing  NEURO: alert & oriented x 3 with fluent speech, no focal motor/sensory deficits  LABORATORY DATA:  I have reviewed the data as listed    Component Value Date/Time   NA 141 06/21/2017 0901   K 4.1 06/21/2017 0901   CL 108 06/13/2017 1152   CL 103 05/31/2013 0842   CO2 26 06/21/2017 0901   GLUCOSE 108 06/21/2017 0901   GLUCOSE 118 (H) 05/31/2013 0842   BUN 42.2 (H) 06/21/2017 0901   CREATININE 1.7 (H) 06/21/2017 0901   CALCIUM 8.4 06/21/2017 0901   PROT 5.8 (L) 06/21/2017 0901   ALBUMIN 3.1 (L) 06/21/2017 0901   AST 16 06/21/2017 0901   ALT 50 06/21/2017 0901   ALKPHOS 95 06/21/2017 0901   BILITOT 1.17 06/21/2017 0901   GFRNONAA 37 (L) 06/13/2017 1152   GFRNONAA 63 06/10/2014 1221    GFRAA 43 (L) 06/13/2017 1152   GFRAA 73 06/10/2014 1221    No results found for: SPEP, UPEP  Lab Results  Component Value Date   WBC 1.9 (L) 06/21/2017   NEUTROABS 0.8 (L) 06/21/2017   HGB 7.6 (L) 06/21/2017   HCT 24.7 (L) 06/21/2017   MCV 78.2 (L) 06/21/2017   PLT 47 Giant platelets present (L) 06/21/2017      Chemistry      Component Value Date/Time   NA 141 06/21/2017 0901   K 4.1 06/21/2017 0901   CL 108 06/13/2017 1152   CL 103 05/31/2013 0842   CO2 26 06/21/2017 0901   BUN 42.2 (H) 06/21/2017 0901   CREATININE 1.7 (H) 06/21/2017 0901      Component Value Date/Time   CALCIUM 8.4 06/21/2017 0901   ALKPHOS 95 06/21/2017 0901   AST 16 06/21/2017 0901   ALT 50 06/21/2017 0901   BILITOT 1.17 06/21/2017 0901         RADIOGRAPHIC STUDIES: I have personally reviewed the radiological images as listed and agreed with the findings in the report. Ir US Guide Vasc Access Right  Result Date: 06/13/2017 CLINICAL DATA:  History of amyloidosis and poor intravenous access. Referral for Port-A-Cath placement. EXAM: IMPLANTED PORT A CATH PLACEMENT WITH ULTRASOUND AND FLUOROSCOPIC GUIDANCE ANESTHESIA/SEDATION: 2.0 mg IV  Versed; 100 mcg IV Fentanyl Total Moderate Sedation Time:  42 minutes. The patient's level of consciousness and physiologic status were continuously monitored during the procedure by Radiology nursing. Additional Medications: 2 g IV Ancef. As antibiotic prophylaxis, Ancef was ordered pre-procedure and administered intravenously within one hour of incision. FLUOROSCOPY TIME:  30 seconds.  16.0 mGy. PROCEDURE: The procedure, risks, benefits, and alternatives were explained to the patient. Questions regarding the procedure were encouraged and answered. The patient understands and consents to the procedure. A time-out was performed prior to initiating the procedure. Ultrasound was utilized to confirm patency of the right internal jugular vein. The right neck and chest were  prepped with chlorhexidine in a sterile fashion, and a sterile drape was applied covering the operative field. Maximum barrier sterile technique with sterile gowns and gloves were used for the procedure. Local anesthesia was provided with 1% lidocaine. After creating a small venotomy incision, a 21 gauge needle was advanced into the right internal jugular vein under direct, real-time ultrasound guidance. Ultrasound image documentation was performed. After securing guidewire access, an 8 Fr dilator was placed. A J-wire was kinked to measure appropriate catheter length. A subcutaneous port pocket was then created along the upper chest wall utilizing sharp and blunt dissection. Portable cautery was utilized. The pocket was irrigated with sterile saline. A single lumen power injectable port was chosen for placement. The 8 Fr catheter was tunneled from the port pocket site to the venotomy incision. The port was placed in the pocket. External catheter was trimmed to appropriate length based on guidewire measurement. At the venotomy, an 8 Fr peel-away sheath was placed over a guidewire. The catheter was then placed through the sheath and the sheath removed. Final catheter positioning was confirmed and documented with a fluoroscopic spot image. The port was accessed with a needle and aspirated and flushed with heparinized saline. The access needle was removed. The venotomy and port pocket incisions were closed with subcutaneous 3-0 Monocryl and subcuticular 4-0 Vicryl. Dermabond was applied to both incisions. COMPLICATIONS: COMPLICATIONS None FINDINGS: After catheter placement, the tip lies at the cavo-atrial junction. The catheter aspirates normally and is ready for immediate use. IMPRESSION: Placement of single lumen port a cath via right internal jugular vein. The catheter tip lies at the cavo-atrial junction. A power injectable port a cath was placed and is ready for immediate use. Electronically Signed   By: Aletta Edouard M.D.   On: 06/13/2017 17:15   Ir Fluoro Guide Port Insertion Right  Result Date: 06/13/2017 CLINICAL DATA:  History of amyloidosis and poor intravenous access. Referral for Port-A-Cath placement. EXAM: IMPLANTED PORT A CATH PLACEMENT WITH ULTRASOUND AND FLUOROSCOPIC GUIDANCE ANESTHESIA/SEDATION: 2.0 mg IV Versed; 100 mcg IV Fentanyl Total Moderate Sedation Time:  42 minutes. The patient's level of consciousness and physiologic status were continuously monitored during the procedure by Radiology nursing. Additional Medications: 2 g IV Ancef. As antibiotic prophylaxis, Ancef was ordered pre-procedure and administered intravenously within one hour of incision. FLUOROSCOPY TIME:  30 seconds.  16.0 mGy. PROCEDURE: The procedure, risks, benefits, and alternatives were explained to the patient. Questions regarding the procedure were encouraged and answered. The patient understands and consents to the procedure. A time-out was performed prior to initiating the procedure. Ultrasound was utilized to confirm patency of the right internal jugular vein. The right neck and chest were prepped with chlorhexidine in a sterile fashion, and a sterile drape was applied covering the operative field. Maximum barrier sterile technique with sterile gowns  and gloves were used for the procedure. Local anesthesia was provided with 1% lidocaine. After creating a small venotomy incision, a 21 gauge needle was advanced into the right internal jugular vein under direct, real-time ultrasound guidance. Ultrasound image documentation was performed. After securing guidewire access, an 8 Fr dilator was placed. A J-wire was kinked to measure appropriate catheter length. A subcutaneous port pocket was then created along the upper chest wall utilizing sharp and blunt dissection. Portable cautery was utilized. The pocket was irrigated with sterile saline. A single lumen power injectable port was chosen for placement. The 8 Fr catheter was  tunneled from the port pocket site to the venotomy incision. The port was placed in the pocket. External catheter was trimmed to appropriate length based on guidewire measurement. At the venotomy, an 8 Fr peel-away sheath was placed over a guidewire. The catheter was then placed through the sheath and the sheath removed. Final catheter positioning was confirmed and documented with a fluoroscopic spot image. The port was accessed with a needle and aspirated and flushed with heparinized saline. The access needle was removed. The venotomy and port pocket incisions were closed with subcutaneous 3-0 Monocryl and subcuticular 4-0 Vicryl. Dermabond was applied to both incisions. COMPLICATIONS: COMPLICATIONS None FINDINGS: After catheter placement, the tip lies at the cavo-atrial junction. The catheter aspirates normally and is ready for immediate use. IMPRESSION: Placement of single lumen port a cath via right internal jugular vein. The catheter tip lies at the cavo-atrial junction. A power injectable port a cath was placed and is ready for immediate use. Electronically Signed   By: Aletta Edouard M.D.   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 The patient is symptomatic from anemia and the hemoglobin level is critically low.  Some of the side-effects to be expected including risks of transfusion reactions, chills, infection, syndrome of volume overload and risk of hospitalization from various reasons and the patient is willing to proceed and went ahead to sign consent today. I plan to give him 2 units of blood with diuresis after each unit of blood  Vitamin B12 deficiency He is found to have profound vitamin B12 deficiency He will receive weekly B12 injection and then monthly  Chronic diastolic heart failure ([place]) He will continue close monitoringe in addition to Lasix He saw his cardiologist recently and his diuretic therapy has been modified His kidney function is stable and he has lost some fluid weight  Fall The patient had recent history of recurrent falls We discussed strategies for fall prevention  Wound healing, delayed He has poor wound healing We will continue close monitoring Clinically,  the wound does not appear infected   No orders of the defined types were placed in this encounter.  All questions were answered. The patient knows to call the clinic  with any problems, questions or concerns. No barriers to learning was detected. I spent 30 minutes counseling the patient face to face. The total time spent in the appointment was 40 minutes and more than 50% was on counseling and review of test results     Heath Lark, MD 06/23/2017 8:16 PM

## 2017-06-23 NOTE — Assessment & Plan Note (Signed)
The patient had recent history of recurrent falls We discussed strategies for fall prevention

## 2017-06-23 NOTE — Assessment & Plan Note (Signed)
He will continue close monitoringe in addition to Lasix He saw his cardiologist recently and his diuretic therapy has been modified His kidney function is stable and he has lost some fluid weight

## 2017-06-23 NOTE — Assessment & Plan Note (Signed)
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 The patient is symptomatic from anemia and the hemoglobin level is critically low.  Some of the side-effects to be expected including risks of transfusion reactions, chills, infection, syndrome of volume overload and risk of hospitalization from various reasons and the patient is willing to proceed and went ahead to sign consent today. I plan to give him 2 units of blood with diuresis after each unit of blood

## 2017-06-23 NOTE — Assessment & Plan Note (Signed)
He is found to have profound vitamin B12 deficiency He will receive weekly B12 injection and then monthly

## 2017-06-24 ENCOUNTER — Observation Stay (HOSPITAL_COMMUNITY)
Admission: EM | Admit: 2017-06-24 | Discharge: 2017-06-26 | Disposition: A | Discharge status: Home / Self Care | Payer: Medicare Other | Attending: Internal Medicine | Admitting: Internal Medicine

## 2017-06-24 ENCOUNTER — Encounter (HOSPITAL_COMMUNITY): Payer: Self-pay | Admitting: Emergency Medicine

## 2017-06-24 ENCOUNTER — Emergency Department (HOSPITAL_COMMUNITY): Payer: Medicare Other

## 2017-06-24 ENCOUNTER — Telehealth: Payer: Self-pay | Admitting: *Deleted

## 2017-06-24 ENCOUNTER — Other Ambulatory Visit: Payer: Self-pay

## 2017-06-24 DIAGNOSIS — D696 Thrombocytopenia, unspecified: Secondary | ICD-10-CM | POA: Diagnosis not present

## 2017-06-24 DIAGNOSIS — I5032 Chronic diastolic (congestive) heart failure: Secondary | ICD-10-CM | POA: Diagnosis not present

## 2017-06-24 DIAGNOSIS — I2510 Atherosclerotic heart disease of native coronary artery without angina pectoris: Secondary | ICD-10-CM | POA: Diagnosis not present

## 2017-06-24 DIAGNOSIS — R42 Dizziness and giddiness: Secondary | ICD-10-CM

## 2017-06-24 DIAGNOSIS — E7800 Pure hypercholesterolemia, unspecified: Secondary | ICD-10-CM | POA: Diagnosis not present

## 2017-06-24 DIAGNOSIS — R404 Transient alteration of awareness: Secondary | ICD-10-CM | POA: Diagnosis not present

## 2017-06-24 DIAGNOSIS — Z79899 Other long term (current) drug therapy: Secondary | ICD-10-CM | POA: Insufficient documentation

## 2017-06-24 DIAGNOSIS — I951 Orthostatic hypotension: Secondary | ICD-10-CM | POA: Diagnosis not present

## 2017-06-24 DIAGNOSIS — E538 Deficiency of other specified B group vitamins: Secondary | ICD-10-CM | POA: Diagnosis not present

## 2017-06-24 DIAGNOSIS — N183 Chronic kidney disease, stage 3 (moderate): Secondary | ICD-10-CM | POA: Insufficient documentation

## 2017-06-24 DIAGNOSIS — E860 Dehydration: Secondary | ICD-10-CM | POA: Diagnosis not present

## 2017-06-24 DIAGNOSIS — Z8673 Personal history of transient ischemic attack (TIA), and cerebral infarction without residual deficits: Secondary | ICD-10-CM | POA: Diagnosis not present

## 2017-06-24 DIAGNOSIS — E859 Amyloidosis, unspecified: Secondary | ICD-10-CM | POA: Diagnosis present

## 2017-06-24 DIAGNOSIS — I480 Paroxysmal atrial fibrillation: Secondary | ICD-10-CM | POA: Insufficient documentation

## 2017-06-24 DIAGNOSIS — R55 Syncope and collapse: Secondary | ICD-10-CM | POA: Diagnosis not present

## 2017-06-24 DIAGNOSIS — I4891 Unspecified atrial fibrillation: Secondary | ICD-10-CM | POA: Diagnosis present

## 2017-06-24 DIAGNOSIS — I872 Venous insufficiency (chronic) (peripheral): Secondary | ICD-10-CM | POA: Insufficient documentation

## 2017-06-24 DIAGNOSIS — I447 Left bundle-branch block, unspecified: Secondary | ICD-10-CM | POA: Insufficient documentation

## 2017-06-24 DIAGNOSIS — G4733 Obstructive sleep apnea (adult) (pediatric): Secondary | ICD-10-CM | POA: Diagnosis present

## 2017-06-24 DIAGNOSIS — I10 Essential (primary) hypertension: Secondary | ICD-10-CM | POA: Diagnosis not present

## 2017-06-24 DIAGNOSIS — I130 Hypertensive heart and chronic kidney disease with heart failure and stage 1 through stage 4 chronic kidney disease, or unspecified chronic kidney disease: Secondary | ICD-10-CM | POA: Insufficient documentation

## 2017-06-24 DIAGNOSIS — M5136 Other intervertebral disc degeneration, lumbar region: Secondary | ICD-10-CM | POA: Diagnosis not present

## 2017-06-24 DIAGNOSIS — D72819 Decreased white blood cell count, unspecified: Secondary | ICD-10-CM | POA: Diagnosis not present

## 2017-06-24 DIAGNOSIS — D649 Anemia, unspecified: Secondary | ICD-10-CM | POA: Diagnosis not present

## 2017-06-24 DIAGNOSIS — R296 Repeated falls: Secondary | ICD-10-CM | POA: Diagnosis not present

## 2017-06-24 LAB — CBC WITH DIFFERENTIAL/PLATELET
BASOS ABS: 0 10*3/uL (ref 0.0–0.1)
BLASTS: 0 %
Band Neutrophils: 0 %
Basophils Relative: 1 %
Eosinophils Absolute: 0.2 10*3/uL (ref 0.0–0.7)
Eosinophils Relative: 9 %
HEMATOCRIT: 28.5 % — AB (ref 39.0–52.0)
Hemoglobin: 9 g/dL — ABNORMAL LOW (ref 13.0–17.0)
Lymphocytes Relative: 43 %
Lymphs Abs: 1.2 10*3/uL (ref 0.7–4.0)
MCH: 24.8 pg — ABNORMAL LOW (ref 26.0–34.0)
MCHC: 31.6 g/dL (ref 30.0–36.0)
MCV: 78.5 fL (ref 78.0–100.0)
METAMYELOCYTES PCT: 0 %
MYELOCYTES: 0 %
Monocytes Absolute: 0.5 10*3/uL (ref 0.1–1.0)
Monocytes Relative: 18 %
NEUTROS PCT: 29 %
NRBC: 0 /100{WBCs}
Neutro Abs: 0.8 10*3/uL — ABNORMAL LOW (ref 1.7–7.7)
Other: 0 %
PLATELETS: 103 10*3/uL — AB (ref 150–400)
PROMYELOCYTES ABS: 0 %
RBC: 3.63 MIL/uL — AB (ref 4.22–5.81)
RDW: 27.3 % — AB (ref 11.5–15.5)
WBC: 2.7 10*3/uL — AB (ref 4.0–10.5)

## 2017-06-24 LAB — COMPREHENSIVE METABOLIC PANEL
ALK PHOS: 88 U/L (ref 38–126)
ALT: 22 U/L (ref 17–63)
AST: 16 U/L (ref 15–41)
Albumin: 3.2 g/dL — ABNORMAL LOW (ref 3.5–5.0)
Anion gap: 8 (ref 5–15)
BILIRUBIN TOTAL: 1.5 mg/dL — AB (ref 0.3–1.2)
BUN: 35 mg/dL — AB (ref 6–20)
CALCIUM: 8.1 mg/dL — AB (ref 8.9–10.3)
CHLORIDE: 100 mmol/L — AB (ref 101–111)
CO2: 26 mmol/L (ref 22–32)
CREATININE: 1.94 mg/dL — AB (ref 0.61–1.24)
GFR, EST AFRICAN AMERICAN: 36 mL/min — AB (ref >60)
GFR, EST NON AFRICAN AMERICAN: 31 mL/min — AB (ref >60)
Glucose, Bld: 98 mg/dL (ref 65–99)
Potassium: 4.1 mmol/L (ref 3.5–5.1)
Sodium: 134 mmol/L — ABNORMAL LOW (ref 135–145)
TOTAL PROTEIN: 5.5 g/dL — AB (ref 6.5–8.1)

## 2017-06-24 LAB — I-STAT TROPONIN, ED: TROPONIN I, POC: 0 ng/mL (ref 0.00–0.08)

## 2017-06-24 LAB — TROPONIN I: Troponin I: <0.03 ng/mL (ref <0.03)

## 2017-06-24 MED ORDER — FOLIC ACID 1 MG PO TABS
1.0000 mg | ORAL_TABLET | Freq: Every day | ORAL | Status: DC
  Administered 2017-06-25 – 2017-06-26 (×2): 1 mg via ORAL
  Filled 2017-06-24 (×2): qty 1

## 2017-06-24 MED ORDER — AMIODARONE HCL 200 MG PO TABS
200.0000 mg | ORAL_TABLET | Freq: Every day | ORAL | Status: DC
Start: 2017-06-25 — End: 2017-06-26
  Administered 2017-06-25 – 2017-06-26 (×2): 200 mg via ORAL
  Filled 2017-06-24 (×2): qty 1

## 2017-06-24 MED ORDER — ACYCLOVIR 400 MG PO TABS
400.0000 mg | ORAL_TABLET | Freq: Every day | ORAL | Status: DC
  Administered 2017-06-25 – 2017-06-26 (×2): 400 mg via ORAL
  Filled 2017-06-24 (×2): qty 1

## 2017-06-24 MED ORDER — PRAVASTATIN SODIUM 40 MG PO TABS
40.0000 mg | ORAL_TABLET | Freq: Every evening | ORAL | Status: DC
  Administered 2017-06-25: 40 mg via ORAL
  Filled 2017-06-24: qty 1

## 2017-06-24 MED ORDER — SPIRONOLACTONE 25 MG PO TABS
25.0000 mg | ORAL_TABLET | Freq: Every day | ORAL | Status: DC
  Administered 2017-06-25 – 2017-06-26 (×2): 25 mg via ORAL
  Filled 2017-06-24 (×2): qty 1

## 2017-06-24 MED ORDER — ONDANSETRON HCL 4 MG PO TABS: 4.0000 mg | ORAL_TABLET | Freq: Four times a day (QID) | ORAL | Status: DC | PRN

## 2017-06-24 MED ORDER — ACETAMINOPHEN 325 MG PO TABS: 650.0000 mg | ORAL_TABLET | Freq: Four times a day (QID) | ORAL | Status: DC | PRN

## 2017-06-24 MED ORDER — SODIUM CHLORIDE 0.9% FLUSH: 3.0000 mL | Freq: Two times a day (BID) | INTRAVENOUS | Status: DC

## 2017-06-24 MED ORDER — HEPARIN SODIUM (PORCINE) 5000 UNIT/ML IJ SOLN
5000.0000 [IU] | Freq: Three times a day (TID) | INTRAMUSCULAR | Status: DC
  Administered 2017-06-24 – 2017-06-26 (×5): 5000 [IU] via SUBCUTANEOUS
  Filled 2017-06-24 (×5): qty 1

## 2017-06-24 MED ORDER — TORSEMIDE 20 MG PO TABS: 20.0000 mg | ORAL_TABLET | Freq: Two times a day (BID) | ORAL | Status: DC

## 2017-06-24 MED ORDER — ACETAMINOPHEN 650 MG RE SUPP: 650.0000 mg | Freq: Four times a day (QID) | RECTAL | Status: DC | PRN

## 2017-06-24 MED ORDER — ONDANSETRON HCL 4 MG/2ML IJ SOLN: 4.0000 mg | Freq: Four times a day (QID) | INTRAMUSCULAR | Status: DC | PRN

## 2017-06-24 MED ORDER — SODIUM CHLORIDE 0.9 % IV SOLN
INTRAVENOUS | Status: AC
  Administered 2017-06-24 – 2017-06-25 (×2): via INTRAVENOUS

## 2017-06-24 MED ORDER — HYDRALAZINE HCL 20 MG/ML IJ SOLN: 10.0000 mg | Freq: Three times a day (TID) | INTRAMUSCULAR | Status: DC | PRN

## 2017-06-24 NOTE — Progress Notes (Signed)
Patient arrived to floor at [place], pt was promptly met by Dr Aggie Moats, therefore limiting this nurses ability to assess further than document. Fall risk assessed, bundle implemented- unclear if the pt is bones/blood risk- will need further assessment by night shift.  Board completed. Pts wife at bedside.  Pt c/a/ox3, VS WNL. No complaints at this time.

## 2017-06-24 NOTE — ED Triage Notes (Signed)
Pt here from work with c/o dizziness that has been ongoing for weeks ,pt has been falling for a few weeks , nad upon arrival

## 2017-06-24 NOTE — Telephone Encounter (Signed)
Pt directed to [facility] ED to be evaluated.

## 2017-06-24 NOTE — Telephone Encounter (Signed)
Mr Haugan states he is light headed and doesn't feel stable. Wife thinks he had a seizure Wednesday night- states he passed out. Has happened a couple of times recently.

## 2017-06-24 NOTE — ED Notes (Signed)
Pt provided with graham crackers and PB

## 2017-06-24 NOTE — ED Notes (Signed)
Patient transported to CT 

## 2017-06-24 NOTE — ED Provider Notes (Signed)
[facility] DEPT Provider Note   CSN: [id number] Arrival date & time: 06/24/17  1413     History   Chief Complaint Chief Complaint  Patient presents with  . Dizziness    HPI Johnny Mitchell is a 76 y.o. male.  The history is provided by the patient and the EMS personnel. No language interpreter was used.  Dizziness   Johnny Mitchell is a 76 y.o. male who presents to the Emergency Department complaining of dizziness.  He reports waxing waning dizziness of this been ongoing for the last several weeks. 2 days ago he had a syncopal event. Currently he denies any dizziness. Symptoms are very difficult to describe. 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 He also has frequent falls. Last fall was several days ago. He denies any headache, chest pain, shortness of breath, vomiting, vomiting, diarrhea. Past Medical History:  Diagnosis Date  . Adenomatous colon polyp 2003   max size 27mm  . Amyloidosis    seen at [facility]  . Atrial fibrillation ([place])   . Benign neoplasm of colon   . Cancer (HCC)    amyloidosis  . Cerebrovascular disease, unspecified   . Chronic venous insufficiency 2016  . Coronary atherosclerosis of unspecified type of vessel, native or graft    Non obstructive  . Cough, persistent 09/29/2015  . DDD (degenerative disc disease), lumbar    Dr. Katherine Roan  . Degenerative disc disease   . Diverticulosis of colon (without mention of hemorrhage)   . Edema 08/29/2013  . Hemangioma   . Hx of cardiovascular stress test    Adenosine Myoview (02/2014): Apical thinning, no ischemia, EF 48%; low risk.  Marland Kitchen Hypertension   . LBBB (left bundle branch block)   . Low back pain    chronic, seeing pain management  . 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 OSA (obstructive sleep apnea)   . Pure hypercholesterolemia   . Scrotal pain  10/23/2014  . Shortness of breath dyspnea   . 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 Traumatic open wound of left lower leg with delayed healing    Seen at [facility] on 05/29/2015  . Unspecified hemorrhoids without mention of complication   . Wide-complex tachycardia ([place]) 01/2011   Felt likely be SVT with abbarancy    Patient Active Problem List   Diagnosis Date Noted  . Vitamin B12 deficiency 06/17/2017  . Poor venous access 06/07/2017  . Fall 05/25/2017  . Deficiency anemia 05/02/2017  . Goals of care, counseling/discussion 04/26/2017  . Iron deficiency anemia 04/11/2017  . Annual physical exam 01/12/2017  . Paroxysmal atrial fibrillation (HCC)   . Acute on chronic diastolic CHF (congestive heart failure), NYHA class 1 ([place])   . Uncomplicated alcohol dependence ([place])   . Syncope and collapse 02/05/2016  . Normocytic normochromic anemia 02/05/2016  . Persistent atrial fibrillation ([place]) 02/05/2016  . Syncope 02/05/2016  . Renal failure (ARF), acute on chronic (HCC) 02/05/2016  . PCP NOTES >>>>>>> 10/28/2015  . Cough, persistent 09/29/2015  . Chronic kidney disease (CKD), stage III (moderate) 06/26/2015  . DDD (degenerative disc disease), lumbar 05/27/2015  . 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 Wound healing, delayed 03/27/2015  . Left leg pain 02/27/2015  . Chronic venous insufficiency 02/20/2015  . Traumatic open wound of left lower leg with delayed healing 02/20/2015  .  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 Scrotal pain 10/23/2014  . Dyspnea on exertion 10/23/2014  . Acute on chronic diastolic congestive heart failure ([place]) 09/30/2014  . Edema 08/29/2013  . Osteopenia 08/29/2013  . Chronic diastolic heart failure ([place]) 06/01/2013  . Atrial fibrillation ([place]) 05/31/2013  . Hyperkalemia 05/31/2013  . Pedal edema 05/11/2012  . Obstructive sleep apnea 02/17/2011  . PAROXYSMAL SUPRAVENTRICULAR TACHYCARDIA 02/11/2011  . Amyloidosis ([place])  12/19/2009  . LEUKOCYTOSIS 11/14/2009  . DYSPNEA 10/20/2009  . CHEST PAIN 09/19/2009  . BLOOD IN STOOL 08/14/2009  . BACK PAIN, LUMBAR 01/13/2009  . HEMANGIOMA OF OTHER SITES 12/31/2007  . CEREBROVASCULAR DISEASE 12/31/2007  . SPONDYLOSIS, CERVICAL, WITHOUT MYELOPATHY 12/31/2007  . DIVERTICULOSIS OF COLON 12/27/2007  . COLONIC POLYPS 12/26/2007  . HYPERCHOLESTEROLEMIA 12/26/2007  . Obesity 12/26/2007  . Essential hypertension 12/26/2007  . Coronary atherosclerosis 12/26/2007  . HEMORRHOIDS 12/26/2007  . DEGENERATIVE JOINT DISEASE 12/26/2007    Past Surgical History:  Procedure Laterality Date  . BACK SURGERY  2012   Lumbar, [name]  . 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 CARDIOVERSION N/A 07/10/2013   Procedure: CARDIOVERSION;  Surgeon: Lelon Perla, MD;  Location: [facility] ENDOSCOPY;  Service: Cardiovascular;  Laterality: N/A;  . CARDIOVERSION N/A 02/06/2016   Procedure: CARDIOVERSION;  Surgeon: Skeet Latch, MD;  Location: [facility];  Service: Cardiovascular;  Laterality: N/A;  . CARPAL TUNNEL RELEASE  08/2006   Dr Doy Mince at [place]    . IR FLUORO GUIDE PORT INSERTION RIGHT  06/13/2017  . IR US GUIDE VASC ACCESS RIGHT  06/13/2017  . KNEE ARTHROSCOPY    . Lumbar Medial Branch Block  2017   left L2-5, S1 and right L2-5 and S1 medial branches       Home Medications    Prior to Admission medications   Medication Sig Start Date End Date Taking? Authorizing Provider  acyclovir (ZOVIRAX) 400 MG tablet Take 1 tablet (400 mg total) by mouth daily. 04/25/17   Heath Lark, MD  amiodarone (PACERONE) 200 MG tablet Take 1 tablet (200 mg total) by mouth daily. 03/21/17   Lelon Perla, MD  dexamethasone (DECADRON) 4 MG tablet Take 1 tablet (4 mg total) by mouth daily. 04/25/17   Heath Lark, MD  ondansetron (ZOFRAN) 8 MG tablet Take 1 tablet (8 mg total) by mouth 2 (two) times daily as needed (Nausea or vomiting). 04/25/17   Heath Lark, MD    pravastatin (PRAVACHOL) 40 MG tablet Take 1 tablet (40 mg total) by mouth every evening. 03/21/17 06/19/17  Lelon Perla, MD  prochlorperazine (COMPAZINE) 10 MG tablet Take 1 tablet (10 mg total) by mouth every 6 (six) hours as needed (Nausea or vomiting). 04/25/17   Heath Lark, MD  spironolactone (ALDACTONE) 25 MG tablet Take 1 tablet (25 mg total) by mouth daily. 05/24/17   Heath Lark, MD  torsemide (DEMADEX) 20 MG tablet Take 1 tablet (20 mg total) by mouth 2 (two) times daily. 06/08/17 09/06/17  Lelon Perla, MD    Family History Family History  Problem Relation Age of Onset  . Lung cancer Mother   . Skin cancer Father        Melanoma  . Colon cancer Neg Hx   . Colon polyps Neg Hx   . Kidney disease Neg Hx   . Esophageal cancer Neg Hx   . Diabetes Neg Hx   . Gallbladder disease Neg Hx  Social History Social History  Substance Use Topics  . Smoking status: Never Smoker  . Smokeless tobacco: Never Used  . Alcohol use 0.0 oz/week     Comment: Occassionally     Allergies   Patient has no known allergies.   Review of Systems Review of Systems  Neurological: Positive for dizziness.  All other systems reviewed and are negative.    Physical Exam Updated Vital Signs BP 119/62   Pulse 94   Temp 98.3 F (36.8 C) (Oral)   Resp 18   SpO2 100%   Physical Exam  Constitutional: He is oriented to person, place, and time. He appears well-developed and well-nourished.  HENT:  Head: Normocephalic.  Ecchymosis to the left lower chin, left periorbital region  Eyes: Pupils are equal, round, and reactive to light. EOM are normal.  Cardiovascular: Normal rate and regular rhythm.   No murmur heard. Pulmonary/Chest: Effort normal and breath sounds normal. No respiratory distress.  Ecchymoses to the anterior chest  Abdominal: Soft. There is no tenderness. There is no rebound and no guarding.  Musculoskeletal: He exhibits no tenderness.  1+ pitting edema to bilateral  lower extremities. Multiple ecchymoses over her bilateral upper extremities.  Neurological: He is alert and oriented to person, place, and time.  Cranial nerves grossly intact. Visual fields grossly intact. No pronator drift. No ataxia or tenderness bilaterally. 5 out of 5 strength in all 4 extremities with sensation to light touch intact in all 4 extremities  Skin: Skin is warm and dry.  Psychiatric: He has a normal mood and affect. His behavior is normal.  Nursing note and vitals reviewed.    ED Treatments / Results  Labs (all labs ordered are listed, but only abnormal results are displayed) Labs Reviewed  CBC WITH DIFFERENTIAL/PLATELET - Abnormal; Notable for the following:       Result Value   WBC 2.7 (*)    RBC 3.63 (*)    Hemoglobin 9.0 (*)    HCT 28.5 (*)    MCH 24.8 (*)    RDW 27.3 (*)    All other components within normal limits  COMPREHENSIVE METABOLIC PANEL  I-STAT TROPOININ, ED  TYPE AND SCREEN    EKG  EKG Interpretation None       Radiology No results found.  Procedures Procedures (including critical care time)  Medications Ordered in ED Medications - No data to display   Initial Impression / Assessment and Plan / ED Course  I have reviewed the triage vital signs and the nursing notes.  Pertinent labs & imaging results that were available during my care of the patient were reviewed by me and considered in my medical decision making (see chart for details).     Additional history available from the patient's wife after patient's initial EKG arrival: on Wednesday when he went to get up from a couch he fell back and was unresponsive for about a minute and a half with moaning, shaking.  After the episode stopped he was immediately back to baseline.  He had a similar episode a week ago but he did not have any shaking at that time.  Patient here for evaluation of dizziness that is difficult for him to describe. His wife reports 2 syncopal events that have  features concerning for syncope versus seizure. He is neurologically intact on examination with some mild orthostasis. Plan to admit for observation and further testing.  Final Clinical Impressions(s) / ED Diagnoses   Final diagnoses:  None  New Prescriptions New Prescriptions   No medications on file     Quintella Reichert, MD 06/25/17 [phone]

## 2017-06-24 NOTE — H&P (Addendum)
[facility] History and Physical  Johnny Mitchell:[id number] DOB: 1941/08/10 DOA: 06/24/2017  Referring physician:  PCP: Colon Branch, MD   Chief Complaint: "She says I passed out."  HPI: Johnny Mitchell is a 76 y.o. male  with past medical history of amyloidosis, colon cancer, CVA, left bundle branch block, hypertension presents to the hospital with chief complaint of syncope. Patient states he has a history of at least several months of going from sitting to standing and passing out. Patient says that this problem has increased in frequency over the last week or so. In the past patient has sustained bleeding injuries from passing out.  Of note pt recently has been switched from lasix to torsemide "to pull more fluid off". Patient says this is been very effective. Has been going on for the last 2 weeks. Prompted by shortness of breath and increased lower extremities swelling.   Review of Systems:  As per HPI otherwise 10 point review of systems negative.    Past Medical History:  Diagnosis Date  . Adenomatous colon polyp 2003   max size 69mm  . Amyloidosis    seen at [facility]  . Atrial fibrillation ([place])   . Benign neoplasm of colon   . Cancer (HCC)    amyloidosis  . Cerebrovascular disease, unspecified   . Chronic venous insufficiency 2016  . Coronary atherosclerosis of unspecified type of vessel, native or graft    Non obstructive  . Cough, persistent 09/29/2015  . DDD (degenerative disc disease), lumbar    Dr. Katherine Roan  . Degenerative disc disease   . Diverticulosis of colon (without mention of hemorrhage)   . Edema 08/29/2013  . Hemangioma   . Hx of cardiovascular stress test    Adenosine Myoview (02/2014): Apical thinning, no ischemia, EF 48%; low risk.  Marland Kitchen Hypertension   . LBBB (left bundle branch block)   . Low back pain    chronic, seeing pain management  . Non-pressure chronic ulcer left lower leg, limited to breakdown skin ([place])    Seen at [facility]  on 05/29/2015  . OSA (obstructive sleep apnea)   . Pure hypercholesterolemia   . Scrotal pain 10/23/2014  . Shortness of breath dyspnea   . 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 Traumatic open wound of left lower leg with delayed healing    Seen at [facility] on 05/29/2015  . Unspecified hemorrhoids without mention of complication   . Wide-complex tachycardia ([place]) 01/2011   Felt likely be SVT with abbarancy   Past Surgical History:  Procedure Laterality Date  . BACK SURGERY  2012   Lumbar, [name]  . 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 CARDIOVERSION N/A 07/10/2013   Procedure: CARDIOVERSION;  Surgeon: Lelon Perla, MD;  Location: [facility] ENDOSCOPY;  Service: Cardiovascular;  Laterality: N/A;  . CARDIOVERSION N/A 02/06/2016   Procedure: CARDIOVERSION;  Surgeon: Skeet Latch, MD;  Location: [place];  Service: Cardiovascular;  Laterality: N/A;  . CARPAL TUNNEL RELEASE  08/2006   Dr Doy Mince at [place]    . IR FLUORO GUIDE PORT INSERTION RIGHT  06/13/2017  . IR US GUIDE VASC ACCESS RIGHT  06/13/2017  . KNEE ARTHROSCOPY    . Lumbar Medial Branch Block  2017   left L2-5, S1 and right L2-5 and S1 medial branches   Social History:  reports that he has never smoked. He has never used smokeless tobacco. He  reports that he drinks alcohol. He reports that he does not use drugs.  No Known Allergies  Family History  Problem Relation Age of Onset  . Lung cancer Mother   . Skin cancer Father        Melanoma  . Colon cancer Neg Hx   . Colon polyps Neg Hx   . Kidney disease Neg Hx   . Esophageal cancer Neg Hx   . Diabetes Neg Hx   . Gallbladder disease Neg Hx      Prior to Admission medications   Medication Sig Start Date End Date Taking? Authorizing Provider  acyclovir (ZOVIRAX) 400 MG tablet Take 1 tablet (400 mg total) by mouth daily. 04/25/17  Yes Gorsuch, Ni, MD  amiodarone (PACERONE) 200 MG tablet Take 1 tablet (200  mg total) by mouth daily. 03/21/17  Yes Lelon Perla, MD  dexamethasone (DECADRON) 4 MG tablet Take 1 tablet (4 mg total) by mouth daily. Patient taking differently: Take 4 mg by mouth every Monday. THE DAY PRIOR TO CHEMO 04/25/17  Yes Gorsuch, Ni, MD  folic acid (FOLVITE) 932 MCG tablet Take 800 mcg by mouth daily.   Yes [provider]  pravastatin (PRAVACHOL) 40 MG tablet Take 1 tablet (40 mg total) by mouth every evening. 03/21/17 06/24/17 Yes Lelon Perla, MD  spironolactone (ALDACTONE) 25 MG tablet Take 1 tablet (25 mg total) by mouth daily. 05/24/17  Yes Gorsuch, Ni, MD  torsemide (DEMADEX) 20 MG tablet Take 1 tablet (20 mg total) by mouth 2 (two) times daily. 06/08/17 09/06/17 Yes Lelon Perla, MD  ondansetron (ZOFRAN) 8 MG tablet Take 1 tablet (8 mg total) by mouth 2 (two) times daily as needed (Nausea or vomiting). Patient not taking: Reported on 06/24/2017 04/25/17   Heath Lark, MD  prochlorperazine (COMPAZINE) 10 MG tablet Take 1 tablet (10 mg total) by mouth every 6 (six) hours as needed (Nausea or vomiting). Patient not taking: Reported on 06/24/2017 04/25/17   Heath Lark, MD   Physical Exam: Vitals:   06/24/17 1522 06/24/17 1533 06/24/17 1545 06/24/17 1645  BP: 112/81 101/75 112/78 106/62  Pulse: 95 98 (!) 44 99  Resp: 14 15 (!) 21 15  Temp:      TempSrc:      SpO2: 100% 100% 100% 100%    Wt Readings from Last 3 Encounters:  06/21/17 114.1 kg (251 lb 9.6 oz)  06/08/17 117 kg (258 lb)  06/07/17 116 kg (255 lb 12.8 oz)    General:  Appears calm and comfortable; alert and oriented 3 Eyes:  PERRL, EOMI, normal lids, iris ENT:  grossly normal hearing, lips & tongue Neck:  no LAD, masses or thyromegaly Cardiovascular:  irr RR, no m/r/g. LE edema.  Respiratory:  CTA bilaterally, no w/r/r. Normal respiratory effort. Abdomen:  soft, ntnd Skin:  no rash or induration seen on limited exam; multiple bruises Musculoskeletal:  grossly normal tone  BUE/BLE Psychiatric:  grossly normal mood and affect, speech fluent and appropriate Neurologic: CN 2-12 grossly intact, moves all extremities in coordinated fashion.          Labs on Admission:  Basic Metabolic Panel:  Recent Labs Lab 06/21/17 0901 06/24/17 1425  NA 141 134*  K 4.1 4.1  CL  --  100*  CO2 26 26  GLUCOSE 108 98  BUN 42.2* 35*  CREATININE 1.7* 1.94*  CALCIUM 8.4 8.1*   Liver Function Tests:  Recent Labs Lab 06/21/17 0901 06/24/17 1425  AST 16 16  ALT 50 22  ALKPHOS 95 88  BILITOT 1.17 1.5*  PROT 5.8* 5.5*  ALBUMIN 3.1* 3.2*   No results for input(s): LIPASE, AMYLASE in the last 168 hours. No results for input(s): AMMONIA in the last 168 hours. CBC:  Recent Labs Lab 06/21/17 0901 06/24/17 1425  WBC 1.9* 2.7*  NEUTROABS 0.8* 0.8*  HGB 7.6* 9.0*  HCT 24.7* 28.5*  MCV 78.2* 78.5  PLT 47 Giant platelets present* 103*   Cardiac Enzymes: No results for input(s): CKTOTAL, CKMB, CKMBINDEX, TROPONINI in the last 168 hours.  BNP (last 3 results)  Recent Labs  03/21/17 1624 04/30/17 1140  BNP 224.6* 293.0*    ProBNP (last 3 results)  Recent Labs  12/22/16 0816  PROBNP 287.0*     Serum creatinine: 1.94 mg/dL (H) 06/24/17 1425 Estimated creatinine clearance: 40.3 mL/min (A)  CBG: No results for input(s): GLUCAP in the last 168 hours.  Radiological Exams on Admission: Ct Head Wo Contrast  Result Date: 06/24/2017 CLINICAL DATA:  Dizziness.  Multiple falls. EXAM: CT HEAD WITHOUT CONTRAST TECHNIQUE: Contiguous axial images were obtained from the base of the skull through the vertex without intravenous contrast. COMPARISON:  CT head without contrast 04/30/2017 FINDINGS: Brain: A remote posteromedial right parietal lobe infarct is again seen. Extensive white matter disease is unchanged. The basal ganglia are otherwise intact. No acute cortical infarct is present. The moderate generalized atrophy is stable. The ventricles are proportionate to  the degree of atrophy. No significant extra-axial fluid collection is present. Vascular: Atherosclerotic calcifications are present within the cavernous internal carotid artery's bilaterally. There is no hyperdense vessel. Skull: The calvarium is intact. No focal lytic or blastic lesions are present. Sinuses/Orbits: The paranasal sinuses and mastoid air cells are clear. No focal lytic or blastic lesions are present. The globes and orbits are within normal limits. IMPRESSION: 1. Acute intracranial abnormality or significant interval change. 2. 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 3. Stable extensive atrophy and white matter disease, likely reflecting the sequela of chronic microvascular ischemia. Electronically Signed   By: San Morelle M.D.   On: 06/24/2017 15:15    EKG: Independently reviewed. Afib. No stemi.  Assessment/Plan Principal Problem:   Syncope Active Problems:   Amyloidosis ([place])   Essential hypertension   Obstructive sleep apnea   Atrial fibrillation (HCC)  Syncope Likely orthostatic hypotension based on history, also dehydration due to increase in diuretic, pos orthostatics - echo ordered for AM - tele bed, cardiac monitoringprn - zofran prn for nausea - serial trop - will discuss possible blood transfusion with patient - consult to cardiology per patient request  CHF Continue Demadex, Aldactone  Herpes  continue acyclovir  Atrial fibrillation  continue amiodarone  High cholesterol Continue Pravachol  CKD Monitor Cr daily Cr at baseline,  ~1.7   Code Status: FULL DVT Prophylaxis: heparin Family Communication: wife at bedside Disposition Plan: Pending Improvement  Status: tele, obs  Elwin Mocha, MD Family Medicine [facility] [email or web address] Password [email or web address]

## 2017-06-24 NOTE — ED Notes (Signed)
Report attempted x1, Floor Rn to call back

## 2017-06-25 DIAGNOSIS — D638 Anemia in other chronic diseases classified elsewhere: Secondary | ICD-10-CM

## 2017-06-25 DIAGNOSIS — E859 Amyloidosis, unspecified: Secondary | ICD-10-CM | POA: Diagnosis not present

## 2017-06-25 DIAGNOSIS — I480 Paroxysmal atrial fibrillation: Secondary | ICD-10-CM

## 2017-06-25 DIAGNOSIS — R55 Syncope and collapse: Secondary | ICD-10-CM | POA: Diagnosis not present

## 2017-06-25 DIAGNOSIS — I951 Orthostatic hypotension: Secondary | ICD-10-CM

## 2017-06-25 LAB — BASIC METABOLIC PANEL
ANION GAP: 7 (ref 5–15)
BUN: 35 mg/dL — AB (ref 6–20)
CO2: 29 mmol/L (ref 22–32)
Calcium: 8 mg/dL — ABNORMAL LOW (ref 8.9–10.3)
Chloride: 104 mmol/L (ref 101–111)
Creatinine, Ser: 1.9 mg/dL — ABNORMAL HIGH (ref 0.61–1.24)
GFR calc Af Amer: 37 mL/min — ABNORMAL LOW (ref >60)
GFR calc non Af Amer: 32 mL/min — ABNORMAL LOW (ref >60)
Glucose, Bld: 118 mg/dL — ABNORMAL HIGH (ref 65–99)
POTASSIUM: 4.1 mmol/L (ref 3.5–5.1)
SODIUM: 140 mmol/L (ref 135–145)

## 2017-06-25 LAB — CBC
HCT: 26.2 % — ABNORMAL LOW (ref 39.0–52.0)
HEMOGLOBIN: 8.3 g/dL — AB (ref 13.0–17.0)
MCH: 25.1 pg — AB (ref 26.0–34.0)
MCHC: 31.7 g/dL (ref 30.0–36.0)
MCV: 79.2 fL (ref 78.0–100.0)
Platelets: 87 10*3/uL — ABNORMAL LOW (ref 150–400)
RBC: 3.31 MIL/uL — AB (ref 4.22–5.81)
RDW: 27.4 % — ABNORMAL HIGH (ref 11.5–15.5)
WBC: 2.3 10*3/uL — ABNORMAL LOW (ref 4.0–10.5)

## 2017-06-25 LAB — TROPONIN I: Troponin I: <0.03 ng/mL (ref <0.03)

## 2017-06-25 LAB — ABO/RH: ABO/RH(D): O POS

## 2017-06-25 MED ORDER — SODIUM CHLORIDE 0.9 % IV SOLN
INTRAVENOUS | Status: AC
  Administered 2017-06-25: 17:00:00 via INTRAVENOUS

## 2017-06-25 MED ORDER — SODIUM CHLORIDE 0.9% FLUSH
10.0000 mL | INTRAVENOUS | Status: DC | PRN
  Administered 2017-06-26: 10 mL
  Filled 2017-06-25: qty 40

## 2017-06-25 NOTE — Progress Notes (Signed)
[facility] PROGRESS NOTE  Johnny Mitchell DXA:[id number] DOB: [birth year] DOA: 06/24/2017  PCP: Patient, No Pcp Per  Brief History/Interval Summary: 76 year old Caucasian male with a past medical history of a malodorous loose, colon cancer, stroke, left bundle branch block,, paroxysmal atrial fibrillation not on anticoagulation, history of coronary artery disease, chronic diastolic CHF, chronic kidney disease stage III, who presented after having multiple syncopal episodes at home according to his wife. Patient was recently seen by his cardiologist and his diuretics were changed over to torsemide.  Reason for Visit: Syncope  Consultants: None  Procedures: None  Antibiotics: None  Subjective/Interval History: Patient feels well this morning. He denies any chest pain, shortness of breath. Wants to go home. Denies any headaches. He has a bruise on left face from a recent fall.  ROS: No nausea or vomiting  Objective:  Vital Signs  Vitals:   06/25/17 0154 06/25/17 0427 06/25/17 1012 06/25/17 1219  BP: 91/64 113/70 (!) 110/56 100/66  Pulse: 98 99 (!) 103 (!) 104  Resp: 18 20  20   Temp: 98.8 F (37.1 C) 97.7 F (36.5 C)  98.3 F (36.8 C)  TempSrc: Oral Oral  Oral  SpO2: 100% 98% 100% 100%  Weight:  112.5 kg (248 lb 1.6 oz)    Height:        Intake/Output Summary (Last 24 hours) at 06/25/17 1301 Last data filed at 06/25/17 1015  Gross per 24 hour  Intake          1521.67 ml  Output              701 ml  Net           820.67 ml   Filed Weights   06/24/17 1852 06/25/17 0427  Weight: 112.5 kg (248 lb) 112.5 kg (248 lb 1.6 oz)    General appearance: alert, cooperative, appears stated age and no distress Resp: clear to auscultation bilaterally Cardio: S1, S2 is irregularly irregular. No S3, S4. No rubs, or bruit. Systolic murmur appreciated over the precordium GI: soft, non-tender; bowel sounds normal; no masses,  no organomegaly Extremities: He is noted to have  1-2+ edema bilateral lower extremities which he says is stable to slightly improved from before Neurologic: Awake and alert. Oriented 3. No facial asymmetry. Tongue is midline. No focal neurological deficits  Lab Results:  Data Reviewed: I have personally reviewed following labs and imaging studies  CBC:  Recent Labs Lab 06/21/17 0901 06/24/17 1425 06/25/17 0216  WBC 1.9* 2.7* 2.3*  NEUTROABS 0.8* 0.8*  --   HGB 7.6* 9.0* 8.3*  HCT 24.7* 28.5* 26.2*  MCV 78.2* 78.5 79.2  PLT 47 Giant platelets present* 103* 87*    Basic Metabolic Panel:  Recent Labs Lab 06/21/17 0901 06/24/17 1425 06/25/17 0216  NA 141 134* 140  K 4.1 4.1 4.1  CL  --  100* 104  CO2 26 26 29   GLUCOSE 108 98 118*  BUN 42.2* 35* 35*  CREATININE 1.7* 1.94* 1.90*  CALCIUM 8.4 8.1* 8.0*    GFR: Estimated Creatinine Clearance: 40.9 mL/min (A) (by C-G formula based on SCr of 1.9 mg/dL (H)).  Liver Function Tests:  Recent Labs Lab 06/21/17 0901 06/24/17 1425  AST 16 16  ALT 50 22  ALKPHOS 95 88  BILITOT 1.17 1.5*  PROT 5.8* 5.5*  ALBUMIN 3.1* 3.2*    Cardiac Enzymes:  Recent Labs Lab 06/24/17 2026 06/25/17 0216  TROPONINI <0.03 <0.03     Radiology Studies: Ct  Head Wo Contrast  Result Date: 06/24/2017 CLINICAL DATA:  Dizziness.  Multiple falls. EXAM: CT HEAD WITHOUT CONTRAST TECHNIQUE: Contiguous axial images were obtained from the base of the skull through the vertex without intravenous contrast. COMPARISON:  CT head without contrast 04/30/2017 FINDINGS: Brain: A remote posteromedial right parietal lobe infarct is again seen. Extensive white matter disease is unchanged. The basal ganglia are otherwise intact. No acute cortical infarct is present. The moderate generalized atrophy is stable. The ventricles are proportionate to the degree of atrophy. No significant extra-axial fluid collection is present. Vascular: Atherosclerotic calcifications are present within the cavernous internal  carotid artery's bilaterally. There is no hyperdense vessel. Skull: The calvarium is intact. No focal lytic or blastic lesions are present. Sinuses/Orbits: The paranasal sinuses and mastoid air cells are clear. No focal lytic or blastic lesions are present. The globes and orbits are within normal limits. IMPRESSION: 1. Acute intracranial abnormality or significant interval change. 2. 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 3. Stable extensive atrophy and white matter disease, likely reflecting the sequela of chronic microvascular ischemia. Electronically Signed   By: San Morelle M.D.   On: 06/24/2017 15:15     Medications:  Scheduled: . acyclovir  400 mg Oral Daily  . amiodarone  200 mg Oral Daily  . folic acid  1 mg Oral Daily  . heparin  5,000 Units Subcutaneous Q8H  . pravastatin  40 mg Oral QPM  . sodium chloride flush  3 mL Intravenous Q12H  . spironolactone  25 mg Oral Daily   Continuous:  IOE:[id number] **OR** acetaminophen, hydrALAZINE, ondansetron **OR** ondansetron (ZOFRAN) IV  Assessment/Plan:  Principal Problem:   Syncope Active Problems:   Amyloidosis (HCC)   Essential hypertension   Obstructive sleep apnea   Atrial fibrillation (HCC)    Syncope secondary to orthostatic hypotension Based on history, this is to be orthostatic hypotension. Vital signs were repeated this morning and blood pressure did drop when he went from lying to standing position. There was an increase in heart rate as well. However, patient did not get symptomatic. His presentation is most likely due to increase in dose of his diuretics recently. His weight was 117 kg when he was seen by his cardiologist in June and today's 112 kg. His Torsemide has been held for now. He was given gentle IV hydration. Recheck vital signs tomorrow. PT and OT evaluation.  History of chronic congestive heart failure, diastolic. Stable. See discussion above. We will resume his torsemide  at a lower dose at discharge. Continue spironolactone for now.  History of paroxysmal atrial fibrillation. Slightly tachycardic this morning. Continue amiodarone. He is not on anticoagulation per his preference. He's also had multiple falls  History of coronary artery disease/hyperlipidemia. Stable. Continue home medications.  History of chronic kidney disease stage III Creatinine is about his usual baseline. Monitor urine output.  Vitamin B-12 deficiency. He receives B-12 injections by Heme/onc  Anemia of chronic disease. Followed by hematology oncology. He has received blood transfusions in the past. Anemia also thought to be secondary to side effects of chemotherapy that he is receiving for amyloidosis. No evidence for overt bleeding. Recently transfused 2 units on 7/10. Recheck hemoglobin tomorrow.  Amyloidosis Followed by hematology/oncology (Dr. Alvy Bimler). He is on weekly dexamethasone and Velcade. Recently had a Port-A-Cath placed.  DVT Prophylaxis: Subcutaneous heparin    Code Status: Full code  Family Communication: Discussed the patient  Disposition Plan: Management as outlined above. PT and OT evaluation  LOS: 0 days   [facility] Pager [phone] 06/25/2017, 1:01 PM  If 7PM-7AM, please contact night-coverage at [email or web address], password [facility]

## 2017-06-25 NOTE — Progress Notes (Signed)
Patient stable during 7 a to 7 p shift, VSS.  Heart rate atrial fibrillation with rate in the mid 90's to 110's.  Patient sat up in chair all of this shift, did work with PT and walked in hallways with RN without complaint or issue.  It is difficult for patient to get to standing but then walks very well with walker.  Wife at bedside most of shift.

## 2017-06-25 NOTE — Progress Notes (Signed)
Approximately 0030 , patient's bed alarm went off. This Probation officer was only one door away and therefore ran into patient's room. Upon entry,  patient was swift and already attempting to get out of bed with left knee almost on the floor while holding tightly onto the rails. Patient was assisted to [facility] and back to bed. Bed alarm was then placed on more sensitive mode and then monitered more closely through out the night.

## 2017-06-25 NOTE — Progress Notes (Signed)
Orthostatics performed by RN, patient up with walker and standby assist to bed and then back to chair.  Denies any dizziness.  Wife questioning if these "spells" could be seizures but when questioned there does not appear to be anything that seems to indicate that.  Did notify MD of results of orthostatics and wifes question about workup for seizures.

## 2017-06-25 NOTE — Plan of Care (Signed)
Problem: Tissue Perfusion: Goal: Risk factors for ineffective tissue perfusion will decrease Outcome: Progressing Oxygen saturation remains in the mid 90s on room air   Problem: Activity: Goal: Risk for activity intolerance will decrease Outcome: Progressing Sat up in chair all of shift, walked in the hallways with walker and RN for standby assist

## 2017-06-25 NOTE — Evaluation (Signed)
Physical Therapy Evaluation Patient Details Name: Johnny Mitchell MRN: [id number] DOB: January 16, 1941 Today's Date: 06/25/2017   History of Present Illness  76 year old Caucasian male with a past medical history of a malodorous loose, colon cancer, stroke, left bundle branch block,, paroxysmal atrial fibrillation not on anticoagulation, history of coronary artery disease, chronic diastolic CHF, chronic kidney disease stage III, who presented after having multiple syncopal episodes at home according to his wife. Patient was recently seen by his cardiologist and his diuretics were changed over to torsemide.  Clinical Impression  Patient presents with problems listed below.  Will benefit from acute PT to maximize functional mobility prior to discharge.  Patient with decreased strength and balance, impacting functional mobility.  Feel patient would benefit from OP PT follow-up for continued therapy.  Recommend RW for home use for safety/fall prevention.    Follow Up Recommendations Outpatient PT;Supervision for mobility/OOB    Equipment Recommendations  Rolling walker with 5" wheels    Recommendations for Other Services       Precautions / Restrictions Precautions Precautions: Fall Precaution Comments: Prior falls with syncope Restrictions Weight Bearing Restrictions: No      Mobility  Bed Mobility               General bed mobility comments: Patient in chair  Transfers Overall transfer level: Needs assistance Equipment used: None Transfers: Sit to/from Stand Sit to Stand: Min assist;Mod assist         General transfer comment: Verbal cues for hand placement and technique.  Patient required 3 attempts before asking for assist to stand.  Mod assist to shift weight forward and rise to stance.  Once upright, patient with fair balance.  Ambulation/Gait Ambulation/Gait assistance: Min guard Ambulation Distance (Feet): 96 Feet Assistive device: None Gait Pattern/deviations:  Step-through pattern;Decreased stride length;Shuffle Gait velocity: decreased Gait velocity interpretation: Below normal speed for age/gender General Gait Details: Patient with fair balance.  Slow, slightly unsteady gait, with DOE.    Stairs            Wheelchair Mobility    Modified Rankin (Stroke Patients Only)       Balance                             High level balance activites: Direction changes;Turns;Sudden stops;Head turns High Level Balance Comments: Patient able to turn head to both sides briefly while maintaining balance.  Stops quickly, but requires increased time to turn 180*.             Pertinent Vitals/Pain Pain Assessment: No/denies pain    Home Living Family/patient expects to be discharged to:: Private residence Living Arrangements: Spouse/significant other Available Help at Discharge: Family;Available PRN/intermittently (Wife works as Cabin crew) Type of Home: [employer]:  (Walking stick)      Prior Function Level of Independence: Independent         Comments: Ecologist        Extremity/Trunk Assessment   Upper Extremity Assessment Upper Extremity Assessment: Overall WFL for tasks assessed    Lower Extremity Assessment Lower Extremity Assessment: Generalized weakness       Communication   Communication: No difficulties  Cognition Arousal/Alertness: Awake/alert Behavior During Therapy: Anxious;Impulsive Overall Cognitive Status: History of cognitive impairments - at baseline  General Comments: States he does not have CHF, wife corrected him.  Perseverating on "I'm going home today" and "Can I go home today"      General Comments      Exercises     Assessment/Plan    PT Assessment Patient needs continued PT services  PT Problem List Decreased strength;Decreased activity tolerance;Decreased balance;Decreased  mobility;Decreased cognition;Decreased knowledge of use of DME;Cardiopulmonary status limiting activity;Obesity       PT Treatment Interventions DME instruction;Gait training;Functional mobility training;Therapeutic activities;Therapeutic exercise;Balance training;Cognitive remediation;Patient/family education    PT Goals (Current goals can be found in the Care Plan section)  Acute Rehab PT Goals Patient Stated Goal: To go home today PT Goal Formulation: With patient/family Time For Goal Achievement: 07/02/17 Potential to Achieve Goals: Good    Frequency Min 3X/week   Barriers to discharge        Co-evaluation               AM-PAC PT "6 Clicks" Daily Activity  Outcome Measure Difficulty turning over in bed (including adjusting bedclothes, sheets and blankets)?: None Difficulty moving from lying on back to sitting on the side of the bed? : Total Difficulty sitting down on and standing up from a chair with arms (e.g., wheelchair, bedside commode, etc,.)?: Total Help needed moving to and from a bed to chair (including a wheelchair)?: A Little Help needed walking in hospital room?: A Little Help needed climbing 3-5 steps with a railing? : A Lot 6 Click Score: 14    End of Session Equipment Utilized During Treatment: Gait belt Activity Tolerance: Patient limited by fatigue (DOE) Patient left: in chair;with call bell/phone within reach;with family/visitor present;with nursing/sitter in room Nurse Communication: Mobility status (Recommend OP PT and RW for d/c) PT Visit Diagnosis: Unsteadiness on feet (R26.81);Other abnormalities of gait and mobility (R26.89);Repeated falls (R29.6);Muscle weakness (generalized) (M62.81)    Time: 1202-1221 PT Time Calculation (min) (ACUTE ONLY): 19 min   Charges:   PT Evaluation $PT Eval Moderate Complexity: 1 Procedure     PT G Codes:   PT G-Codes **NOT FOR INPATIENT CLASS** Functional Assessment Tool Used: AM-PAC 6 Clicks Basic  Mobility Functional Limitation: Mobility: Walking and moving around Mobility: Walking and Moving Around Current Status (I6270): At least 40 percent but less than 60 percent impaired, limited or restricted Mobility: Walking and Moving Around Goal Status [phone]): At least 1 percent but less than 20 percent impaired, limited or restricted    Carita Pian. Sanjuana Kava, [facility] Pager [place] 06/25/2017, 4:19 PM

## 2017-06-26 DIAGNOSIS — I480 Paroxysmal atrial fibrillation: Secondary | ICD-10-CM | POA: Diagnosis not present

## 2017-06-26 DIAGNOSIS — I951 Orthostatic hypotension: Secondary | ICD-10-CM | POA: Diagnosis not present

## 2017-06-26 LAB — PREPARE RBC (CROSSMATCH)

## 2017-06-26 LAB — CBC
HCT: 25.5 % — ABNORMAL LOW (ref 39.0–52.0)
Hemoglobin: 7.8 g/dL — ABNORMAL LOW (ref 13.0–17.0)
MCH: 24.4 pg — ABNORMAL LOW (ref 26.0–34.0)
MCHC: 30.6 g/dL (ref 30.0–36.0)
MCV: 79.7 fL (ref 78.0–100.0)
PLATELETS: 81 10*3/uL — AB (ref 150–400)
RBC: 3.2 MIL/uL — ABNORMAL LOW (ref 4.22–5.81)
RDW: 27.7 % — AB (ref 11.5–15.5)
WBC: 1.9 10*3/uL — AB (ref 4.0–10.5)

## 2017-06-26 LAB — BASIC METABOLIC PANEL
ANION GAP: 4 — AB (ref 5–15)
BUN: 28 mg/dL — ABNORMAL HIGH (ref 6–20)
CALCIUM: 7.9 mg/dL — AB (ref 8.9–10.3)
CO2: 27 mmol/L (ref 22–32)
CREATININE: 1.6 mg/dL — AB (ref 0.61–1.24)
Chloride: 108 mmol/L (ref 101–111)
GFR, EST AFRICAN AMERICAN: 46 mL/min — AB (ref >60)
GFR, EST NON AFRICAN AMERICAN: 40 mL/min — AB (ref >60)
GLUCOSE: 98 mg/dL (ref 65–99)
Potassium: 4.1 mmol/L (ref 3.5–5.1)
Sodium: 139 mmol/L (ref 135–145)

## 2017-06-26 MED ORDER — FUROSEMIDE 10 MG/ML IJ SOLN
20.0000 mg | Freq: Once | INTRAMUSCULAR | Status: AC
  Administered 2017-06-26: 20 mg via INTRAVENOUS
  Filled 2017-06-26: qty 2

## 2017-06-26 MED ORDER — HEPARIN SOD (PORK) LOCK FLUSH 100 UNIT/ML IV SOLN
500.0000 [IU] | INTRAVENOUS | Status: AC | PRN
  Administered 2017-06-26: 500 [IU]

## 2017-06-26 MED ORDER — TORSEMIDE 20 MG PO TABS: 20.0000 mg | ORAL_TABLET | Freq: Every day | ORAL | 3 refills | Status: DC

## 2017-06-26 MED ORDER — SODIUM CHLORIDE 0.9 % IV SOLN
Freq: Once | INTRAVENOUS | Status: AC
  Administered 2017-06-26: 11:00:00 via INTRAVENOUS

## 2017-06-26 MED ORDER — FUROSEMIDE 10 MG/ML IJ SOLN
20.0000 mg | Freq: Once | INTRAMUSCULAR | Status: AC
Start: 2017-06-26 — End: 2017-06-26
  Administered 2017-06-26: 20 mg via INTRAVENOUS
  Filled 2017-06-26: qty 2

## 2017-06-26 MED ORDER — FUROSEMIDE 10 MG/ML IJ SOLN
INTRAMUSCULAR | Status: AC
  Filled 2017-06-26: qty 2

## 2017-06-26 NOTE — Discharge Summary (Signed)
[facility]  Physician Discharge Summary   Patient ID: Johnny Mitchell MRN: [id number] DOB/AGE: Apr 14, 1941 76 y.o.  Admit date: 06/24/2017 Discharge date: 06/26/2017  PCP: Patient, No Pcp Per  DISCHARGE DIAGNOSES:  Principal Problem:   Syncope Active Problems:   Amyloidosis ([place])   Essential hypertension   Obstructive sleep apnea   Atrial fibrillation ([facility])   RECOMMENDATIONS FOR OUTPATIENT FOLLOW UP: 1. Patient's torsemide dose was reduced. His volume status needs to be monitored.   DISCHARGE CONDITION: fair  Diet recommendation: As before  Filed Weights   06/24/17 1852 06/25/17 0427 06/26/17 0601  Weight: 112.5 kg (248 lb) 112.5 kg (248 lb 1.6 oz) 112.5 kg (248 lb)    INITIAL HISTORY: 76 year old Caucasian male with a past medical history of a malodorous loose, colon cancer, stroke, left bundle branch block,, paroxysmal atrial fibrillation not on anticoagulation, history of coronary artery disease, chronic diastolic CHF, chronic kidney disease stage III, who presented after having multiple syncopal episodes at home according to his wife. Patient was recently seen by his cardiologist and his diuretics were changed over to torsemide.   HOSPITAL COURSE:   Syncope secondary to orthostatic hypotension Based on history, this is thought to be orthostatic hypotension. Orthostatic vital signs were checked. There has been improvement this morning. His presentation is most likely due to increase in dose of his diuretics recently. His weight was 117 kg when he was seen by his cardiologist in June and here he weighed 112 kg. His Torsemide was held initially. Can be resumed at a lower dose. He has an appointment with his cardiologist on Wednesday.   History of chronic congestive heart failure, diastolic. Stable. See discussion above. We will resume his torsemide at a lower dose at discharge. He may continue his other home medications.  History of paroxysmal atrial  fibrillation. Continue amiodarone. He is not on anticoagulation per his preference as per previous notes. He's also had multiple falls.  History of coronary artery disease/hyperlipidemia. Stable. Continue home medications.  History of chronic kidney disease stage III Creatinine is about his usual baseline.  Vitamin B-12 deficiency. He receives B-12 injections by Heme/onc.  Anemia of chronic disease. Followed by hematology oncology. He has received blood transfusions in the past and as recently as 7/10. Anemia thought to be secondary to side effects of chemotherapy that he is receiving for amyloidosis. No evidence for overt bleeding. Hemoglobin did drop down to 7.8 here in the hospital. He was transfused another 2 units. He has blood work coming up with hematology oncology in 2 days. This can be monitored by the clinic.  Amyloidosis Followed by hematology/oncology (Dr. Alvy Bimler). He is on weekly dexamethasone and Velcade. Recently had a Port-A-Cath placed.  Overall, stable. Feels much better. Seen by physical therapy. Outpatient PT recommended. Ambulatory referral sent. Stable for discharge.   PERTINENT LABS:  The results of significant diagnostics from this hospitalization (including imaging, microbiology, ancillary and laboratory) are listed below for reference.    Microbiology: Recent Results (from the past 240 hour(s))  TECHNOLOGIST REVIEW     Status: None   Collection Time: 06/21/17  9:01 AM  Result Value Ref Range Status   Technologist Review   Final    occ myelo, atypical monocytes, few ovalos, targets and acanthos, mod schistocyes and burr cells     Labs: Basic Metabolic Panel:  Recent Labs Lab 06/21/17 0901 06/24/17 1425 06/25/17 0216 06/26/17 0326  NA 141 134* 140 139  K 4.1 4.1 4.1 4.1  CL  --  100* 104 108  CO2 26 26 29 27   GLUCOSE 108 98 118* 98  BUN 42.2* 35* 35* 28*  CREATININE 1.7* 1.94* 1.90* 1.60*  CALCIUM 8.4 8.1* 8.0* 7.9*   Liver Function  Tests:  Recent Labs Lab 06/21/17 0901 06/24/17 1425  AST 16 16  ALT 50 22  ALKPHOS 95 88  BILITOT 1.17 1.5*  PROT 5.8* 5.5*  ALBUMIN 3.1* 3.2*   CBC:  Recent Labs Lab 06/21/17 0901 06/24/17 1425 06/25/17 0216 06/26/17 0326  WBC 1.9* 2.7* 2.3* 1.9*  NEUTROABS 0.8* 0.8*  --   --   HGB 7.6* 9.0* 8.3* 7.8*  HCT 24.7* 28.5* 26.2* 25.5*  MCV 78.2* 78.5 79.2 79.7  PLT 47 Giant platelets present* 103* 87* 81*   Cardiac Enzymes:  Recent Labs Lab 06/24/17 2026 06/25/17 0216  TROPONINI <0.03 <0.03     IMAGING STUDIES Ct Head Wo Contrast  Result Date: 06/24/2017 CLINICAL DATA:  Dizziness.  Multiple falls. EXAM: CT HEAD WITHOUT CONTRAST TECHNIQUE: Contiguous axial images were obtained from the base of the skull through the vertex without intravenous contrast. COMPARISON:  CT head without contrast 04/30/2017 FINDINGS: Brain: A remote posteromedial right parietal lobe infarct is again seen. Extensive white matter disease is unchanged. The basal ganglia are otherwise intact. No acute cortical infarct is present. The moderate generalized atrophy is stable. The ventricles are proportionate to the degree of atrophy. No significant extra-axial fluid collection is present. Vascular: Atherosclerotic calcifications are present within the cavernous internal carotid artery's bilaterally. There is no hyperdense vessel. Skull: The calvarium is intact. No focal lytic or blastic lesions are present. Sinuses/Orbits: The paranasal sinuses and mastoid air cells are clear. No focal lytic or blastic lesions are present. The globes and orbits are within normal limits. IMPRESSION: 1. Acute intracranial abnormality or significant interval change. 2. 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 3. Stable extensive atrophy and white matter disease, likely reflecting the sequela of chronic microvascular ischemia. Electronically Signed   By: San Morelle M.D.   On: 06/24/2017 15:15   Ir  US Guide Vasc Access Right  Result Date: 06/13/2017 CLINICAL DATA:  History of amyloidosis and poor intravenous access. Referral for Port-A-Cath placement. EXAM: IMPLANTED PORT A CATH PLACEMENT WITH ULTRASOUND AND FLUOROSCOPIC GUIDANCE ANESTHESIA/SEDATION: 2.0 mg IV Versed; 100 mcg IV Fentanyl Total Moderate Sedation Time:  42 minutes. The patient's level of consciousness and physiologic status were continuously monitored during the procedure by Radiology nursing. Additional Medications: 2 g IV Ancef. As antibiotic prophylaxis, Ancef was ordered pre-procedure and administered intravenously within one hour of incision. FLUOROSCOPY TIME:  30 seconds.  16.0 mGy. PROCEDURE: The procedure, risks, benefits, and alternatives were explained to the patient. Questions regarding the procedure were encouraged and answered. The patient understands and consents to the procedure. A time-out was performed prior to initiating the procedure. Ultrasound was utilized to confirm patency of the right internal jugular vein. The right neck and chest were prepped with chlorhexidine in a sterile fashion, and a sterile drape was applied covering the operative field. Maximum barrier sterile technique with sterile gowns and gloves were used for the procedure. Local anesthesia was provided with 1% lidocaine. After creating a small venotomy incision, a 21 gauge needle was advanced into the right internal jugular vein under direct, real-time ultrasound guidance. Ultrasound image documentation was performed. After securing guidewire access, an 8 Fr dilator was placed. A J-wire was kinked to measure appropriate catheter length. A subcutaneous port pocket was then created along  the upper chest wall utilizing sharp and blunt dissection. Portable cautery was utilized. The pocket was irrigated with sterile saline. A single lumen power injectable port was chosen for placement. The 8 Fr catheter was tunneled from the port pocket site to the venotomy  incision. The port was placed in the pocket. External catheter was trimmed to appropriate length based on guidewire measurement. At the venotomy, an 8 Fr peel-away sheath was placed over a guidewire. The catheter was then placed through the sheath and the sheath removed. Final catheter positioning was confirmed and documented with a fluoroscopic spot image. The port was accessed with a needle and aspirated and flushed with heparinized saline. The access needle was removed. The venotomy and port pocket incisions were closed with subcutaneous 3-0 Monocryl and subcuticular 4-0 Vicryl. Dermabond was applied to both incisions. COMPLICATIONS: COMPLICATIONS None FINDINGS: After catheter placement, the tip lies at the cavo-atrial junction. The catheter aspirates normally and is ready for immediate use. IMPRESSION: Placement of single lumen port a cath via right internal jugular vein. The catheter tip lies at the cavo-atrial junction. A power injectable port a cath was placed and is ready for immediate use. Electronically Signed   By: Aletta Edouard M.D.   On: 06/13/2017 17:15   Ir Fluoro Guide Port Insertion Right  Result Date: 06/13/2017 CLINICAL DATA:  History of amyloidosis and poor intravenous access. Referral for Port-A-Cath placement. EXAM: IMPLANTED PORT A CATH PLACEMENT WITH ULTRASOUND AND FLUOROSCOPIC GUIDANCE ANESTHESIA/SEDATION: 2.0 mg IV Versed; 100 mcg IV Fentanyl Total Moderate Sedation Time:  42 minutes. The patient's level of consciousness and physiologic status were continuously monitored during the procedure by Radiology nursing. Additional Medications: 2 g IV Ancef. As antibiotic prophylaxis, Ancef was ordered pre-procedure and administered intravenously within one hour of incision. FLUOROSCOPY TIME:  30 seconds.  16.0 mGy. PROCEDURE: The procedure, risks, benefits, and alternatives were explained to the patient. Questions regarding the procedure were encouraged and answered. The patient understands  and consents to the procedure. A time-out was performed prior to initiating the procedure. Ultrasound was utilized to confirm patency of the right internal jugular vein. The right neck and chest were prepped with chlorhexidine in a sterile fashion, and a sterile drape was applied covering the operative field. Maximum barrier sterile technique with sterile gowns and gloves were used for the procedure. Local anesthesia was provided with 1% lidocaine. After creating a small venotomy incision, a 21 gauge needle was advanced into the right internal jugular vein under direct, real-time ultrasound guidance. Ultrasound image documentation was performed. After securing guidewire access, an 8 Fr dilator was placed. A J-wire was kinked to measure appropriate catheter length. A subcutaneous port pocket was then created along the upper chest wall utilizing sharp and blunt dissection. Portable cautery was utilized. The pocket was irrigated with sterile saline. A single lumen power injectable port was chosen for placement. The 8 Fr catheter was tunneled from the port pocket site to the venotomy incision. The port was placed in the pocket. External catheter was trimmed to appropriate length based on guidewire measurement. At the venotomy, an 8 Fr peel-away sheath was placed over a guidewire. The catheter was then placed through the sheath and the sheath removed. Final catheter positioning was confirmed and documented with a fluoroscopic spot image. The port was accessed with a needle and aspirated and flushed with heparinized saline. The access needle was removed. The venotomy and port pocket incisions were closed with subcutaneous 3-0 Monocryl and subcuticular 4-0 Vicryl. Dermabond was  applied to both incisions. COMPLICATIONS: COMPLICATIONS None FINDINGS: After catheter placement, the tip lies at the cavo-atrial junction. The catheter aspirates normally and is ready for immediate use. IMPRESSION: Placement of single lumen port a  cath via right internal jugular vein. The catheter tip lies at the cavo-atrial junction. A power injectable port a cath was placed and is ready for immediate use. Electronically Signed   By: Aletta Edouard M.D.   On: 06/13/2017 17:15    DISCHARGE EXAMINATION: Vitals:   06/26/17 1130 06/26/17 1305 06/26/17 1341 06/26/17 1528  BP: 105/84 (!) 102/54 113/65 108/77  Pulse: (!) 110 (!) 101 (!) 112 (!) 104  Resp: 18 16  18   Temp: 98 F (36.7 C) 98.6 F (37 C) 98.8 F (37.1 C) 98.9 F (37.2 C)  TempSrc: Oral Oral Oral Oral  SpO2: 100%  100% 99%  Weight:      Height:       General appearance: alert, cooperative, appears stated age and no distress Resp: clear to auscultation bilaterally Cardio: regular rate and rhythm, S1, S2 normal, no murmur, click, rub or gallop GI: soft, non-tender; bowel sounds normal; no masses,  no organomegaly  DISPOSITION: Home with wife  Discharge Instructions    Ambulatory referral to Physical Therapy    Complete by:  As directed    Call MD for:  difficulty breathing, headache or visual disturbances    Complete by:  As directed    Call MD for:  extreme fatigue    Complete by:  As directed    Call MD for:  persistant dizziness or light-headedness    Complete by:  As directed    Call MD for:  persistant nausea and vomiting    Complete by:  As directed    Call MD for:  severe uncontrolled pain    Complete by:  As directed    Call MD for:  temperature >100.4    Complete by:  As directed    Discharge instructions    Complete by:  As directed    Please keep her appointment with your oncologist on Tuesday. You also have an appointment with Dr. [name] on 7/18. Take your medications as prescribed. Please note changes made to your diuretic, torsemide. Get up slowly from a sitting or lying position. Seek attention immediately if symptoms recur.  You were cared for by a hospitalist during your hospital stay. If you have any questions about your discharge  medications or the care you received while you were in the hospital after you are discharged, you can call the unit and asked to speak with the hospitalist on call if the hospitalist that took care of you is not available. Once you are discharged, your primary care physician will handle any further medical issues. Please note that NO REFILLS for any discharge medications will be authorized once you are discharged, as it is imperative that you return to your primary care physician (or establish a relationship with a primary care physician if you do not have one) for your aftercare needs so that they can reassess your need for medications and monitor your lab values. If you do not have a primary care physician, you can call [phone] for a physician referral.   Increase activity slowly    Complete by:  As directed       ALLERGIES: No Known Allergies   Discharge Medication List as of 06/26/2017  4:04 PM    CONTINUE these medications which have CHANGED   Details  torsemide (DEMADEX) 20 MG tablet Take 1 tablet (20 mg total) by mouth daily., Starting Sun 06/26/2017, No Print      CONTINUE these medications which have NOT CHANGED   Details  acyclovir (ZOVIRAX) 400 MG tablet Take 1 tablet (400 mg total) by mouth daily., Starting Mon 04/25/2017, Normal    amiodarone (PACERONE) 200 MG tablet Take 1 tablet (200 mg total) by mouth daily., Starting Mon 03/21/2017, Normal    dexamethasone (DECADRON) 4 MG tablet Take 1 tablet (4 mg total) by mouth daily., Starting Mon 4/35/6861, Normal    folic acid (FOLVITE) 683 MCG tablet Take 800 mcg by mouth daily., Historical Med    pravastatin (PRAVACHOL) 40 MG tablet Take 1 tablet (40 mg total) by mouth every evening., Starting Mon 03/21/2017, Until Fri 06/24/2017, Normal    spironolactone (ALDACTONE) 25 MG tablet Take 1 tablet (25 mg total) by mouth daily., Starting Tue 05/24/2017, Normal    ondansetron (ZOFRAN) 8 MG tablet Take 1 tablet (8 mg total) by mouth 2 (two)  times daily as needed (Nausea or vomiting)., Starting Mon 04/25/2017, Normal    prochlorperazine (COMPAZINE) 10 MG tablet Take 1 tablet (10 mg total) by mouth every 6 (six) hours as needed (Nausea or vomiting)., Starting Mon 04/25/2017, Normal           TOTAL DISCHARGE TIME: 35 mins  [facility] Pager [phone]  06/26/2017, 5:05 PM

## 2017-06-26 NOTE — Discharge Instructions (Signed)
Syncope Syncope is when you lose temporarily pass out (faint). Signs that you may be about to pass out include:  Feeling dizzy or light-headed.  Feeling sick to your stomach (nauseous).  Seeing all white or all black.  Having cold, clammy skin.  If you passed out, get help right away. Call your local emergency services (911 in the [place] Do not drive yourself to the hospital. Follow these instructions at home: Pay attention to any changes in your symptoms. Take these actions to help with your condition:  Have someone stay with you until you feel stable.  Do not drive, use machinery, or play sports until your doctor says it is okay.  Keep all follow-up visits as told by your doctor. This is important.  If you start to feel like you might pass out, lie down right away and raise (elevate) your feet above the level of your heart. Breathe deeply and steadily. Wait until all of the symptoms are gone.  Drink enough fluid to keep your pee (urine) clear or pale yellow.  If you are taking blood pressure or heart medicine, get up slowly and spend many minutes getting ready to sit and then stand. This can help with dizziness.  Take over-the-counter and prescription medicines only as told by your doctor.  Get help right away if:  You have a very bad headache.  You have unusual pain in your chest, tummy, or back.  You are bleeding from your mouth or rectum.  You have black or tarry poop (stool).  You have a very fast or uneven heartbeat (palpitations).  It hurts to breathe.  You pass out once or more than once.  You have jerky movements that you cannot control (seizure).  You are confused.  You have trouble walking.  You are very weak.  You have vision problems. These symptoms may be an emergency. Do not wait to see if the symptoms will go away. Get medical help right away. Call your local emergency services (911 in the [place] Do not drive yourself to the hospital. This  information is not intended to replace advice given to you by your health care provider. Make sure you discuss any questions you have with your health care provider. Document Released: 05/17/2008 Document Revised: 05/06/2016 Document Reviewed: 08/13/2015 Elsevier Interactive Patient Education  2018 [employer]  

## 2017-06-27 LAB — TYPE AND SCREEN
ABO/RH(D): O POS
Antibody Screen: NEGATIVE
UNIT DIVISION: 0
Unit division: 0

## 2017-06-27 LAB — BPAM RBC
BLOOD PRODUCT EXPIRATION DATE: 201808072359
Blood Product Expiration Date: 201808072359
ISSUE DATE / TIME: 201807151316
ISSUE DATE / TIME: 201807151106
UNIT TYPE AND RH: 5100
Unit Type and Rh: 5100

## 2017-06-28 ENCOUNTER — Ambulatory Visit: Payer: Medicare Other

## 2017-06-28 ENCOUNTER — Other Ambulatory Visit (HOSPITAL_BASED_OUTPATIENT_CLINIC_OR_DEPARTMENT_OTHER): Payer: Medicare Other

## 2017-06-28 ENCOUNTER — Ambulatory Visit (HOSPITAL_BASED_OUTPATIENT_CLINIC_OR_DEPARTMENT_OTHER): Payer: Medicare Other

## 2017-06-28 VITALS — BP 128/72 | HR 105 | Temp 98.3°F | Resp 20

## 2017-06-28 DIAGNOSIS — E859 Amyloidosis, unspecified: Secondary | ICD-10-CM

## 2017-06-28 DIAGNOSIS — E538 Deficiency of other specified B group vitamins: Secondary | ICD-10-CM

## 2017-06-28 DIAGNOSIS — D630 Anemia in neoplastic disease: Secondary | ICD-10-CM

## 2017-06-28 LAB — COMPREHENSIVE METABOLIC PANEL
ALBUMIN: 3 g/dL — AB (ref 3.5–5.0)
ALK PHOS: 96 U/L (ref 40–150)
ALT: 15 U/L (ref 0–55)
ANION GAP: 9 meq/L (ref 3–11)
AST: 10 U/L (ref 5–34)
BILIRUBIN TOTAL: 1.42 mg/dL — AB (ref 0.20–1.20)
BUN: 32.9 mg/dL — ABNORMAL HIGH (ref 7.0–26.0)
CALCIUM: 8.6 mg/dL (ref 8.4–10.4)
CO2: 23 meq/L (ref 22–29)
CREATININE: 1.7 mg/dL — AB (ref 0.7–1.3)
Chloride: 106 mEq/L (ref 98–109)
EGFR: 39 mL/min/{1.73_m2} — AB (ref >90)
Glucose: 98 mg/dl (ref 70–140)
Potassium: 4.5 mEq/L (ref 3.5–5.1)
Sodium: 138 mEq/L (ref 136–145)
TOTAL PROTEIN: 5.7 g/dL — AB (ref 6.4–8.3)

## 2017-06-28 LAB — MANUAL DIFFERENTIAL
ALC: 0.7 10*3/uL — AB (ref 0.9–3.3)
ANC (CHCC MAN DIFF): 0.8 10*3/uL — AB (ref 1.5–6.5)
BASOPHIL: 1 % (ref 0–2)
Band Neutrophils: 5 % (ref 0–10)
Blasts: 0 % (ref 0–0)
EOS: 5 % (ref 0–7)
LYMPH: 36 % (ref 14–49)
METAMYELOCYTES PCT: 1 % — AB (ref 0–0)
MONO: 9 % (ref 0–14)
MYELOCYTES: 1 % — AB (ref 0–0)
PLT EST: DECREASED
PROMYELO: 0 % (ref 0–0)
SEG: 37 % — ABNORMAL LOW (ref 38–77)
VARIANT LYMPH: 0 % (ref 0–0)
nRBC: 1 % — ABNORMAL HIGH (ref 0–0)

## 2017-06-28 LAB — CBC WITH DIFFERENTIAL/PLATELET
HEMATOCRIT: 28.1 % — AB (ref 38.4–49.9)
HEMOGLOBIN: 9 g/dL — AB (ref 13.0–17.1)
MCH: 26 pg — AB (ref 27.2–33.4)
MCHC: 32 g/dL (ref 32.0–36.0)
MCV: 81.2 fL (ref 79.3–98.0)
PLATELETS: 89 10*3/uL — AB (ref 140–400)
RBC: 3.46 10*6/uL — ABNORMAL LOW (ref 4.20–5.82)
RDW: 25.9 % — ABNORMAL HIGH (ref 11.0–14.6)
WBC: 1.9 10*3/uL — AB (ref 4.0–10.3)

## 2017-06-28 LAB — PATHOLOGIST SMEAR REVIEW

## 2017-06-28 MED ORDER — BORTEZOMIB CHEMO SQ INJECTION 3.5 MG (2.5MG/ML): 1.3000 mg/m2 | Freq: Once | INTRAMUSCULAR | Status: DC

## 2017-06-28 MED ORDER — PROCHLORPERAZINE MALEATE 10 MG PO TABS: 10.0000 mg | ORAL_TABLET | Freq: Once | ORAL | Status: DC

## 2017-06-28 MED ORDER — SODIUM CHLORIDE 0.9% FLUSH
10.0000 mL | Freq: Once | INTRAVENOUS | Status: AC
  Administered 2017-06-28: 10 mL
  Filled 2017-06-28: qty 10

## 2017-06-28 MED ORDER — CYANOCOBALAMIN 1000 MCG/ML IJ SOLN
INTRAMUSCULAR | Status: AC
  Filled 2017-06-28: qty 1

## 2017-06-28 MED ORDER — CYANOCOBALAMIN 1000 MCG/ML IJ SOLN
1000.0000 ug | INTRAMUSCULAR | Status: DC
  Administered 2017-06-28: 1000 ug via INTRAMUSCULAR

## 2017-06-28 NOTE — Progress Notes (Signed)
Reviewed pt labs with Dr. Alvy Bimler and pharmacy. Pt ANC 0.8 and pt not to receive treatment. Pt to keep previously scheduled appointments and come back next week. Reviewed pt labs with pt and educated pt on neutropenic precautions. Pt verbalized understanding and had no further questions. Pt left ambulatory in no apparent distress.

## 2017-06-28 NOTE — Patient Instructions (Addendum)
Neutropenia Neutropenia is a condition that occurs when you have a lower-than-normal level of a type of white blood cell (neutrophil) in your body. Neutrophils are made in the spongy center of large bones (bone marrow) and they fight infections. Neutrophils are your body's main defense against bacterial and fungal infections. The fewer neutrophils you have and the longer your body remains without them, the greater your risk of getting a severe infection. What are the causes? This condition can occur if your body uses up or destroys neutrophils faster than your bone marrow can make them. This problem may happen because of:  Bacterial or fungal infection.  Allergic disorders.  Reactions to some medicines.  Autoimmune disease.  An enlarged spleen.  This condition can also occur if your bone marrow does not produce enough neutrophils. This problem may be caused by:  Cancer.  Cancer treatments, such as radiation or chemotherapy.  Viral infections.  Medicines, such as phenytoin.  Vitamin B12 deficiency.  Diseases of the bone marrow.  Environmental toxins, such as insecticides.  What are the signs or symptoms? This condition does not usually cause symptoms. If symptoms are present, they are usually caused by an underlying infection. Symptoms of an infection may include:  Fever.  Chills.  Swollen glands.  Oral or anal ulcers.  Cough and shortness of breath.  Rash.  Skin infection.  Fatigue.  How is this diagnosed? Your health care provider may suspect neutropenia if you have:  A condition that may cause neutropenia.  Symptoms of infection, especially fever.  Frequent and unusual infections.  You will have a medical history and physical exam. Tests will also be done, such as:  A complete blood count (CBC).  A procedure to collect a sample of bone marrow for examination (bone marrow biopsy).  A chest X-ray.  A urine culture.  A blood culture.  How is this  treated? Treatment depends on the underlying cause and severity of your condition. Mild neutropenia may not require treatment. Treatment may include medicines, such as:  Antibiotic medicine given through an IV tube.  Antiviral medicines.  Antifungal medicines.  A medicine to increase neutrophil production (colony-stimulating factor). You may get this drug through an IV tube or by injection.  Steroids given through an IV tube.  If an underlying condition is causing neutropenia, you may need treatment for that condition. If medicines you are taking are causing neutropenia, your health care provider may have you stop taking those medicines. Follow these instructions at home: Medicines  Take over-the-counter and prescription medicines only as told by your health care provider.  Get a seasonal flu shot (influenza vaccine). Lifestyle  Do not eat unpasteurized foods.Do not eat unwashed raw fruits or vegetables.  Avoid exposure to groups of people or children.  Avoid being around people who are sick.  Avoid being around dirt or dust, such as in construction areas or gardens.  Do not provide direct care for pets. Avoid animal droppings. Do not clean litter boxes and bird cages. Hygiene   Bathe daily.  Clean the area between the genitals and the anus (perineal area) after you urinate or have a bowel movement. If you are male, wipe from front to back.  Brush your teeth with a soft toothbrush before and after meals.  Do not use a razor that has a blade. Use an electric razor to remove hair.  Wash your hands often. Make sure others who come in contact with you also wash their hands. If soap and water   water are not available, use hand sanitizer. General instructions  Do not have sex unless your health care provider has approved.  Take actions to avoid cuts and burns. For example: ? Be cautious when you use knives. Always cut away from yourself. ? Keep knives in protective sheaths  or guards when not in use. ? Use oven mitts when you cook with a hot stove, oven, or grill. ? Stand a safe distance away from open fires.  Avoid people who received a vaccine in the past 30 days if that vaccine contained a live version of the germ (live vaccine). You should not get a live vaccine. Common live vaccines are varicella, measles, mumps, and rubella.  Do not share food utensils.  Do not use tampons, enemas, or rectal suppositories unless your health care provider has approved.  Keep all appointments as told by your health care provider. This is important. Contact a health care provider if:  You have a fever.  You have chills or you start to shake.  You have: ? A sore throat. ? A warm, red, or tender area on your skin. ? A cough. ? Frequent or painful urination. ? Vaginal discharge or itching.  You develop: ? Sores in your mouth or anus. ? Swollen lymph nodes. ? Red streaks on the skin. ? A rash.  You feel: ? Nauseous or you vomit. ? Very fatigued. ? Short of breath. This information is not intended to replace advice given to you by your health care provider. Make sure you discuss any questions you have with your health care provider. Document Released: 05/21/2002 Document Revised: 05/06/2016 Document Reviewed: 06/11/2015 Elsevier Interactive Patient Education  [employer].

## 2017-06-28 NOTE — Patient Instructions (Signed)

## 2017-06-29 ENCOUNTER — Encounter: Payer: Self-pay | Admitting: Cardiology

## 2017-06-29 ENCOUNTER — Encounter: Payer: Self-pay | Admitting: *Deleted

## 2017-06-29 ENCOUNTER — Ambulatory Visit (INDEPENDENT_AMBULATORY_CARE_PROVIDER_SITE_OTHER): Payer: Medicare Other | Admitting: Cardiology

## 2017-06-29 VITALS — BP 134/84 | HR 123 | Ht 71.0 in | Wt 238.0 lb

## 2017-06-29 DIAGNOSIS — I480 Paroxysmal atrial fibrillation: Secondary | ICD-10-CM

## 2017-06-29 DIAGNOSIS — I5042 Chronic combined systolic (congestive) and diastolic (congestive) heart failure: Secondary | ICD-10-CM | POA: Diagnosis not present

## 2017-06-29 DIAGNOSIS — I2510 Atherosclerotic heart disease of native coronary artery without angina pectoris: Secondary | ICD-10-CM

## 2017-06-29 DIAGNOSIS — I10 Essential (primary) hypertension: Secondary | ICD-10-CM

## 2017-06-29 MED ORDER — APIXABAN 5 MG PO TABS: 5.0000 mg | ORAL_TABLET | Freq: Two times a day (BID) | ORAL | 6 refills | Status: AC

## 2017-06-29 MED ORDER — TORSEMIDE 20 MG PO TABS: 30.0000 mg | ORAL_TABLET | Freq: Every day | ORAL | 3 refills | Status: AC

## 2017-06-29 NOTE — Addendum Note (Signed)
Addended by: Cristopher Estimable on: 06/29/2017 02:23 PM   Modules accepted: Orders, SmartSet

## 2017-06-29 NOTE — Patient Instructions (Addendum)
Medication Instructions:   START ELIQUIS 5 MG ONE TABLET TWICE DAILY  INCREASE DEMADEX TO 30 MG ONCE DAILY= 1 AND 1/2 TABLETS ONCE DAILY  INCREASE AMIODARONE TO 200 MG TWICE DAILY X ONE WEEK THEN DECREASE TO ONE TABLET ONCE DAILY  Labwork:  Your physician recommends that you return for lab work in: [place]  Testing/Procedures:  Your physician has requested that you have a TEE/Cardioversion. During a TEE, sound waves are used to create images of your heart. It provides your doctor with information about the size and shape of your heart and how well your heart's chambers and valves are working. In this test, a transducer is attached to the end of a flexible tube that is guided down you throat and into your esophagus (the tube leading from your mouth to your stomach) to get a more detailed image of your heart. Once the TEE has determined that a blood clot is not present, the cardioversion begins. Electrical Cardioversion uses a jolt of electricity to your heart either through paddles or wired patches attached to your chest. This is a controlled, usually prescheduled, procedure. This procedure is done at the hospital and you are not awake during the procedure. You usually go home the day of the procedure. Please see the instruction sheet given to you today for more information.    Follow-Up:  Your physician recommends that you schedule a follow-up appointment in: [place] physician recommends that you schedule a follow-up appointment in: [place]

## 2017-06-30 ENCOUNTER — Telehealth: Payer: Self-pay | Admitting: Cardiology

## 2017-07-05 ENCOUNTER — Ambulatory Visit: Payer: Medicare Other

## 2017-07-05 ENCOUNTER — Ambulatory Visit: Payer: Medicare Other | Admitting: Hematology and Oncology

## 2017-07-05 ENCOUNTER — Other Ambulatory Visit: Payer: Medicare Other

## 2017-07-06 ENCOUNTER — Ambulatory Visit (HOSPITAL_COMMUNITY): Admit: 2017-07-06 | Payer: Medicare Other | Admitting: Internal Medicine

## 2017-07-06 SURGERY — CARDIOVERSION
Anesthesia: General

## 2017-07-13 DIAGNOSIS — 419620001 Death: Secondary | SNOMED CT | POA: Diagnosis not present

## 2017-07-13 NOTE — Telephone Encounter (Signed)
July 25, 2017 Received death certificate on patient in person from [facility] Service to be signed, given to Dr. [name].  He signed death certificate and I made copy for our records then it was given to representative of [facility].

## 2017-07-13 DEATH — deceased

## 2017-07-14 ENCOUNTER — Ambulatory Visit: Payer: Medicare Other | Admitting: Physician Assistant

## 2017-07-27 ENCOUNTER — Other Ambulatory Visit: Payer: Self-pay | Admitting: Nurse Practitioner

## 2017-08-24 ENCOUNTER — Ambulatory Visit: Payer: Medicare Other | Admitting: Cardiology

## 2017-09-24 IMAGING — CT CT HEAD W/O CM
4 series · 16 of 47 positions shown, 18 images · non-contrast
Comparison: CT head without contrast 04/30/2017

CLINICAL DATA: Dizziness.  Multiple falls.

EXAM:
CT HEAD WITHOUT CONTRAST
TECHNIQUE: Contiguous axial images were obtained from the base of the skull
through the vertex without intravenous contrast.

[Series 3: head wo · axial · 0.44mm/px · z∈[-648,-528]mm · 7 of 33 slices shown, 9 images]
[im 5/33  brain]
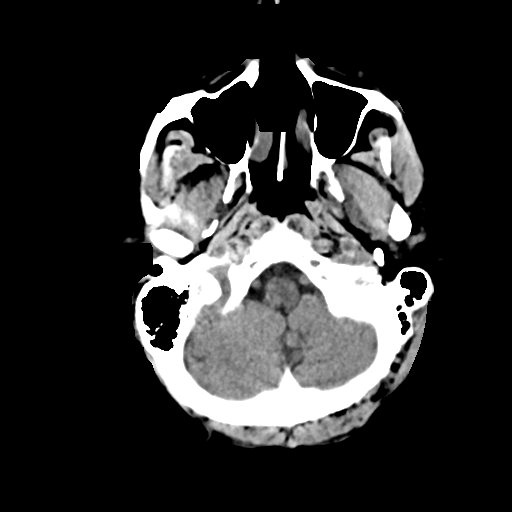
[im 5/33  bone]
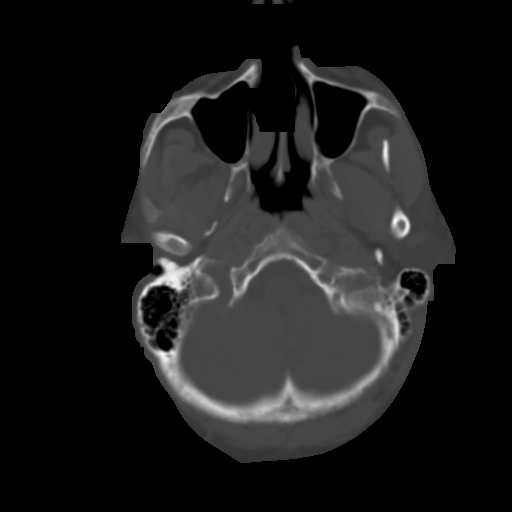
[im 9/33  brain]
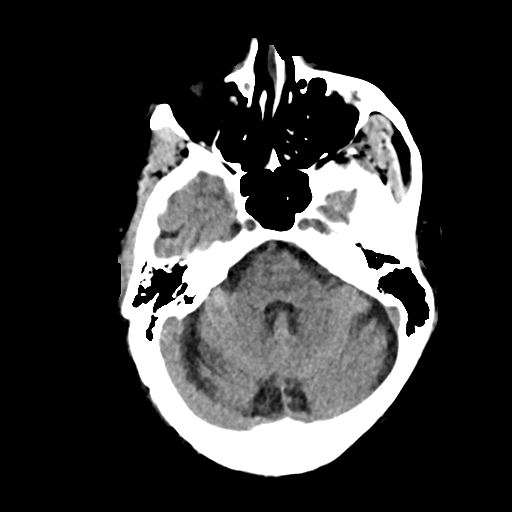
[im 13/33  brain]
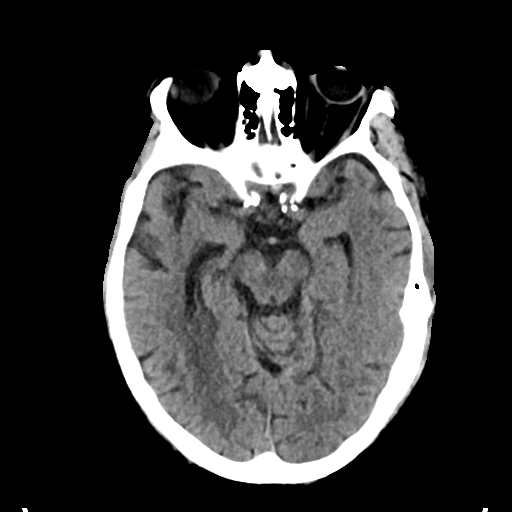
[im 17/33  brain]
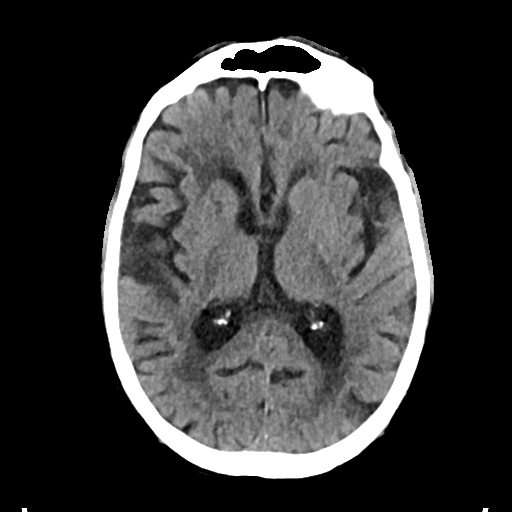
[im 21/33  brain]
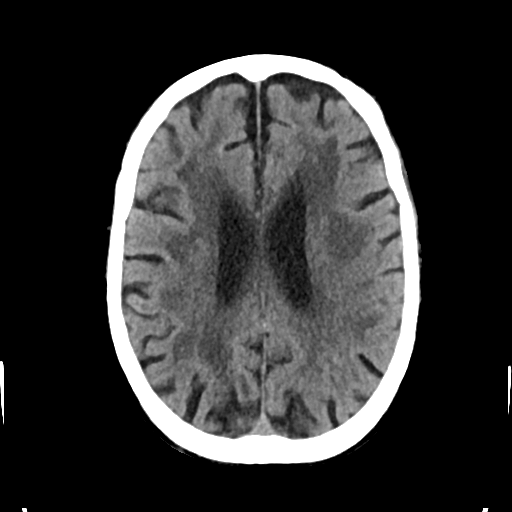
[im 21/33  bone]
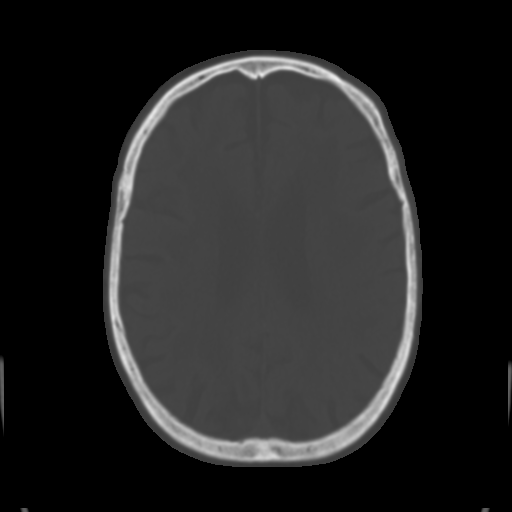
[im 25/33  brain]
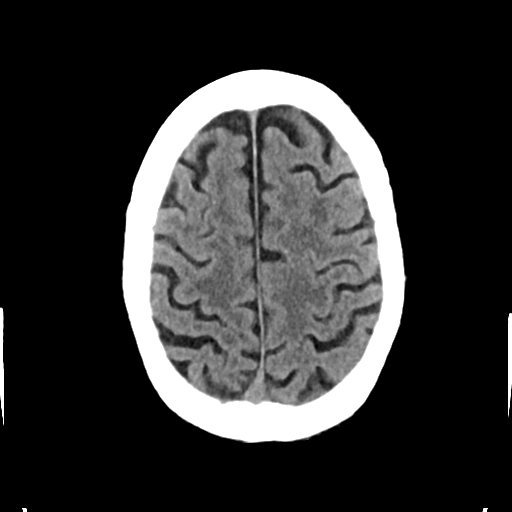
[im 29/33  brain]
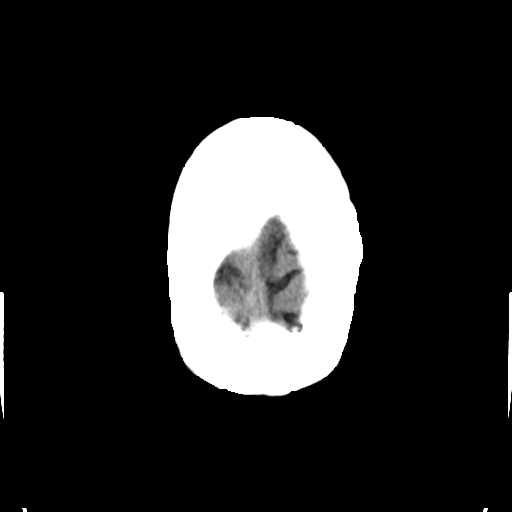

[Series 4: head bone · axial · 0.44mm/px · z∈[-652,-620]mm · 3 of 83 slices shown]
[im 9/83  bone]
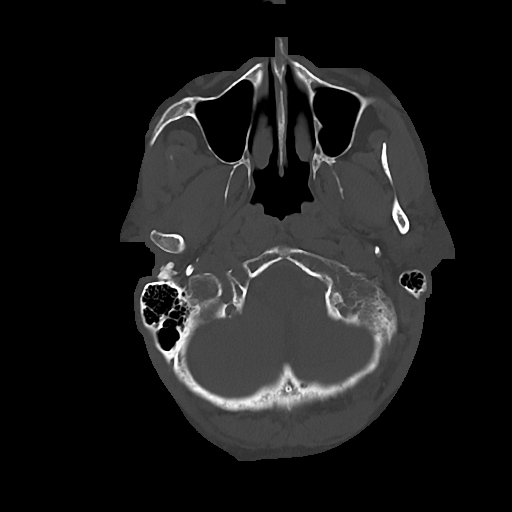
[im 17/83  bone]
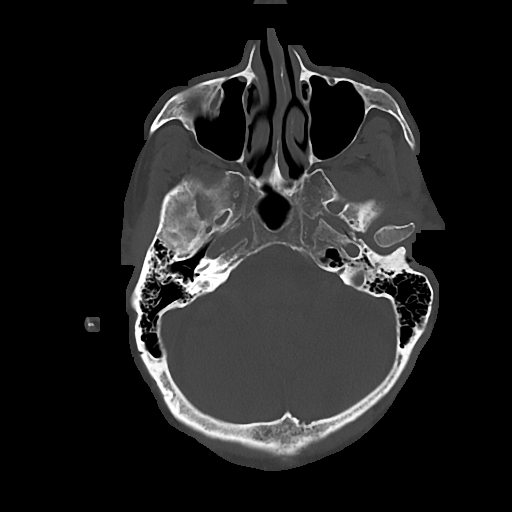
[im 25/83  bone]
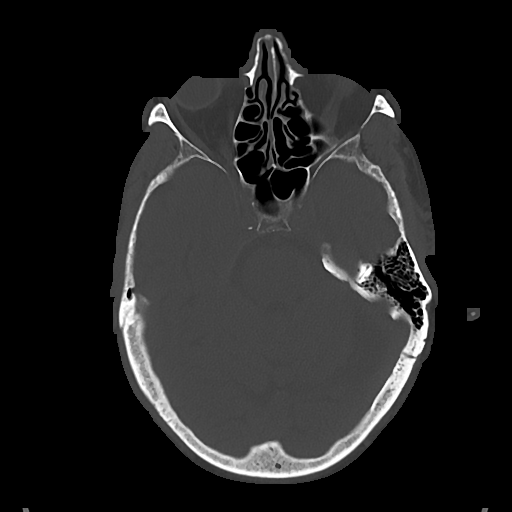

[Series 5: cor soft · coronal · 0.32mm/px · 3 of 67 slices shown]
[im 23/67  brain]
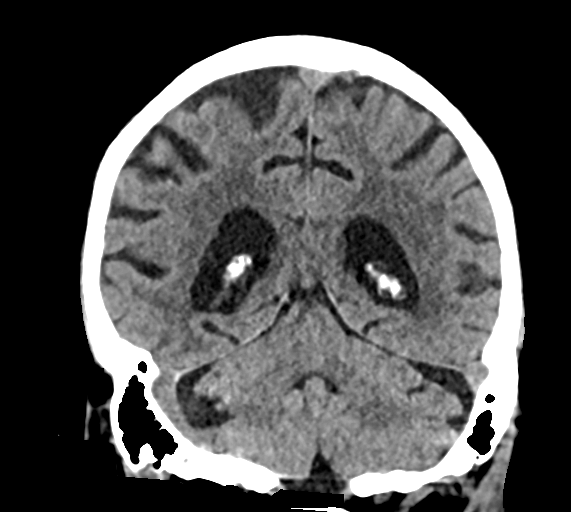
[im 30/67  brain]
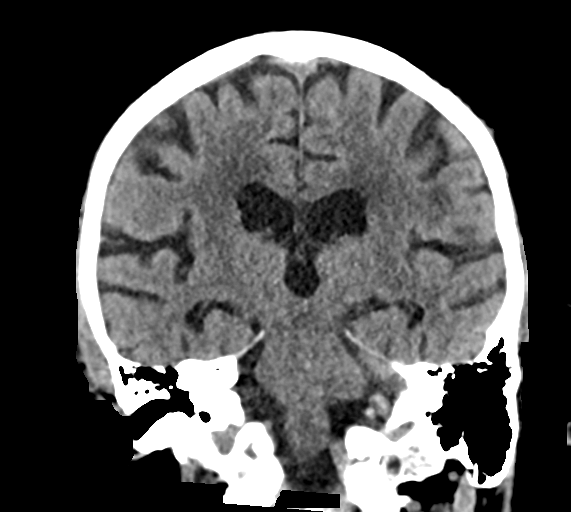
[im 37/67  brain]
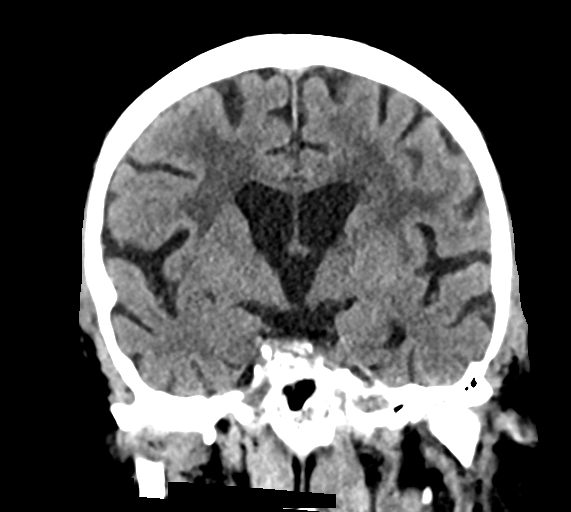

[Series 6: sag soft · sagittal · 0.32mm/px · 3 of 55 slices shown]
[im 19/55  brain]
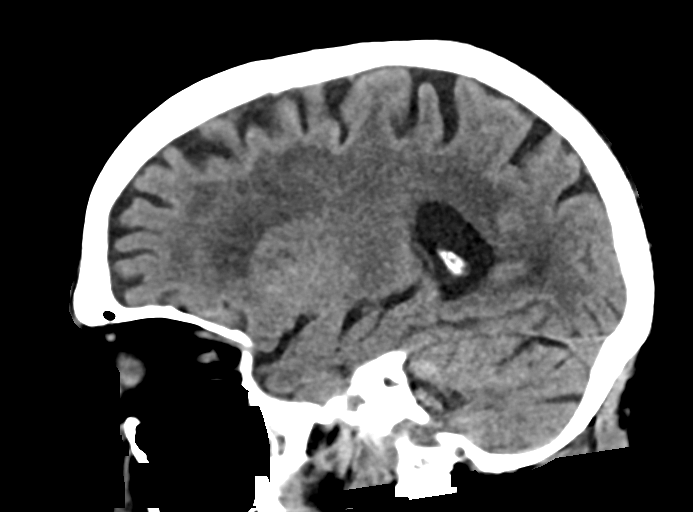
[im 28/55  brain]
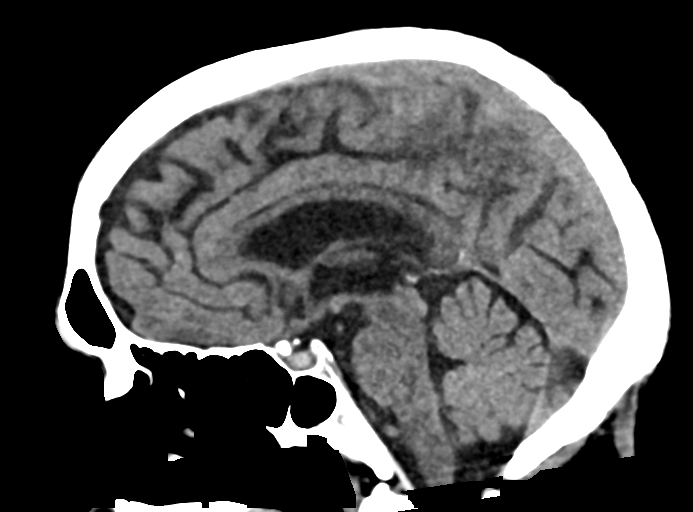
[im 37/55  brain]
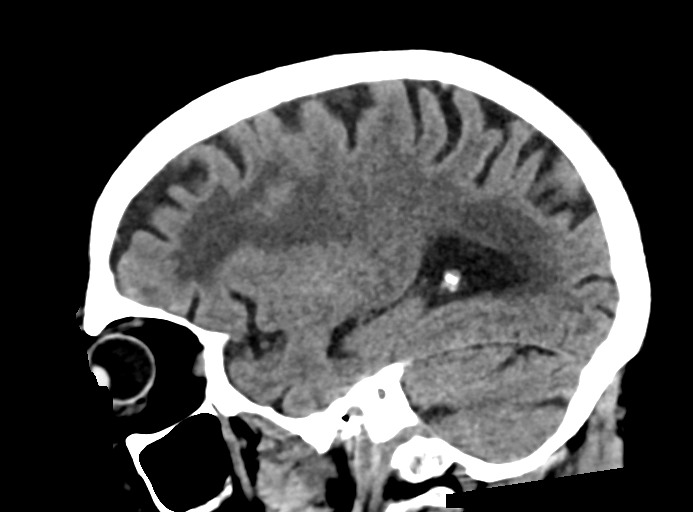

[16 of 47 positions shown; findings below may reference images not displayed]

FINDINGS: Brain: A remote posteromedial right parietal lobe infarct is again
seen. Extensive white matter disease is unchanged. The basal ganglia
are otherwise intact. No acute cortical infarct is present. The
moderate generalized atrophy is stable. The ventricles are
proportionate to the degree of atrophy. No significant extra-axial
fluid collection is present.

Vascular: Atherosclerotic calcifications are present within the
cavernous internal carotid artery's bilaterally. There is no
hyperdense vessel.

Skull: The calvarium is intact. No focal lytic or blastic lesions
are present.

Sinuses/Orbits: The paranasal sinuses and mastoid air cells are
clear. No focal lytic or blastic lesions are present. The globes and
orbits are within normal limits.
IMPRESSION: 1. Acute intracranial abnormality or significant interval change.
2. Remote infarct involving the posteromedial right parietal lobe.
3. Stable extensive atrophy and white matter disease, likely
reflecting the sequela of chronic microvascular ischemia.

## 2018-02-27 ENCOUNTER — Telehealth: Payer: Self-pay | Admitting: *Deleted

## 2018-02-27 NOTE — Telephone Encounter (Signed)
Received Physician Orders from [place]; forwarded to provider/SLS 03/18

## 2018-03-01 NOTE — Telephone Encounter (Signed)
Orders signed (these are for postdated orders from 04/2017) and faxed to [facility] at [phone]. Forms sent for scanning.
